# Patient Record
Sex: Male | Born: 1940 | Race: White | Hispanic: No | Marital: Married | Smoking: Former smoker
Health system: Southern US, Community
[De-identification: ages and names within clinical notes are randomized; demographics above are authoritative.]

## PROBLEM LIST (undated history)

## (undated) DIAGNOSIS — D649 Anemia, unspecified: Secondary | ICD-10-CM

## (undated) DIAGNOSIS — Z9289 Personal history of other medical treatment: Secondary | ICD-10-CM

## (undated) DIAGNOSIS — E119 Type 2 diabetes mellitus without complications: Secondary | ICD-10-CM

## (undated) DIAGNOSIS — I4891 Unspecified atrial fibrillation: Secondary | ICD-10-CM

## (undated) DIAGNOSIS — E871 Hypo-osmolality and hyponatremia: Secondary | ICD-10-CM

## (undated) DIAGNOSIS — M1990 Unspecified osteoarthritis, unspecified site: Secondary | ICD-10-CM

## (undated) DIAGNOSIS — I219 Acute myocardial infarction, unspecified: Secondary | ICD-10-CM

## (undated) DIAGNOSIS — I10 Essential (primary) hypertension: Secondary | ICD-10-CM

## (undated) DIAGNOSIS — I2119 ST elevation (STEMI) myocardial infarction involving other coronary artery of inferior wall: Secondary | ICD-10-CM

## (undated) DIAGNOSIS — I5033 Acute on chronic diastolic (congestive) heart failure: Secondary | ICD-10-CM

## (undated) DIAGNOSIS — I2510 Atherosclerotic heart disease of native coronary artery without angina pectoris: Secondary | ICD-10-CM

## (undated) DIAGNOSIS — E785 Hyperlipidemia, unspecified: Secondary | ICD-10-CM

## (undated) DIAGNOSIS — I739 Peripheral vascular disease, unspecified: Secondary | ICD-10-CM

## (undated) DIAGNOSIS — N400 Enlarged prostate without lower urinary tract symptoms: Secondary | ICD-10-CM

## (undated) HISTORY — PX: CORONARY ARTERY BYPASS GRAFT: SHX141

## (undated) HISTORY — PX: MELANOMA EXCISION: SHX5266

## (undated) HISTORY — PX: CATARACT EXTRACTION W/ INTRAOCULAR LENS  IMPLANT, BILATERAL: SHX1307

## (undated) HISTORY — PX: ROTATOR CUFF REPAIR: SHX139

## (undated) HISTORY — DX: Hypo-osmolality and hyponatremia: E87.1

## (undated) HISTORY — PX: CORONARY ANGIOPLASTY WITH STENT PLACEMENT: SHX49

## (undated) HISTORY — DX: Essential (primary) hypertension: I10

## (undated) HISTORY — DX: Atherosclerotic heart disease of native coronary artery without angina pectoris: I25.10

## (undated) HISTORY — DX: Hyperlipidemia, unspecified: E78.5

## (undated) HISTORY — DX: Unspecified atrial fibrillation: I48.91

## (undated) HISTORY — PX: TONSILLECTOMY: SUR1361

## (undated) HISTORY — DX: Peripheral vascular disease, unspecified: I73.9

---

## 2000-09-04 ENCOUNTER — Ambulatory Visit (HOSPITAL_COMMUNITY): Admission: RE | Admit: 2000-09-04 | Discharge: 2000-09-04 | Payer: Self-pay | Admitting: Cardiology

## 2003-08-26 ENCOUNTER — Encounter (HOSPITAL_COMMUNITY): Admission: RE | Admit: 2003-08-26 | Discharge: 2003-09-25 | Payer: Self-pay | Admitting: Cardiology

## 2003-09-26 ENCOUNTER — Inpatient Hospital Stay (HOSPITAL_BASED_OUTPATIENT_CLINIC_OR_DEPARTMENT_OTHER): Admission: RE | Admit: 2003-09-26 | Discharge: 2003-09-26 | Payer: Self-pay | Admitting: Cardiology

## 2003-10-01 ENCOUNTER — Encounter (HOSPITAL_COMMUNITY): Admission: RE | Admit: 2003-10-01 | Discharge: 2003-10-31 | Payer: Self-pay | Admitting: Cardiology

## 2003-11-03 ENCOUNTER — Encounter (HOSPITAL_COMMUNITY): Admission: RE | Admit: 2003-11-03 | Discharge: 2003-12-03 | Payer: Self-pay | Admitting: Cardiology

## 2003-11-24 ENCOUNTER — Inpatient Hospital Stay (HOSPITAL_COMMUNITY): Admission: RE | Admit: 2003-11-24 | Discharge: 2003-11-28 | Payer: Self-pay | Admitting: Surgery

## 2003-12-29 ENCOUNTER — Encounter (HOSPITAL_COMMUNITY): Admission: RE | Admit: 2003-12-29 | Discharge: 2004-01-28 | Payer: Self-pay | Admitting: Cardiology

## 2004-01-30 ENCOUNTER — Encounter (HOSPITAL_COMMUNITY): Admission: RE | Admit: 2004-01-30 | Discharge: 2004-02-29 | Payer: Self-pay | Admitting: Cardiology

## 2004-03-01 ENCOUNTER — Encounter (HOSPITAL_COMMUNITY): Admission: RE | Admit: 2004-03-01 | Discharge: 2004-03-27 | Payer: Self-pay | Admitting: Cardiology

## 2004-03-31 ENCOUNTER — Encounter (HOSPITAL_COMMUNITY): Admission: RE | Admit: 2004-03-31 | Discharge: 2004-04-30 | Payer: Self-pay | Admitting: Cardiology

## 2004-05-03 ENCOUNTER — Encounter (HOSPITAL_COMMUNITY): Admission: RE | Admit: 2004-05-03 | Discharge: 2004-06-02 | Payer: Self-pay | Admitting: Cardiology

## 2004-06-04 ENCOUNTER — Encounter (HOSPITAL_COMMUNITY): Admission: RE | Admit: 2004-06-04 | Discharge: 2004-07-04 | Payer: Self-pay | Admitting: Cardiology

## 2009-07-22 ENCOUNTER — Ambulatory Visit (HOSPITAL_COMMUNITY): Admission: RE | Admit: 2009-07-22 | Discharge: 2009-07-22 | Payer: Self-pay | Admitting: General Surgery

## 2009-07-30 ENCOUNTER — Ambulatory Visit: Payer: Self-pay | Admitting: Oncology

## 2009-11-11 ENCOUNTER — Ambulatory Visit: Payer: Self-pay | Admitting: Cardiology

## 2010-02-04 ENCOUNTER — Ambulatory Visit: Payer: Self-pay | Admitting: Oncology

## 2010-05-21 ENCOUNTER — Ambulatory Visit (INDEPENDENT_AMBULATORY_CARE_PROVIDER_SITE_OTHER): Payer: 59 | Admitting: Cardiology

## 2010-05-21 DIAGNOSIS — E119 Type 2 diabetes mellitus without complications: Secondary | ICD-10-CM

## 2010-05-21 DIAGNOSIS — I10 Essential (primary) hypertension: Secondary | ICD-10-CM

## 2010-05-21 DIAGNOSIS — E7800 Pure hypercholesterolemia, unspecified: Secondary | ICD-10-CM

## 2010-05-21 DIAGNOSIS — I2510 Atherosclerotic heart disease of native coronary artery without angina pectoris: Secondary | ICD-10-CM

## 2010-05-27 ENCOUNTER — Other Ambulatory Visit: Payer: Self-pay | Admitting: Dermatology

## 2010-06-10 ENCOUNTER — Other Ambulatory Visit (INDEPENDENT_AMBULATORY_CARE_PROVIDER_SITE_OTHER): Payer: 59

## 2010-06-10 DIAGNOSIS — E789 Disorder of lipoprotein metabolism, unspecified: Secondary | ICD-10-CM

## 2010-06-15 LAB — GLUCOSE, CAPILLARY
Glucose-Capillary: 170 mg/dL — ABNORMAL HIGH (ref 70–99)
Glucose-Capillary: 178 mg/dL — ABNORMAL HIGH (ref 70–99)

## 2010-06-15 LAB — BASIC METABOLIC PANEL
BUN: 18 mg/dL (ref 6–23)
Calcium: 9.5 mg/dL (ref 8.4–10.5)
GFR calc Af Amer: >60 mL/min (ref >60)
GFR calc non Af Amer: >60 mL/min (ref >60)
Potassium: 4.4 mEq/L (ref 3.5–5.1)

## 2010-06-15 LAB — CBC
HCT: 34 % — ABNORMAL LOW (ref 39.0–52.0)
MCHC: 35.8 g/dL (ref 30.0–36.0)
Platelets: 164 10*3/uL (ref 150–400)

## 2010-07-28 ENCOUNTER — Other Ambulatory Visit: Payer: Self-pay | Admitting: Cardiology

## 2010-07-28 NOTE — Telephone Encounter (Signed)
Medication Refill

## 2010-08-13 NOTE — Cardiovascular Report (Signed)
[name], Kyle NO.:  [id number]   MEDICAL RECORD NO.:  [id number]                   PATIENT TYPE:  OIB   LOCATION:  6598                                 FACILITY:  [facility]   PHYSICIAN:  Peter M. Swaziland, M.D.               DATE OF BIRTH:  30-Oct-1940   DATE OF PROCEDURE:  09/26/2003  DATE OF DISCHARGE:  09/26/2003                              CARDIAC CATHETERIZATION   INDICATION FOR PROCEDURE:  Mr. Staebell is a 70 year old white male who is  status post coronary artery bypass graft 18 years ago. He had previous  documented occlusion of vein graft, sequentially from the diagonal to the  first marginal branch.  He also had had occluded vein graft to the right  coronary artery.  He recently presented in [place], [place] with an  acute myocardial infarction in cardiogenic shock related to left main  occlusion.  The LMA graft to the LAD was still patent.  His left main was  emergently stented with subsequent improvement and stabilization.  The left  main coronary artery __________ 2 diagonal branches which subsequently  provide collateral support to the circumflex and right coronary artery.  The  patient is now seen for follow up.   ACCESS:  Access is via the right femoral artery using standard Seldinger  technique.   EQUIPMENT USED:  A 4 French, 3.5 cm left Judkins catheter, a 4 cm right  Judkins catheter, and a pigtail catheter.   CONTRAST:  150 cc of Omnipaque.   HEMODYNAMIC DATA:  1. Aortic pressure is 133/60 with a mean of 88 mmHg.  2. Left ventricle pressure is 133 with an EDP of 19 mmHg.   ANGIOGRAPHIC DATA:  1. The left coronary artery arises and distributes normally.  2. The left main coronary artery has a stent in place and is widely patent.  3. The left anterior descending artery has an 80-90% stenosis in the     proximal vessel between the first and second diagonal branches.  It has     been occluded in the midvessel.  The  mid-to-distal LAD filled by the IMA     graft.  The first diagonal has a 90% stenosis at the ostium.  4. The left circumflex coronary artery is occluded proximally.  There is a     large first marginal vessel that fills by left collaterals.  There was 2     small, distal marginal vessels that fill by right-to-left collaterals.  5. The right coronary artery is occluded proximally.  The mid-to-distal     right coronary artery fills by left-to-right collaterals.  6. The LMA graft to the LAD is widely patent with excellent distal run off.  7. We did not attempt to locate the previous vein grafts as these are     already documented to be occluded.   LEFT VENTRICULAR ANGIOGRAPHY:  Left ventricular angiography was performed  in  the RAO and LAO cranial views.  This demonstrates normal left ventricular  size. There is mild proximal anterior hypokinesia.  There is severe mid-to-  basal inferior wall hypokinesia and moderate focal lateral wall hypokinesia.  The overall ejection fraction is estimated at 35-40%.  There is no  significant mitral insufficiency.   FINAL INTERPRETATION:  1. Severe 3-vessel obstructive atherosclerotic coronary artery disease.  2. Patent left main coronary artery (LMA) graft to the left anterior     descending artery (LAD).  3. Patent stent to the left main coronary artery.  4. Moderate left ventricular dysfunction.   PLAN:  There is still significant obstructive disease subtending the 2  diagonal branches which also, in turn, supply collaterals to the left  circumflex and right coronary artery.  These vessels appear to have good  collateral flow and appear to be of suitable size for grafting.  For long-  term management I would recommend redo CABG.                                               Peter M. Swaziland, M.D.    PMJ/MEDQ  D:  09/26/2003  T:  09/28/2003  Job:  [id number]   cc:   Evelene Croon, M.D.  [address]  [place]  [place] [address]  Fax: [phone]

## 2010-08-13 NOTE — Op Note (Signed)
[name], [name]                          ACCOUNT NO.:  [id number]   MEDICAL RECORD NO.:  [id number]                   PATIENT TYPE:  INP   LOCATION:  2305                                 FACILITY:  [facility]   PHYSICIAN:  Evelene Croon, M.D.                  DATE OF BIRTH:  January 15, 1941   DATE OF PROCEDURE:  11/24/2003  DATE OF DISCHARGE:                                 OPERATIVE REPORT   PREOPERATIVE DIAGNOSIS:  Severe native three vessel coronary artery disease  with saphenous vein graft occlusion, status post coronary artery bypass  graft surgery in 1987.   POSTOPERATIVE DIAGNOSIS:  Severe native three vessel coronary artery disease  with saphenous vein graft occlusion, status post coronary artery bypass  graft surgery in 1987.   PROCEDURE:  Redo median sternotomy, extracorporeal circulation.  Redo  coronary artery bypass graft surgery x3 using a free right internal mammary  artery graft to the obtuse marginal branch of the left circumflex coronary  artery, with saphenous vein graft to diagonal branch of the LAD and  saphenous vein graft to posterior descending coronary artery.  Endoscopic  vein harvesting from the right leg.   SURGEON:  Evelene Croon, M.D.   ASSISTANT:  Coral Ceo, P.A.C.   ANESTHESIA:  General endotracheal anesthesia.   CLINICAL HISTORY:  This patient is a 70 year old gentleman well-hydrated  underwent four vessel coronary bypass by Dr. Rhea Bleacher in [place], [place], in February of 1987.  He has had previous documented occlusion of  a sequential vein graft to the diagonal and first marginal branch.  There  was a patent segment between the diagonal and marginal.  He also had an  occluded vein graft to the right coronary artery.  He was treated in  [place], [place], in early May of 2005, with an acute myocardial  infarction in cardiogenic shock and was found to have a left main occlusion.  His left internal mammary artery graft to the  LAD was patent.  He was  treated emergently with stenting of his left main coronary artery with  improvement and stabilization. This did restore flow to diseased diagonals  and provided collateral flow to the left circumflex and right coronary  arteries.  At the time of his myocardial infarction, he had severe left  ventricular dysfunction with an EF of 30% with akinesis of the  posterolateral wall and inferior wall.  There was 2 to 3+ moderate to severe  mitral regurgitation and 1+ tricuspid regurgitation.  There was no aortic  valve disease.  He is subsequently improved markedly.  He underwent repeat  cardiac catheterization on September 26, 2003.  This showed a patent stent in the  left main coronary artery.  The LAD had 80 to 90% proximal stenosis between  the first and second diagonal branches and an occlusion of the mid vessel.  The mid to distal LAD filled by the  left internal mammary artery graft.  The  first diagonal had about 90% osteal stenosis.  The left circumflex was  occluded proximally and a large marginal filling by collaterals.  The right  coronary artery was occluded proximally with the mid to distal vessel  filling by left-to-right collaterals.  There was no gradient across the  aortic valve and no significant mitral regurgitation.  End diastolic  pressure was 19. Ejection fraction was 35 to 40% with severe mid to basilar  inferior wall hypokinesis and moderate lateral wall hypokinesis.  After  review of the angiogram and examination of the patient, it was felt that  redo coronary artery bypass graft surgery was the best treatment.  I  discussed the operative procedure with the patient including alternatives,  benefits, and risks, including bleeding, blood transfusion, infection,  stroke, myocardial infarction, graft failure and death.  He understood and  agreed to proceed.   DESCRIPTION OF PROCEDURE:  The patient was taken to the operating room and  placed on the table in  the supine position.  After induction of general  endotracheal anesthesia, a Foley catheter was placed in the bladder using  sterile technique.  Then the chest, abdomen and both lower extremities were  prepped and draped in the usual sterile manner.  The chest was entered  through a median sternotomy incision.  The sternum was opened using the  oscillating saw without difficulty.  Upon opening the sternum, it was  apparent that the left internal mammary artery graft swung over almost to  the midline and was lying immediately under the edge of the divided left  side of the sternum.  It was not injured on sternotomy.  Care was taken to  protect the left mammary artery.   Then the right internal mammary artery graft was harvested as a free graft.  This was a medium caliber vessel with excellent blood flow through it.  At  the same time, a segment of greater segment saphenous vein was harvested  from the right leg using endoscopic vein harvest technique.  This vein was a  medium size and good quality.  Then dissection was performed to expose the  right atrium and ascending aorta.  The patient was heparinized and when an  adequate activated clotting time was achieved, the distal ascending aorta  was cannulated using a 20 French aortic cannula for arterial inflow.  Venous  outflow was achieved using a two-stage venous cannula for the right atrial  appendage.  Antegrade cardioplegia and vent cannula was inserted in the  aortic root.  A retrograde cardioplegia cannula was inserted through the  right atrium and the coronary sinus.   The patient was placed on cardiopulmonary bypass and the remainder of the  heart was dissected from the pericardium.  He had moderately dense  adhesions.  The left internal mammary pedicle was identified as it entered  the pericardial cavity.  It was traced backwards up to the sternum.   Then the aorta was crossclamped and 500 mL of cold blood antegrade cardioplegia  was administered in the aortic root with quick arrest of the  heart.  An atraumatic clamp was placed across an aortic pedicle.  This was  followed by 300 mL of cold blood retrograde cardioplegia.  Systemic  hypothermia to 20 degrees C and topical hypothermia __________ was used.  A  temperature probe was placed in the septum and insulating pad in the  pericardium.  The coronary arteries were then identified.  The obtuse  marginal branch was located just beyond the previous vein graft distal  anastomosis.  This vessel was graftable but was relatively small and had  some distal disease in it.  It was lying deep in a trough of epicardial fat  which made it more difficult to expose.  The first diagonal branch was also  located and was small but graftable vessel.  Again, this was lying in a  trough of epicardial fat that was difficult to expose.  The posterior  descending artery was also visualized and was a small to medium size vessel  and suitable for grafting.   Then the first distal anastomosis was performed to the posterior descending  coronary artery.  The internal diameter of this vessel was about 1.6 mm.  The conduit used was the segment of greater saphenous vein.  The anastomosis  performed in end-to-side manner using continuous 7-0 Prolene suture.  Flow  was noticed in the graft and was excellent.   The second distal anastomosis was performed of the obtuse marginal branch.  The internal diameter of this vessel was about 1.6 mm.  Conduit used was the  free right internal mammary artery graft.  This was anastomosis in end-to-  side manner using continuous 8-0 Prolene suture.  The pedicle was tacked to  the epicardium with 6-0 Prolene sutures.  Then the dose of retrograde  cardioplegia was given.   The third distal anastomosis was performed to the diagonal branch.  The  internal diameter was about 1.5 mm.  Conduit used was a second segment of  greater saphenous vein.  The anastomosis  performed in end-to-side manner  using continuous 7-0 Prolene suture.  Flow was noted through the graft and  was good.  Then the two proximal vein graft anastomoses were performed to  the aortic root in end-to-side manner using continuous 6-0 Prolene suture.  The proximal anastomosis of the right internal mammary artery graft was  performed to the proximal portion of the diagonal vein graft in end-to-side  manner using continuous 7-0 Prolene suture.  Then the clamp was removed from  the mammary pedicle.  There was rapid warming of the ventricular septum and  returned spontaneous ventricular fibrillation.  The crossclamp was removed  with time of 124 minutes and the patient spontaneously converted to sinus  rhythm.   The proximal and distal anastomoses appeared hemostatic and alignment of the  graft satisfactory.  Graft markers placed around the proximal anastomosis.  Two temporary right ventricular and right atrial pacing wires placed and  brought out through the skin.  When the patient had rewarmed to 37 degrees C, he was weaned from  cardiopulmonary bypass on low dose Dopamine.  Total bypass time was 168  minutes.  Cardiac function was excellent with cardiac output of about 5  liters per minute.  Protamine was given and venous and aortic cannulas were  removed without difficulty.  Hemostasis was achieved.  Three chest tubes  were placed with a tube in the posterior pericardium and one in the right  pleural space and one in the anterior mediastinum.  The sternum was closed  with #6 stainless steel wires.  Fascia was closed with continuous #1 Vicryl  suture.  Subcutaneous tissue was closed with a continuous 2-0 Vicryl and the  skin with 3-0 Vicryl subcuticular closure.  The lower extremity vein harvest  site was closed in layers in similar manner.  The sponge, needle and  instrument counts were correct according to the scrub nurse.  Dry sterile  dressings were applied over the incisions  around the chest tubes which were  hooked to Pleuravac suction.  The patient remained hemodynamically stable  and was transported to the SICU in guarded but stable condition.                                               Evelene Croon, M.D.    BB/MEDQ  D:  11/24/2003  T:  11/24/2003  Job:  [id number]   cc:   Peter M. Swaziland, M.D.  [address], [address]  [place], [place] [address]  Fax: [phone]   Cardiac Cath Lab

## 2010-08-13 NOTE — H&P (Signed)
NAME:  [name], [name]                          ACCOUNT NO.:  [id number]   MEDICAL RECORD NO.:  [id number]                   PATIENT TYPE:  INP   LOCATION:  NA                                   FACILITY:  [facility]   PHYSICIAN:  Evelene Croon, M.D.                  DATE OF BIRTH:  16-Oct-1940   DATE OF ADMISSION:  11/24/2003  DATE OF DISCHARGE:                                HISTORY & PHYSICAL   HISTORY OF PRESENT ILLNESS:  This is a 70 year old male who was referred to  Dr. Laneta Simmers in consideration for redo coronary artery bypass grafting.  The  patient has a history of a four-vessel coronary artery bypass by Dr.  __________ in [place], [place], in February of 1987.  Previously  there is documentation of an occlusion to the sequential vein graft to the  diagonal and first marginal branch.  There is a patent segment between the  diagonal and the marginal.  He additionally is known to have an occlusion of  the vein graft to his right coronary artery.  In May of 2005, the patient  presented in [place], [place], with an acute myocardial infarction  with cardiogenic shock and studies done at that time revealed an acute left  main occlusion.  His mammary graft to the LAD was patent.  He was stabilized  medically and additionally he underwent a stenting of his left main coronary  artery with improvement.  At the time of his myocardial infarction, an  echocardiogram revealed a significantly impaired left ventricular function  with an ejection fraction of 30% with akinesis of the posterolateral wall  and inferior wall akinesis.  Additionally at that time, 2-3+ moderate to  severe mitral regurgitation and 1+ tricuspid regurgitation were noted.  There was no aortic valve disease.  The patient had a good clinical recovery  and has had no recurrent chest pain.  He does have symptoms of some  shortness of breath with deep lower levels of activity tolerance.  The  patient feels  increasingly fatigued.  In July of 2005, the patient underwent  a redo cardiac catheterization and was found to have a patent stent in the  left main coronary artery.  The LAD had an 80-90% proximal stenosis between  the first and second diagonal branches and then occlusion in the mid vessel.  The mid to distal LAD filled by the left mammary artery graft.  The first  diagonal had about a 90% ostial stenosis.  The circumflex was occluded  proximally.  There was a large first marginal filled by collaterals.  The  right coronary artery was occluded proximally and the mid to distal filled  by left-to-right collaterals, but was relatively small or underfilled.  There was no gradient across his aortic valve.  There was no significant  mitral regurgitation.  End-diastolic pressure was 19.  His ejection fraction  was estimated at  35-40% with severe mid to basilar inferior wall hypokinesis  and moderate lateral wall hypokinesis.  Dr. Laneta Simmers reviewed the patient and  his studies and agreed to proceed with surgical revascularization due to the  increasing symptoms and severity of the anatomical findings.   PAST MEDICAL HISTORY:  1. Coronary artery disease as described.  2. Hypertension.  3. Hypercholesterolemia.  4. Acute myocardial infarction in May of 2005 with cardiogenic shock.  5. Diabetes mellitus, type 2.  6. History of depression diagnosed in 1995 without hospitalization.  7. History of benign prostatic hypertrophy.   PAST SURGICAL HISTORY:  1. Right side rotator cuff repair.  2. Coronary artery bypass grafting x 4 in 1987.  3. Tonsillectomy age 47.   CURRENT MEDICATIONS:  1. Plavix 75 mg daily.  Discontinued on November 17, 2003.  2. Avandia 4 mg b.i.d.  3. Prandin 2 mg t.i.d.  4. Zoloft 100 mg daily.  5. Zocor 40 mg daily.  6. Toprol XL 50 mg daily.  7. NitroQuick p.r.n.  8. Xanax 0.25 mg one to two daily.  9. Aspirin 325 mg daily.   ALLERGIES:  No known drug allergies.    FAMILY HISTORY:  Remarkable for coronary artery disease from his mother.  Also, multiple family members with diabetes and arthritis.   SOCIAL HISTORY:  He is married with no children.  He is a retired Proofreader.  Alcohol use:  None.  Tobacco use:  Approximately 45  pack years.  He quit in 1987.   REVIEW OF SYSTEMS:  GENERAL:  Unremarkable.  ENDOCRINE:  Unremarkable with  the exception of diabetes.  CARDIOVASCULAR:  As described above.  PULMONARY:  He denies symptoms other than mild shortness of breath with exertion.  PERIPHERAL VASCULAR:  He does have leg and buttock pain when walking,  relieved with rest.  NEUROLOGIC:  Nonfocal.  MUSCULOSKELETAL:  Remarkable  for some arthritis symptoms with some pain and swelling in his joints.  PSYCHIATRIC:  He does have a history of depression.  HEMATOLOGICAL:  He does  not have any evidence of easy bruising or bleeding diastasis.   PHYSICAL EXAMINATION:  VITAL SIGNS:  Blood pressure 110/70, pulse 76,  respirations 12.  GENERAL APPEARANCE:  This is a well-developed male in no acute distress.  HEENT:  Normocephalic and atraumatic.  Pupils equal, round, and reactive to  light.  Extraocular movements intact.  The pharynx is clear without exudates  or erythema.  NECK:  Supple.  Carotid pulses are palpable.  There are bilateral faint  bruits versus transmitted murmurs.  No adenopathy or thyromegaly.  CARDIAC:  Regular rate and rhythm with occasional extrasystole.  There is a  2/6 systolic murmur.  No rubs or gallops.  ABDOMEN:  Soft and nontender.  Normoactive bowel sounds.  No masses.  No  bruits.  GENITOURINARY:  Deferred.  RECTAL:  Deferred.  EXTREMITIES:  No clubbing or cyanosis.  There is mild left ankle edema.  SKIN:  Warm.  PERIPHERAL PULSES:  Palpable posterior tibial bilaterally.  NEUROLOGIC:  Nonfocal.  Muscle strength and sensory are grossly intact.  ASSESSMENT:  This is a 70 year old male with recurrent native and  graft  coronary artery disease, status post acute myocardial infarction in May of  2005.  Other diagnoses as previously listed.   PLAN:  Redo surgical revascularization by Dr. Laneta Simmers on November 24, 2003.      Rowe Clack, P.A.-C.  Evelene Croon, M.D.    Sherryll Burger  D:  11/20/2003  T:  11/20/2003  Job:  [id number]   cc:   Peter M. Swaziland, M.D.  [address], [address]  [place], [place] [address]  Fax: [phone]

## 2010-08-13 NOTE — H&P (Signed)
[place]  Patient:    [name], [name]                         MRN: [id number] Proc. Date: 08/31/00 Adm. Date:  09/04/00 Attending:  Peter M. Swaziland, M.D. CC:         Bing Neighbors. Tenny Craw, M.D.                         History and Physical  CHIEF COMPLAINT:  Chest pain.  HISTORY OF PRESENT ILLNESS:  The patient is a 70 year old white male, who has a known history of coronary artery disease.  He is status post coronary artery bypass surgery in February of 1987 by Dr. Rhea Bleacher.  At that time he had a LIMA graft placed to the LAD, a sequential saphenous vein graft to the second diagonal and distal circumflex and a saphenous vein graft to the posterior descending coronary arteries.  He has done very well since that time and has been followed with yearly stress test, at least since 1997.  On these tests he has generally had good exercise tolerance and has been asymptomatic.  He has had some mild ST changes over the years.  On this years examination, the patient did report some increased shortness of breath on exertion and occasional chest pain.  He underwent his yearly stress test and this demonstrated marked ischemia in the anterolateral distribution at 6 minutes on the Bruce protocol.  This was associated with chest pain and dyspnea.  This represents a marked change in both his exercise tolerance and ECG findings since one year ago, and he is now admitted for cardiac catheterization.  The patient does have significant cardiac risk factors including a history of non-insulin dependent diabetes mellitus and hypercholesterolemia.  His diabetes apparently has been poorly controlled.  PAST MEDICAL HISTORY: 1. Non-insulin dependent diabetes mellitus. 2. Hypertension. 3. Hypercholesterolemia. 4. He is status post CABG 15 years ago. 5. He has had prior tonsillectomy. 6. The patient also has had rotator cuff tear on the right. 7. History of BPH. 8. History of  depression.  CURRENT MEDICATIONS: 1. Aspirin daily. 2. Glucotrol 10 mg b.i.d. 3. Pravachol 20 mg daily. 4. Hytrin 5 mg daily. 5. Zoloft 100 mg per day. 6. Nitrostat p.r.n. 7. Avandia 4 mg b.i.d.  ALLERGIES:  He has no known allergies.  SOCIAL HISTORY:  The patient is disabled.  He is married and has 2 stepchildren.  He quit smoking 15 years ago and denies significant alcohol use.  FAMILY HISTORY:  Noncontributory.  REVIEW OF SYSTEMS:  He denies any claudication symptoms.  No history of TIA or stroke.  Denies orthopnea, PND or edema.  All other review of systems are negative.  PHYSICAL EXAMINATION:  GENERAL:  On physical examination, the patient is a white male in no apparent distress.  VITAL SIGNS:  Weight is 186.  Blood pressure is 118/72, pulse 82 and regular.  HEENT:  Pupils, equal, round, reactive to light and accommodation. Extraocular movements are full.  Conjunctivae are clear.  Oropharynx is clear.  NECK:  Supple without JVD, adenopathy, thyromegaly, or bruits.  LUNGS:  Clear to auscultation and percussion.  CARDIAC:  The patient does have soft bilateral carotid bruits, right greater than left.  The patient has normal PMI.  He has a regular rate and rhythm with a grade 2/6 systolic murmur heard best at the left sternal border.  There are  no S3.  ABDOMEN:  Soft and nontender without masses, hepatosplenomegaly or bruits.  PULSES:  Femoral and pedal pulses are 2+ and symmetric.  EXTREMITIES:  He has no edema.  NEUROLOGICAL:  Examination is grossly intact.  LABORATORY DATA:  ECG at rest demonstrates normal sinus rhythm with minor nonspecific ST abnormality.  Chest x-ray shows prior coronary artery bypass surgery with no active disease.  IMPRESSION: 1. Atherosclerotic coronary artery disease with recurrent angina and    early positive stress test. 2. Status post coronary artery bypass graft x4 in 1987. 3. Non-insulin dependent diabetes mellitus, poorly  controlled. 4. Hypercholesterolemia. 5. Hypertension.  PLAN:  The patient will be admitted for cardiac catheterization with further therapy pending these results. DD:  08/31/00 TD:  09/01/00 Job: [id number]

## 2010-08-13 NOTE — Cardiovascular Report (Signed)
[name], [name]                       MRN: [id number] Proc. Date: 09/04/00 Adm. Date:  [id number] Attending:  [place], Peter Manning CC:         Bing Neighbors. Tenny Craw, M.D.   Cardiac Catheterization  INDICATIONS FOR PROCEDURE:  The patient is a 70 year old white male, status post coronary artery bypass surgery 15 years ago.  He has had some recurrent anginal symptoms and has significant change in his stress test this year with increased ST changes at a low level.  ACCESS:  Via the right femoral artery using standard Seldinger technique.  EQUIPMENT:  A 6 French 4 cm right and left Judkins catheter, 6 French pigtail catheter, 6 French arterial sheath.  MEDICATIONS:  Local anesthesia with 1% Xylocaine.  CONTRAST:  Omnipaque 175 cc.  HEMODYNAMIC DATA:  Aortic pressure is 138/71 with a mean of 100.  Left ventricular pressure is 132 with an EDP of 24 mmHg.  ANGIOGRAPHIC DATA:  Left coronary artery:  The left coronary artery arises normally.  Left main:  The left main coronary artery is heavily calcified with diffuse 30% narrowing in the mid to distal left main.  Left anterior descending:  The left anterior descending artery is also heavily calcified.  It gives rise to two diagonal branches proximally and then is occluded.  Left circumflex:  The left circumflex coronary artery is heavily calcified and is occluded proximally.  The first obtuse marginal vessel fills by segmented vein graft which extends from the first diagonal branch to the first marginal vessel.  This also fills retrograde the distal circumflex and second marginal vessel.  The first marginal vessel is diffusely diseased after the vein graft insertion up to 80-90% over a long segment.  The distal circumflex is without significant disease.  The right coronary arises normally and is occluded proximally.  The right coronary artery fills by both right to right and  left to right collaterals. These collaterals come from both the native LAD via IMA graft and via the native circulation by the distal circumflex.  The saphenous vein graft to the first diagonal and first marginal vessel is occluded proximally.  As noted previously, the segment between the first diagonal and first marginal vessel was still patent but the entire proximal segment is occluded.  The saphenous vein graft to the right coronary artery is occluded proximally.  The LIMA graft to the LAD is a large graft which is widely patent and fills the LAD well.  LEFT VENTRICULAR ANGIOGRAPHY:  Left ventricular angiography is performed in the RAO and LAO cranial views.  This demonstrates normal left ventricular size.  There is mild inferobasal hypokinesia with overall well preserved left ventricular function.  Ejection fraction was estimated at 55%.  There is no mitral regurgitation or prolapse.  FINAL INTERPRETATION: 1. Severe three-vessel obstructive atherosclerotic coronary artery disease. 2. Occluded saphenous vein graft to the first diagonal with continued patency    of the graft segment between the first diagonal and first obtuse marginal    vessel filling the distal circumflex. 3. Occluded saphenous vein graft to the right coronary artery. 4. Patent left internal mammary artery graft to left anterior descending. 5. Overall, well preserved left ventricular function.  PLAN:  Would recommended medical therapy at this point.  The patient has poor target vessels for grafting and these areas are well collateralized.  If he has severe refractory angina despite medical therapy, could consider revascularization with a re-do bypass surgery. DD:  09/04/00 TD:  09/04/00 Job: [id number]

## 2010-08-13 NOTE — Discharge Summary (Signed)
[name], [name]                          ACCOUNT NO.:  [id number]   MEDICAL RECORD NO.:  [id number]                   PATIENT TYPE:  INP   LOCATION:  2008                                 FACILITY:  [facility]   PHYSICIAN:  Evelene Croon, M.D.                  DATE OF BIRTH:  04-Nov-1940   DATE OF ADMISSION:  11/24/2003  DATE OF DISCHARGE:  11/28/2003                                 DISCHARGE SUMMARY   PRIMARY ADMITTING DIAGNOSIS:  Recurrent coronary artery disease.   ADDITIONAL/DISCHARGE DIAGNOSES:  1.  Recurrent coronary artery disease, status post previous coronary artery      bypass grafting in 1987.  2.  Hypertension.  3.  Hypercholesterolemia.  4.  Status post myocardial infarction in May 2005 with subsequent      cardiogenic shock.  5.  Type 2 noninsulin-dependent diabetes mellitus.  6.  History of depression.  7.  Benign prostatic hypertrophy.   PROCEDURES PERFORMED:  1.  Redo coronary artery bypass grafting x3 (free right internal mammary      artery to the obtuse marginal, saphenous vein graft to the diagonal,      saphenous vein graft to the posterior descending coronary).  2.  Endoscopic vein harvest, right thigh.   HISTORY:  The patient is a 70 year old male who is status post a previous  coronary artery bypass grafting surgery in 1987 by Dr. Rhea Bleacher.  In early  May of this year he presented to an emergency room in [place], [place], with an acute myocardial infarction as well as cardiogenic shock.  At that time he was felt to have left main occlusion.  The left internal  mammary artery graft to his LAD was still patent.  He was treated emergently  with stenting to his left main coronary with improvement and stabilization.  This restored flow to diseased diagonal vessels and provided collateral flow  to the left circumflex and right coronary arteries.  At that time he was  also noted to have a severe left ventricular dysfunction with an ejection  fraction  by echocardiogram of about 30% with akinesis of the posterolateral  wall and inferior wall akinesis.  There was 2-3+ mitral regurgitation and 1+  tricuspid regurgitation.  Symptomatically he has remained stable since that  time.  He was seen by Peter M. Swaziland, M.D., and underwent repeat cardiac  catheterization on September 26, 2003.  This showed a patent stent in his left  main coronary.  There was an 80-90% proximal stenosis of the LAD between the  first and second diagonals and then occlusion in the midvessel.  The mid- to  distal LAD filled by the left internal mammary artery graft.  The first  diagonal had about a 90% ostial stenosis.  The left circumflex was occluded  proximally.  There was a large first marginal, which filled by collaterals.  The right coronary artery was occluded  proximally and the mid- to vessels  filled by left-to-right collaterals but was relatively small.  There was no  gradient across the aortic valve and no significant mitral regurgitation.  His ejection fraction was estimated at 35-40% with severe mid- to basilar  wall inferior wall hypokinesis and moderate lateral wall hypokinesis.  Because of his significant recurrent coronary artery disease, he was  referred to Dr. Evelene Croon for evaluation for redo surgical  revascularization.  Dr. Laneta Simmers saw the patient in the office and reviewed  his films and agreed that his best course of action would be to proceed with  surgery.  After explanation of the risks, benefits, and alternatives of the  procedure, the patient agreed to proceed.   HOSPITAL COURSE:  He was admitted to [facility] on November 24, 2003.  He was taken to the operating room, where he underwent redo CABG x3  performed by Dr. Laneta Simmers.  Grafts are described in detail above.  He  tolerated the procedure well and was transferred to the SICU in stable  condition.  He was able to be extubated after surgery.  He was  hemodynamically stable and doing  well on postop day 1.  He did have some  bradycardia and required atrial pacing initially.  He was also maintained on  low-dose Neo-Synephrine and dopamine drips.  Over the course of postop day 1  he was weaned from all drips, and these were discontinued.  After he was off  all drips, he was able to be transferred to the floor.  He was able to  maintain normal sinus rhythm with rates in the 80s-90s without pacemaker  back-up.  Therefore, the external pacer was discontinued.  He was started on  a low-dose beta blocker, which he has been tolerating well.  This has been  titrated back up to his home dose of Toprol XL 50 mg daily, and he is  maintaining blood pressures in the 110-120 systolic range with heart rates  in the 80s-90s in normal sinus rhythm.  He has done very well  postoperatively.  He is ambulating in the halls without difficulty.  He has  been weaned off supplemental oxygen and is maintaining O2 saturations of  greater than 90% on room air.  His surgical incision sites are all healing  well.  He is being treated with aggressive pulmonary toilet measures.  He is  only mildly volume-overloaded and is not currently on a diuretic.  His blood  sugars have remained well-controlled on his home medication regimen.  Presently his labs show a mild anemia with hemoglobin of 9.2, hematocrit  26.5, and he has been started on iron supplementation.  His other labs show  a platelet count of 96, white count of 8.8, BUN 23, creatinine 1.1,  potassium 3.9, which has been supplemented.  His most recent chest x-ray  showed bibasilar atelectasis, again which has been treated with aggressive  pulmonary toilet.  He is continuing to make progress, and it is anticipated  that if over the next 24-48 hours he has had no changes in his condition, he  will be ready for discharge home, hopefully on November 28, 2003.   DISCHARGE MEDICATIONS:  1.  Enteric-coated aspirin 325 mg daily. 2.  Toprol XL 50 mg  daily.  3.  Zocor 40 mg q.h.s.  4.  Plavix 75 mg daily.  5.  Avandia 4 mg b.i.d.  6.  Prandin 2 mg t.i.d.  7.  Zoloft 100 mg daily.  8.  Xanax 0.25 mg one to two daily p.r.n.  9.  Nu-Iron 150 mg b.i.d.  10. Tylox one to two q.4h. p.r.n. for pain.   DISCHARGE INSTRUCTIONS:  He is asked to refrain from driving, heavy lifting,  or strenuous activity.  He may continue ambulating daily and using his  incentive spirometer.  He may shower daily and clean his incisions with soap  and water.  He will continue on a low-fat, low-sodium, carbohydrate-modified  diet.   DISCHARGE FOLLOW-UP:  He is asked to make an appointment to see Dr. Swaziland  in two weeks and have a chest x-ray at that visit.  He will then follow up  with Dr. Laneta Simmers on September 20 at 12:15 p.m.  He will bring his chest x-ray  to this visit for Dr. Laneta Simmers to review.  If he experiences any problems or  has questions in the interim, he is asked to contact our office immediately.      Coral Ceo, P.A.                        Evelene Croon, M.D.    GC/MEDQ  D:  11/27/2003  T:  11/29/2003  Job:  [id number]   cc:   C. Duane Lope, M.D.  [address]  [place]  [place] [address]  Fax: [phone]   Peter M. Swaziland, M.D.  [phone]  [place], [place] [address]  Fax: [phone]

## 2010-08-27 ENCOUNTER — Other Ambulatory Visit: Payer: Self-pay | Admitting: Dermatology

## 2010-08-31 ENCOUNTER — Ambulatory Visit (INDEPENDENT_AMBULATORY_CARE_PROVIDER_SITE_OTHER): Payer: 59 | Admitting: Urology

## 2010-08-31 DIAGNOSIS — N529 Male erectile dysfunction, unspecified: Secondary | ICD-10-CM

## 2010-08-31 DIAGNOSIS — N401 Enlarged prostate with lower urinary tract symptoms: Secondary | ICD-10-CM

## 2010-08-31 DIAGNOSIS — N138 Other obstructive and reflux uropathy: Secondary | ICD-10-CM

## 2010-10-26 ENCOUNTER — Encounter (HOSPITAL_BASED_OUTPATIENT_CLINIC_OR_DEPARTMENT_OTHER): Payer: 59 | Admitting: Oncology

## 2010-10-26 DIAGNOSIS — C434 Malignant melanoma of scalp and neck: Secondary | ICD-10-CM

## 2010-11-25 ENCOUNTER — Other Ambulatory Visit: Payer: Self-pay | Admitting: Cardiology

## 2010-11-25 NOTE — Telephone Encounter (Signed)
Fax received from pharmacy. Refill completed. Jodette Briley RN  

## 2011-02-20 ENCOUNTER — Other Ambulatory Visit: Payer: Self-pay | Admitting: Cardiology

## 2011-02-22 ENCOUNTER — Other Ambulatory Visit: Payer: Self-pay | Admitting: Cardiology

## 2011-02-23 ENCOUNTER — Encounter: Payer: Self-pay | Admitting: Cardiology

## 2011-02-23 ENCOUNTER — Ambulatory Visit (INDEPENDENT_AMBULATORY_CARE_PROVIDER_SITE_OTHER): Payer: Medicare Other | Admitting: Cardiology

## 2011-02-23 VITALS — BP 118/60 | HR 66 | Ht 70.0 in | Wt 188.8 lb

## 2011-02-23 DIAGNOSIS — E785 Hyperlipidemia, unspecified: Secondary | ICD-10-CM | POA: Insufficient documentation

## 2011-02-23 DIAGNOSIS — I2510 Atherosclerotic heart disease of native coronary artery without angina pectoris: Secondary | ICD-10-CM

## 2011-02-23 DIAGNOSIS — I10 Essential (primary) hypertension: Secondary | ICD-10-CM | POA: Insufficient documentation

## 2011-02-23 DIAGNOSIS — I252 Old myocardial infarction: Secondary | ICD-10-CM | POA: Insufficient documentation

## 2011-02-23 DIAGNOSIS — E119 Type 2 diabetes mellitus without complications: Secondary | ICD-10-CM

## 2011-02-23 DIAGNOSIS — Z951 Presence of aortocoronary bypass graft: Secondary | ICD-10-CM | POA: Insufficient documentation

## 2011-02-23 NOTE — Assessment & Plan Note (Signed)
He remains asymptomatic from a cardiac standpoint. We will continue on his medical therapy including aspirin, carvedilol, and Plavix. His last nuclear stress test in August of 2010 showed an inferior lateral wall scar with ejection fraction of 53%.

## 2011-02-23 NOTE — Patient Instructions (Signed)
Continue your current medication.  I will get a copy of your lab work from Dr. Tenny Craw.  I will see you again in 6 months.

## 2011-02-23 NOTE — Assessment & Plan Note (Signed)
Lipids are well controlled on a combination of fish oil, niacin, and simvastatin.

## 2011-02-23 NOTE — Progress Notes (Signed)
[name] Date of Birth: [birth year] Medical Record #[id number]  History of Present Illness: [name] is seen today for followup. He has a history of coronary disease and is status post redo coronary bypass surgery in 2005. He has had an old anterior myocardial infarction. He reports that he has been doing very well from a cardiac standpoint. He has had no significant shortness of breath, chest pain, or palpitations. He did recently fall off a ladder and this resulted in significant bruising of his hip and he has had some persistent back pain. This has limited his activity recently.  Current Outpatient Prescriptions on File Prior to Visit  Medication Sig Dispense Refill  . aspirin 81 MG tablet Take 81 mg by mouth daily.        Marland Kitchen buPROPion (WELLBUTRIN XL) 300 MG 24 hr tablet Take 300 mg by mouth daily.        . carvedilol (COREG) 12.5 MG tablet TAKE 1 TABLET TWICE A DAY  180 tablet  2  . fish oil-omega-3 fatty acids 1000 MG capsule Take 2 g by mouth daily.        Marland Kitchen FLUoxetine (PROZAC) 20 MG capsule Take 20 mg by mouth daily.        Marland Kitchen losartan-hydrochlorothiazide (HYZAAR) 100-12.5 MG per tablet Take 1 tablet by mouth daily.        . Multiple Vitamin (MULTIVITAMIN) tablet Take 1 tablet by mouth daily.        . niacin 500 MG tablet Take 500 mg by mouth daily with breakfast.        . NITROSTAT 0.4 MG SL tablet TAKE AS DIRECTED  25 tablet  3  . PLAVIX 75 MG tablet TAKE 1 TABLET EVERY DAY  90 tablet  3  . simvastatin (ZOCOR) 80 MG tablet Take 80 mg by mouth at bedtime.          Not on File  Past Medical History  Diagnosis Date  . CAD (coronary artery disease)   . History of acute anterior wall MI   . Diabetes mellitus   . HTN (hypertension)   . Dyslipidemia   . 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 Coronary artery bypass graft redo 2005    free Rima to OM, svg-diag,svg-pda  . Tonsillectomy   . Rotator cuff repair   . Melanoma  excision     History  Smoking status  . Former Smoker  . Quit date: 02/22/1986  Smokeless tobacco  . Not on file    History  Alcohol Use: Not on file    History reviewed. No pertinent family history.  Review of Systems: As noted in history of present illness.  All other systems were reviewed and are negative.  Physical Exam: BP 118/60  Pulse 66  Ht 5\' 10"  (1.778 m)  Wt 188 lb 12.8 oz (85.639 kg)  BMI 27.09 kg/m2 He is a pleasant white male in no acute distress.The patient is alert and oriented x 3.  The mood and affect are normal.  The skin is warm and dry.  Color is normal.  The HEENT exam reveals that the sclera are nonicteric.  The mucous membranes are moist.  The carotids are 2+ without bruits.  There is no thyromegaly.  There is no JVD.  The lungs are clear.  The chest wall is non tender.  The heart exam reveals a regular rate with a normal S1 and S2.  There is a grade 1-2/6 systolic ejection murmur the left sternal border.  The PMI is not displaced.   Abdominal exam reveals good bowel sounds.  There is no guarding or rebound.  There is no hepatosplenomegaly or tenderness.  There are no masses.  Exam of the legs reveal no clubbing, cyanosis, or edema.  The legs are without rashes.  The distal pulses are intact.  Cranial nerves II - XII are intact.  Motor and sensory functions are intact.  The gait is normal.  LABORATORY DATA: Blood work dated 02/22/2011 included an A1c of 7.7%. In May of 2012 his total cholesterol is 110, triglycerides 53, HDL 57, and LDL of 30. Chemistries were remarkable for a potassium of 5.8. ECG today demonstrates normal sinus rhythm with an old inferior infarction. He has LVH with repolarization abnormality.  Assessment / Plan:

## 2011-03-02 ENCOUNTER — Other Ambulatory Visit: Payer: Self-pay | Admitting: Dermatology

## 2011-03-11 ENCOUNTER — Ambulatory Visit (INDEPENDENT_AMBULATORY_CARE_PROVIDER_SITE_OTHER): Payer: Medicare Other | Admitting: Urology

## 2011-03-11 DIAGNOSIS — N138 Other obstructive and reflux uropathy: Secondary | ICD-10-CM

## 2011-03-11 DIAGNOSIS — N401 Enlarged prostate with lower urinary tract symptoms: Secondary | ICD-10-CM

## 2011-03-11 DIAGNOSIS — N529 Male erectile dysfunction, unspecified: Secondary | ICD-10-CM

## 2011-03-11 DIAGNOSIS — R351 Nocturia: Secondary | ICD-10-CM

## 2011-04-16 ENCOUNTER — Telehealth: Payer: Self-pay | Admitting: Oncology

## 2011-04-16 NOTE — Telephone Encounter (Signed)
Mailed the pt his April 2013 appt calendar °

## 2011-06-08 ENCOUNTER — Telehealth: Payer: Self-pay | Admitting: Oncology

## 2011-06-08 NOTE — Telephone Encounter (Signed)
called pts home lmovm that his appt on 04/04 was r/s to 04/12 and to rtn call toconfirm appt d/t

## 2011-06-10 ENCOUNTER — Telehealth: Payer: Self-pay | Admitting: Oncology

## 2011-06-10 NOTE — Telephone Encounter (Signed)
pt called lmovm that he will not be abale to come on 4-12 rtn call to pt to call and r/s

## 2011-06-30 ENCOUNTER — Ambulatory Visit: Payer: Medicare Other | Admitting: Oncology

## 2011-07-08 ENCOUNTER — Ambulatory Visit: Payer: Medicare Other | Admitting: Oncology

## 2011-07-19 ENCOUNTER — Other Ambulatory Visit: Payer: Self-pay | Admitting: *Deleted

## 2011-07-19 MED ORDER — CLOPIDOGREL BISULFATE 75 MG PO TABS: 75.0000 mg | ORAL_TABLET | Freq: Every day | ORAL | Status: DC

## 2011-07-19 MED ORDER — LOSARTAN POTASSIUM-HCTZ 100-12.5 MG PO TABS: 1.0000 | ORAL_TABLET | Freq: Every day | ORAL | Status: DC

## 2011-07-20 ENCOUNTER — Encounter: Payer: Medicare Other | Admitting: Oncology

## 2011-07-20 ENCOUNTER — Ambulatory Visit: Payer: Medicare Other | Admitting: Nurse Practitioner

## 2011-07-26 ENCOUNTER — Telehealth: Payer: Self-pay | Admitting: Oncology

## 2011-07-26 NOTE — Telephone Encounter (Signed)
called pt to r/s appt and pt stated that he will not be rtn to our office

## 2011-08-18 ENCOUNTER — Encounter: Payer: Self-pay | Admitting: Cardiology

## 2011-08-18 ENCOUNTER — Ambulatory Visit (INDEPENDENT_AMBULATORY_CARE_PROVIDER_SITE_OTHER): Payer: Medicare Other | Admitting: Cardiology

## 2011-08-18 VITALS — BP 140/80 | HR 73 | Ht 70.0 in | Wt 178.0 lb

## 2011-08-18 DIAGNOSIS — E785 Hyperlipidemia, unspecified: Secondary | ICD-10-CM

## 2011-08-18 DIAGNOSIS — I2510 Atherosclerotic heart disease of native coronary artery without angina pectoris: Secondary | ICD-10-CM

## 2011-08-18 DIAGNOSIS — I10 Essential (primary) hypertension: Secondary | ICD-10-CM

## 2011-08-18 DIAGNOSIS — I252 Old myocardial infarction: Secondary | ICD-10-CM

## 2011-08-18 NOTE — Patient Instructions (Signed)
Continue your current medications  I will see you again in 6 months.   

## 2011-08-22 NOTE — Assessment & Plan Note (Signed)
Blood pressure is borderline high today. I asked that he check his blood pressure at home and on his next doctor's visit bring his blood pressure cuff to have it calibrated. If blood pressure is consistently over 140 we will need to add additional therapy.

## 2011-08-22 NOTE — Assessment & Plan Note (Signed)
He remains on statin, niacin, and fish oil therapy. His lab work is followed by his primary care.

## 2011-08-22 NOTE — Progress Notes (Signed)
[name] Date of Birth: June 07, 1940 Medical Record #[id number]  History of Present Illness: [name] is seen today for followup. He has a history of coronary disease and is status post redo coronary bypass surgery in 2005. He has had an old anterior myocardial infarction. He reports that he has been doing very well from a cardiac standpoint. He has had no significant shortness of breath, chest pain, or palpitations. Since his back surgery in October he has noted that his blood pressure has been more labile. He typically gets readings of 130 to 150 systolic at home but has rarely had elevation as high as 170. He is not sure that his cuff is reading accurately.  Current Outpatient Prescriptions on File Prior to Visit  Medication Sig Dispense Refill  . ALPRAZolam (XANAX) 0.5 MG tablet Take 0.5 mg by mouth 3 (three) times daily as needed.        Marland Kitchen aspirin 81 MG tablet Take 81 mg by mouth daily.        Marland Kitchen CALCIUM PO Take by mouth daily.      . carvedilol (COREG) 12.5 MG tablet TAKE 1 TABLET TWICE A DAY  180 tablet  2  . clopidogrel (PLAVIX) 75 MG tablet Take 1 tablet (75 mg total) by mouth daily.  90 tablet  1  . fish oil-omega-3 fatty acids 1000 MG capsule Take 2 g by mouth daily.        Marland Kitchen glimepiride (AMARYL) 4 MG tablet Take 4 mg by mouth as directed.      Marland Kitchen losartan-hydrochlorothiazide (HYZAAR) 100-12.5 MG per tablet Take 1 tablet by mouth daily.  90 tablet  1  . niacin 500 MG tablet Take 500 mg by mouth daily with breakfast.        . NITROSTAT 0.4 MG SL tablet TAKE AS DIRECTED  25 tablet  3  . simvastatin (ZOCOR) 80 MG tablet Take 80 mg by mouth at bedtime.          No Known Allergies  Past Medical History  Diagnosis Date  . CAD (coronary artery disease)   . History of acute inferior wall MI   . Diabetes mellitus   . HTN (hypertension)   . Dyslipidemia   . 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 Coronary artery bypass graft redo  2005    free Rima to OM, svg-diag,svg-pda  . Tonsillectomy   . Rotator cuff repair   . Melanoma excision     History  Smoking status  . Former Smoker  . Quit date: 02/22/1986  Smokeless tobacco  . Not on file    History  Alcohol Use: Not on file    History reviewed. No pertinent family history.  Review of Systems: As noted in history of present illness.  All other systems were reviewed and are negative.  Physical Exam: BP 140/80  Pulse 73  Ht 5\' 10"  (1.778 m)  Wt 178 lb (80.74 kg)  BMI 25.54 kg/m2 He is a pleasant white male in no acute distress.The patient is alert and oriented x 3.  The mood and affect are normal.  The skin is warm and dry.  Color is normal.  The HEENT exam is unremarkable. Sclera are clear. PERRLA. The mucous membranes are moist.  The carotids are 2+ without bruits.  There is no thyromegaly.  There is no JVD.  The lungs are clear.  The chest wall is non tender.  The  heart exam reveals a regular rate with a normal S1 and S2.  There is a grade 1-2/6 systolic ejection murmur the left sternal border.  The PMI is not displaced.   Abdominal exam is nontender. Bowel sounds are positive. There is no hepatosplenomegaly or tenderness.  There are no masses.  Exam of the legs reveal no clubbing, cyanosis, or edema.    The distal pulses are intact.  Cranial nerves II - XII are intact.  Motor and sensory functions are intact.  The gait is normal.  LABORATORY DATA:   Assessment / Plan:

## 2011-08-22 NOTE — Assessment & Plan Note (Signed)
He continues to do well and is asymptomatic. We will continue with aspirin, carvedilol, Plavix, and statin therapy. His last stress test was in August of 2010 and we may need to consider updating this within the next year. I've encouraged him to increase his aerobic activity. I'll followup again in 6 months.

## 2011-08-23 ENCOUNTER — Telehealth: Payer: Self-pay | Admitting: Cardiology

## 2011-08-23 NOTE — Telephone Encounter (Signed)
Patient called no answer.Left message on personal voice mail spoke with Dr.Jordan ok to have mri.

## 2011-08-23 NOTE — Telephone Encounter (Signed)
New Problem:    Patient called in wondering if it would be possible to have a MRI if you have a stent placed.  Please call back and feel free to leave message.

## 2011-08-23 NOTE — Telephone Encounter (Signed)
Patient called, stated he wanted to check with Dr.Jordan to make sure ok to have mri of his back since he had stents.Patient was told will check with Dr.Jordan and call him back.

## 2011-09-07 ENCOUNTER — Other Ambulatory Visit: Payer: Self-pay | Admitting: Dermatology

## 2011-09-15 ENCOUNTER — Telehealth: Payer: Self-pay | Admitting: Cardiology

## 2011-09-15 NOTE — Telephone Encounter (Signed)
Spoke to [place] at [facility] she stated needed stent information from 2005.States patient had stent placement in [place], [facility].Jonie was told need to obtain patient's [facility] cardiology chart.Paper chart was obtained and could not find information about 2005 stent.Spoke to [place] at [facility] and she stated she has contacted hospital in [place] and she will obtain information.

## 2011-09-15 NOTE — Telephone Encounter (Signed)
New problem:  Patient is schedule for mri on 6/21 @ 10:30 . Need op report fax over that show the make of stent.

## 2011-11-30 ENCOUNTER — Other Ambulatory Visit: Payer: Self-pay | Admitting: *Deleted

## 2011-11-30 MED ORDER — NITROGLYCERIN 0.4 MG SL SUBL: 0.4000 mg | SUBLINGUAL_TABLET | SUBLINGUAL | Status: DC | PRN

## 2012-01-03 ENCOUNTER — Other Ambulatory Visit (HOSPITAL_COMMUNITY): Payer: Self-pay | Admitting: Orthopedic Surgery

## 2012-01-03 DIAGNOSIS — M545 Low back pain, unspecified: Secondary | ICD-10-CM

## 2012-01-16 ENCOUNTER — Encounter (HOSPITAL_COMMUNITY)
Admission: RE | Admit: 2012-01-16 | Discharge: 2012-01-16 | Disposition: A | Discharge status: Home / Self Care | Payer: Medicare Other | Source: Ambulatory Visit | Attending: Orthopedic Surgery | Admitting: Orthopedic Surgery

## 2012-01-16 DIAGNOSIS — M545 Low back pain, unspecified: Secondary | ICD-10-CM | POA: Insufficient documentation

## 2012-01-16 DIAGNOSIS — C439 Malignant melanoma of skin, unspecified: Secondary | ICD-10-CM | POA: Insufficient documentation

## 2012-01-16 DIAGNOSIS — R948 Abnormal results of function studies of other organs and systems: Secondary | ICD-10-CM | POA: Insufficient documentation

## 2012-01-16 MED ORDER — TECHNETIUM TC 99M MEDRONATE IV KIT
25.0000 | PACK | Freq: Once | INTRAVENOUS | Status: AC | PRN
  Administered 2012-01-16: 25 via INTRAVENOUS

## 2012-01-30 ENCOUNTER — Telehealth: Payer: Self-pay | Admitting: Cardiology

## 2012-01-30 ENCOUNTER — Other Ambulatory Visit: Payer: Self-pay

## 2012-01-30 MED ORDER — LOSARTAN POTASSIUM-HCTZ 100-12.5 MG PO TABS: 1.0000 | ORAL_TABLET | Freq: Every day | ORAL | Status: DC

## 2012-01-30 MED ORDER — CLOPIDOGREL BISULFATE 75 MG PO TABS: 75.0000 mg | ORAL_TABLET | Freq: Every day | ORAL | Status: DC

## 2012-01-30 NOTE — Telephone Encounter (Signed)
plz return call to  [facility]- Dr. Patsi Sears [phone] regarding cardiac clearance

## 2012-01-30 NOTE — Telephone Encounter (Signed)
He is cleared for kyphoplasty from cardiac standpoint.  Peter Swaziland MD, [facility]

## 2012-01-30 NOTE — Telephone Encounter (Signed)
Pt. needs to have Kyphoplasty performed with Dr. Patsi Sears and needs cardiac clearance. Pt. Is due for 6 month f/u with Dr. Swaziland at this time. Please contact Carla at Dr. Princella Pellegrini office at [phone] to inform her if pt. will need f/u with Dr. Swaziland before procedure or if they can proceed. Clearance letter may be faxed to [phone].

## 2012-02-01 ENCOUNTER — Telehealth: Payer: Self-pay

## 2012-02-01 NOTE — Telephone Encounter (Signed)
Surgical clearance letter faxed to [facility] at Dr.Dumonski's office.

## 2012-02-02 ENCOUNTER — Telehealth: Payer: Self-pay | Admitting: Cardiology

## 2012-02-02 NOTE — Telephone Encounter (Signed)
Can hold Plavix for procedure. Would continue baby ASA.  Peter Swaziland MD, [facility]

## 2012-02-02 NOTE — Telephone Encounter (Signed)
Spoke to patient he stated he is scheduled for endo and colonoscopy this Monday 02/06/12 needs to know if ok to hold Plavix.Message fowarded to Dr.Jordan for advice.

## 2012-02-02 NOTE — Telephone Encounter (Signed)
Spoke to patient was told okay with Dr.Jordan to hold plavix before procedure,but continue baby aspirin.Also Britta Mccreedy at Dr.Magod's called and told.

## 2012-02-02 NOTE — Telephone Encounter (Signed)
Spoke to [place] at Dr.Dumonski's office she did receive surgical clearance letter.Letter re faxed.

## 2012-02-02 NOTE — Telephone Encounter (Signed)
Dr Ewing Schlein att [facility], [phone] fax  pt needs surgical clearence for colonscopy and endoscopy  02-06-12  At 700am , pt would like a call when done, needs tod ay if poss may need meds pt  [phone]

## 2012-02-02 NOTE — Telephone Encounter (Signed)
plz return call to [facility]- Dr. Yevette Edwards [phone]   Regarding surgical clearance for pt sent 10/31.  Dr. Algis Downs is waiting to schedule surgery

## 2012-02-10 ENCOUNTER — Other Ambulatory Visit: Payer: Self-pay | Admitting: Orthopedic Surgery

## 2012-02-15 ENCOUNTER — Encounter (HOSPITAL_COMMUNITY): Payer: Self-pay | Admitting: Pharmacy Technician

## 2012-02-17 ENCOUNTER — Ambulatory Visit (HOSPITAL_COMMUNITY)
Admission: RE | Admit: 2012-02-17 | Discharge: 2012-02-17 | Disposition: A | Discharge status: Home / Self Care | Payer: Medicare Other | Source: Ambulatory Visit | Attending: Orthopedic Surgery | Admitting: Orthopedic Surgery

## 2012-02-17 ENCOUNTER — Encounter (HOSPITAL_COMMUNITY): Payer: Self-pay

## 2012-02-17 ENCOUNTER — Encounter (HOSPITAL_COMMUNITY)
Admission: RE | Admit: 2012-02-17 | Discharge: 2012-02-17 | Disposition: A | Discharge status: Home / Self Care | Payer: Medicare Other | Source: Ambulatory Visit | Attending: Orthopedic Surgery | Admitting: Orthopedic Surgery

## 2012-02-17 DIAGNOSIS — S22009A Unspecified fracture of unspecified thoracic vertebra, initial encounter for closed fracture: Secondary | ICD-10-CM | POA: Insufficient documentation

## 2012-02-17 DIAGNOSIS — X58XXXA Exposure to other specified factors, initial encounter: Secondary | ICD-10-CM | POA: Insufficient documentation

## 2012-02-17 DIAGNOSIS — Z951 Presence of aortocoronary bypass graft: Secondary | ICD-10-CM | POA: Insufficient documentation

## 2012-02-17 DIAGNOSIS — Z01818 Encounter for other preprocedural examination: Secondary | ICD-10-CM | POA: Insufficient documentation

## 2012-02-17 HISTORY — DX: Benign prostatic hyperplasia without lower urinary tract symptoms: N40.0

## 2012-02-17 LAB — URINALYSIS, ROUTINE W REFLEX MICROSCOPIC
Bilirubin Urine: NEGATIVE
Hgb urine dipstick: NEGATIVE
Ketones, ur: NEGATIVE mg/dL
Nitrite: NEGATIVE
Protein, ur: NEGATIVE mg/dL
Specific Gravity, Urine: 1.015 (ref 1.005–1.030)
Urobilinogen, UA: 1 mg/dL (ref 0.0–1.0)

## 2012-02-17 LAB — CBC WITH DIFFERENTIAL/PLATELET
Basophils Relative: 0 % (ref 0–1)
Eosinophils Absolute: 0.1 10*3/uL (ref 0.0–0.7)
Eosinophils Relative: 2 % (ref 0–5)
Hemoglobin: 11.5 g/dL — ABNORMAL LOW (ref 13.0–17.0)
Lymphs Abs: 1.4 10*3/uL (ref 0.7–4.0)
MCH: 31.3 pg (ref 26.0–34.0)
MCHC: 36.4 g/dL — ABNORMAL HIGH (ref 30.0–36.0)
MCV: 86.1 fL (ref 78.0–100.0)
Monocytes Relative: 11 % (ref 3–12)
Neutrophils Relative %: 61 % (ref 43–77)
RBC: 3.67 MIL/uL — ABNORMAL LOW (ref 4.22–5.81)

## 2012-02-17 LAB — PROTIME-INR
INR: 1.05 (ref 0.00–1.49)
Prothrombin Time: 13.6 seconds (ref 11.6–15.2)

## 2012-02-17 LAB — TYPE AND SCREEN
ABO/RH(D): A POS
Antibody Screen: NEGATIVE

## 2012-02-17 LAB — COMPREHENSIVE METABOLIC PANEL
Albumin: 4.3 g/dL (ref 3.5–5.2)
BUN: 15 mg/dL (ref 6–23)
Calcium: 10 mg/dL (ref 8.4–10.5)
Creatinine, Ser: 0.92 mg/dL (ref 0.50–1.35)
GFR calc Af Amer: >90 mL/min (ref >90)
Glucose, Bld: 219 mg/dL — ABNORMAL HIGH (ref 70–99)
Total Protein: 7.6 g/dL (ref 6.0–8.3)

## 2012-02-17 LAB — SURGICAL PCR SCREEN: Staphylococcus aureus: NEGATIVE

## 2012-02-17 NOTE — Progress Notes (Signed)
SEES DR PETER Swaziland FOR HEART...LOV 6 MTHS AGO....HAS HAD NO CARDIO PROBLEMS SINCE HIS 2005 CABG...STATES HE HAD ECHO ALSO...WILL REQUEST....DA

## 2012-02-17 NOTE — Pre-Procedure Instructions (Signed)
20 PRIYANSH PRY  02/17/2012   Your procedure is scheduled on: Wednesday, December 4th   Report to [facility] at 11:15 AM.  Call this number if you have problems the morning of surgery: [phone]   Remember:   Do not eat food or drink any liquids:After Midnight Tuesday.    Take these medicines the morning of surgery with A SIP OF WATER: Xanax, Carvedilol   Do not wear jewelry.  Do not wear lotions, powders, or colognes. You may NOT wear deodorant.   Men may shave face and neck.   Do not bring valuables to the hospital.  Contacts, dentures or bridgework may not be worn into surgery.   Leave suitcase in the car. After surgery it may be brought to your room.  For patients admitted to the hospital, checkout time is 11:00 AM the day of discharge.   Patients discharged the day of surgery will not be allowed to drive home.   Name and phone number of your driver:    Special Instructions: Shower using CHG 2 nights before surgery and the night before surgery.  If you shower the day of surgery use CHG.  Use special wash - you have one bottle of CHG for all showers.  You should use approximately 1/3 of the bottle for each shower.   Please read over the following fact sheets that you were given: Pain Booklet, Coughing and Deep Breathing, Blood Transfusion Information, MRSA Information and Surgical Site Infection Prevention

## 2012-02-28 MED ORDER — POVIDONE-IODINE 7.5 % EX SOLN
Freq: Once | CUTANEOUS | Status: DC
  Filled 2012-02-28: qty 118

## 2012-02-28 MED ORDER — CEFAZOLIN SODIUM-DEXTROSE 2-3 GM-% IV SOLR
2.0000 g | INTRAVENOUS | Status: AC
  Administered 2012-02-29: 2 g via INTRAVENOUS
  Filled 2012-02-28: qty 50

## 2012-02-28 NOTE — Progress Notes (Signed)
Pt notified of time change and to arrive at 12:00 -msg was left on answering machine

## 2012-02-29 ENCOUNTER — Ambulatory Visit (HOSPITAL_COMMUNITY): Payer: Medicare Other

## 2012-02-29 ENCOUNTER — Encounter (HOSPITAL_COMMUNITY)
Admission: RE | Disposition: A | Discharge status: Home / Self Care | Payer: Self-pay | Source: Ambulatory Visit | Attending: Orthopedic Surgery

## 2012-02-29 ENCOUNTER — Encounter (HOSPITAL_COMMUNITY): Payer: Self-pay | Admitting: Anesthesiology

## 2012-02-29 ENCOUNTER — Ambulatory Visit (HOSPITAL_COMMUNITY)
Admission: RE | Admit: 2012-02-29 | Discharge: 2012-02-29 | Disposition: A | Discharge status: Home / Self Care | Payer: Medicare Other | Source: Ambulatory Visit | Attending: Orthopedic Surgery | Admitting: Orthopedic Surgery

## 2012-02-29 ENCOUNTER — Ambulatory Visit (HOSPITAL_COMMUNITY): Payer: Medicare Other | Admitting: Anesthesiology

## 2012-02-29 DIAGNOSIS — M19049 Primary osteoarthritis, unspecified hand: Secondary | ICD-10-CM | POA: Insufficient documentation

## 2012-02-29 DIAGNOSIS — Z87891 Personal history of nicotine dependence: Secondary | ICD-10-CM | POA: Insufficient documentation

## 2012-02-29 DIAGNOSIS — Z951 Presence of aortocoronary bypass graft: Secondary | ICD-10-CM | POA: Insufficient documentation

## 2012-02-29 DIAGNOSIS — I2510 Atherosclerotic heart disease of native coronary artery without angina pectoris: Secondary | ICD-10-CM | POA: Insufficient documentation

## 2012-02-29 DIAGNOSIS — N400 Enlarged prostate without lower urinary tract symptoms: Secondary | ICD-10-CM | POA: Insufficient documentation

## 2012-02-29 DIAGNOSIS — E785 Hyperlipidemia, unspecified: Secondary | ICD-10-CM | POA: Insufficient documentation

## 2012-02-29 DIAGNOSIS — M8448XA Pathological fracture, other site, initial encounter for fracture: Secondary | ICD-10-CM | POA: Insufficient documentation

## 2012-02-29 DIAGNOSIS — I252 Old myocardial infarction: Secondary | ICD-10-CM | POA: Insufficient documentation

## 2012-02-29 DIAGNOSIS — Z85820 Personal history of malignant melanoma of skin: Secondary | ICD-10-CM | POA: Insufficient documentation

## 2012-02-29 DIAGNOSIS — Z7982 Long term (current) use of aspirin: Secondary | ICD-10-CM | POA: Insufficient documentation

## 2012-02-29 DIAGNOSIS — Z9861 Coronary angioplasty status: Secondary | ICD-10-CM | POA: Insufficient documentation

## 2012-02-29 DIAGNOSIS — E119 Type 2 diabetes mellitus without complications: Secondary | ICD-10-CM | POA: Insufficient documentation

## 2012-02-29 HISTORY — PX: KYPHOPLASTY: SHX5884

## 2012-02-29 LAB — GLUCOSE, CAPILLARY
Glucose-Capillary: 247 mg/dL — ABNORMAL HIGH (ref 70–99)
Glucose-Capillary: 188 mg/dL — ABNORMAL HIGH (ref 70–99)

## 2012-02-29 SURGERY — KYPHOPLASTY
Anesthesia: General | Site: Spine Lumbar | Laterality: Bilateral | Wound class: Clean

## 2012-02-29 MED ORDER — FENTANYL CITRATE 0.05 MG/ML IJ SOLN
INTRAMUSCULAR | Status: DC | PRN
  Administered 2012-02-29: 100 ug via INTRAVENOUS

## 2012-02-29 MED ORDER — OXYCODONE HCL 5 MG/5ML PO SOLN: 5.0000 mg | Freq: Once | ORAL | Status: DC | PRN

## 2012-02-29 MED ORDER — ROCURONIUM BROMIDE 100 MG/10ML IV SOLN
INTRAVENOUS | Status: DC | PRN
  Administered 2012-02-29: 40 mg via INTRAVENOUS

## 2012-02-29 MED ORDER — ONDANSETRON HCL 4 MG/2ML IJ SOLN
INTRAMUSCULAR | Status: DC | PRN
  Administered 2012-02-29: 4 mg via INTRAVENOUS

## 2012-02-29 MED ORDER — EPHEDRINE SULFATE 50 MG/ML IJ SOLN
INTRAMUSCULAR | Status: DC | PRN
  Administered 2012-02-29 (×3): 7.5 mg via INTRAVENOUS

## 2012-02-29 MED ORDER — PROPOFOL 10 MG/ML IV BOLUS
INTRAVENOUS | Status: DC | PRN
  Administered 2012-02-29: 150 mg via INTRAVENOUS

## 2012-02-29 MED ORDER — BUPIVACAINE-EPINEPHRINE PF 0.25-1:200000 % IJ SOLN
INTRAMUSCULAR | Status: AC
  Filled 2012-02-29: qty 30

## 2012-02-29 MED ORDER — HYDROMORPHONE HCL PF 1 MG/ML IJ SOLN: 0.2500 mg | INTRAMUSCULAR | Status: DC | PRN

## 2012-02-29 MED ORDER — OXYCODONE HCL 5 MG PO TABS: 5.0000 mg | ORAL_TABLET | Freq: Once | ORAL | Status: DC | PRN

## 2012-02-29 MED ORDER — PROMETHAZINE HCL 25 MG/ML IJ SOLN: 6.2500 mg | INTRAMUSCULAR | Status: DC | PRN

## 2012-02-29 MED ORDER — ARTIFICIAL TEARS OP OINT
TOPICAL_OINTMENT | OPHTHALMIC | Status: DC | PRN
  Administered 2012-02-29: 1 via OPHTHALMIC

## 2012-02-29 MED ORDER — LIDOCAINE HCL (CARDIAC) 20 MG/ML IV SOLN
INTRAVENOUS | Status: DC | PRN
  Administered 2012-02-29: 70 mg via INTRAVENOUS

## 2012-02-29 MED ORDER — NEOSTIGMINE METHYLSULFATE 1 MG/ML IJ SOLN
INTRAMUSCULAR | Status: DC | PRN
  Administered 2012-02-29: 3 mg via INTRAVENOUS

## 2012-02-29 MED ORDER — BUPIVACAINE-EPINEPHRINE 0.25% -1:200000 IJ SOLN: INTRAMUSCULAR | Status: DC | PRN

## 2012-02-29 MED ORDER — GLYCOPYRROLATE 0.2 MG/ML IJ SOLN
INTRAMUSCULAR | Status: DC | PRN
  Administered 2012-02-29: 0.4 mg via INTRAVENOUS

## 2012-02-29 MED ORDER — IOHEXOL 300 MG/ML  SOLN
INTRAMUSCULAR | Status: DC | PRN
  Administered 2012-02-29: 50 mL

## 2012-02-29 MED ORDER — SODIUM CHLORIDE 0.9 % IR SOLN
Status: DC | PRN
  Administered 2012-02-29: 1000 mL

## 2012-02-29 MED ORDER — LACTATED RINGERS IV SOLN
INTRAVENOUS | Status: DC | PRN
  Administered 2012-02-29 (×2): via INTRAVENOUS

## 2012-02-29 SURGICAL SUPPLY — 42 items
BANDAGE ADHESIVE 1X3 (GAUZE/BANDAGES/DRESSINGS) ×3 IMPLANT
BLADE SURG 15 STRL LF DISP TIS (BLADE) ×1 IMPLANT
BLADE SURG 15 STRL SS (BLADE) ×2
CEMENT BONE KYPHX HV R (Orthopedic Implant) ×1 IMPLANT
CEMENT KYPHON C01A KIT/MIXER (Cement) ×2 IMPLANT
CLOTH BEACON ORANGE TIMEOUT ST (SAFETY) ×2 IMPLANT
CONT SPEC 4OZ CLIKSEAL STRL BL (MISCELLANEOUS) ×1 IMPLANT
COVER MAYO STAND STRL (DRAPES) ×2 IMPLANT
CURETTE WEDGE 8.5MM KYPHX (MISCELLANEOUS) IMPLANT
DRAPE C-ARM 42X72 X-RAY (DRAPES) ×4 IMPLANT
DRAPE INCISE IOBAN 66X45 STRL (DRAPES) ×2 IMPLANT
DRAPE LAPAROTOMY T 102X78X121 (DRAPES) ×2 IMPLANT
DRAPE PROXIMA HALF (DRAPES) ×4 IMPLANT
DRAPE SURG 17X23 STRL (DRAPES) ×8 IMPLANT
DURAPREP 26ML APPLICATOR (WOUND CARE) ×2 IMPLANT
GAUZE SPONGE 4X4 16PLY XRAY LF (GAUZE/BANDAGES/DRESSINGS) ×2 IMPLANT
GLOVE BIO SURGEON STRL SZ7.5 (GLOVE) ×1 IMPLANT
GLOVE BIO SURGEON STRL SZ8 (GLOVE) ×2 IMPLANT
GLOVE BIOGEL PI IND STRL 7.5 (GLOVE) IMPLANT
GLOVE BIOGEL PI IND STRL 8 (GLOVE) ×1 IMPLANT
GLOVE BIOGEL PI INDICATOR 7.5 (GLOVE) ×1
GLOVE BIOGEL PI INDICATOR 8 (GLOVE) ×1
GOWN PREVENTION PLUS XLARGE (GOWN DISPOSABLE) ×2 IMPLANT
GOWN STRL NON-REIN LRG LVL3 (GOWN DISPOSABLE) ×4 IMPLANT
KIT BASIN OR (CUSTOM PROCEDURE TRAY) ×2 IMPLANT
KIT POSITION SURG JACKSON T1 (MISCELLANEOUS) ×2 IMPLANT
KIT ROOM TURNOVER OR (KITS) ×2 IMPLANT
NDL HYPO 25X1 1.5 SAFETY (NEEDLE) ×1 IMPLANT
NDL SPNL 18GX3.5 QUINCKE PK (NEEDLE) ×2 IMPLANT
NEEDLE 27GAX1X1/2 (NEEDLE) ×2 IMPLANT
NEEDLE HYPO 25X1 1.5 SAFETY (NEEDLE) ×2 IMPLANT
NEEDLE SPNL 18GX3.5 QUINCKE PK (NEEDLE) ×4 IMPLANT
NS IRRIG 1000ML POUR BTL (IV SOLUTION) ×2 IMPLANT
PACK SURGICAL SETUP 50X90 (CUSTOM PROCEDURE TRAY) ×2 IMPLANT
PAD ARMBOARD 7.5X6 YLW CONV (MISCELLANEOUS) ×4 IMPLANT
SUT MNCRL AB 4-0 PS2 18 (SUTURE) ×2 IMPLANT
SYR CONTROL 10ML LL (SYRINGE) ×2 IMPLANT
TOWEL OR 17X24 6PK STRL BLUE (TOWEL DISPOSABLE) ×2 IMPLANT
TOWEL OR 17X26 10 PK STRL BLUE (TOWEL DISPOSABLE) ×2 IMPLANT
TRAY KYPHOPAK 15/2 EXPRESS (KITS) ×1 IMPLANT
TRAY KYPHOPAK 15/3 ONESTEP 1ST (MISCELLANEOUS) IMPLANT
WATER STERILE IRR 1000ML POUR (IV SOLUTION) ×2 IMPLANT

## 2012-02-29 NOTE — H&P (Signed)
PREOPERATIVE H&P  Chief Complaint: mid back pain  HPI: [name] is a 71 y.o. male who presents with back pain  Past Medical History  Diagnosis Date  . CAD (coronary artery disease)   . History of acute inferior wall MI   . Diabetes mellitus   . HTN (hypertension)   . Dyslipidemia   . 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 BPH (benign prostatic hyperplasia)   . 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 Coronary artery bypass graft redo 2005    free Rima to OM, svg-diag,svg-pda  . Tonsillectomy   . Rotator cuff repair   . Melanoma excision   . Eye surgery     BILATERAL    History   Social History  . Marital Status: Married    Spouse Name: N/A    Number of Children: 2  . Years of Education: N/A   Occupational History  . insurance    Social History Main Topics  . Smoking status: Former Smoker    Quit date: 02/22/1986  . Smokeless tobacco: Not on file  . Alcohol Use: No  . Drug Use: No  . Sexually Active: Not on file   Other Topics Concern  . Not on file   Social History Narrative  . No narrative on file   No family history on file. No Known Allergies Prior to Admission medications   Medication Sig Start Date End Date Taking? Authorizing Provider  ALPRAZolam Prudy Feeler) 0.5 MG tablet Take 0.5 mg by mouth 3 (three) times daily as needed.     Yes Historical Provider, MD  aspirin 81 MG tablet Take 81 mg by mouth daily.     Yes Historical Provider, MD  carvedilol (COREG) 12.5 MG tablet TAKE 1 TABLET TWICE A DAY 02/22/11  Yes Peter M Swaziland, MD  clopidogrel (PLAVIX) 75 MG tablet Take 1 tablet (75 mg total) by mouth daily. 01/30/12  Yes Peter M Swaz[place], MD  glimepiride (AMARYL) 4 MG tablet Take 4 mg by mouth daily before breakfast.    Yes Historical Provider, MD  losartan-hydrochlorothiazide (HYZAAR) 100-12.5 MG per tablet Take 1 tablet by mouth daily. 01/30/12  Yes Peter M Swaziland, MD  nitroGLYCERIN (NITROSTAT) 0.4 MG SL  tablet Place 1 tablet (0.4 mg total) under the tongue every 5 (five) minutes as needed for chest pain. 11/30/11  Yes Peter M Swazil[place], MD  simvastatin (ZOCOR) 80 MG tablet Take 80 mg by mouth at bedtime.     Yes Historical Provider, MD  metaxalone (SKELAXIN) 800 MG tablet Take 800 mg by mouth 3 (three) times daily.    Historical Provider, MD     All other systems have been reviewed and were otherwise negative with the exception of those mentioned in the HPI and as above.  Physical Exam: There were no vitals filed for this visit.  General: Alert, no acute distress Cardiovascular: No pedal edema Respiratory: No cyanosis, no use of accessory musculature GI: No organomegaly, abdomen is soft and non-tender Skin: No lesions in the area of chief complaint Neurologic: Sensation intact distally Psychiatric: Patient is competent for consent with normal mood and affect Lymphatic: No axillary or cervical lymphadenopathy  MUSCULOSKELETAL: + TTP mid-back  Assessment/Plan: Compression fracture Plan for Procedure(s): KYPHOPLASTY   Emilee Hero, MD 02/29/2012 7:44 AM

## 2012-02-29 NOTE — Preoperative (Signed)
Beta Blockers   Reason not to administer Beta Blockers:Not Applicable 

## 2012-02-29 NOTE — Anesthesia Preprocedure Evaluation (Addendum)
Anesthesia Evaluation  Patient identified by MRN, date of birth, ID band Patient awake    Reviewed: Allergy & Precautions, H&P , NPO status , Patient's Chart, lab work & pertinent test results, reviewed documented beta blocker date and time   Airway Mallampati: II TM Distance: >3 FB Neck ROM: Full    Dental  (+) Teeth Intact   Pulmonary neg pulmonary ROS,    Pulmonary exam normal       Cardiovascular hypertension, Pt. on medications and Pt. on home beta blockers + CAD, + Past MI, + Cardiac Stents and + CABG     Neuro/Psych    GI/Hepatic negative GI ROS, Neg liver ROS,   Endo/Other  diabetes, Poorly Controlled, Type 2, Oral Hypoglycemic Agents  Renal/GU negative Renal ROS     Musculoskeletal   Abdominal Normal abdominal exam  (+)   Peds  Hematology negative hematology ROS (+)   Anesthesia Other Findings   Reproductive/Obstetrics                          Anesthesia Physical Anesthesia Plan  ASA: III  Anesthesia Plan: General   Post-op Pain Management:    Induction: Intravenous  Airway Management Planned: Oral ETT  Additional Equipment:   Intra-op Plan:   Post-operative Plan: Extubation in OR  Informed Consent: I have reviewed the patients History and Physical, chart, labs and discussed the procedure including the risks, benefits and alternatives for the proposed anesthesia with the patient or authorized representative who has indicated his/her understanding and acceptance.   Dental advisory given  Plan Discussed with: CRNA, Anesthesiologist and Surgeon  Anesthesia Plan Comments:         Anesthesia Quick Evaluation

## 2012-02-29 NOTE — Anesthesia Postprocedure Evaluation (Signed)
Anesthesia Post Note  Patient: [name]  Procedure(s) Performed: Procedure(s) (LRB): KYPHOPLASTY (Bilateral)  Anesthesia type: general  Patient location: PACU  Post pain: Pain level controlled  Post assessment: Patient's Cardiovascular Status Stable  Last Vitals:  Filed Vitals:   02/29/12 1554  BP: 140/64  Pulse: 52  Temp:   Resp: 12    Post vital signs: Reviewed and stable  Level of consciousness: sedated  Complications: No apparent anesthesia complications

## 2012-02-29 NOTE — Transfer of Care (Signed)
Immediate Anesthesia Transfer of Care Note  Patient: [name]  Procedure(s) Performed: Procedure(s) (LRB) with comments: KYPHOPLASTY (Bilateral) - T 10 kyphoplasty  Patient Location: PACU  Anesthesia Type:General  Level of Consciousness: awake and alert   Airway & Oxygen Therapy: Patient Spontanous Breathing and Patient connected to face mask oxygen  Post-op Assessment: Report given to PACU RN  Post vital signs: Reviewed and stable  Complications: No apparent anesthesia complications

## 2012-03-01 ENCOUNTER — Encounter (HOSPITAL_COMMUNITY): Payer: Self-pay | Admitting: Orthopedic Surgery

## 2012-03-01 NOTE — Op Note (Signed)
NAMEDECARLO, RIVET NO.:  [id number]  MEDICAL RECORD NO.:  [id number]  LOCATION:  [facility]                         FACILITY:  [facility]  PHYSICIAN:  Estill Bamberg, MD      DATE OF BIRTH:  [birth year]  DATE OF PROCEDURE:  02/29/2012 DATE OF DISCHARGE:  02/29/2012                              OPERATIVE REPORT   PREOPERATIVE DIAGNOSIS:  T10 compression fracture.  POSTOPERATIVE DIAGNOSIS:  T10 compression fracture.  PROCEDURE:  T10 kyphoplasty.  SURGEON:  Estill Bamberg, MD  ASSISTANT:  Jason Coop.  ANESTHESIA:  General endotracheal anesthesia.  COMPLICATIONS:  None.  DISPOSITION:  Stable.  ESTIMATED BLOOD LOSS:  Minimal.  INDICATIONS FOR PROCEDURE:  Briefly, Mr. Berrie is a very pleasant 71- year-old male who initially presented to me on September 30, 2011, with pain in the midaspect of his back.  The patient was previously noted to have a compression fracture, which does appear likely did occur on January 08, 2012.  The patient did continue to have ongoing pain.  We did continue to follow him conservatively, as I was not convinced this pain was in fact from his compression fracture.  He then came back with additional complaints related to pain in the region of the T12 level. Given my reservations about whether his pain was emanating from this compression fracture, I did go forward with a bone scan.  The patient did return back and bone scan did review activity at the T10 level.  Of note, there was also activity at the sternum and right ischium.  Of note, the patient does have a history of melanoma, which has removed some of the scalp and I did have some concern as to whether this may be secondary to metastatic disease.  Therefore, the plan was to go forward with a kyphoplasty and to perform an intraoperative biopsy as well.  The patient fully understood the risks and limitations of the procedure.  Of particular note, the patient did understand that his symptoms  were not classic for a compression fracture, however, his bone scan was positive. The patient did understand that given the chronicity of his pain, that being well over 59-year-old, specifically, the fact that his pain was present for over 1 year, I did a reservations as to how much the kyphoplasty would help him and I did discuss this thoroughly in an extensive detail with the patient.  He did understand the mainly, pain from a compression fracture has gently alleviated it well before 1 year after the injury.  However, given his positive bone scan, we did decide to proceed.  OPERATIVE DETAILS:  On February 29, 2012, the patient was brought to Surgery and general endotracheal anesthesia was administered.  The patient was placed prone on a well-padded flat Jackson bed.  Gel rolls were placed under the patient's chest and hips.  Antibiotics were given and a time-out procedure was performed.  I did use biplanar fluoroscopy, using both AP and lateral fluoroscopic views.  This did identify the T10 level.  I then placed Jamshidi needles across the T10 pedicle into the T10 vertebral body.  I then placed kyphoplasty balloons, through each of the trocars into the  vertebral body.  Prior to this, I did use a drill to obtain tissue from the patient's vertebral body, as I did have slight concerns that his pathology may be related to metastatic disease.  The tissue was sent off to Pathology.  I then placed kyphoplasty balloons into the each trocar on both the right and the left sides.  I did attempt to inflate the balloons, however, it was obvious that the bone was extremely sclerotic.  I was only able to infiltrate approximately 0.5 mL of contrast into each of the balloons.  There was no perceivable movement of the superior or inferior endplates.  At this point, I did introduce cement through the trocars into the vertebral body.  I did note excellent interdigitation of the cement.  There was  no extravasation of cement into the spinal column over the region of the pedicles.  I was pleased with the final construct.  In total, I did introduce approximately 1 mL of cement on both the right and the left sides.  Again, this did result in good interdigitation.  The trocars were then removed.  The stab incisions were then closed using 4-0 Monocryl, and Band-Aids were then placed.  The patient was then awakened from general endotracheal anesthesia and transferred to the recovery in stable condition.  Of note, Jason Coop was my assistant throughout the procedure.     Estill Bamberg, MD     MD/MEDQ  D:  02/29/2012  T:  03/01/2012  Job:  [id number]  cc:   Peter M. Swaziland, M.D. Dr. Tenny Craw

## 2012-03-09 ENCOUNTER — Ambulatory Visit (INDEPENDENT_AMBULATORY_CARE_PROVIDER_SITE_OTHER): Payer: Medicare Other | Admitting: Urology

## 2012-03-09 DIAGNOSIS — N401 Enlarged prostate with lower urinary tract symptoms: Secondary | ICD-10-CM

## 2012-03-09 DIAGNOSIS — N529 Male erectile dysfunction, unspecified: Secondary | ICD-10-CM

## 2012-03-09 DIAGNOSIS — N138 Other obstructive and reflux uropathy: Secondary | ICD-10-CM

## 2012-04-19 ENCOUNTER — Ambulatory Visit (INDEPENDENT_AMBULATORY_CARE_PROVIDER_SITE_OTHER): Payer: Medicare Other | Admitting: Cardiology

## 2012-04-19 ENCOUNTER — Encounter: Payer: Self-pay | Admitting: Cardiology

## 2012-04-19 VITALS — BP 120/86 | HR 61 | Ht 70.0 in | Wt 173.2 lb

## 2012-04-19 DIAGNOSIS — E785 Hyperlipidemia, unspecified: Secondary | ICD-10-CM

## 2012-04-19 DIAGNOSIS — I2510 Atherosclerotic heart disease of native coronary artery without angina pectoris: Secondary | ICD-10-CM

## 2012-04-19 DIAGNOSIS — I10 Essential (primary) hypertension: Secondary | ICD-10-CM

## 2012-04-19 NOTE — Progress Notes (Signed)
[name] Date of Birth: 1940/11/10 Medical Record #[id number]  History of Present Illness: [name] is seen today for followup. He has a history of coronary disease and is status post redo coronary bypass surgery in 2005. He has had an old anterior myocardial infarction. He reports that he has been doing very well from a cardiac standpoint. He has had no significant shortness of breath, chest pain, or palpitations. He had kyphoplasty on December 4. He is in a rehabilitation program now and making good progress. Current Outpatient Prescriptions on File Prior to Visit  Medication Sig Dispense Refill  . ALPRAZolam (XANAX) 0.5 MG tablet Take 0.5 mg by mouth 3 (three) times daily as needed.        Marland Kitchen aspirin 81 MG tablet Take 81 mg by mouth daily.        . carvedilol (COREG) 12.5 MG tablet TAKE 1 TABLET TWICE A DAY  180 tablet  2  . clopidogrel (PLAVIX) 75 MG tablet Take 1 tablet (75 mg total) by mouth daily.  90 tablet  1  . glimepiride (AMARYL) 4 MG tablet Take 4 mg by mouth daily before breakfast.       . losartan-hydrochlorothiazide (HYZAAR) 100-12.5 MG per tablet Take 1 tablet by mouth daily.  90 tablet  1  . nitroGLYCERIN (NITROSTAT) 0.4 MG SL tablet Place 1 tablet (0.4 mg total) under the tongue every 5 (five) minutes as needed for chest pain.  25 tablet  3  . pioglitazone (ACTOS) 45 MG tablet Take 45 mg by mouth daily.       . simvastatin (ZOCOR) 80 MG tablet Take 80 mg by mouth at bedtime.          No Known Allergies  Past Medical History  Diagnosis Date  . CAD (coronary artery disease)   . History of acute inferior wall MI   . Diabetes mellitus   . HTN (hypertension)   . Dyslipidemia   . 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 BPH (benign prostatic hyperplasia)   . 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 Coronary artery bypass graft redo 2005    free Rima to OM, svg-diag,svg-pda  . Tonsillectomy   . Rotator cuff repair   .  Melanoma excision   . Eye surgery     BILATERAL   . Kyphoplasty 02/29/2012    Procedure: KYPHOPLASTY;  Surgeon: Emilee Hero, MD;  Location: [facility] OR;  Service: Orthopedics;  Laterality: Bilateral;  T 10 kyphoplasty    History  Smoking status  . Former Smoker  . Quit date: 02/22/1986  Smokeless tobacco  . Not on file    History  Alcohol Use No    History reviewed. No pertinent family history.  Review of Systems: As noted in history of present illness.  All other systems were reviewed and are negative.  Physical Exam: BP 120/86  Pulse 61  Ht 5\' 10"  (1.778 m)  Wt 173 lb 3.2 oz (78.563 kg)  BMI 24.85 kg/m2  SpO2 98% He is a pleasant white male in no acute distress.The patient is alert and oriented x 3.  The mood and affect are normal.  The skin is warm and dry.  Color is normal.  The HEENT exam is unremarkable. Sclera are clear. PERRLA. The mucous membranes are moist.  The carotids are 2+ without bruits.  There is no thyromegaly.  There is no JVD.  The lungs are  clear.  The chest wall is non tender.  The heart exam reveals a regular rate with a normal S1 and S2.  There is a grade 1-2/6 systolic ejection murmur the left sternal border.  The PMI is not displaced.   Abdominal exam is nontender. Bowel sounds are positive. There is no hepatosplenomegaly or tenderness.  There are no masses.  Exam of the legs reveal no clubbing, cyanosis, or edema.    The distal pulses are intact.  Cranial nerves II - XII are intact.  Motor and sensory functions are intact.  The gait is normal.  LABORATORY DATA:   Assessment / Plan: 1. Coronary disease status post redo CABG. Remote anterior myocardial infarction. Clinically doing very well. Continue his current medical therapy.  2. Dyslipidemia. Patient is on statin, niacin, and fish oil.  3. Hypertension, well controlled.

## 2012-04-19 NOTE — Patient Instructions (Signed)
Continue your current therapy  I will see you in 6 months.   

## 2012-08-09 ENCOUNTER — Other Ambulatory Visit: Payer: Self-pay

## 2012-08-09 MED ORDER — LOSARTAN POTASSIUM-HCTZ 100-12.5 MG PO TABS: 1.0000 | ORAL_TABLET | Freq: Every day | ORAL | Status: DC

## 2012-08-09 MED ORDER — CLOPIDOGREL BISULFATE 75 MG PO TABS: 75.0000 mg | ORAL_TABLET | Freq: Every day | ORAL | Status: DC

## 2012-08-09 NOTE — Telephone Encounter (Signed)
Patient Instructions  Continue your current therapy  I will see you in 6 months.  Chart Reviewed By  Cheryl Johnson D Pugh, LPN  on 04/19/2012 12:36 PM     Previous Visit     Provider Department Encounter #  03/09/2012  1:45 PM Peter Jordan, MD [facility] [id number]        

## 2012-08-09 NOTE — Telephone Encounter (Signed)
Patient Instructions  Continue your current therapy  I will see you in 6 months.  Chart Reviewed By  Charna Elizabeth, LPN  on 1/61/0960 12:36 PM     Previous Visit     Provider Department Encounter #  03/09/2012  1:45 PM Peter Swaziland, MD [facility] [id number]

## 2012-11-15 ENCOUNTER — Encounter: Payer: Self-pay | Admitting: Cardiology

## 2012-11-15 ENCOUNTER — Ambulatory Visit (INDEPENDENT_AMBULATORY_CARE_PROVIDER_SITE_OTHER): Payer: Medicare Other | Admitting: Cardiology

## 2012-11-15 VITALS — BP 136/76 | HR 66 | Ht 70.0 in | Wt 180.2 lb

## 2012-11-15 DIAGNOSIS — I10 Essential (primary) hypertension: Secondary | ICD-10-CM

## 2012-11-15 DIAGNOSIS — E785 Hyperlipidemia, unspecified: Secondary | ICD-10-CM

## 2012-11-15 DIAGNOSIS — I252 Old myocardial infarction: Secondary | ICD-10-CM

## 2012-11-15 DIAGNOSIS — Z951 Presence of aortocoronary bypass graft: Secondary | ICD-10-CM

## 2012-11-15 DIAGNOSIS — I2510 Atherosclerotic heart disease of native coronary artery without angina pectoris: Secondary | ICD-10-CM

## 2012-11-15 NOTE — Progress Notes (Signed)
[name] Date of Birth: 01-Jun-1940 Medical Record #[id number]  History of Present Illness: [name] is seen today for followup. He has a history of coronary disease and is status post redo coronary bypass surgery in 2005. He has had an old anterior myocardial infarction. He reports that in June his serum sodium level dropped to 125. He started eating a lot of salt in his sodium level actually decreased further. His HCTZ was then discontinued and since then his sodium has come up to 135. He has noticed a little more swelling in his left leg. He denies any significant dyspnea or chest pain. He has been relatively inactive. His blood pressure has gone up to 155-160 in the morning. Current Outpatient Prescriptions on File Prior to Visit  Medication Sig Dispense Refill  . ALPRAZolam (XANAX) 0.5 MG tablet Take 0.5 mg by mouth 3 (three) times daily as needed.        Marland Kitchen aspirin 81 MG tablet Take 81 mg by mouth daily.        . carvedilol (COREG) 12.5 MG tablet TAKE 1 TABLET TWICE A DAY  180 tablet  2  . clopidogrel (PLAVIX) 75 MG tablet Take 1 tablet (75 mg total) by mouth daily.  90 tablet  2  . glimepiride (AMARYL) 4 MG tablet Take 4 mg by mouth daily before breakfast.       . nitroGLYCERIN (NITROSTAT) 0.4 MG SL tablet Place 1 tablet (0.4 mg total) under the tongue every 5 (five) minutes as needed for chest pain.  25 tablet  3  . pioglitazone (ACTOS) 45 MG tablet Take 45 mg by mouth daily.       . simvastatin (ZOCOR) 80 MG tablet Take 40 mg by mouth at bedtime.        No current facility-administered medications on file prior to visit.    Allergies  Allergen Reactions  . Hctz [Hydrochlorothiazide] Other (See Comments)    Hyponatremia     Past Medical History  Diagnosis Date  . CAD (coronary artery disease)   . History of acute inferior wall MI   . Diabetes mellitus   . HTN (hypertension)   . Dyslipidemia   . 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 BPH (benign prostatic  hyperplasia)   . 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 Hyponatremia     Past Surgical History  Procedure Laterality Date  . Coronary artery bypass graft  redo 2005    free Rima to OM, svg-diag,svg-pda  . Tonsillectomy    . Rotator cuff repair    . Melanoma excision    . Eye surgery      BILATERAL   . Kyphoplasty  02/29/2012    Procedure: KYPHOPLASTY;  Surgeon: Emilee Hero, MD;  Location: [facility] OR;  Service: Orthopedics;  Laterality: Bilateral;  T 10 kyphoplasty    History  Smoking status  . Former Smoker  . Quit date: 02/22/1986  Smokeless tobacco  . Not on file    History  Alcohol Use No    History reviewed. No pertinent family history.  Review of Systems: As noted in history of present illness.  All other systems were reviewed and are negative.  Physical Exam: BP 136/76  Pulse 66  Ht 5\' 10"  (1.778 m)  Wt 180 lb 3.2 oz (81.738 kg)  BMI 25.86 kg/m2  SpO2 97% He is a pleasant white male in no acute distress.The patient is alert and oriented x 3.  The mood  and affect are normal.  The skin is warm and dry.  Color is normal.  The HEENT exam is unremarkable. Sclera are clear. PERRLA. The mucous membranes are moist.  The carotids are 2+ without bruits.  There is no thyromegaly.  There is no JVD.  The lungs are clear.   The heart exam reveals a regular rate with a normal S1 and S2.  There is a grade 1-2/6 systolic ejection murmur the left sternal border.  The PMI is not displaced.   Abdominal exam is nontender. Bowel sounds are positive. There is no hepatosplenomegaly or tenderness.  There are no masses.  Exam of the legs reveal 1-2+ edema in the left lower extremity.    The distal pulses are intact.  Cranial nerves II - XII are intact.  Motor and sensory functions are intact.  The gait is normal.  LABORATORY DATA:   Assessment / Plan: 1. Coronary disease status post redo CABG. Remote anterior myocardial infarction. Clinically doing very well. Continue his current medical  therapy.  2. Dyslipidemia. Patient is on statin, niacin, and fish oil.  3. Hypertension, patient reports increased blood pressure readings since HCTZ was discontinued. I recommended he restrict his sodium intake again. If his blood pressure does not improve in the next couple of weeks we will need to add additional blood pressure medication. Norvasc would seem a reasonable choice but we would need to reduce his dose of simvastatin to 20 mg.  4. 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 Again restrict sodium intake. Wear support hose as needed.

## 2012-11-15 NOTE — Patient Instructions (Addendum)
Restrict your salt intake. Increase your aerobic activity.  If your blood pressure remains high after 2 weeks let me know and we will consider additional therapy  I will see you in 6 months

## 2012-12-05 ENCOUNTER — Telehealth: Payer: Self-pay | Admitting: Cardiology

## 2012-12-05 NOTE — Telephone Encounter (Signed)
New Problem ° ° °

## 2012-12-05 NOTE — Telephone Encounter (Signed)
Spoke with patient who states his blood pressure remains elevated at 150+ systolic, sometimes as high as 160's and around 80 diastolic.  Patient states Dr. Swaziland told him at last ov on 8/21 to notify him if BP remains high and that he would add an additional medication to his regimen.  I reviewed Dr. Elvis Coil note which states: Hypertension, patient reports increased blood pressure readings since HCTZ was discontinued. I recommended he restrict his sodium intake again. If his blood pressure does not improve in the next couple of weeks we will need to add additional blood pressure medication. Norvasc would seem a reasonable choice but we would need to reduce his dose of simvastatin to 20 mg.  I advised patient that Dr. Swaziland is out of the office this week.  Patient would like to get the Rx before going out of town next week.  I advised him that Dr. Elvis Coil primary nurse, Anabel Halon, LPN will be in the office tomorrow and that I would send message to her and that she or I would follow up tomorrow or Friday.  Patient verbalized understanding and agreement with plan of care.

## 2012-12-05 NOTE — Telephone Encounter (Signed)
New Problem  Blood pressure running high// has not dropped. // was told another Bp med would be prescibed.

## 2012-12-06 NOTE — Telephone Encounter (Signed)
Returned call to patient phone rings busy. 

## 2012-12-07 NOTE — Telephone Encounter (Signed)
I would start him on amlodipine 5 mg daily. He will need to reduce Zocor to 20 mg due to interaction. Continue other meds.  Zamyra Allensworth Swaz[place]and MD, [facility]

## 2012-12-07 NOTE — Telephone Encounter (Signed)
Returned call to patient found a cell # in chart # [id number].Patient stated home phone out of order.Patient stated Dr.Jordan was going to add another B/P med if B/P remains elevated.Stated B/P ranging over 150 systolic.Dr.Jordan out of office will check with him on Monday 12/10/12 and call him back.

## 2012-12-07 NOTE — Telephone Encounter (Signed)
Returned call to patient phone rings busy. 

## 2012-12-10 MED ORDER — SIMVASTATIN 20 MG PO TABS: 20.0000 mg | ORAL_TABLET | Freq: Every day | ORAL | Status: DC

## 2012-12-10 MED ORDER — AMLODIPINE BESYLATE 5 MG PO TABS: 5.0000 mg | ORAL_TABLET | Freq: Every day | ORAL | Status: DC

## 2012-12-10 NOTE — Addendum Note (Signed)
Addended by: Meda Klinefelter D on: 12/10/2012 12:29 PM   Modules accepted: Orders, Medications

## 2012-12-10 NOTE — Telephone Encounter (Signed)
Returned call to patient.Dr.Jordan advised to start Amlodipine 5 mg daily.Decrease Zocor to 20 mg daily.Continue other medications.

## 2012-12-31 NOTE — Progress Notes (Signed)
This encounter was created in error - please disregard.

## 2013-02-15 ENCOUNTER — Encounter (INDEPENDENT_AMBULATORY_CARE_PROVIDER_SITE_OTHER): Payer: Self-pay

## 2013-02-15 ENCOUNTER — Ambulatory Visit (INDEPENDENT_AMBULATORY_CARE_PROVIDER_SITE_OTHER): Payer: Medicare Other | Admitting: Urology

## 2013-02-15 DIAGNOSIS — N138 Other obstructive and reflux uropathy: Secondary | ICD-10-CM

## 2013-02-15 DIAGNOSIS — N401 Enlarged prostate with lower urinary tract symptoms: Secondary | ICD-10-CM

## 2013-02-15 DIAGNOSIS — N529 Male erectile dysfunction, unspecified: Secondary | ICD-10-CM

## 2013-05-27 ENCOUNTER — Encounter: Payer: Self-pay | Admitting: Cardiology

## 2013-05-27 ENCOUNTER — Ambulatory Visit (INDEPENDENT_AMBULATORY_CARE_PROVIDER_SITE_OTHER): Payer: Medicare Other | Admitting: Cardiology

## 2013-05-27 VITALS — BP 125/60 | HR 61 | Ht 70.0 in | Wt 179.0 lb

## 2013-05-27 DIAGNOSIS — E785 Hyperlipidemia, unspecified: Secondary | ICD-10-CM

## 2013-05-27 DIAGNOSIS — Z951 Presence of aortocoronary bypass graft: Secondary | ICD-10-CM

## 2013-05-27 DIAGNOSIS — I10 Essential (primary) hypertension: Secondary | ICD-10-CM

## 2013-05-27 DIAGNOSIS — I252 Old myocardial infarction: Secondary | ICD-10-CM

## 2013-05-27 DIAGNOSIS — I2510 Atherosclerotic heart disease of native coronary artery without angina pectoris: Secondary | ICD-10-CM

## 2013-05-27 MED ORDER — AMLODIPINE BESYLATE 5 MG PO TABS: 5.0000 mg | ORAL_TABLET | Freq: Every day | ORAL | Status: DC

## 2013-05-27 MED ORDER — SIMVASTATIN 20 MG PO TABS: 20.0000 mg | ORAL_TABLET | Freq: Every day | ORAL | Status: DC

## 2013-05-27 NOTE — Patient Instructions (Signed)
Continue your current therapy  You need to get more exercise  I will see you in 6 months.

## 2013-05-27 NOTE — Progress Notes (Signed)
[name] Date of Birth: Mar 25, 1941 Medical Record #[id number]  History of Present Illness: [name] is seen today for followup. He has a history of coronary disease and is status post redo coronary bypass surgery in 2005 after emergent stenting of the left main. He has had an old anterior myocardial infarction.  He denies any significant dyspnea or chest pain. He has been relatively inactive but did shovel his driveway after a recent snow. His blood pressure has improved significantly since he was started on amlodipine. He was intolerant to HCTZ due to hyponatremia. His simvastatin was reduced to 20 mg.   Current Outpatient Prescriptions on File Prior to Visit  Medication Sig Dispense Refill  . ALPRAZolam (XANAX) 0.5 MG tablet Take 0.5 mg by mouth 3 (three) times daily as needed.        Marland Kitchen aspirin 81 MG tablet Take 81 mg by mouth daily.        . carvedilol (COREG) 12.5 MG tablet TAKE 1 TABLET TWICE A DAY  180 tablet  2  . clopidogrel (PLAVIX) 75 MG tablet Take 1 tablet (75 mg total) by mouth daily.  90 tablet  2  . glimepiride (AMARYL) 4 MG tablet Take 4 mg by mouth daily before breakfast.       . losartan (COZAAR) 100 MG tablet Take 100 mg by mouth daily.       . nitroGLYCERIN (NITROSTAT) 0.4 MG SL tablet Place 1 tablet (0.4 mg total) under the tongue every 5 (five) minutes as needed for chest pain.  25 tablet  3  . pioglitazone (ACTOS) 45 MG tablet Take 45 mg by mouth daily.        No current facility-administered medications on file prior to visit.    Allergies  Allergen Reactions  . Hctz [Hydrochlorothiazide] Other (See Comments)    Hyponatremia     Past Medical History  Diagnosis Date  . CAD (coronary artery disease)   . History of acute inferior wall MI   . Diabetes mellitus   . HTN (hypertension)   . Dyslipidemia   . 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 BPH (benign prostatic hyperplasia)   . 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 Hyponatremia     Past Surgical  History  Procedure Laterality Date  . Coronary artery bypass graft  redo 2005    free Rima to OM, svg-diag,svg-pda  . Tonsillectomy    . Rotator cuff repair    . Melanoma excision    . Eye surgery      BILATERAL   . Kyphoplasty  02/29/2012    Procedure: KYPHOPLASTY;  Surgeon: Sinclair Ship, MD;  Location: [facility];  Service: Orthopedics;  Laterality: Bilateral;  T 10 kyphoplasty    History  Smoking status  . Former Smoker  . Quit date: 02/22/1986  Smokeless tobacco  . Not on file    History  Alcohol Use No    History reviewed. No pertinent family history.  Review of Systems: As noted in history of present illness.  All other systems were reviewed and are negative.  Physical Exam: BP 125/60  Pulse 61  Ht 5\' 10"  (1.778 m)  Wt 179 lb (81.194 kg)  BMI 25.68 kg/m2 He is a pleasant white male in no acute distress.The patient is alert and oriented x 3.    The skin is warm and dry.  Color is normal.  The HEENT exam is unremarkable. Sclera are clear. PERRLA. The mucous membranes  are moist.  The carotids are 2+ without bruits.  There is no thyromegaly.  There is no JVD.  The lungs are clear.   The heart exam reveals a regular rate with a normal S1 and S2.  There is a grade 2-3/5 systolic ejection murmur the left sternal border.  The PMI is not displaced.   Abdominal exam is nontender. Bowel sounds are positive. There is no hepatosplenomegaly or tenderness.  There are no masses.  Exam of the legs reveal trace edema in the left lower extremity.    The distal pulses are intact.  Cranial nerves II - XII are intact.  Motor and sensory functions are intact.  The gait is normal.  LABORATORY DATA: Ecg shows NSR with old inferior infarct. LVH.  Assessment / Plan: 1. Coronary disease status post redo CABG. Remote anterior myocardial infarction. Clinically doing very well. Continue his current medical therapy.  2. Dyslipidemia. Patient is on statin,and fish oil. He is going to have lab  work with his primary care in May. If he is not at target may need to switch to lipitor or crestor.  3. Hypertension, now well controlled  4. 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 Again restrict sodium intake. Improved on exam.

## 2013-06-11 ENCOUNTER — Other Ambulatory Visit: Payer: Self-pay | Admitting: *Deleted

## 2013-06-11 MED ORDER — CLOPIDOGREL BISULFATE 75 MG PO TABS: 75.0000 mg | ORAL_TABLET | Freq: Every day | ORAL | Status: DC

## 2013-07-12 ENCOUNTER — Other Ambulatory Visit: Payer: Self-pay | Admitting: *Deleted

## 2013-07-12 MED ORDER — NITROGLYCERIN 0.4 MG SL SUBL: 0.4000 mg | SUBLINGUAL_TABLET | SUBLINGUAL | Status: DC | PRN

## 2014-01-24 ENCOUNTER — Ambulatory Visit: Payer: Medicare Other | Admitting: Cardiology

## 2014-01-24 ENCOUNTER — Encounter: Payer: Self-pay | Admitting: Physician Assistant

## 2014-01-24 ENCOUNTER — Ambulatory Visit (INDEPENDENT_AMBULATORY_CARE_PROVIDER_SITE_OTHER): Payer: Medicare Other | Admitting: Physician Assistant

## 2014-01-24 VITALS — BP 112/62 | HR 60 | Ht 69.5 in | Wt 179.4 lb

## 2014-01-24 DIAGNOSIS — E785 Hyperlipidemia, unspecified: Secondary | ICD-10-CM

## 2014-01-24 DIAGNOSIS — I10 Essential (primary) hypertension: Secondary | ICD-10-CM

## 2014-01-24 DIAGNOSIS — I2510 Atherosclerotic heart disease of native coronary artery without angina pectoris: Secondary | ICD-10-CM

## 2014-01-24 NOTE — Patient Instructions (Signed)
Your physician wants you to follow-up in: 6 months with Dr. Jordan. You will receive a reminder letter in the mail two months in advance. If you don't receive a letter, please call our office to schedule the follow-up appointment.  

## 2014-01-24 NOTE — Progress Notes (Signed)
Cardiology Office Note   Date:  01/24/2014   ID:  OLAOLUWA GRIEDER, DOB 10/04/1940, MRN [id number]  PCP:   Melinda Crutch, MD  Cardiologist:  Dr. Peter Martinique     History of Present Illness: [name] is a 73 y.o. male with a history of CAD status post redo bypass surgery in 2005 after emergent stenting of the left main, old anterior MI, diabetes, HTN, HL. Last seen by Dr. Martinique 05/2013.  He returns for FU.  He has been doing well. He denies chest pain. He fell off of a 10 foot ladder about 3 years ago and has had a slow recovery from a T10 compression fracture.  He underwent kyphoplasty in 12/13. He has remained deconditioned and does note dyspnea with exertion with moderate to extreme activities. He denies orthopnea or PND. He has chronic left leg swelling from his SVG harvest in 2005. He denies syncope or near-syncope.   Studies:  - LHC (7/05):  Left main stent patent, proximal LAD 80-90%, mid LAD occluded, ostial D1 90%, proximal circumflex occluded, proximal RCA occluded, LIMA-LAD patent, anterior, inferior, lateral HK, EF 35-40% >> redo CABG (free RIMA-OM, SVG-DX, SVG-PDA)  - Nuclear (8/10):  Inferolateral scar plus ischemia, normal LV function   Recent Labs/Images:  No results found for this basename: NA, K, BUN, CREATININE, ALT, HGB, TSH, LDL, LDLCALC, LDLDIRECT, HDL, BNP, PROBNP,  in the last 8760 hours    Wt Readings from Last 3 Encounters:  05/27/13 179 lb (81.194 kg)  11/15/12 180 lb 3.2 oz (81.738 kg)  04/19/12 173 lb 3.2 oz (78.563 kg)     Past Medical History  Diagnosis Date  . CAD (coronary artery disease)   . History of acute inferior wall MI   . Diabetes mellitus   . HTN (hypertension)   . Dyslipidemia   . 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 BPH (benign prostatic hyperplasia)   . 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 Hyponatremia     Current Outpatient Prescriptions  Medication Sig Dispense Refill  . ALPRAZolam (XANAX) 0.5 MG tablet Take 0.5 mg by  mouth 3 (three) times daily as needed.        Marland Kitchen amLODipine (NORVASC) 5 MG tablet Take 1 tablet (5 mg total) by mouth daily.  90 tablet  3  . aspirin 81 MG tablet Take 81 mg by mouth daily.        . carvedilol (COREG) 12.5 MG tablet TAKE 1 TABLET TWICE A DAY  180 tablet  2  . clopidogrel (PLAVIX) 75 MG tablet Take 1 tablet (75 mg total) by mouth daily.  90 tablet  2  . glimepiride (AMARYL) 4 MG tablet Take 4 mg by mouth daily before breakfast.       . losartan (COZAAR) 100 MG tablet Take 100 mg by mouth daily.       . nitroGLYCERIN (NITROSTAT) 0.4 MG SL tablet Place 1 tablet (0.4 mg total) under the tongue every 5 (five) minutes as needed for chest pain.  25 tablet  3  . pioglitazone (ACTOS) 45 MG tablet Take 45 mg by mouth daily.       . simvastatin (ZOCOR) 20 MG tablet Take 1 tablet (20 mg total) by mouth at bedtime.  90 tablet  3   No current facility-administered medications for this visit.     Allergies:   Hctz   Social History:  The patient  reports that he quit smoking about 27 years  ago. He does not have any smokeless tobacco history on file. He reports that he does not drink alcohol or use illicit drugs.   Family History:  The patient's family history is not on file.   ROS:  Please see the history of present illness.   He has an occasional cough.   All other systems reviewed and negative.    PHYSICAL EXAM: VS:  BP 112/62  Pulse 60  Ht 5' 9.5" (1.765 m)  Wt 179 lb 6.4 oz (81.375 kg)  BMI 26.12 kg/m2 Well nourished, well developed, in no acute distress HEENT: normal Neck:  no JVD Cardiac:  normal S1, S2;  RRR;  5-4/6 systolic murmurLSB (previously described) Lungs:    clear to auscultation bilaterally, no wheezing, rhonchi or rales Abd: soft, nontender, no hepatomegaly Ext:  Trace LLE and no RLE edema Skin: warm and dry Neuro:  CNs 2-12 intact, no focal abnormalities noted  EKG:  NSR, HR 60, normal axis, inferior Q waves, NSSTTW changes      ASSESSMENT AND PLAN:  1.   Coronary Artery Disease:  Doing well without angina.      -  Continue current Rx with ASA, beta blocker, amlodipine, Plavix, statin. 2.  Hypertension:  Controlled.  3.  Hyperlipidemia:  Continue statin. Request recent Lipids fromPCP. 4.  Diabetes mellitus:  FU with PCP.   Disposition:   FU with Dr. Peter Martinique 6 mos.    Signed, Versie Starks, MHS 01/24/2014 11:34 AM    [facility] [address], [place], [place]  [address] Phone: [phone]; Fax: [phone]

## 2014-02-14 ENCOUNTER — Ambulatory Visit (INDEPENDENT_AMBULATORY_CARE_PROVIDER_SITE_OTHER): Payer: Medicare Other | Admitting: Urology

## 2014-02-14 DIAGNOSIS — N401 Enlarged prostate with lower urinary tract symptoms: Secondary | ICD-10-CM

## 2014-02-14 DIAGNOSIS — R351 Nocturia: Secondary | ICD-10-CM

## 2014-02-27 ENCOUNTER — Other Ambulatory Visit: Payer: Self-pay | Admitting: Dermatology

## 2014-06-02 ENCOUNTER — Other Ambulatory Visit: Payer: Self-pay | Admitting: *Deleted

## 2014-06-02 MED ORDER — SIMVASTATIN 20 MG PO TABS: 20.0000 mg | ORAL_TABLET | Freq: Every day | ORAL | Status: DC

## 2014-06-04 ENCOUNTER — Other Ambulatory Visit: Payer: Self-pay

## 2014-06-04 MED ORDER — AMLODIPINE BESYLATE 5 MG PO TABS: 5.0000 mg | ORAL_TABLET | Freq: Every day | ORAL | Status: DC

## 2014-07-25 ENCOUNTER — Emergency Department (HOSPITAL_COMMUNITY): Payer: Medicare Other

## 2014-07-25 ENCOUNTER — Encounter (HOSPITAL_COMMUNITY): Payer: Self-pay

## 2014-07-25 ENCOUNTER — Inpatient Hospital Stay (HOSPITAL_COMMUNITY): Payer: Medicare Other

## 2014-07-25 ENCOUNTER — Inpatient Hospital Stay (HOSPITAL_COMMUNITY)
Admission: EM | Admit: 2014-07-25 | Discharge: 2014-08-10 | DRG: 335 | Disposition: A | Discharge status: Home / Self Care | Payer: Medicare Other | Attending: Internal Medicine | Admitting: Internal Medicine

## 2014-07-25 DIAGNOSIS — E785 Hyperlipidemia, unspecified: Secondary | ICD-10-CM | POA: Diagnosis present

## 2014-07-25 DIAGNOSIS — E1165 Type 2 diabetes mellitus with hyperglycemia: Secondary | ICD-10-CM | POA: Diagnosis not present

## 2014-07-25 DIAGNOSIS — K5669 Other intestinal obstruction: Secondary | ICD-10-CM | POA: Diagnosis present

## 2014-07-25 DIAGNOSIS — K567 Ileus, unspecified: Secondary | ICD-10-CM

## 2014-07-25 DIAGNOSIS — I5042 Chronic combined systolic (congestive) and diastolic (congestive) heart failure: Secondary | ICD-10-CM

## 2014-07-25 DIAGNOSIS — D649 Anemia, unspecified: Secondary | ICD-10-CM | POA: Diagnosis present

## 2014-07-25 DIAGNOSIS — J189 Pneumonia, unspecified organism: Secondary | ICD-10-CM | POA: Diagnosis present

## 2014-07-25 DIAGNOSIS — I10 Essential (primary) hypertension: Secondary | ICD-10-CM | POA: Diagnosis present

## 2014-07-25 DIAGNOSIS — I252 Old myocardial infarction: Secondary | ICD-10-CM

## 2014-07-25 DIAGNOSIS — Z87891 Personal history of nicotine dependence: Secondary | ICD-10-CM

## 2014-07-25 DIAGNOSIS — Z888 Allergy status to other drugs, medicaments and biological substances status: Secondary | ICD-10-CM | POA: Diagnosis not present

## 2014-07-25 DIAGNOSIS — E871 Hypo-osmolality and hyponatremia: Secondary | ICD-10-CM | POA: Diagnosis not present

## 2014-07-25 DIAGNOSIS — K56609 Unspecified intestinal obstruction, unspecified as to partial versus complete obstruction: Secondary | ICD-10-CM | POA: Diagnosis present

## 2014-07-25 DIAGNOSIS — Z79899 Other long term (current) drug therapy: Secondary | ICD-10-CM

## 2014-07-25 DIAGNOSIS — K565 Intestinal adhesions [bands] with obstruction (postprocedural) (postinfection): Principal | ICD-10-CM | POA: Diagnosis present

## 2014-07-25 DIAGNOSIS — I2510 Atherosclerotic heart disease of native coronary artery without angina pectoris: Secondary | ICD-10-CM | POA: Diagnosis present

## 2014-07-25 DIAGNOSIS — Z951 Presence of aortocoronary bypass graft: Secondary | ICD-10-CM

## 2014-07-25 DIAGNOSIS — E876 Hypokalemia: Secondary | ICD-10-CM | POA: Diagnosis not present

## 2014-07-25 DIAGNOSIS — K56600 Partial intestinal obstruction, unspecified as to cause: Secondary | ICD-10-CM | POA: Diagnosis present

## 2014-07-25 DIAGNOSIS — N400 Enlarged prostate without lower urinary tract symptoms: Secondary | ICD-10-CM | POA: Diagnosis present

## 2014-07-25 DIAGNOSIS — I5043 Acute on chronic combined systolic (congestive) and diastolic (congestive) heart failure: Secondary | ICD-10-CM | POA: Diagnosis present

## 2014-07-25 DIAGNOSIS — E119 Type 2 diabetes mellitus without complications: Secondary | ICD-10-CM

## 2014-07-25 DIAGNOSIS — I5033 Acute on chronic diastolic (congestive) heart failure: Secondary | ICD-10-CM | POA: Diagnosis not present

## 2014-07-25 DIAGNOSIS — Z7902 Long term (current) use of antithrombotics/antiplatelets: Secondary | ICD-10-CM | POA: Diagnosis not present

## 2014-07-25 DIAGNOSIS — R0600 Dyspnea, unspecified: Secondary | ICD-10-CM | POA: Diagnosis not present

## 2014-07-25 DIAGNOSIS — J90 Pleural effusion, not elsewhere classified: Secondary | ICD-10-CM

## 2014-07-25 HISTORY — DX: Acute on chronic diastolic (congestive) heart failure: I50.33

## 2014-07-25 LAB — COMPREHENSIVE METABOLIC PANEL
ALBUMIN: 4.1 g/dL (ref 3.5–5.2)
ALT: 18 U/L (ref 0–53)
AST: 27 U/L (ref 0–37)
Alkaline Phosphatase: 109 U/L (ref 39–117)
Anion gap: 13 (ref 5–15)
BUN: 18 mg/dL (ref 6–23)
CALCIUM: 9.7 mg/dL (ref 8.4–10.5)
CO2: 22 mmol/L (ref 19–32)
Chloride: 97 mmol/L (ref 96–112)
Creatinine, Ser: 1.06 mg/dL (ref 0.50–1.35)
GFR, EST AFRICAN AMERICAN: 78 mL/min — AB (ref >90)
GFR, EST NON AFRICAN AMERICAN: 68 mL/min — AB (ref >90)
GLUCOSE: 89 mg/dL (ref 70–99)
POTASSIUM: 3.3 mmol/L — AB (ref 3.5–5.1)
Sodium: 132 mmol/L — ABNORMAL LOW (ref 135–145)
Total Bilirubin: 1.6 mg/dL — ABNORMAL HIGH (ref 0.3–1.2)
Total Protein: 7.5 g/dL (ref 6.0–8.3)

## 2014-07-25 LAB — CBC WITH DIFFERENTIAL/PLATELET
BASOS PCT: 0 % (ref 0–1)
Basophils Absolute: 0 10*3/uL (ref 0.0–0.1)
EOS ABS: 0 10*3/uL (ref 0.0–0.7)
EOS PCT: 0 % (ref 0–5)
HCT: 30.5 % — ABNORMAL LOW (ref 39.0–52.0)
Hemoglobin: 11 g/dL — ABNORMAL LOW (ref 13.0–17.0)
LYMPHS ABS: 0.7 10*3/uL (ref 0.7–4.0)
Lymphocytes Relative: 7 % — ABNORMAL LOW (ref 12–46)
MCH: 31.3 pg (ref 26.0–34.0)
MCHC: 36.1 g/dL — ABNORMAL HIGH (ref 30.0–36.0)
MCV: 86.6 fL (ref 78.0–100.0)
MONO ABS: 0.8 10*3/uL (ref 0.1–1.0)
MONOS PCT: 8 % (ref 3–12)
NEUTROS ABS: 8 10*3/uL — AB (ref 1.7–7.7)
NEUTROS PCT: 85 % — AB (ref 43–77)
PLATELETS: 175 10*3/uL (ref 150–400)
RBC: 3.52 MIL/uL — ABNORMAL LOW (ref 4.22–5.81)
RDW: 14.8 % (ref 11.5–15.5)
WBC: 9.5 10*3/uL (ref 4.0–10.5)

## 2014-07-25 LAB — URINE MICROSCOPIC-ADD ON

## 2014-07-25 LAB — URINALYSIS, ROUTINE W REFLEX MICROSCOPIC
Bilirubin Urine: NEGATIVE
Glucose, UA: NEGATIVE mg/dL
HGB URINE DIPSTICK: NEGATIVE
Ketones, ur: >80 mg/dL — AB
LEUKOCYTES UA: NEGATIVE
Nitrite: NEGATIVE
PH: 8 (ref 5.0–8.0)
PROTEIN: 100 mg/dL — AB
Specific Gravity, Urine: 1.025 (ref 1.005–1.030)
UROBILINOGEN UA: 1 mg/dL (ref 0.0–1.0)

## 2014-07-25 LAB — LIPASE, BLOOD: Lipase: 31 U/L (ref 11–59)

## 2014-07-25 LAB — GLUCOSE, CAPILLARY: Glucose-Capillary: 155 mg/dL — ABNORMAL HIGH (ref 70–99)

## 2014-07-25 LAB — I-STAT TROPONIN, ED: TROPONIN I, POC: 0.01 ng/mL (ref 0.00–0.08)

## 2014-07-25 MED ORDER — CLOPIDOGREL BISULFATE 75 MG PO TABS
75.0000 mg | ORAL_TABLET | Freq: Every day | ORAL | Status: DC
  Administered 2014-07-26 – 2014-07-28 (×3): 75 mg via ORAL
  Filled 2014-07-25 (×3): qty 1

## 2014-07-25 MED ORDER — SODIUM CHLORIDE 0.9 % IV SOLN
1000.0000 mL | Freq: Once | INTRAVENOUS | Status: AC
  Administered 2014-07-25: 1000 mL via INTRAVENOUS

## 2014-07-25 MED ORDER — ONDANSETRON 4 MG PO TBDP
8.0000 mg | ORAL_TABLET | Freq: Once | ORAL | Status: AC
  Administered 2014-07-25: 8 mg via ORAL

## 2014-07-25 MED ORDER — DEXTROSE 5 % IV SOLN
500.0000 mg | INTRAVENOUS | Status: DC
  Administered 2014-07-26 – 2014-08-03 (×9): 500 mg via INTRAVENOUS
  Filled 2014-07-25 (×11): qty 500

## 2014-07-25 MED ORDER — INSULIN ASPART 100 UNIT/ML ~~LOC~~ SOLN
0.0000 [IU] | Freq: Three times a day (TID) | SUBCUTANEOUS | Status: DC
  Administered 2014-07-26 (×2): 3 [IU] via SUBCUTANEOUS

## 2014-07-25 MED ORDER — HYDROMORPHONE HCL 1 MG/ML IJ SOLN
0.5000 mg | INTRAMUSCULAR | Status: DC | PRN
  Administered 2014-07-26 – 2014-08-02 (×3): 0.5 mg via INTRAVENOUS
  Filled 2014-07-25 (×3): qty 1

## 2014-07-25 MED ORDER — SODIUM CHLORIDE 0.9 % IV SOLN
1000.0000 mL | INTRAVENOUS | Status: DC
  Administered 2014-07-25: 1000 mL via INTRAVENOUS

## 2014-07-25 MED ORDER — ALPRAZOLAM 0.5 MG PO TABS
0.5000 mg | ORAL_TABLET | Freq: Three times a day (TID) | ORAL | Status: DC | PRN
  Administered 2014-08-09 (×2): 0.5 mg via ORAL
  Filled 2014-07-25 (×2): qty 1

## 2014-07-25 MED ORDER — HYDROMORPHONE HCL 1 MG/ML IJ SOLN
0.5000 mg | INTRAMUSCULAR | Status: DC | PRN
  Administered 2014-07-25 (×2): 0.5 mg via INTRAVENOUS
  Filled 2014-07-25 (×2): qty 1

## 2014-07-25 MED ORDER — NITROGLYCERIN 0.4 MG SL SUBL: 0.4000 mg | SUBLINGUAL_TABLET | SUBLINGUAL | Status: DC | PRN

## 2014-07-25 MED ORDER — ACETAMINOPHEN 325 MG PO TABS: 650.0000 mg | ORAL_TABLET | Freq: Four times a day (QID) | ORAL | Status: DC | PRN

## 2014-07-25 MED ORDER — DEXTROSE-NACL 5-0.45 % IV SOLN: INTRAVENOUS | Status: DC

## 2014-07-25 MED ORDER — SIMVASTATIN 20 MG PO TABS
20.0000 mg | ORAL_TABLET | Freq: Every day | ORAL | Status: DC
Start: 2014-07-26 — End: 2014-08-10
  Administered 2014-07-28 – 2014-08-09 (×12): 20 mg via ORAL
  Filled 2014-07-25 (×14): qty 1

## 2014-07-25 MED ORDER — ASPIRIN EC 81 MG PO TBEC
81.0000 mg | DELAYED_RELEASE_TABLET | Freq: Every day | ORAL | Status: DC
  Administered 2014-07-26 – 2014-08-10 (×14): 81 mg via ORAL
  Filled 2014-07-25 (×14): qty 1

## 2014-07-25 MED ORDER — ONDANSETRON HCL 4 MG/2ML IJ SOLN
4.0000 mg | Freq: Once | INTRAMUSCULAR | Status: AC
  Administered 2014-07-25: 4 mg via INTRAVENOUS
  Filled 2014-07-25: qty 2

## 2014-07-25 MED ORDER — CARVEDILOL 12.5 MG PO TABS
12.5000 mg | ORAL_TABLET | Freq: Two times a day (BID) | ORAL | Status: DC
  Administered 2014-07-26 – 2014-08-10 (×31): 12.5 mg via ORAL
  Filled 2014-07-25 (×31): qty 1

## 2014-07-25 MED ORDER — ONDANSETRON 4 MG PO TBDP
ORAL_TABLET | ORAL | Status: AC
  Filled 2014-07-25: qty 2

## 2014-07-25 MED ORDER — POTASSIUM CHLORIDE IN NACL 20-0.9 MEQ/L-% IV SOLN
INTRAVENOUS | Status: AC
  Administered 2014-07-26: via INTRAVENOUS
  Filled 2014-07-25: qty 1000

## 2014-07-25 MED ORDER — IOHEXOL 300 MG/ML  SOLN
100.0000 mL | Freq: Once | INTRAMUSCULAR | Status: AC | PRN
  Administered 2014-07-25: 100 mL via INTRAVENOUS

## 2014-07-25 MED ORDER — LOSARTAN POTASSIUM 50 MG PO TABS
100.0000 mg | ORAL_TABLET | Freq: Every day | ORAL | Status: DC
Start: 2014-07-26 — End: 2014-08-10
  Administered 2014-07-26 – 2014-08-10 (×16): 100 mg via ORAL
  Filled 2014-07-25 (×18): qty 2

## 2014-07-25 MED ORDER — IOHEXOL 300 MG/ML  SOLN
25.0000 mL | Freq: Once | INTRAMUSCULAR | Status: AC | PRN
  Administered 2014-07-25: 25 mL via ORAL

## 2014-07-25 MED ORDER — ACETAMINOPHEN 650 MG RE SUPP: 650.0000 mg | Freq: Four times a day (QID) | RECTAL | Status: DC | PRN

## 2014-07-25 MED ORDER — DEXTROSE 5 % IV SOLN
1.0000 g | INTRAVENOUS | Status: DC
  Administered 2014-07-26 – 2014-08-02 (×9): 1 g via INTRAVENOUS
  Filled 2014-07-25 (×11): qty 10

## 2014-07-25 MED ORDER — AMLODIPINE BESYLATE 5 MG PO TABS
5.0000 mg | ORAL_TABLET | Freq: Every day | ORAL | Status: DC
  Administered 2014-07-26 – 2014-08-10 (×16): 5 mg via ORAL
  Filled 2014-07-25 (×16): qty 1

## 2014-07-25 NOTE — ED Provider Notes (Signed)
CSN: [id number]     Arrival date & time 07/25/14  1735 History   First MD Initiated Contact with Patient 07/25/14 1851     Chief Complaint  Patient presents with  . Abdominal Pain    Patient is a 74 y.o. male presenting with abdominal pain. The history is provided by the patient.  Abdominal Pain Pain location:  Periumbilical Pain quality: aching and sharp   Pain radiates to:  Does not radiate Pain severity:  Severe Onset quality:  Gradual Duration: Noticed it when he woke up at 0930.  He had been up earlier to let the dog out and it wasnt there. Timing:  Constant Progression:  Worsening Associated symptoms: anorexia and vomiting   Associated symptoms: no diarrhea, no dysuria and no fever     Past Medical History  Diagnosis Date  . CAD (coronary artery disease)   . History of acute inferior wall MI   . Diabetes mellitus   . HTN (hypertension)   . Dyslipidemia   . 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 BPH (benign prostatic hyperplasia)   . 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 Hyponatremia    Past Surgical History  Procedure Laterality Date  . Coronary artery bypass graft  redo 2005    free Rima to OM, svg-diag,svg-pda  . Tonsillectomy    . Rotator cuff repair    . Melanoma excision    . Eye surgery      BILATERAL   . Kyphoplasty  02/29/2012    Procedure: KYPHOPLASTY;  Surgeon: Sinclair Ship, MD;  Location: [facility];  Service: Orthopedics;  Laterality: Bilateral;  T 10 kyphoplasty   No family history on file. History  Substance Use Topics  . Smoking status: Former Smoker    Quit date: 02/22/1986  . Smokeless tobacco: Not on file  . Alcohol Use: No    Review of Systems  Constitutional: Negative for fever.  Gastrointestinal: Positive for vomiting, abdominal pain and anorexia. Negative for diarrhea.  Genitourinary: Negative for dysuria.  All other systems reviewed and are negative.     Allergies  Hctz  Home Medications   Prior to Admission medications    Medication Sig Start Date End Date Taking? Authorizing Provider  ALPRAZolam Duanne Moron) 0.5 MG tablet Take 0.5 mg by mouth 3 (three) times daily as needed for anxiety.    Yes Historical Provider, MD  amLODipine (NORVASC) 5 MG tablet Take 1 tablet (5 mg total) by mouth daily. 06/04/14  Yes Peter M Martinique, MD  aspirin 81 MG tablet Take 81 mg by mouth daily.     Yes Historical Provider, MD  carvedilol (COREG) 12.5 MG tablet TAKE 1 TABLET TWICE A DAY 02/22/11  Yes Peter M Martinique, MD  clopidogrel (PLAVIX) 75 MG tablet Take 1 tablet (75 mg total) by mouth daily. 06/11/13  Yes Peter M Martinique, MD  glimepiride (AMARYL) 4 MG tablet Take 4 mg by mouth daily before breakfast.    Yes Historical Provider, MD  losartan (COZAAR) 100 MG tablet Take 100 mg by mouth daily.  11/05/12  Yes Historical Provider, MD  nitroGLYCERIN (NITROSTAT) 0.4 MG SL tablet Place 1 tablet (0.4 mg total) under the tongue every 5 (five) minutes as needed for chest pain. 07/12/13  Yes Peter M Martinique, MD  pioglitazone (ACTOS) 45 MG tablet Take 45 mg by mouth daily.  04/13/12  Yes Historical Provider, MD  simvastatin (ZOCOR) 20 MG tablet Take 1 tablet (20 mg total) by mouth at  bedtime. 06/02/14  Yes Peter M Martinique, MD   BP 147/61 mmHg  Pulse 75  Temp(Src)   Resp 20  Ht 5\' 9"  (1.753 m)  Wt 174 lb (78.926 kg)  BMI 25.68 kg/m2  SpO2 92% Physical Exam  Constitutional: He appears well-developed and well-nourished. No distress.  HENT:  Head: Normocephalic and atraumatic.  Right Ear: External ear normal.  Left Ear: External ear normal.  Eyes: Conjunctivae are normal. Right eye exhibits no discharge. Left eye exhibits no discharge. No scleral icterus.  Neck: Neck supple. No tracheal deviation present.  Cardiovascular: Normal rate, regular rhythm and intact distal pulses.   Pulmonary/Chest: Effort normal and breath sounds normal. No stridor. No respiratory distress. He has no wheezes. He has no rales.  Abdominal: Soft. He exhibits no distension and  no mass. Bowel sounds are increased. There is tenderness in the right lower quadrant. There is no rebound and no guarding. No hernia.  Musculoskeletal: He exhibits no edema or tenderness.  Neurological: He is alert. He has normal strength. No cranial nerve deficit (no facial droop, extraocular movements intact, no slurred speech) or sensory deficit. He exhibits normal muscle tone. He displays no seizure activity. Coordination normal.  Skin: Skin is warm and dry. No rash noted.  Psychiatric: He has a normal mood and affect.  Nursing note and vitals reviewed.   ED Course  Procedures (including critical care time) Labs Review Labs Reviewed  CBC WITH DIFFERENTIAL/PLATELET - Abnormal; Notable for the following:    RBC 3.52 (*)    Hemoglobin 11.0 (*)    HCT 30.5 (*)    MCHC 36.1 (*)    Neutrophils Relative % 85 (*)    Neutro Abs 8.0 (*)    Lymphocytes Relative 7 (*)    All other components within normal limits  COMPREHENSIVE METABOLIC PANEL - Abnormal; Notable for the following:    Sodium 132 (*)    Potassium 3.3 (*)    Total Bilirubin 1.6 (*)    GFR calc non Af Amer 68 (*)    GFR calc Af Amer 78 (*)    All other components within normal limits  URINALYSIS, ROUTINE W REFLEX MICROSCOPIC - Abnormal; Notable for the following:    Ketones, ur >80 (*)    Protein, ur 100 (*)    All other components within normal limits  LIPASE, BLOOD  URINE MICROSCOPIC-ADD ON  I-STAT TROPOININ, ED    Imaging Review Ct Abdomen Pelvis W Contrast  07/25/2014   CLINICAL DATA:  Mid to lower abdominal pain since 12 hours ago. Vomiting.  EXAM: CT ABDOMEN AND PELVIS WITH CONTRAST  TECHNIQUE: Multidetector CT imaging of the abdomen and pelvis was performed using the standard protocol following bolus administration of intravenous contrast.  CONTRAST:  157mL OMNIPAQUE IOHEXOL 300 MG/ML  SOLN  COMPARISON:  None.  FINDINGS: There is a left effusion layering dependently with patchy density in the dependent left lung  that could be atelectasis or pneumonia.  The liver shows a pattern raising the possibility of early cirrhosis. There are 2 benign calcifications in the left lobe. There are calcified stones in the gallbladder neck. No stone is seen along the course of the common duct. The spleen is normal. There are bilateral low-density adrenal adenomas. The kidneys are normal. The aorta and its branch vessels show extensive atherosclerotic disease. No aneurysm. Bladder, prostate gland and seminal vesicles are unremarkable.  There are dilated fluid and air-filled loops of small intestine consistent with partial small bowel obstruction. There  is a caliber change at the mid ileum. Specific obstructing lesion is not identified. There is free intraperitoneal fluid but no free intraperitoneal air are identified. Old vertebral augmentation at T10. Chronic bilateral pars defects at L5.  IMPRESSION: Partial small bowel obstruction. Level of the obstruction is probably mid ileum. Specific etiology not demonstrated.  Free fluid which could relate to the small bowel obstruction or could be secondary to cirrhosis. The patient does appear to have cirrhosis of the liver.  Gallstones.  Advanced widespread atherosclerosis.  Left pleural effusion. Atelectasis and/or pneumonia in the dependent left lower lobe.   Electronically Signed   By: Nelson Chimes M.D.   On: 07/25/2014 21:28     EKG Interpretation   Date/Time:  Friday July 25 2014 18:21:09 EDT Ventricular Rate:  76 PR Interval:  192 QRS Duration: 100 QT Interval:  414 QTC Calculation: 465 R Axis:   83 Text Interpretation:  Sinus rhythm with Premature atrial complexes with  Abberant conduction Otherwise normal ECG st and t wave changes on prior  ecg resolved Confirmed by Charlee Whitebread  MD-J, Sipriano Fendley ([id number]) on 07/25/2014 6:58:31 PM     Medications  0.9 %  sodium chloride infusion (0 mLs Intravenous Stopped 07/25/14 2057)    Followed by  0.9 %  sodium chloride infusion (1,000 mLs  Intravenous New Bag/Given 07/25/14 1930)  HYDROmorphone (DILAUDID) injection 0.5 mg (0.5 mg Intravenous Given 07/25/14 1932)  ondansetron (ZOFRAN-ODT) disintegrating tablet 8 mg (8 mg Oral Given 07/25/14 1823)  ondansetron (ZOFRAN) injection 4 mg (4 mg Intravenous Given 07/25/14 1932)  iohexol (OMNIPAQUE) 300 MG/ML solution 25 mL (25 mLs Oral Contrast Given 07/25/14 1930)  iohexol (OMNIPAQUE) 300 MG/ML solution 100 mL (100 mLs Intravenous Contrast Given 07/25/14 2038)    MDM   Final diagnoses:  Partial small bowel obstruction    Patient CT scan suggests a partial small bowel obstruction. Because of this is unclear. Patient denies any prior abdominal surgeries. I have ordered a nasogastric tube. Plan on admission for further treatment.  Spoke with hospitalist service and general surgery.    Dorie Rank, MD 07/26/14 [phone]

## 2014-07-25 NOTE — ED Notes (Signed)
Pt. Developed n/v since 0930 this am.  Pt. Also having abdominal pain .  Pt. Denies any chest pain or sob.  Skin is p/w/d,  GCS 15

## 2014-07-25 NOTE — H&P (Addendum)
[name] is an 74 y.o. male.    Dr. Harrington Challenger (pcp)  Chief Complaint: abdominal pain HPI: 74 yo male with CAD, dm2, hypertension, hyperlipidemia,  Apparently c/o abdominal pain , periumbical beginning this am. Pt states the pain was sharp,  + n/v  Denies fever, chills,  Diarrhea, brbpr, black stool, dysuria, hematuria.  Pt was brought to ED for evaluation and found to have potassium of 3.3, and partial sbo.  Pt will be admitted for sbo.  ED has consulted surgery , we appreciate their input.   Past Medical History  Diagnosis Date  . CAD (coronary artery disease)   . History of acute inferior wall MI   . Diabetes mellitus   . HTN (hypertension)   . Dyslipidemia   . 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 BPH (benign prostatic hyperplasia)   . 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 Hyponatremia     Past Surgical History  Procedure Laterality Date  . Coronary artery bypass graft  redo 2005    free Rima to OM, svg-diag,svg-pda  . Tonsillectomy    . Rotator cuff repair    . Melanoma excision    . Eye surgery      BILATERAL   . Kyphoplasty  02/29/2012    Procedure: KYPHOPLASTY;  Surgeon: Sinclair Ship, MD;  Location: [facility];  Service: Orthopedics;  Laterality: Bilateral;  T 10 kyphoplasty    Family History  Problem Relation Age of Onset  . Dementia Mother    Social History:  reports that he quit smoking about 28 years ago. He does not have any smokeless tobacco history on file. He reports that he does not drink alcohol or use illicit drugs.  Allergies:  Allergies  Allergen Reactions  . Hctz [Hydrochlorothiazide] Other (See Comments)    Hyponatremia      (Not in a hospital admission)  Results for orders placed or performed during the hospital encounter of 07/25/14 (from the past 48 hour(s))  CBC with Differential     Status: Abnormal   Collection Time: 07/25/14  6:28 PM  Result Value Ref Range   WBC 9.5 4.0 - 10.5 K/uL   RBC 3.52 (L) 4.22 - 5.81 MIL/uL   Hemoglobin  11.0 (L) 13.0 - 17.0 g/dL   HCT 30.5 (L) 39.0 - 52.0 %   MCV 86.6 78.0 - 100.0 fL   MCH 31.3 26.0 - 34.0 pg   MCHC 36.1 (H) 30.0 - 36.0 g/dL   RDW 14.8 11.5 - 15.5 %   Platelets 175 150 - 400 K/uL   Neutrophils Relative % 85 (H) 43 - 77 %   Neutro Abs 8.0 (H) 1.7 - 7.7 K/uL   Lymphocytes Relative 7 (L) 12 - 46 %   Lymphs Abs 0.7 0.7 - 4.0 K/uL   Monocytes Relative 8 3 - 12 %   Monocytes Absolute 0.8 0.1 - 1.0 K/uL   Eosinophils Relative 0 0 - 5 %   Eosinophils Absolute 0.0 0.0 - 0.7 K/uL   Basophils Relative 0 0 - 1 %   Basophils Absolute 0.0 0.0 - 0.1 K/uL  Comprehensive metabolic panel     Status: Abnormal   Collection Time: 07/25/14  6:28 PM  Result Value Ref Range   Sodium 132 (L) 135 - 145 mmol/L   Potassium 3.3 (L) 3.5 - 5.1 mmol/L   Chloride 97 96 - 112 mmol/L   CO2 22 19 - 32 mmol/L   Glucose, Bld 89 70 -  99 mg/dL   BUN 18 6 - 23 mg/dL   Creatinine, Ser 1.06 0.50 - 1.35 mg/dL   Calcium 9.7 8.4 - 10.5 mg/dL   Total Protein 7.5 6.0 - 8.3 g/dL   Albumin 4.1 3.5 - 5.2 g/dL   AST 27 0 - 37 U/L   ALT 18 0 - 53 U/L   Alkaline Phosphatase 109 39 - 117 U/L   Total Bilirubin 1.6 (H) 0.3 - 1.2 mg/dL   GFR calc non Af Amer 68 (L) >90 mL/min   GFR calc Af Amer 78 (L) >90 mL/min    Comment: (NOTE) The eGFR has been calculated using the CKD EPI equation. This calculation has not been validated in all clinical situations. eGFR's persistently <90 mL/min signify possible Chronic Kidney Disease.    Anion gap 13 5 - 15  Lipase, blood     Status: None   Collection Time: 07/25/14  6:28 PM  Result Value Ref Range   Lipase 31 11 - 59 U/L  I-stat troponin, ED (only if pt is 74 y.o. or older & pain is above umbilicus) - do not order at [facility]     Status: None   Collection Time: 07/25/14  7:07 PM  Result Value Ref Range   Troponin i, poc 0.01 0.00 - 0.08 ng/mL   Comment 3            Comment: Due to the release kinetics of cTnI, a negative result within the first hours of the onset of  symptoms does not rule out myocardial infarction with certainty. If myocardial infarction is still suspected, repeat the test at appropriate intervals.   Urinalysis with microscopic     Status: Abnormal   Collection Time: 07/25/14  9:07 PM  Result Value Ref Range   Color, Urine YELLOW YELLOW   APPearance CLEAR CLEAR   Specific Gravity, Urine 1.025 1.005 - 1.030   pH 8.0 5.0 - 8.0   Glucose, UA NEGATIVE NEGATIVE mg/dL   Hgb urine dipstick NEGATIVE NEGATIVE   Bilirubin Urine NEGATIVE NEGATIVE   Ketones, ur >80 (A) NEGATIVE mg/dL   Protein, ur 100 (A) NEGATIVE mg/dL   Urobilinogen, UA 1.0 0.0 - 1.0 mg/dL   Nitrite NEGATIVE NEGATIVE   Leukocytes, UA NEGATIVE NEGATIVE  Urine microscopic-add on     Status: None   Collection Time: 07/25/14  9:07 PM  Result Value Ref Range   Squamous Epithelial / LPF RARE RARE   WBC, UA 0-2 <3 WBC/hpf   RBC / HPF 0-2 <3 RBC/hpf   Bacteria, UA RARE RARE   Ct Abdomen Pelvis W Contrast  07/25/2014   CLINICAL DATA:  Mid to lower abdominal pain since 12 hours ago. Vomiting.  EXAM: CT ABDOMEN AND PELVIS WITH CONTRAST  TECHNIQUE: Multidetector CT imaging of the abdomen and pelvis was performed using the standard protocol following bolus administration of intravenous contrast.  CONTRAST:  149m OMNIPAQUE IOHEXOL 300 MG/ML  SOLN  COMPARISON:  None.  FINDINGS: There is a left effusion layering dependently with patchy density in the dependent left lung that could be atelectasis or pneumonia.  The liver shows a pattern raising the possibility of early cirrhosis. There are 2 benign calcifications in the left lobe. There are calcified stones in the gallbladder neck. No stone is seen along the course of the common duct. The spleen is normal. There are bilateral low-density adrenal adenomas. The kidneys are normal. The aorta and its branch vessels show extensive atherosclerotic disease. No aneurysm. Bladder, prostate gland  and seminal vesicles are unremarkable.  There are  dilated fluid and air-filled loops of small intestine consistent with partial small bowel obstruction. There is a caliber change at the mid ileum. Specific obstructing lesion is not identified. There is free intraperitoneal fluid but no free intraperitoneal air are identified. Old vertebral augmentation at T10. Chronic bilateral pars defects at L5.  IMPRESSION: Partial small bowel obstruction. Level of the obstruction is probably mid ileum. Specific etiology not demonstrated.  Free fluid which could relate to the small bowel obstruction or could be secondary to cirrhosis. The patient does appear to have cirrhosis of the liver.  Gallstones.  Advanced widespread atherosclerosis.  Left pleural effusion. Atelectasis and/or pneumonia in the dependent left lower lobe.   Electronically Signed   By: Nelson Chimes M.D.   On: 07/25/2014 21:28    Review of Systems  Constitutional: Negative.   HENT: Negative.   Eyes: Negative.   Respiratory: Negative.   Cardiovascular: Negative.   Gastrointestinal: Positive for nausea, vomiting and abdominal pain. Negative for heartburn, diarrhea, constipation, blood in stool and melena.  Genitourinary: Negative.   Musculoskeletal: Negative.   Skin: Negative.   Neurological: Negative.   Endo/Heme/Allergies: Negative.   Psychiatric/Behavioral: Negative.     Blood pressure 147/61, pulse 75, resp. rate 20, height '5\' 9"'  (1.753 m), weight 78.926 kg (174 lb), SpO2 92 %. Physical Exam  Constitutional: He is oriented to person, place, and time. He appears well-developed and well-nourished.  HENT:  Head: Normocephalic and atraumatic.  Mouth/Throat: No oropharyngeal exudate.  Eyes: Conjunctivae and EOM are normal. Pupils are equal, round, and reactive to light. No scleral icterus.  Neck: Normal range of motion. Neck supple. No JVD present. No tracheal deviation present. No thyromegaly present.  Cardiovascular: Normal rate and regular rhythm.  Exam reveals no gallop and no  friction rub.   No murmur heard. Respiratory: Effort normal and breath sounds normal. No respiratory distress. He has no wheezes. He has no rales.  GI: Soft. Bowel sounds are normal. He exhibits no distension. There is no tenderness. There is no rebound and no guarding.  Musculoskeletal: Normal range of motion. He exhibits no edema or tenderness.  Lymphadenopathy:    He has no cervical adenopathy.  Neurological: He is alert and oriented to person, place, and time. He has normal reflexes. He displays normal reflexes. No cranial nerve deficit. He exhibits normal muscle tone. Coordination normal.  Skin: Skin is warm and dry. No rash noted. No erythema. No pallor.  Psychiatric: He has a normal mood and affect. His behavior is normal. Judgment and thought content normal.     Assessment/Plan Partial SBO NPO except for medication NGT to low intermittent suction Appreciate surgery input  Pneumonia Per CT scan? tx with rocephin 1gm iv qday, and zithromax 567m iv qday  ? L pleural effusion Consider pulmonary consultation in am please  Dm2 fsbs q4h iss  Hypokalemia Replete, check bmp in am  Anemia Check cbc in am  DVT prophylaxis:  Scd.Jani Gravel4/29/2016, 10:28 PM

## 2014-07-26 ENCOUNTER — Inpatient Hospital Stay (HOSPITAL_COMMUNITY): Payer: Medicare Other

## 2014-07-26 LAB — CBC
HCT: 30.6 % — ABNORMAL LOW (ref 39.0–52.0)
HEMOGLOBIN: 10.6 g/dL — AB (ref 13.0–17.0)
MCH: 30.8 pg (ref 26.0–34.0)
MCHC: 34.6 g/dL (ref 30.0–36.0)
MCV: 89 fL (ref 78.0–100.0)
Platelets: 152 10*3/uL (ref 150–400)
RBC: 3.44 MIL/uL — AB (ref 4.22–5.81)
RDW: 15 % (ref 11.5–15.5)
WBC: 12.3 10*3/uL — ABNORMAL HIGH (ref 4.0–10.5)

## 2014-07-26 LAB — COMPREHENSIVE METABOLIC PANEL
ALBUMIN: 3.7 g/dL (ref 3.5–5.2)
ALK PHOS: 106 U/L (ref 39–117)
ALT: 16 U/L (ref 0–53)
ANION GAP: 15 (ref 5–15)
AST: 26 U/L (ref 0–37)
BUN: 23 mg/dL (ref 6–23)
CO2: 19 mmol/L (ref 19–32)
Calcium: 8.9 mg/dL (ref 8.4–10.5)
Chloride: 96 mmol/L (ref 96–112)
Creatinine, Ser: 1.06 mg/dL (ref 0.50–1.35)
GFR calc Af Amer: 78 mL/min — ABNORMAL LOW (ref >90)
GFR calc non Af Amer: 68 mL/min — ABNORMAL LOW (ref >90)
Glucose, Bld: 242 mg/dL — ABNORMAL HIGH (ref 70–99)
POTASSIUM: 3.9 mmol/L (ref 3.5–5.1)
Sodium: 130 mmol/L — ABNORMAL LOW (ref 135–145)
Total Bilirubin: 1.1 mg/dL (ref 0.3–1.2)
Total Protein: 6.9 g/dL (ref 6.0–8.3)

## 2014-07-26 LAB — GLUCOSE, CAPILLARY
GLUCOSE-CAPILLARY: 162 mg/dL — AB (ref 70–99)
GLUCOSE-CAPILLARY: 217 mg/dL — AB (ref 70–99)
GLUCOSE-CAPILLARY: 232 mg/dL — AB (ref 70–99)
GLUCOSE-CAPILLARY: 246 mg/dL — AB (ref 70–99)
Glucose-Capillary: 221 mg/dL — ABNORMAL HIGH (ref 70–99)
Glucose-Capillary: 212 mg/dL — ABNORMAL HIGH (ref 70–99)

## 2014-07-26 MED ORDER — INSULIN ASPART 100 UNIT/ML ~~LOC~~ SOLN
0.0000 [IU] | SUBCUTANEOUS | Status: DC
  Administered 2014-07-26: 3 [IU] via SUBCUTANEOUS
  Administered 2014-07-26: 2 [IU] via SUBCUTANEOUS
  Administered 2014-07-26: 3 [IU] via SUBCUTANEOUS
  Administered 2014-07-27 (×2): 2 [IU] via SUBCUTANEOUS
  Administered 2014-07-27: 1 [IU] via SUBCUTANEOUS
  Administered 2014-07-27: 2 [IU] via SUBCUTANEOUS
  Administered 2014-07-27 – 2014-07-28 (×3): 1 [IU] via SUBCUTANEOUS
  Administered 2014-07-28: 2 [IU] via SUBCUTANEOUS
  Administered 2014-07-28 (×2): 1 [IU] via SUBCUTANEOUS
  Administered 2014-07-29: 2 [IU] via SUBCUTANEOUS
  Administered 2014-07-29 (×2): 1 [IU] via SUBCUTANEOUS
  Administered 2014-07-29 – 2014-07-30 (×2): 2 [IU] via SUBCUTANEOUS
  Administered 2014-07-30 (×3): 1 [IU] via SUBCUTANEOUS
  Administered 2014-07-31: 3 [IU] via SUBCUTANEOUS
  Administered 2014-07-31: 1 [IU] via SUBCUTANEOUS
  Administered 2014-07-31: 3 [IU] via SUBCUTANEOUS
  Administered 2014-07-31: 2 [IU] via SUBCUTANEOUS
  Administered 2014-07-31: 1 [IU] via SUBCUTANEOUS
  Administered 2014-08-01: 2 [IU] via SUBCUTANEOUS
  Administered 2014-08-01: 3 [IU] via SUBCUTANEOUS
  Administered 2014-08-01 (×2): 2 [IU] via SUBCUTANEOUS
  Administered 2014-08-01: 1 [IU] via SUBCUTANEOUS
  Administered 2014-08-01: 2 [IU] via SUBCUTANEOUS
  Administered 2014-08-02: 1 [IU] via SUBCUTANEOUS
  Administered 2014-08-02: 2 [IU] via SUBCUTANEOUS
  Administered 2014-08-02: 1 [IU] via SUBCUTANEOUS
  Administered 2014-08-03: 2 [IU] via SUBCUTANEOUS
  Administered 2014-08-03: 1 [IU] via SUBCUTANEOUS
  Administered 2014-08-03 (×2): 2 [IU] via SUBCUTANEOUS
  Administered 2014-08-03 – 2014-08-04 (×4): 1 [IU] via SUBCUTANEOUS
  Administered 2014-08-04: 2 [IU] via SUBCUTANEOUS
  Administered 2014-08-04: 1 [IU] via SUBCUTANEOUS
  Administered 2014-08-04: 2 [IU] via SUBCUTANEOUS
  Administered 2014-08-05 (×2): 3 [IU] via SUBCUTANEOUS

## 2014-07-26 MED ORDER — CETYLPYRIDINIUM CHLORIDE 0.05 % MT LIQD
7.0000 mL | Freq: Two times a day (BID) | OROMUCOSAL | Status: DC
  Administered 2014-07-26 – 2014-07-30 (×8): 7 mL via OROMUCOSAL

## 2014-07-26 MED ORDER — POTASSIUM CHLORIDE IN NACL 20-0.9 MEQ/L-% IV SOLN
INTRAVENOUS | Status: AC
  Administered 2014-07-26: 15:00:00 via INTRAVENOUS
  Filled 2014-07-26: qty 1000

## 2014-07-26 NOTE — Consult Note (Signed)
Reason for Consult:PSBO  Referring Physician: Oney Dean is an 74 y.o. male.  HPI: [name] yesterday morning. It persisted throughout the day and he had a small episode of vomiting. It normal bowel movement yesterday morning. As the Dean persisted, he came to emergency department. He had further nausea and vomiting after arriving. Workup in the ED included laboratory studies and CT scan of the abdomen and pelvis. On CT, he was found to have partial small bowel obstruction. He was admitted to the medical service and we are asked to consult regarding his obstruction. He continues to have some crampy abdominal Dean and nausea.  Past Medical History  Diagnosis Date  . CAD (coronary artery disease)   . History of acute inferior wall MI   . Diabetes mellitus   . HTN (hypertension)   . Dyslipidemia   . 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 BPH (benign prostatic hyperplasia)   . 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 Hyponatremia     Past Surgical History  Procedure Laterality Date  . Coronary artery bypass graft  redo 2005    free Rima to OM, svg-diag,svg-pda  . Tonsillectomy    . Rotator cuff repair    . Melanoma excision    . Eye surgery      BILATERAL   . Kyphoplasty  02/29/2012    Procedure: KYPHOPLASTY;  Surgeon: Kyle Ship, MD;  Location: [place];  Service: Orthopedics;  Laterality: Bilateral;  T 10 kyphoplasty    Family History  Problem Relation Age of Onset  . Dementia Mother     Social History:  reports that he quit smoking about 28 years ago. He does not have any smokeless tobacco history on file. He reports that he does not drink alcohol or use illicit drugs.  Allergies:  Allergies  Allergen Reactions  . Hctz [Hydrochlorothiazide] Other (See Comments)    Hyponatremia     Medications:  Scheduled: . amLODipine  5 mg Oral Daily  . aspirin EC  81 mg Oral Daily  . azithromycin  500 mg Intravenous Q24H  .  carvedilol  12.5 mg Oral BID WC  . cefTRIAXone (ROCEPHIN)  IV  1 g Intravenous Q24H  . clopidogrel  75 mg Oral Daily  . insulin aspart  0-9 Units Subcutaneous TID WC  . losartan  100 mg Oral Daily  . simvastatin  20 mg Oral QHS   Continuous: . 0.9 % NaCl with KCl 20 mEq / L 75 mL/hr at 07/26/14 0026   HKV:[id number] **OR** acetaminophen, ALPRAZolam, HYDROmorphone (DILAUDID) injection, nitroGLYCERIN  Results for orders placed or performed during the hospital encounter of 07/25/14 (from the past 48 hour(s))  CBC with Differential     Status: Abnormal   Collection Time: 07/25/14  6:28 PM  Result Value Ref Range   WBC 9.5 4.0 - 10.5 K/uL   RBC 3.52 (L) 4.22 - 5.81 MIL/uL   Hemoglobin 11.0 (L) 13.0 - 17.0 g/dL   HCT 30.5 (L) 39.0 - 52.0 %   MCV 86.6 78.0 - 100.0 fL   MCH 31.3 26.0 - 34.0 pg   MCHC 36.1 (H) 30.0 - 36.0 g/dL   RDW 14.8 11.5 - 15.5 %   Platelets 175 150 - 400 K/uL   Neutrophils Relative % 85 (H) 43 - 77 %   Neutro Abs 8.0 (H) 1.7 - 7.7 K/uL   Lymphocytes Relative 7 (L) 12 - 46 %  Lymphs Abs 0.7 0.7 - 4.0 K/uL   Monocytes Relative 8 3 - 12 %   Monocytes Absolute 0.8 0.1 - 1.0 K/uL   Eosinophils Relative 0 0 - 5 %   Eosinophils Absolute 0.0 0.0 - 0.7 K/uL   Basophils Relative 0 0 - 1 %   Basophils Absolute 0.0 0.0 - 0.1 K/uL  Comprehensive metabolic panel     Status: Abnormal   Collection Time: 07/25/14  6:28 PM  Result Value Ref Range   Sodium 132 (L) 135 - 145 mmol/L   Potassium 3.3 (L) 3.5 - 5.1 mmol/L   Chloride 97 96 - 112 mmol/L   CO2 22 19 - 32 mmol/L   Glucose, Bld 89 70 - 99 mg/dL   BUN 18 6 - 23 mg/dL   Creatinine, Ser 1.06 0.50 - 1.35 mg/dL   Calcium 9.7 8.4 - 10.5 mg/dL   Total Protein 7.5 6.0 - 8.3 g/dL   Albumin 4.1 3.5 - 5.2 g/dL   AST 27 0 - 37 U/L   ALT 18 0 - 53 U/L   Alkaline Phosphatase 109 39 - 117 U/L   Total Bilirubin 1.6 (H) 0.3 - 1.2 mg/dL   GFR calc non Af Amer 68 (L) >90 mL/min   GFR calc Af Amer 78 (L) >90 mL/min    Comment:  (NOTE) The eGFR has been calculated using the CKD EPI equation. This calculation has not been validated in all clinical situations. eGFR's persistently <90 mL/min signify possible Chronic Kidney Disease.    Anion gap 13 5 - 15  Lipase, blood     Status: None   Collection Time: 07/25/14  6:28 PM  Result Value Ref Range   Lipase 31 11 - 59 U/L  I-stat troponin, ED (only if pt is 74 y.o. or older & Dean is above umbilicus) - do not order at [facility]     Status: None   Collection Time: 07/25/14  7:07 PM  Result Value Ref Range   Troponin i, poc 0.01 0.00 - 0.08 ng/mL   Comment 3            Comment: Due to the release kinetics of cTnI, a negative result within the first hours of the onset of symptoms does not rule out myocardial infarction with certainty. If myocardial infarction is still suspected, repeat the test at appropriate intervals.   Urinalysis with microscopic     Status: Abnormal   Collection Time: 07/25/14  9:07 PM  Result Value Ref Range   Color, Urine YELLOW YELLOW   APPearance CLEAR CLEAR   Specific Gravity, Urine 1.025 1.005 - 1.030   pH 8.0 5.0 - 8.0   Glucose, UA NEGATIVE NEGATIVE mg/dL   Hgb urine dipstick NEGATIVE NEGATIVE   Bilirubin Urine NEGATIVE NEGATIVE   Ketones, ur >80 (A) NEGATIVE mg/dL   Protein, ur 100 (A) NEGATIVE mg/dL   Urobilinogen, UA 1.0 0.0 - 1.0 mg/dL   Nitrite NEGATIVE NEGATIVE   Leukocytes, UA NEGATIVE NEGATIVE  Urine microscopic-add on     Status: None   Collection Time: 07/25/14  9:07 PM  Result Value Ref Range   Squamous Epithelial / LPF RARE RARE   WBC, UA 0-2 <3 WBC/hpf   RBC / HPF 0-2 <3 RBC/hpf   Bacteria, UA RARE RARE  Glucose, capillary     Status: Abnormal   Collection Time: 07/25/14 11:44 PM  Result Value Ref Range   Glucose-Capillary 155 (H) 70 - 99 mg/dL   Comment 1 Notify  RN     Ct Abdomen Pelvis W Contrast  07/25/2014   CLINICAL DATA:  Mid to lower abdominal Dean since 12 hours ago. Vomiting.  EXAM: CT ABDOMEN AND  PELVIS WITH CONTRAST  TECHNIQUE: Multidetector CT imaging of the abdomen and pelvis was performed using the standard protocol following bolus administration of intravenous contrast.  CONTRAST:  140m OMNIPAQUE IOHEXOL 300 MG/ML  SOLN  COMPARISON:  None.  FINDINGS: There is a left effusion layering dependently with patchy density in the dependent left lung that could be atelectasis or pneumonia.  The liver shows a pattern raising the possibility of early cirrhosis. There are 2 benign calcifications in the left lobe. There are calcified stones in the gallbladder neck. No stone is seen along the course of the common duct. The spleen is normal. There are bilateral low-density adrenal adenomas. The kidneys are normal. The aorta and its branch vessels show extensive atherosclerotic disease. No aneurysm. Bladder, prostate gland and seminal vesicles are unremarkable.  There are dilated fluid and air-filled loops of small intestine consistent with partial small bowel obstruction. There is a caliber change at the mid ileum. Specific obstructing lesion is not identified. There is free intraperitoneal fluid but no free intraperitoneal air are identified. Old vertebral augmentation at T10. Chronic bilateral pars defects at L5.  IMPRESSION: Partial small bowel obstruction. Level of the obstruction is probably mid ileum. Specific etiology not demonstrated.  Free fluid which could relate to the small bowel obstruction or could be secondary to cirrhosis. The patient does appear to have cirrhosis of the liver.  Gallstones.  Advanced widespread atherosclerosis.  Left pleural effusion. Atelectasis and/or pneumonia in the dependent left lower lobe.   Electronically Signed   By: MNelson ChimesM.D.   On: 07/25/2014 21:28   Dg Abd Portable 1v  07/25/2014   CLINICAL DATA:  Abdominal Dean.  Nausea and vomiting for 1 day.  EXAM: PORTABLE ABDOMEN - 1 VIEW  COMPARISON:  CT of earlier in the day  FINDINGS: Single supine portable view. Contrast  within the urinary bladder. Gastric distension with possible nasogastric tube, incompletely imaged. Proximal small bowel loops measure up to 3.2 cm. Gas and stool within the colon. Advanced atherosclerosis.  IMPRESSION: Partial small bowel obstruction, without free intraperitoneal air or other acute complication.   Electronically Signed   By: KAbigail MiyamotoM.D.   On: 07/25/2014 23:46    Review of Systems  Constitutional: Negative for fever and chills.  HENT:       NGT  Eyes: Negative.   Respiratory: Negative.   Cardiovascular: Negative for chest Dean and palpitations.  Gastrointestinal: Positive for nausea, vomiting and abdominal Dean.  Genitourinary: Negative.   Musculoskeletal: Negative.   Skin: Negative.   Neurological: Negative.   Endo/Heme/Allergies: Negative.   Psychiatric/Behavioral: Negative.    Blood pressure 143/66, pulse 77, temperature 98.2 F (36.8 C), temperature source Oral, resp. rate 18, height 5' 9.5" (1.765 m), weight 79.334 kg (174 lb 14.4 oz), SpO2 94 %. Physical Exam  Constitutional: He is oriented to person, place, and time. He appears well-developed and well-nourished. No distress.  HENT:  Head: Normocephalic and atraumatic.  Right Ear: External ear normal.  Left Ear: External ear normal.  Mouth/Throat: Oropharynx is clear and moist.  NGT  Eyes: EOM are normal. Pupils are equal, round, and reactive to light. Right eye exhibits no discharge. Left eye exhibits no discharge. No scleral icterus.  Neck: Normal range of motion. Neck supple. No tracheal deviation present.  Cardiovascular:  Normal rate, regular rhythm, normal heart sounds and intact distal pulses.   Respiratory: Effort normal and breath sounds normal. No stridor. No respiratory distress. He has no wheezes. He has no rales.  GI: Soft. He exhibits distension. He exhibits no mass. There is tenderness. There is no rebound and no guarding.  Mild distention, mild tenderness left lower quadrant without  guarding, no generalized tenderness, no peritonitis  Musculoskeletal: He exhibits no edema or tenderness.  Neurological: He is alert and oriented to person, place, and time. He exhibits normal muscle tone.  Skin: Skin is warm and dry.  Psychiatric: He has a normal mood and affect.    Assessment/Plan: PSBO from uncertain etiology - agree with medical admission, IV fluids, and NG tube to low intermittent suction. We will follow closely. If he does not open up, he may require surgery. The plan was discussed in detail with him and his wife.  Sac[place] Monds E 07/26/2014, 3:08 AM

## 2014-07-26 NOTE — Progress Notes (Signed)
[facility] PROGRESS NOTE  UNDREA SHIPES HYQ:[id number] DOB: [birth year] DOA: 07/25/2014 PCP:  Melinda Crutch, MD  Assessment/Plan: 1. Sbo: NPO, NG tube in place, plan to recheck abd film in am.  Comfortable.   Diabetes mellitus: CBG (last 3)   Recent Labs  07/26/14 0719 07/26/14 1205 07/26/14 1719  GLUCAP 246* 212* 217*    On SSI.  hgba1c ordered and pending.    Hypokalemia: Replete as needed.   Hypertension:  well controlled  Anemia: Normocytic. Anemia panel ordered.       Questionable left pleural effusion on CT abdomen>  Would get a 2 view CXR in am.  He is currently asymptomatic, denies any cough or sob.  He is afebrile and there mild leukocytosis., clinically he doesn't appear to have pneumonia.     Code Status: full code.  Family Communication: none at bedside Disposition Plan: pending further investigation.   Consultants:  Surgery.   Procedures:  none  Antibiotics:  none  HPI/Subjective: Not passing flatulence, one BM yesterday and none today.   Objective: Filed Vitals:   07/26/14 1400  BP: 123/48  Pulse: 62  Temp: 98 F (36.7 C)  Resp: 18    Intake/Output Summary (Last 24 hours) at 07/26/14 1819 Last data filed at 07/26/14 1521  Gross per 24 hour  Intake   1281 ml  Output   1850 ml  Net   -569 ml   Filed Weights   07/25/14 1756 07/25/14 2352 07/26/14 0459  Weight: 78.926 kg (174 lb) 79.334 kg (174 lb 14.4 oz) 82.6 kg (182 lb 1.6 oz)    Exam:   General:  Alert and sitting in the chair with NG TUBE ON SUCTION  Cardiovascular: s1s2,  Respiratory: clear to auscultation, no wheezing or rhonchi  Abdomen: soft non tender mild distended.   Musculoskeletal: no pedal edema.   Data Reviewed: Basic Metabolic Panel:  Recent Labs Lab 07/25/14 1828 07/26/14 0411  NA 132* 130*  K 3.3* 3.9  CL 97 96  CO2 22 19  GLUCOSE 89 242*  BUN 18 23  CREATININE 1.06 1.06  CALCIUM 9.7 8.9   Liver Function Tests:  Recent  Labs Lab 07/25/14 1828 07/26/14 0411  AST 27 26  ALT 18 16  ALKPHOS 109 106  BILITOT 1.6* 1.1  PROT 7.5 6.9  ALBUMIN 4.1 3.7    Recent Labs Lab 07/25/14 1828  LIPASE 31   No results for input(s): AMMONIA in the last 168 hours. CBC:  Recent Labs Lab 07/25/14 1828 07/26/14 0411  WBC 9.5 12.3*  NEUTROABS 8.0*  --   HGB 11.0* 10.6*  HCT 30.5* 30.6*  MCV 86.6 89.0  PLT 175 152   Cardiac Enzymes: No results for input(s): CKTOTAL, CKMB, CKMBINDEX, TROPONINI in the last 168 hours. BNP (last 3 results) No results for input(s): BNP in the last 8760 hours.  ProBNP (last 3 results) No results for input(s): PROBNP in the last 8760 hours.  CBG:  Recent Labs Lab 07/25/14 2344 07/26/14 0336 07/26/14 0719 07/26/14 1205 07/26/14 1719  GLUCAP 155* 221* 246* 212* 217*    No results found for this or any previous visit (from the past 240 hour(s)).   Studies: Ct Abdomen Pelvis W Contrast  07/25/2014   CLINICAL DATA:  Mid to lower abdominal pain since 12 hours ago. Vomiting.  EXAM: CT ABDOMEN AND PELVIS WITH CONTRAST  TECHNIQUE: Multidetector CT imaging of the abdomen and pelvis was performed using the standard protocol following bolus administration of intravenous  contrast.  CONTRAST:  151mL OMNIPAQUE IOHEXOL 300 MG/ML  SOLN  COMPARISON:  None.  FINDINGS: There is a left effusion layering dependently with patchy density in the dependent left lung that could be atelectasis or pneumonia.  The liver shows a pattern raising the possibility of early cirrhosis. There are 2 benign calcifications in the left lobe. There are calcified stones in the gallbladder neck. No stone is seen along the course of the common duct. The spleen is normal. There are bilateral low-density adrenal adenomas. The kidneys are normal. The aorta and its branch vessels show extensive atherosclerotic disease. No aneurysm. Bladder, prostate gland and seminal vesicles are unremarkable.  There are dilated fluid and  air-filled loops of small intestine consistent with partial small bowel obstruction. There is a caliber change at the mid ileum. Specific obstructing lesion is not identified. There is free intraperitoneal fluid but no free intraperitoneal air are identified. Old vertebral augmentation at T10. Chronic bilateral pars defects at L5.  IMPRESSION: Partial small bowel obstruction. Level of the obstruction is probably mid ileum. Specific etiology not demonstrated.  Free fluid which could relate to the small bowel obstruction or could be secondary to cirrhosis. The patient does appear to have cirrhosis of the liver.  Gallstones.  Advanced widespread atherosclerosis.  Left pleural effusion. Atelectasis and/or pneumonia in the dependent left lower lobe.   Electronically Signed   By: Nelson Chimes M.D.   On: 07/25/2014 21:28   Dg Abd Portable 1v  07/25/2014   CLINICAL DATA:  Abdominal pain.  Nausea and vomiting for 1 day.  EXAM: PORTABLE ABDOMEN - 1 VIEW  COMPARISON:  CT of earlier in the day  FINDINGS: Single supine portable view. Contrast within the urinary bladder. Gastric distension with possible nasogastric tube, incompletely imaged. Proximal small bowel loops measure up to 3.2 cm. Gas and stool within the colon. Advanced atherosclerosis.  IMPRESSION: Partial small bowel obstruction, without free intraperitoneal air or other acute complication.   Electronically Signed   By: Abigail Miyamoto M.D.   On: 07/25/2014 23:46    Scheduled Meds: . amLODipine  5 mg Oral Daily  . antiseptic oral rinse  7 mL Mouth Rinse BID  . aspirin EC  81 mg Oral Daily  . azithromycin  500 mg Intravenous Q24H  . carvedilol  12.5 mg Oral BID WC  . cefTRIAXone (ROCEPHIN)  IV  1 g Intravenous Q24H  . clopidogrel  75 mg Oral Daily  . insulin aspart  0-9 Units Subcutaneous Q4H  . losartan  100 mg Oral Daily  . simvastatin  20 mg Oral QHS   Continuous Infusions: . 0.9 % NaCl with KCl 20 mEq / L 75 mL/hr at 07/26/14 1521    Active  Problems:   SBO (small bowel obstruction)   Partial small bowel obstruction    Time spent: 25 minuttes    Oletta Buehring  [facility] Pager [phone] If 7PM-7AM, please contact night-coverage at [email or web address], password [facility] 07/26/2014, 6:19 PM  LOS: 1 day

## 2014-07-26 NOTE — Progress Notes (Signed)
NGtube is coiled back in the lower esophagus per radiologist. Result relayed to M. Donnal Debar, NP w/ a new order to replace NGtube. Will carry out.

## 2014-07-26 NOTE — Progress Notes (Signed)
Patient ID: [name], male   DOB: 03-17-1941, 74 y.o.   MRN: [id number]   LOS: 1 day   Subjective: Nausea is improved, abd pain was better now it seems to be coming back.   Objective: Vital signs in last 24 hours: Temp:  [98.2 F (36.8 C)-98.4 F (36.9 C)] 98.4 F (36.9 C) (04/30 0459) Pulse Rate:  [68-78] 75 (04/30 0459) Resp:  [15-21] 17 (04/30 0459) BP: (125-173)/(49-80) 125/55 mmHg (04/30 0459) SpO2:  [92 %-100 %] 95 % (04/30 0459) Weight:  [78.926 kg (174 lb)-82.6 kg (182 lb 1.6 oz)] 82.6 kg (182 lb 1.6 oz) (04/30 0459) Last BM Date: 07/25/14   NGT: 866ml/insertion   Laboratory  CBC  Recent Labs  07/25/14 1828 07/26/14 0411  WBC 9.5 12.3*  HGB 11.0* 10.6*  HCT 30.5* 30.6*  PLT 175 152   BMET  Recent Labs  07/25/14 1828 07/26/14 0411  NA 132* 130*  K 3.3* 3.9  CL 97 96  CO2 22 19  GLUCOSE 89 242*  BUN 18 23  CREATININE 1.06 1.06  CALCIUM 9.7 8.9   CBG (last 3)   Recent Labs  07/25/14 2344 07/26/14 0336 07/26/14 0719  GLUCAP 155* 221* 246*    Physical Exam General appearance: alert and no distress Resp: clear to auscultation bilaterally Cardio: regular rate and rhythm GI: Soft, +BS   Assessment/Plan: SBO -- Recheck abd films tomorrow. Continue NGT.    Lisette Abu, PA-C Pager: [phone] 07/26/2014

## 2014-07-27 ENCOUNTER — Inpatient Hospital Stay (HOSPITAL_COMMUNITY): Payer: Medicare Other

## 2014-07-27 LAB — CBC
HCT: 25.5 % — ABNORMAL LOW (ref 39.0–52.0)
Hemoglobin: 8.9 g/dL — ABNORMAL LOW (ref 13.0–17.0)
MCH: 30.8 pg (ref 26.0–34.0)
MCHC: 34.9 g/dL (ref 30.0–36.0)
MCV: 88.2 fL (ref 78.0–100.0)
Platelets: 152 10*3/uL (ref 150–400)
RBC: 2.89 MIL/uL — AB (ref 4.22–5.81)
RDW: 15.1 % (ref 11.5–15.5)
WBC: 9.3 10*3/uL (ref 4.0–10.5)

## 2014-07-27 LAB — GLUCOSE, CAPILLARY
GLUCOSE-CAPILLARY: 155 mg/dL — AB (ref 70–99)
GLUCOSE-CAPILLARY: 176 mg/dL — AB (ref 70–99)
Glucose-Capillary: 161 mg/dL — ABNORMAL HIGH (ref 70–99)
Glucose-Capillary: 134 mg/dL — ABNORMAL HIGH (ref 70–99)
Glucose-Capillary: 127 mg/dL — ABNORMAL HIGH (ref 70–99)
Glucose-Capillary: 102 mg/dL — ABNORMAL HIGH (ref 70–99)

## 2014-07-27 LAB — RETICULOCYTES
RBC.: 2.89 MIL/uL — ABNORMAL LOW (ref 4.22–5.81)
RETIC COUNT ABSOLUTE: 72.3 10*3/uL (ref 19.0–186.0)
Retic Ct Pct: 2.5 % (ref 0.4–3.1)

## 2014-07-27 LAB — BASIC METABOLIC PANEL
Anion gap: 10 (ref 5–15)
BUN: 33 mg/dL — AB (ref 6–20)
CALCIUM: 8.9 mg/dL (ref 8.9–10.3)
CO2: 22 mmol/L (ref 22–32)
Chloride: 100 mmol/L — ABNORMAL LOW (ref 101–111)
Creatinine, Ser: 1.18 mg/dL (ref 0.61–1.24)
GFR calc Af Amer: >60 mL/min (ref >60)
GFR, EST NON AFRICAN AMERICAN: 59 mL/min — AB (ref >60)
GLUCOSE: 166 mg/dL — AB (ref 70–99)
Potassium: 3.9 mmol/L (ref 3.5–5.1)
Sodium: 132 mmol/L — ABNORMAL LOW (ref 135–145)

## 2014-07-27 LAB — IRON AND TIBC
IRON: 39 ug/dL — AB (ref 45–182)
Saturation Ratios: 13 % — ABNORMAL LOW (ref 17.9–39.5)
TIBC: 302 ug/dL (ref 250–450)
UIBC: 263 ug/dL

## 2014-07-27 LAB — FERRITIN: Ferritin: 226 ng/mL (ref 24–336)

## 2014-07-27 LAB — FOLATE: Folate: 14.2 ng/mL (ref >5.9)

## 2014-07-27 LAB — VITAMIN B12: VITAMIN B 12: 1149 pg/mL — AB (ref 180–914)

## 2014-07-27 MED ORDER — POTASSIUM CHLORIDE IN NACL 20-0.9 MEQ/L-% IV SOLN
INTRAVENOUS | Status: AC
  Administered 2014-07-27: 06:00:00 via INTRAVENOUS
  Filled 2014-07-27: qty 1000

## 2014-07-27 MED ORDER — SODIUM CHLORIDE 0.9 % IV SOLN
INTRAVENOUS | Status: DC
  Administered 2014-07-27 – 2014-08-02 (×9): via INTRAVENOUS

## 2014-07-27 NOTE — Progress Notes (Signed)
[facility] PROGRESS NOTE  TYLIEK TIMBERMAN VPX:[id number] DOB: 20-Jun-1940 DOA: 07/25/2014 PCP:  Melinda Crutch, MD  Assessment/Plan: 1. Sbo: NPO, NG tube in placed  on intermittent suction and his abdominal distention improved. He had one BM and is able to pass flatulence. His repeat abd film shows persistent sbo, will keep the NG tube and continue twith NPO status. Reassess in am.  Recommend ambulation in the hallway.  Comfortable.   Diabetes mellitus: CBG (last 3)   Recent Labs  07/27/14 0400 07/27/14 0731 07/27/14 1146  GLUCAP 176* 161* 134*    On SSI.  hgba1c ordered and pending.    Hypokalemia: Replete as needed.   Hypertension:  well controlled  Anemia: Normocytic. Anemia panel ordered.       Questionable left pleural effusion on CT abdomen> and CAP on CXR.  He is currently asymptomatic, denies any cough or sob.  He is afebrile and there mild leukocytosis., clinically he doesn't appear to have pneumonia. But CXR showed patchy infiltrates over the upper lobe and left lower lobe and he was empirically started on antibiotics. Will need a repeat CXR in 4 to 6 weeks to evaluate for resolution of the pneumonia.     Code Status: full code.  Family Communication: none at bedside Disposition Plan: pending further investigation.   Consultants:  Surgery.   Procedures:  none  Antibiotics:  none  HPI/Subjective: Passing flatulence and one bm today. Ambulating in the hallway.    Objective: Filed Vitals:   07/27/14 0622  BP: 135/45  Pulse: 68  Temp: 98.1 F (36.7 C)  Resp: 18    Intake/Output Summary (Last 24 hours) at 07/27/14 1321 Last data filed at 07/27/14 0521  Gross per 24 hour  Intake   1650 ml  Output    950 ml  Net    700 ml   Filed Weights   07/25/14 2352 07/26/14 0459 07/27/14 0500  Weight: 79.334 kg (174 lb 14.4 oz) 82.6 kg (182 lb 1.6 oz) 83.099 kg (183 lb 3.2 oz)    Exam:   General:  Alert and sitting in the chair with NG  TUBE ON SUCTION  Cardiovascular: s1s2,  Respiratory: clear to auscultation, no wheezing or rhonchi  Abdomen: soft non tender mild distended.   Musculoskeletal: no pedal edema.   Data Reviewed: Basic Metabolic Panel:  Recent Labs Lab 07/25/14 1828 07/26/14 0411 07/27/14 0550  NA 132* 130* 132*  K 3.3* 3.9 3.9  CL 97 96 100*  CO2 22 19 22   GLUCOSE 89 242* 166*  BUN 18 23 33*  CREATININE 1.06 1.06 1.18  CALCIUM 9.7 8.9 8.9   Liver Function Tests:  Recent Labs Lab 07/25/14 1828 07/26/14 0411  AST 27 26  ALT 18 16  ALKPHOS 109 106  BILITOT 1.6* 1.1  PROT 7.5 6.9  ALBUMIN 4.1 3.7    Recent Labs Lab 07/25/14 1828  LIPASE 31   No results for input(s): AMMONIA in the last 168 hours. CBC:  Recent Labs Lab 07/25/14 1828 07/26/14 0411 07/27/14 0550  WBC 9.5 12.3* 9.3  NEUTROABS 8.0*  --   --   HGB 11.0* 10.6* 8.9*  HCT 30.5* 30.6* 25.5*  MCV 86.6 89.0 88.2  PLT 175 152 152   Cardiac Enzymes: No results for input(s): CKTOTAL, CKMB, CKMBINDEX, TROPONINI in the last 168 hours. BNP (last 3 results) No results for input(s): BNP in the last 8760 hours.  ProBNP (last 3 results) No results for input(s): PROBNP in the last  8760 hours.  CBG:  Recent Labs Lab 07/26/14 1937 07/26/14 2356 07/27/14 0400 07/27/14 0731 07/27/14 1146  GLUCAP 162* 232* 176* 161* 134*    No results found for this or any previous visit (from the past 240 hour(s)).   Studies: Dg Chest 2 View  07/26/2014   CLINICAL DATA:  Pleural effusion.  EXAM: CHEST  2 VIEW  COMPARISON:  02/17/2012  FINDINGS: Patient's nasogastric tube in place, tip coiled back upon itself into the lower esophagus. Status post median sternotomy and CABG. Heart is enlarged. There is mild interstitial edema. There patchy infiltrates within the left upper lobe and left lower lobe. Left pleural effusion. Persistent wedge compression of T10, unchanged. Mild dilatation of small bowel loops, unchanged.  IMPRESSION: 1.  Cardiomegaly and mild edema. 2. Left upper lobe and left lower lobe infiltrates associated with left pleural effusion. 3. Nasogastric tube coiled back upon itself into lower esophagus. 4. The salient findings were discussed with Lordis on 07/26/2014 at 8:04 pm.   Electronically Signed   By: Nolon Nations M.D.   On: 07/26/2014 20:04   Ct Abdomen Pelvis W Contrast  07/25/2014   CLINICAL DATA:  Mid to lower abdominal pain since 12 hours ago. Vomiting.  EXAM: CT ABDOMEN AND PELVIS WITH CONTRAST  TECHNIQUE: Multidetector CT imaging of the abdomen and pelvis was performed using the standard protocol following bolus administration of intravenous contrast.  CONTRAST:  137mL OMNIPAQUE IOHEXOL 300 MG/ML  SOLN  COMPARISON:  None.  FINDINGS: There is a left effusion layering dependently with patchy density in the dependent left lung that could be atelectasis or pneumonia.  The liver shows a pattern raising the possibility of early cirrhosis. There are 2 benign calcifications in the left lobe. There are calcified stones in the gallbladder neck. No stone is seen along the course of the common duct. The spleen is normal. There are bilateral low-density adrenal adenomas. The kidneys are normal. The aorta and its branch vessels show extensive atherosclerotic disease. No aneurysm. Bladder, prostate gland and seminal vesicles are unremarkable.  There are dilated fluid and air-filled loops of small intestine consistent with partial small bowel obstruction. There is a caliber change at the mid ileum. Specific obstructing lesion is not identified. There is free intraperitoneal fluid but no free intraperitoneal air are identified. Old vertebral augmentation at T10. Chronic bilateral pars defects at L5.  IMPRESSION: Partial small bowel obstruction. Level of the obstruction is probably mid ileum. Specific etiology not demonstrated.  Free fluid which could relate to the small bowel obstruction or could be secondary to cirrhosis. The  patient does appear to have cirrhosis of the liver.  Gallstones.  Advanced widespread atherosclerosis.  Left pleural effusion. Atelectasis and/or pneumonia in the dependent left lower lobe.   Electronically Signed   By: Nelson Chimes M.D.   On: 07/25/2014 21:28   Dg Abd Portable 1v  07/27/2014   CLINICAL DATA:  Small-bowel obstruction.  EXAM: PORTABLE ABDOMEN - 1 VIEW  COMPARISON:  07/25/2014  FINDINGS: Nasogastric tube is partially imaged, tip overlying the level of the stomach. There are persistent dilated small bowel loops in the central abdomen. Nondilated loops of large bowel persist. There is a small amount of residual contrast in the bladder.  IMPRESSION: 1. Persistent small bowel dilatation. 2. Residual contrast in the bladder.   Electronically Signed   By: Nolon Nations M.D.   On: 07/27/2014 09:10   Dg Abd Portable 1v  07/25/2014   CLINICAL DATA:  Abdominal pain.  Nausea and vomiting for 1 day.  EXAM: PORTABLE ABDOMEN - 1 VIEW  COMPARISON:  CT of earlier in the day  FINDINGS: Single supine portable view. Contrast within the urinary bladder. Gastric distension with possible nasogastric tube, incompletely imaged. Proximal small bowel loops measure up to 3.2 cm. Gas and stool within the colon. Advanced atherosclerosis.  IMPRESSION: Partial small bowel obstruction, without free intraperitoneal air or other acute complication.   Electronically Signed   By: Abigail Miyamoto M.D.   On: 07/25/2014 23:46    Scheduled Meds: . amLODipine  5 mg Oral Daily  . antiseptic oral rinse  7 mL Mouth Rinse BID  . aspirin EC  81 mg Oral Daily  . azithromycin  500 mg Intravenous Q24H  . carvedilol  12.5 mg Oral BID WC  . cefTRIAXone (ROCEPHIN)  IV  1 g Intravenous Q24H  . clopidogrel  75 mg Oral Daily  . insulin aspart  0-9 Units Subcutaneous Q4H  . losartan  100 mg Oral Daily  . simvastatin  20 mg Oral QHS   Continuous Infusions: . 0.9 % NaCl with KCl 20 mEq / L 75 mL/hr at 07/27/14 9147    Active  Problems:   SBO (small bowel obstruction)   Partial small bowel obstruction    Time spent: 25 minuttes    Gregor Dershem  [facility] Pager [phone] If 7PM-7AM, please contact night-coverage at [email or web address], password [facility] 07/27/2014, 1:21 PM  LOS: 2 days

## 2014-07-27 NOTE — Progress Notes (Signed)
Patient ID: [name], male   DOB: 10-02-1940, 74 y.o.   MRN: [id number] [facility] Surgery Progress Note:   * No surgery found *  Subjective: Mental status is very clear and alert.  He and his wife think he is better today Objective: Vital signs in last 24 hours: Temp:  [97.9 F (36.6 C)-98.1 F (36.7 C)] 98.1 F (36.7 C) (05/01 0622) Pulse Rate:  [59-68] 68 (05/01 0622) Resp:  [17-18] 18 (05/01 0622) BP: (123-135)/(45-53) 135/45 mmHg (05/01 0622) SpO2:  [95 %-98 %] 96 % (05/01 0622) Weight:  [83.099 kg (183 lb 3.2 oz)] 83.099 kg (183 lb 3.2 oz) (05/01 0500)  Intake/Output from previous day: 04/30 0701 - 05/01 0700 In: 3419 [I.V.:1650] Out: 1350 [Urine:850; Emesis/NG output:500] Intake/Output this shift:    Physical Exam: Work of breathing is normal.  Abdomen is soft;  BS present but no flatus yet.  Has had no prior abdominal surgery  Lab Results:  Results for orders placed or performed during the hospital encounter of 07/25/14 (from the past 48 hour(s))  CBC with Differential     Status: Abnormal   Collection Time: 07/25/14  6:28 PM  Result Value Ref Range   WBC 9.5 4.0 - 10.5 K/uL   RBC 3.52 (L) 4.22 - 5.81 MIL/uL   Hemoglobin 11.0 (L) 13.0 - 17.0 g/dL   HCT 30.5 (L) 39.0 - 52.0 %   MCV 86.6 78.0 - 100.0 fL   MCH 31.3 26.0 - 34.0 pg   MCHC 36.1 (H) 30.0 - 36.0 g/dL   RDW 14.8 11.5 - 15.5 %   Platelets 175 150 - 400 K/uL   Neutrophils Relative % 85 (H) 43 - 77 %   Neutro Abs 8.0 (H) 1.7 - 7.7 K/uL   Lymphocytes Relative 7 (L) 12 - 46 %   Lymphs Abs 0.7 0.7 - 4.0 K/uL   Monocytes Relative 8 3 - 12 %   Monocytes Absolute 0.8 0.1 - 1.0 K/uL   Eosinophils Relative 0 0 - 5 %   Eosinophils Absolute 0.0 0.0 - 0.7 K/uL   Basophils Relative 0 0 - 1 %   Basophils Absolute 0.0 0.0 - 0.1 K/uL  Comprehensive metabolic panel     Status: Abnormal   Collection Time: 07/25/14  6:28 PM  Result Value Ref Range   Sodium 132 (L) 135 - 145 mmol/L   Potassium 3.3 (L) 3.5 -  5.1 mmol/L   Chloride 97 96 - 112 mmol/L   CO2 22 19 - 32 mmol/L   Glucose, Bld 89 70 - 99 mg/dL   BUN 18 6 - 23 mg/dL   Creatinine, Ser 1.06 0.50 - 1.35 mg/dL   Calcium 9.7 8.4 - 10.5 mg/dL   Total Protein 7.5 6.0 - 8.3 g/dL   Albumin 4.1 3.5 - 5.2 g/dL   AST 27 0 - 37 U/L   ALT 18 0 - 53 U/L   Alkaline Phosphatase 109 39 - 117 U/L   Total Bilirubin 1.6 (H) 0.3 - 1.2 mg/dL   GFR calc non Af Amer 68 (L) >90 mL/min   GFR calc Af Amer 78 (L) >90 mL/min    Comment: (NOTE) The eGFR has been calculated using the CKD EPI equation. This calculation has not been validated in all clinical situations. eGFR's persistently <90 mL/min signify possible Chronic Kidney Disease.    Anion gap 13 5 - 15  Lipase, blood     Status: None   Collection Time: 07/25/14  6:28  PM  Result Value Ref Range   Lipase 31 11 - 59 U/L  I-stat troponin, ED (only if pt is 74 y.o. or older & pain is above umbilicus) - do not order at [facility]     Status: None   Collection Time: 07/25/14  7:07 PM  Result Value Ref Range   Troponin i, poc 0.01 0.00 - 0.08 ng/mL   Comment 3            Comment: Due to the release kinetics of cTnI, a negative result within the first hours of the onset of symptoms does not rule out myocardial infarction with certainty. If myocardial infarction is still suspected, repeat the test at appropriate intervals.   Urinalysis with microscopic     Status: Abnormal   Collection Time: 07/25/14  9:07 PM  Result Value Ref Range   Color, Urine YELLOW YELLOW   APPearance CLEAR CLEAR   Specific Gravity, Urine 1.025 1.005 - 1.030   pH 8.0 5.0 - 8.0   Glucose, UA NEGATIVE NEGATIVE mg/dL   Hgb urine dipstick NEGATIVE NEGATIVE   Bilirubin Urine NEGATIVE NEGATIVE   Ketones, ur >80 (A) NEGATIVE mg/dL   Protein, ur 100 (A) NEGATIVE mg/dL   Urobilinogen, UA 1.0 0.0 - 1.0 mg/dL   Nitrite NEGATIVE NEGATIVE   Leukocytes, UA NEGATIVE NEGATIVE  Urine microscopic-add on     Status: None   Collection Time:  07/25/14  9:07 PM  Result Value Ref Range   Squamous Epithelial / LPF RARE RARE   WBC, UA 0-2 <3 WBC/hpf   RBC / HPF 0-2 <3 RBC/hpf   Bacteria, UA RARE RARE  Glucose, capillary     Status: Abnormal   Collection Time: 07/25/14 11:44 PM  Result Value Ref Range   Glucose-Capillary 155 (H) 70 - 99 mg/dL   Comment 1 Notify RN   Glucose, capillary     Status: Abnormal   Collection Time: 07/26/14  3:36 AM  Result Value Ref Range   Glucose-Capillary 221 (H) 70 - 99 mg/dL   Comment 1 Notify RN   CBC     Status: Abnormal   Collection Time: 07/26/14  4:11 AM  Result Value Ref Range   WBC 12.3 (H) 4.0 - 10.5 K/uL   RBC 3.44 (L) 4.22 - 5.81 MIL/uL   Hemoglobin 10.6 (L) 13.0 - 17.0 g/dL   HCT 30.6 (L) 39.0 - 52.0 %   MCV 89.0 78.0 - 100.0 fL   MCH 30.8 26.0 - 34.0 pg   MCHC 34.6 30.0 - 36.0 g/dL   RDW 15.0 11.5 - 15.5 %   Platelets 152 150 - 400 K/uL  Comprehensive metabolic panel     Status: Abnormal   Collection Time: 07/26/14  4:11 AM  Result Value Ref Range   Sodium 130 (L) 135 - 145 mmol/L   Potassium 3.9 3.5 - 5.1 mmol/L   Chloride 96 96 - 112 mmol/L   CO2 19 19 - 32 mmol/L   Glucose, Bld 242 (H) 70 - 99 mg/dL   BUN 23 6 - 23 mg/dL   Creatinine, Ser 1.06 0.50 - 1.35 mg/dL   Calcium 8.9 8.4 - 10.5 mg/dL   Total Protein 6.9 6.0 - 8.3 g/dL   Albumin 3.7 3.5 - 5.2 g/dL   AST 26 0 - 37 U/L   ALT 16 0 - 53 U/L   Alkaline Phosphatase 106 39 - 117 U/L   Total Bilirubin 1.1 0.3 - 1.2 mg/dL   GFR calc non  Af Amer 68 (L) >90 mL/min   GFR calc Af Amer 78 (L) >90 mL/min    Comment: (NOTE) The eGFR has been calculated using the CKD EPI equation. This calculation has not been validated in all clinical situations. eGFR's persistently <90 mL/min signify possible Chronic Kidney Disease.    Anion gap 15 5 - 15  Glucose, capillary     Status: Abnormal   Collection Time: 07/26/14  7:19 AM  Result Value Ref Range   Glucose-Capillary 246 (H) 70 - 99 mg/dL   Comment 1 Notify RN   Glucose,  capillary     Status: Abnormal   Collection Time: 07/26/14 12:05 PM  Result Value Ref Range   Glucose-Capillary 212 (H) 70 - 99 mg/dL   Comment 1 Notify RN   Glucose, capillary     Status: Abnormal   Collection Time: 07/26/14  5:19 PM  Result Value Ref Range   Glucose-Capillary 217 (H) 70 - 99 mg/dL   Comment 1 Notify RN   Glucose, capillary     Status: Abnormal   Collection Time: 07/26/14  7:37 PM  Result Value Ref Range   Glucose-Capillary 162 (H) 70 - 99 mg/dL   Comment 1 Notify RN   Glucose, capillary     Status: Abnormal   Collection Time: 07/26/14 11:56 PM  Result Value Ref Range   Glucose-Capillary 232 (H) 70 - 99 mg/dL  Glucose, capillary     Status: Abnormal   Collection Time: 07/27/14  4:00 AM  Result Value Ref Range   Glucose-Capillary 176 (H) 70 - 99 mg/dL   Comment 1 Notify RN   CBC     Status: Abnormal   Collection Time: 07/27/14  5:50 AM  Result Value Ref Range   WBC 9.3 4.0 - 10.5 K/uL   RBC 2.89 (L) 4.22 - 5.81 MIL/uL   Hemoglobin 8.9 (L) 13.0 - 17.0 g/dL   HCT 25.5 (L) 39.0 - 52.0 %   MCV 88.2 78.0 - 100.0 fL   MCH 30.8 26.0 - 34.0 pg   MCHC 34.9 30.0 - 36.0 g/dL   RDW 15.1 11.5 - 15.5 %   Platelets 152 150 - 400 K/uL  Basic metabolic panel     Status: Abnormal   Collection Time: 07/27/14  5:50 AM  Result Value Ref Range   Sodium 132 (L) 135 - 145 mmol/L   Potassium 3.9 3.5 - 5.1 mmol/L   Chloride 100 (L) 101 - 111 mmol/L   CO2 22 22 - 32 mmol/L   Glucose, Bld 166 (H) 70 - 99 mg/dL   BUN 33 (H) 6 - 20 mg/dL   Creatinine, Ser 1.18 0.61 - 1.24 mg/dL   Calcium 8.9 8.9 - 10.3 mg/dL   GFR calc non Af Amer 59 (L) >60 mL/min   GFR calc Af Amer >60 >60 mL/min    Comment: (NOTE) The eGFR has been calculated using the CKD EPI equation. This calculation has not been validated in all clinical situations. eGFR's persistently <90 mL/min signify possible Chronic Kidney Disease.    Anion gap 10 5 - 15  Vitamin B12     Status: Abnormal   Collection Time:  07/27/14  5:50 AM  Result Value Ref Range   Vitamin B-12 1149 (H) 180 - 914 pg/mL    Comment: (NOTE) This assay is not validated for testing neonatal or myeloproliferative syndrome specimens for Vitamin B12 levels.   Folate     Status: None   Collection Time: 07/27/14  5:50 AM  Result Value Ref Range   Folate 14.2 >5.9 ng/mL  Iron and TIBC     Status: Abnormal   Collection Time: 07/27/14  5:50 AM  Result Value Ref Range   Iron 39 (L) 45 - 182 ug/dL   TIBC 302 250 - 450 ug/dL   Saturation Ratios 13 (L) 17.9 - 39.5 %   UIBC 263 ug/dL  Ferritin     Status: None   Collection Time: 07/27/14  5:50 AM  Result Value Ref Range   Ferritin 226 24 - 336 ng/mL  Reticulocytes     Status: Abnormal   Collection Time: 07/27/14  5:50 AM  Result Value Ref Range   Retic Ct Pct 2.5 0.4 - 3.1 %   RBC. 2.89 (L) 4.22 - 5.81 MIL/uL   Retic Count, Manual 72.3 19.0 - 186.0 K/uL  Glucose, capillary     Status: Abnormal   Collection Time: 07/27/14  7:31 AM  Result Value Ref Range   Glucose-Capillary 161 (H) 70 - 99 mg/dL   Comment 1 Notify RN     Radiology/Results: Dg Chest 2 View  07/26/2014   CLINICAL DATA:  Pleural effusion.  EXAM: CHEST  2 VIEW  COMPARISON:  02/17/2012  FINDINGS: Patient's nasogastric tube in place, tip coiled back upon itself into the lower esophagus. Status post median sternotomy and CABG. Heart is enlarged. There is mild interstitial edema. There patchy infiltrates within the left upper lobe and left lower lobe. Left pleural effusion. Persistent wedge compression of T10, unchanged. Mild dilatation of small bowel loops, unchanged.  IMPRESSION: 1. Cardiomegaly and mild edema. 2. Left upper lobe and left lower lobe infiltrates associated with left pleural effusion. 3. Nasogastric tube coiled back upon itself into lower esophagus. 4. The salient findings were discussed with Lordis on 07/26/2014 at 8:04 pm.   Electronically Signed   By: Nolon Nations M.D.   On: 07/26/2014 20:04   Ct  Abdomen Pelvis W Contrast  07/25/2014   CLINICAL DATA:  Mid to lower abdominal pain since 12 hours ago. Vomiting.  EXAM: CT ABDOMEN AND PELVIS WITH CONTRAST  TECHNIQUE: Multidetector CT imaging of the abdomen and pelvis was performed using the standard protocol following bolus administration of intravenous contrast.  CONTRAST:  185m OMNIPAQUE IOHEXOL 300 MG/ML  SOLN  COMPARISON:  None.  FINDINGS: There is a left effusion layering dependently with patchy density in the dependent left lung that could be atelectasis or pneumonia.  The liver shows a pattern raising the possibility of early cirrhosis. There are 2 benign calcifications in the left lobe. There are calcified stones in the gallbladder neck. No stone is seen along the course of the common duct. The spleen is normal. There are bilateral low-density adrenal adenomas. The kidneys are normal. The aorta and its branch vessels show extensive atherosclerotic disease. No aneurysm. Bladder, prostate gland and seminal vesicles are unremarkable.  There are dilated fluid and air-filled loops of small intestine consistent with partial small bowel obstruction. There is a caliber change at the mid ileum. Specific obstructing lesion is not identified. There is free intraperitoneal fluid but no free intraperitoneal air are identified. Old vertebral augmentation at T10. Chronic bilateral pars defects at L5.  IMPRESSION: Partial small bowel obstruction. Level of the obstruction is probably mid ileum. Specific etiology not demonstrated.  Free fluid which could relate to the small bowel obstruction or could be secondary to cirrhosis. The patient does appear to have cirrhosis of the liver.  Gallstones.  Advanced  widespread atherosclerosis.  Left pleural effusion. Atelectasis and/or pneumonia in the dependent left lower lobe.   Electronically Signed   By: Nelson Chimes M.D.   On: 07/25/2014 21:28   Dg Abd Portable 1v  07/27/2014   CLINICAL DATA:  Small-bowel obstruction.  EXAM:  PORTABLE ABDOMEN - 1 VIEW  COMPARISON:  07/25/2014  FINDINGS: Nasogastric tube is partially imaged, tip overlying the level of the stomach. There are persistent dilated small bowel loops in the central abdomen. Nondilated loops of large bowel persist. There is a small amount of residual contrast in the bladder.  IMPRESSION: 1. Persistent small bowel dilatation. 2. Residual contrast in the bladder.   Electronically Signed   By: Nolon Nations M.D.   On: 07/27/2014 09:10   Dg Abd Portable 1v  07/25/2014   CLINICAL DATA:  Abdominal pain.  Nausea and vomiting for 1 day.  EXAM: PORTABLE ABDOMEN - 1 VIEW  COMPARISON:  CT of earlier in the day  FINDINGS: Single supine portable view. Contrast within the urinary bladder. Gastric distension with possible nasogastric tube, incompletely imaged. Proximal small bowel loops measure up to 3.2 cm. Gas and stool within the colon. Advanced atherosclerosis.  IMPRESSION: Partial small bowel obstruction, without free intraperitoneal air or other acute complication.   Electronically Signed   By: Abigail Miyamoto M.D.   On: 07/25/2014 23:46    Anti-infectives: Anti-infectives    Start     Dose/Rate Route Frequency Ordered Stop   07/25/14 2245  cefTRIAXone (ROCEPHIN) 1 g in dextrose 5 % 50 mL IVPB     1 g 100 mL/hr over 30 Minutes Intravenous Every 24 hours 07/25/14 2234     07/25/14 2245  azithromycin (ZITHROMAX) 500 mg in dextrose 5 % 250 mL IVPB     500 mg 250 mL/hr over 60 Minutes Intravenous Every 24 hours 07/25/14 2234        Assessment/Plan: Problem List: Patient Active Problem List   Diagnosis Date Noted  . SBO (small bowel obstruction) 07/25/2014  . Partial small bowel obstruction 07/25/2014  . 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 CAD (coronary artery disease)   . History of acute inferior wall MI   . Diabetes mellitus   . HTN (hypertension)   . Dyslipidemia     Xray reviewed;  Some colon gas noted .  Patient up to try to expell gas  for have BM.  Continue to follow.   * No surgery found *    LOS: 2 days   Matt B. Hassell Done, MD, [facility] [phone] beeper [phone]  07/27/2014 11:34 AM

## 2014-07-28 ENCOUNTER — Inpatient Hospital Stay (HOSPITAL_COMMUNITY): Payer: Medicare Other

## 2014-07-28 LAB — CBC WITH DIFFERENTIAL/PLATELET
BASOS PCT: 0 % (ref 0–1)
Basophils Absolute: 0 10*3/uL (ref 0.0–0.1)
Eosinophils Absolute: 0.1 10*3/uL (ref 0.0–0.7)
Eosinophils Relative: 1 % (ref 0–5)
HCT: 25.8 % — ABNORMAL LOW (ref 39.0–52.0)
Hemoglobin: 9 g/dL — ABNORMAL LOW (ref 13.0–17.0)
LYMPHS ABS: 0.9 10*3/uL (ref 0.7–4.0)
LYMPHS PCT: 11 % — AB (ref 12–46)
MCH: 31.4 pg (ref 26.0–34.0)
MCHC: 34.9 g/dL (ref 30.0–36.0)
MCV: 89.9 fL (ref 78.0–100.0)
Monocytes Absolute: 0.9 10*3/uL (ref 0.1–1.0)
Monocytes Relative: 11 % (ref 3–12)
NEUTROS ABS: 6 10*3/uL (ref 1.7–7.7)
NEUTROS PCT: 77 % (ref 43–77)
PLATELETS: 148 10*3/uL — AB (ref 150–400)
RBC: 2.87 MIL/uL — AB (ref 4.22–5.81)
RDW: 15.4 % (ref 11.5–15.5)
WBC: 7.8 10*3/uL (ref 4.0–10.5)

## 2014-07-28 LAB — BASIC METABOLIC PANEL
Anion gap: 9 (ref 5–15)
BUN: 22 mg/dL — ABNORMAL HIGH (ref 6–20)
CALCIUM: 8.7 mg/dL — AB (ref 8.9–10.3)
CO2: 23 mmol/L (ref 22–32)
Chloride: 101 mmol/L (ref 101–111)
Creatinine, Ser: 0.86 mg/dL (ref 0.61–1.24)
GFR calc non Af Amer: >60 mL/min (ref >60)
Glucose, Bld: 171 mg/dL — ABNORMAL HIGH (ref 70–99)
Potassium: 3.7 mmol/L (ref 3.5–5.1)
SODIUM: 133 mmol/L — AB (ref 135–145)

## 2014-07-28 LAB — HEMOGLOBIN A1C
Hgb A1c MFr Bld: 6.4 % — ABNORMAL HIGH (ref 4.8–5.6)
Hgb A1c MFr Bld: 6.4 % — ABNORMAL HIGH (ref 4.8–5.6)
Mean Plasma Glucose: 137 mg/dL
Mean Plasma Glucose: 137 mg/dL

## 2014-07-28 LAB — GLUCOSE, CAPILLARY
GLUCOSE-CAPILLARY: 170 mg/dL — AB (ref 70–99)
Glucose-Capillary: 132 mg/dL — ABNORMAL HIGH (ref 70–99)
Glucose-Capillary: 143 mg/dL — ABNORMAL HIGH (ref 70–99)
Glucose-Capillary: 143 mg/dL — ABNORMAL HIGH (ref 70–99)
Glucose-Capillary: 145 mg/dL — ABNORMAL HIGH (ref 70–99)

## 2014-07-28 MED ORDER — DIATRIZOATE MEGLUMINE & SODIUM 66-10 % PO SOLN
90.0000 mL | Freq: Once | ORAL | Status: AC
  Administered 2014-07-28: 90 mL via NASOGASTRIC
  Filled 2014-07-28 (×2): qty 90

## 2014-07-28 MED ORDER — ONDANSETRON HCL 4 MG/2ML IJ SOLN
4.0000 mg | Freq: Four times a day (QID) | INTRAMUSCULAR | Status: DC | PRN
  Administered 2014-07-28 – 2014-08-06 (×4): 4 mg via INTRAVENOUS
  Filled 2014-07-28 (×3): qty 2

## 2014-07-28 NOTE — Progress Notes (Signed)
[facility] PROGRESS NOTE  KIARA KEEP IPJ:[id number] DOB: Jan 06, 1941 DOA: 07/25/2014 PCP:  Melinda Crutch, MD  Assessment/Plan: 1. Sbo: NPO, NG tube in placed  on intermittent suction , even after 48 hours, his abd film shows persistent sbo and his abdomen shows persistent distention. Surgery consulted and recommendations given.  Recommend ambulation in the hallway.  Comfortable.   Diabetes mellitus: CBG (last 3)   Recent Labs  07/28/14 0800 07/28/14 1203 07/28/14 1556  GLUCAP 132* 143* 143*    On SSI.  hgba1c is 6.4   Hypokalemia: Repleted as needed.   Hypertension:  well controlled  Anemia: Normocytic. Anemia panel ordered.  Normal ferritin and low iron levels. Iron supplementation on discharge.    Questionable left pleural effusion on CT abdomen> and CAP on CXR.  He is currently asymptomatic, denies any cough or sob.  He is afebrile and there mild leukocytosis., clinically he doesn't appear to have pneumonia. But CXR showed patchy infiltrates over the upper lobe and left lower lobe and he was empirically started on antibiotics. Will need a repeat CXR in 4 to 6 weeks to evaluate for resolution of the pneumonia.     Code Status: full code.  Family Communication: wife at bedside.  Disposition Plan: pending further investigation.   Consultants:  Surgery.   Procedures:  none  Antibiotics:  none  HPI/Subjective: Ambulating in the hallway. No bm yet.  Objective: Filed Vitals:   07/28/14 1709  BP: 129/56  Pulse: 70  Temp:   Resp:     Intake/Output Summary (Last 24 hours) at 07/28/14 1818 Last data filed at 07/28/14 1558  Gross per 24 hour  Intake 1101.25 ml  Output   1050 ml  Net  51.25 ml   Filed Weights   07/26/14 0459 07/27/14 0500 07/28/14 0422  Weight: 82.6 kg (182 lb 1.6 oz) 83.099 kg (183 lb 3.2 oz) 84.687 kg (186 lb 11.2 oz)    Exam:   General:  Alert and comfortable.   Cardiovascular: s1s2,  Respiratory: clear to  auscultation, no wheezing or rhonchi  Abdomen: soft non tender mild distended.   Musculoskeletal: no pedal edema.   Data Reviewed: Basic Metabolic Panel:  Recent Labs Lab 07/25/14 1828 07/26/14 0411 07/27/14 0550 07/28/14 0400  NA 132* 130* 132* 133*  K 3.3* 3.9 3.9 3.7  CL 97 96 100* 101  CO2 22 19 22 23   GLUCOSE 89 242* 166* 171*  BUN 18 23 33* 22*  CREATININE 1.06 1.06 1.18 0.86  CALCIUM 9.7 8.9 8.9 8.7*   Liver Function Tests:  Recent Labs Lab 07/25/14 1828 07/26/14 0411  AST 27 26  ALT 18 16  ALKPHOS 109 106  BILITOT 1.6* 1.1  PROT 7.5 6.9  ALBUMIN 4.1 3.7    Recent Labs Lab 07/25/14 1828  LIPASE 31   No results for input(s): AMMONIA in the last 168 hours. CBC:  Recent Labs Lab 07/25/14 1828 07/26/14 0411 07/27/14 0550 07/28/14 0400  WBC 9.5 12.3* 9.3 7.8  NEUTROABS 8.0*  --   --  6.0  HGB 11.0* 10.6* 8.9* 9.0*  HCT 30.5* 30.6* 25.5* 25.8*  MCV 86.6 89.0 88.2 89.9  PLT 175 152 152 148*   Cardiac Enzymes: No results for input(s): CKTOTAL, CKMB, CKMBINDEX, TROPONINI in the last 168 hours. BNP (last 3 results) No results for input(s): BNP in the last 8760 hours.  ProBNP (last 3 results) No results for input(s): PROBNP in the last 8760 hours.  CBG:  Recent Labs Lab  07/27/14 2352 07/28/14 0413 07/28/14 0800 07/28/14 1203 07/28/14 1556  GLUCAP 155* 170* 132* 143* 143*    No results found for this or any previous visit (from the past 240 hour(s)).   Studies: Dg Chest 2 View  07/26/2014   CLINICAL DATA:  Pleural effusion.  EXAM: CHEST  2 VIEW  COMPARISON:  02/17/2012  FINDINGS: Patient's nasogastric tube in place, tip coiled back upon itself into the lower esophagus. Status post median sternotomy and CABG. Heart is enlarged. There is mild interstitial edema. There patchy infiltrates within the left upper lobe and left lower lobe. Left pleural effusion. Persistent wedge compression of T10, unchanged. Mild dilatation of small bowel loops,  unchanged.  IMPRESSION: 1. Cardiomegaly and mild edema. 2. Left upper lobe and left lower lobe infiltrates associated with left pleural effusion. 3. Nasogastric tube coiled back upon itself into lower esophagus. 4. The salient findings were discussed with Lordis on 07/26/2014 at 8:04 pm.   Electronically Signed   By: Nolon Nations M.D.   On: 07/26/2014 20:04   Dg Abd 2 Views  07/28/2014   CLINICAL DATA:  Ileus, nausea.  EXAM: ABDOMEN - 2 VIEW  COMPARISON:  07/27/2014  FINDINGS: NG tube is present in the stomach. There are dilated small bowel loops with air-fluid levels. Degree of small bowel dilatation has increased since prior study. Colon is decompressed. No free air organomegaly. No suspicious calcification. Vascular calcifications noted in the aorta and iliac vessels. No acute bony abnormality.  IMPRESSION: Worsening small bowel dilatation compatible with worsening small bowel obstruction.   Electronically Signed   By: Rolm Baptise M.D.   On: 07/28/2014 08:57   Dg Abd Portable 1v  07/27/2014   CLINICAL DATA:  Small-bowel obstruction.  EXAM: PORTABLE ABDOMEN - 1 VIEW  COMPARISON:  07/25/2014  FINDINGS: Nasogastric tube is partially imaged, tip overlying the level of the stomach. There are persistent dilated small bowel loops in the central abdomen. Nondilated loops of large bowel persist. There is a small amount of residual contrast in the bladder.  IMPRESSION: 1. Persistent small bowel dilatation. 2. Residual contrast in the bladder.   Electronically Signed   By: Nolon Nations M.D.   On: 07/27/2014 09:10    Scheduled Meds: . amLODipine  5 mg Oral Daily  . antiseptic oral rinse  7 mL Mouth Rinse BID  . aspirin EC  81 mg Oral Daily  . azithromycin  500 mg Intravenous Q24H  . carvedilol  12.5 mg Oral BID WC  . cefTRIAXone (ROCEPHIN)  IV  1 g Intravenous Q24H  . clopidogrel  75 mg Oral Daily  . insulin aspart  0-9 Units Subcutaneous Q4H  . losartan  100 mg Oral Daily  . simvastatin  20 mg Oral  QHS   Continuous Infusions: . sodium chloride 75 mL/hr at 07/28/14 1417    Active Problems:   SBO (small bowel obstruction)   Partial small bowel obstruction    Time spent: 25 minuttes    Usbaldo Pannone  [facility] Pager [phone] If 7PM-7AM, please contact night-coverage at [email or web address], password [facility] 07/28/2014, 6:18 PM  LOS: 3 days

## 2014-07-28 NOTE — Progress Notes (Signed)
Patient ID: [name], male   DOB: [birth year], 74 y.o.   MRN: [id number]    Subjective: Pt feels a little more bloated today.  Had a very small BM yesterday at 11:30am.  Nothing since.  Pain improved since admission  Objective: Vital signs in last 24 hours: Temp:  [98.7 F (37.1 C)-99.8 F (37.7 C)] 98.8 F (37.1 C) (05/02 0422) Pulse Rate:  [71-73] 72 (05/02 0840) Resp:  [18] 18 (05/02 0422) BP: (131-149)/(60-74) 138/61 mmHg (05/02 0936) SpO2:  [93 %-95 %] 95 % (05/02 0422) Weight:  [84.687 kg (186 lb 11.2 oz)] 84.687 kg (186 lb 11.2 oz) (05/02 0422) Last BM Date: 07/27/14  Intake/Output from previous day: 05/01 0701 - 05/02 0700 In: 1705 [I.V.:1705] Out: 725 [Emesis/NG output:725] Intake/Output this shift:    PE: Abd: soft, but apparently more distended today, few BS, minimally tender, NGT with bilious output Heart: regular Lungs: CTAB  Lab Results:   Recent Labs  07/27/14 0550 07/28/14 0400  WBC 9.3 7.8  HGB 8.9* 9.0*  HCT 25.5* 25.8*  PLT 152 148*   BMET  Recent Labs  07/27/14 0550 07/28/14 0400  NA 132* 133*  K 3.9 3.7  CL 100* 101  CO2 22 23  GLUCOSE 166* 171*  BUN 33* 22*  CREATININE 1.18 0.86  CALCIUM 8.9 8.7*   PT/INR No results for input(s): LABPROT, INR in the last 72 hours. CMP     Component Value Date/Time   NA 133* 07/28/2014 0400   K 3.7 07/28/2014 0400   CL 101 07/28/2014 0400   CO2 23 07/28/2014 0400   GLUCOSE 171* 07/28/2014 0400   BUN 22* 07/28/2014 0400   CREATININE 0.86 07/28/2014 0400   CALCIUM 8.7* 07/28/2014 0400   PROT 6.9 07/26/2014 0411   ALBUMIN 3.7 07/26/2014 0411   AST 26 07/26/2014 0411   ALT 16 07/26/2014 0411   ALKPHOS 106 07/26/2014 0411   BILITOT 1.1 07/26/2014 0411   GFRNONAA >60 07/28/2014 0400   GFRAA >60 07/28/2014 0400   Lipase     Component Value Date/Time   LIPASE 31 07/25/2014 1828       Studies/Results: Dg Chest 2 View  07/26/2014   CLINICAL DATA:  Pleural effusion.  EXAM: CHEST  2  VIEW  COMPARISON:  02/17/2012  FINDINGS: Patient's nasogastric tube in place, tip coiled back upon itself into the lower esophagus. Status post median sternotomy and CABG. Heart is enlarged. There is mild interstitial edema. There patchy infiltrates within the left upper lobe and left lower lobe. Left pleural effusion. Persistent wedge compression of T10, unchanged. Mild dilatation of small bowel loops, unchanged.  IMPRESSION: 1. Cardiomegaly and mild edema. 2. Left upper lobe and left lower lobe infiltrates associated with left pleural effusion. 3. Nasogastric tube coiled back upon itself into lower esophagus. 4. The salient findings were discussed with Lordis on 07/26/2014 at 8:04 pm.   Electronically Signed   By: Nolon Nations M.D.   On: 07/26/2014 20:04   Dg Abd 2 Views  07/28/2014   CLINICAL DATA:  Ileus, nausea.  EXAM: ABDOMEN - 2 VIEW  COMPARISON:  07/27/2014  FINDINGS: NG tube is present in the stomach. There are dilated small bowel loops with air-fluid levels. Degree of small bowel dilatation has increased since prior study. Colon is decompressed. No free air organomegaly. No suspicious calcification. Vascular calcifications noted in the aorta and iliac vessels. No acute bony abnormality.  IMPRESSION: Worsening small bowel dilatation compatible with worsening small bowel obstruction.  Electronically Signed   By: Rolm Baptise M.D.   On: 07/28/2014 08:57   Dg Abd Portable 1v  07/27/2014   CLINICAL DATA:  Small-bowel obstruction.  EXAM: PORTABLE ABDOMEN - 1 VIEW  COMPARISON:  07/25/2014  FINDINGS: Nasogastric tube is partially imaged, tip overlying the level of the stomach. There are persistent dilated small bowel loops in the central abdomen. Nondilated loops of large bowel persist. There is a small amount of residual contrast in the bladder.  IMPRESSION: 1. Persistent small bowel dilatation. 2. Residual contrast in the bladder.   Electronically Signed   By: Nolon Nations M.D.   On: 07/27/2014 09:10     Anti-infectives: Anti-infectives    Start     Dose/Rate Route Frequency Ordered Stop   07/25/14 2245  cefTRIAXone (ROCEPHIN) 1 g in dextrose 5 % 50 mL IVPB     1 g 100 mL/hr over 30 Minutes Intravenous Every 24 hours 07/25/14 2234     07/25/14 2245  azithromycin (ZITHROMAX) 500 mg in dextrose 5 % 250 mL IVPB     500 mg 250 mL/hr over 60 Minutes Intravenous Every 24 hours 07/25/14 2234         Assessment/Plan  1. HD 3, SBO  -no prior abdominal surgery, no hernias or masses noted -will initiate SBO protocol today -cont NGT to suction, repeat films in 8 hours after contrast given   LOS: 3 days    Amany Rando E 07/28/2014, 10:11 AM Pager: [phone]

## 2014-07-28 NOTE — Progress Notes (Signed)
UR COMPLETED  

## 2014-07-29 ENCOUNTER — Inpatient Hospital Stay (HOSPITAL_COMMUNITY): Payer: Medicare Other

## 2014-07-29 DIAGNOSIS — J189 Pneumonia, unspecified organism: Secondary | ICD-10-CM | POA: Diagnosis present

## 2014-07-29 DIAGNOSIS — E876 Hypokalemia: Secondary | ICD-10-CM | POA: Diagnosis present

## 2014-07-29 LAB — BASIC METABOLIC PANEL
Anion gap: 12 (ref 5–15)
BUN: 19 mg/dL (ref 6–20)
CHLORIDE: 100 mmol/L — AB (ref 101–111)
CO2: 22 mmol/L (ref 22–32)
Calcium: 9 mg/dL (ref 8.9–10.3)
Creatinine, Ser: 0.77 mg/dL (ref 0.61–1.24)
GFR calc Af Amer: >60 mL/min (ref >60)
GLUCOSE: 132 mg/dL — AB (ref 70–99)
POTASSIUM: 3.2 mmol/L — AB (ref 3.5–5.1)
SODIUM: 134 mmol/L — AB (ref 135–145)

## 2014-07-29 LAB — GLUCOSE, CAPILLARY
GLUCOSE-CAPILLARY: 120 mg/dL — AB (ref 70–99)
GLUCOSE-CAPILLARY: 158 mg/dL — AB (ref 70–99)
Glucose-Capillary: 120 mg/dL — ABNORMAL HIGH (ref 70–99)
Glucose-Capillary: 122 mg/dL — ABNORMAL HIGH (ref 70–99)
Glucose-Capillary: 150 mg/dL — ABNORMAL HIGH (ref 70–99)
Glucose-Capillary: 171 mg/dL — ABNORMAL HIGH (ref 70–99)

## 2014-07-29 LAB — MAGNESIUM: Magnesium: 1.9 mg/dL (ref 1.7–2.4)

## 2014-07-29 MED ORDER — POTASSIUM CHLORIDE 10 MEQ/100ML IV SOLN
10.0000 meq | INTRAVENOUS | Status: AC
  Administered 2014-07-29 (×2): 10 meq via INTRAVENOUS
  Filled 2014-07-29 (×2): qty 100

## 2014-07-29 NOTE — Progress Notes (Signed)
[facility] PROGRESS NOTE  [name] ZOX:[id number] DOB: 10-21-1940 DOA: 07/25/2014 PCP:  Melinda Crutch, MD Interim summary: 74 year old male with hypertension, diabetes mellitus, CAD, hyperlipidemia admitted for abdominal pain, was found to have SBO.  Assessment/Plan: 1. Sbo: NPO, NG tube in placed  on intermittent suction , even after 48 hours, his abd film shows persistent sbo and his abdomen shows persistent distention. Surgery consulted and recommendations given.  Recommend ambulation in the hallway.  Comfortable.   Diabetes mellitus: CBG (last 3)   Recent Labs  07/29/14 0758 07/29/14 1219 07/29/14 1626  GLUCAP 120* 158* 150*    On SSI.  hgba1c is 6.4   Hypokalemia: Repleted as needed. And recheck in am.  Check magnesium levels. Replace if needed.   Hypertension:  well controlled  Anemia: Normocytic. Anemia panel ordered.  Normal ferritin and low iron levels. Iron supplementation on discharge.    Questionable left pleural effusion on CT abdomen> and CAP on CXR.  He is currently asymptomatic, denies any cough or sob.  He is afebrile and there mild leukocytosis., clinically he doesn't appear to have pneumonia. But CXR showed patchy infiltrates over the upper lobe and left lower lobe and he was empirically started on antibiotics. Will need a repeat CXR in 4 to 6 weeks to evaluate for resolution of the pneumonia.     Code Status: full code.  Family Communication: wife at bedside.  Disposition Plan: pending further investigation.   Consultants:  Surgery.   Procedures:  none  Antibiotics:  none  HPI/Subjective: Ambulating in the hallway. No bm yet today.   Objective: Filed Vitals:   07/29/14 1410  BP: 147/58  Pulse: 74  Temp: 98.7 F (37.1 C)  Resp: 16    Intake/Output Summary (Last 24 hours) at 07/29/14 1750 Last data filed at 07/29/14 1749  Gross per 24 hour  Intake 4165.25 ml  Output    925 ml  Net 3240.25 ml   Filed Weights   07/27/14 0500 07/28/14 0422 07/29/14 0431  Weight: 83.099 kg (183 lb 3.2 oz) 84.687 kg (186 lb 11.2 oz) 83.008 kg (183 lb)    Exam:   General:  Alert and comfortable.   Cardiovascular: s1s2,  Respiratory: clear to auscultation, no wheezing or rhonchi  Abdomen: soft non tender mild distended.no bowel sounds   Musculoskeletal: no pedal edema.   Data Reviewed: Basic Metabolic Panel:  Recent Labs Lab 07/25/14 1828 07/26/14 0411 07/27/14 0550 07/28/14 0400 07/29/14 1650  NA 132* 130* 132* 133* 134*  K 3.3* 3.9 3.9 3.7 3.2*  CL 97 96 100* 101 100*  CO2 22 19 22 23 22   GLUCOSE 89 242* 166* 171* 132*  BUN 18 23 33* 22* 19  CREATININE 1.06 1.06 1.18 0.86 0.77  CALCIUM 9.7 8.9 8.9 8.7* 9.0   Liver Function Tests:  Recent Labs Lab 07/25/14 1828 07/26/14 0411  AST 27 26  ALT 18 16  ALKPHOS 109 106  BILITOT 1.6* 1.1  PROT 7.5 6.9  ALBUMIN 4.1 3.7    Recent Labs Lab 07/25/14 1828  LIPASE 31   No results for input(s): AMMONIA in the last 168 hours. CBC:  Recent Labs Lab 07/25/14 1828 07/26/14 0411 07/27/14 0550 07/28/14 0400  WBC 9.5 12.3* 9.3 7.8  NEUTROABS 8.0*  --   --  6.0  HGB 11.0* 10.6* 8.9* 9.0*  HCT 30.5* 30.6* 25.5* 25.8*  MCV 86.6 89.0 88.2 89.9  PLT 175 152 152 148*   Cardiac Enzymes: No results  for input(s): CKTOTAL, CKMB, CKMBINDEX, TROPONINI in the last 168 hours. BNP (last 3 results) No results for input(s): BNP in the last 8760 hours.  ProBNP (last 3 results) No results for input(s): PROBNP in the last 8760 hours.  CBG:  Recent Labs Lab 07/29/14 0007 07/29/14 0428 07/29/14 0758 07/29/14 1219 07/29/14 1626  GLUCAP 171* 120* 120* 158* 150*    No results found for this or any previous visit (from the past 240 hour(s)).   Studies: Dg Abd 2 Views  07/28/2014   CLINICAL DATA:  Ileus, nausea.  EXAM: ABDOMEN - 2 VIEW  COMPARISON:  07/27/2014  FINDINGS: NG tube is present in the stomach. There are dilated small bowel loops with  air-fluid levels. Degree of small bowel dilatation has increased since prior study. Colon is decompressed. No free air organomegaly. No suspicious calcification. Vascular calcifications noted in the aorta and iliac vessels. No acute bony abnormality.  IMPRESSION: Worsening small bowel dilatation compatible with worsening small bowel obstruction.   Electronically Signed   By: Rolm Baptise M.D.   On: 07/28/2014 08:57   Dg Abd Portable 1v  07/29/2014   CLINICAL DATA:  24 hour delay with Gastrografin. Small bowel protocol.  EXAM: PORTABLE ABDOMEN - 1 VIEW  COMPARISON:  07/28/2014  FINDINGS: Persistent gaseous distended loops of small bowel are demonstrated within the central abdomen measuring up to 3.5 cm, compatible with small bowel obstruction. Oral contrast material is demonstrated to the right lower quadrant, within the cecum and proximal ascending colon. NG tube projects over the left upper quadrant. Left upper quadrant surgical clips. Lower lumbar spine degenerative changes.  IMPRESSION: Oral contrast material demonstrated within the right lower quadrant, likely within the cecum and ascending colon.  Persistent gaseous distended loops of small bowel, compatible with obstruction.   Electronically Signed   By: Lovey Newcomer M.D.   On: 07/29/2014 13:40   Dg Abd Portable 1v-small Bowel Obstruction Protocol-initial, 8 Hr Delay  07/29/2014   CLINICAL DATA:  Small bowel obstruction  EXAM: PORTABLE ABDOMEN - 1 VIEW  COMPARISON:  07/28/2014  FINDINGS: The nasogastric tube extends into the proximal stomach with the side-port in the region of the EG junction.  There is persistent dilatation and stacking of small bowel loops consistent with small bowel obstruction. No free air is evident. No interval change is evident.  IMPRESSION: Persistent small bowel obstruction.   Electronically Signed   By: Andreas Newport M.D.   On: 07/29/2014 02:27    Scheduled Meds: . amLODipine  5 mg Oral Daily  . antiseptic oral rinse  7  mL Mouth Rinse BID  . aspirin EC  81 mg Oral Daily  . azithromycin  500 mg Intravenous Q24H  . carvedilol  12.5 mg Oral BID WC  . cefTRIAXone (ROCEPHIN)  IV  1 g Intravenous Q24H  . insulin aspart  0-9 Units Subcutaneous Q4H  . losartan  100 mg Oral Daily  . potassium chloride  10 mEq Intravenous Q1 Hr x 2  . simvastatin  20 mg Oral QHS   Continuous Infusions: . sodium chloride 75 mL/hr at 07/29/14 0556    Active Problems:   SBO (small bowel obstruction)   Partial small bowel obstruction    Time spent: 25 minuttes    Siearra Amberg  [facility] Pager [phone] If 7PM-7AM, please contact night-coverage at [email or web address], password [facility] 07/29/2014, 5:50 PM  LOS: 4 days

## 2014-07-29 NOTE — Care Management Note (Signed)
Case Management Note  Patient Details  Name: ELBIE STATZER MRN: [id number] Date of Birth: [birth year]  Subjective/Objective:                    Action/Plan:   Expected Discharge Date:  08/01/14               Expected Discharge Plan:  Home/Self Care  In-House Referral:     Discharge planning Services     Post Acute Care Choice:    Choice offered to:     DME Arranged:    DME Agency:     HH Arranged:    [place] Agency:     Status of Service:     Medicare Important Message Given:    Date Medicare IM Given:  07/29/14 Medicare IM give by:  Magdalen Spatz RN  Date Additional Medicare IM Given:    Additional Medicare Important Message give by:     If discussed at [place] of Stay Meetings, dates discussed:    Additional Comments:  Marilu Favre, RN 07/29/2014, 8:10 AM

## 2014-07-29 NOTE — Progress Notes (Signed)
Patient ID: [name], male   DOB: Oct 08, 1940, 74 y.o.   MRN: [id number]    Subjective: Pt feels about the same today.  No BM or flatus.  Feels more distended today 8-hr delay films with contrast still in SB.  No progression to colon Objective: Vital signs in last 24 hours: Temp:  [98.4 F (36.9 C)-99.3 F (37.4 C)] 98.4 F (36.9 C) (05/03 0431) Pulse Rate:  [67-72] 72 (05/03 0431) Resp:  [18] 18 (05/03 0431) BP: (129-151)/(54-61) 151/61 mmHg (05/03 0431) SpO2:  [95 %-97 %] 95 % (05/03 0431) Weight:  [83.008 kg (183 lb)] 83.008 kg (183 lb) (05/03 0431) Last BM Date: 2014-08-19  Intake/Output from previous day: 08-19-22 0701 - 05/03 0700 In: 2024 [I.V.:2024] Out: 1525 [Urine:200; Emesis/NG output:1325] Intake/Output this shift:    PE: Abd: soft, but increasingly distended, but not tight or really tympanitic.  Some BS, NGT with bilious output.  Minimally tender Heart: regular Lungs: CTAB  Lab Results:   Recent Labs  07/27/14 0550 08/19/14 0400  WBC 9.3 7.8  HGB 8.9* 9.0*  HCT 25.5* 25.8*  PLT 152 148*   BMET  Recent Labs  07/27/14 0550 19-Aug-2014 0400  NA 132* 133*  K 3.9 3.7  CL 100* 101  CO2 22 23  GLUCOSE 166* 171*  BUN 33* 22*  CREATININE 1.18 0.86  CALCIUM 8.9 8.7*   PT/INR No results for input(s): LABPROT, INR in the last 72 hours. CMP     Component Value Date/Time   NA 133* 08-19-2014 0400   K 3.7 19-Aug-2014 0400   CL 101 2014/08/19 0400   CO2 23 08-19-14 0400   GLUCOSE 171* 2014-08-19 0400   BUN 22* Aug 19, 2014 0400   CREATININE 0.86 08/19/14 0400   CALCIUM 8.7* 08-19-14 0400   PROT 6.9 07/26/2014 0411   ALBUMIN 3.7 07/26/2014 0411   AST 26 07/26/2014 0411   ALT 16 07/26/2014 0411   ALKPHOS 106 07/26/2014 0411   BILITOT 1.1 07/26/2014 0411   GFRNONAA >60 Aug 19, 2014 0400   GFRAA >60 08-19-14 0400   Lipase     Component Value Date/Time   LIPASE 31 07/25/2014 1828       Studies/Results: Dg Abd 2 Views  Aug 19, 2014   CLINICAL  DATA:  Ileus, nausea.  EXAM: ABDOMEN - 2 VIEW  COMPARISON:  07/27/2014  FINDINGS: NG tube is present in the stomach. There are dilated small bowel loops with air-fluid levels. Degree of small bowel dilatation has increased since prior study. Colon is decompressed. No free air organomegaly. No suspicious calcification. Vascular calcifications noted in the aorta and iliac vessels. No acute bony abnormality.  IMPRESSION: Worsening small bowel dilatation compatible with worsening small bowel obstruction.   Electronically Signed   By: Rolm Baptise M.D.   On: 2014-08-19 08:57   Dg Abd Portable 1v-small Bowel Obstruction Protocol-initial, 8 Hr Delay  07/29/2014   CLINICAL DATA:  Small bowel obstruction  EXAM: PORTABLE ABDOMEN - 1 VIEW  COMPARISON:  Aug 19, 2014  FINDINGS: The nasogastric tube extends into the proximal stomach with the side-port in the region of the EG junction.  There is persistent dilatation and stacking of small bowel loops consistent with small bowel obstruction. No free air is evident. No interval change is evident.  IMPRESSION: Persistent small bowel obstruction.   Electronically Signed   By: Andreas Newport M.D.   On: 07/29/2014 02:27    Anti-infectives: Anti-infectives    Start     Dose/Rate Route Frequency Ordered Stop  07/25/14 2245  cefTRIAXone (ROCEPHIN) 1 g in dextrose 5 % 50 mL IVPB     1 g 100 mL/hr over 30 Minutes Intravenous Every 24 hours 07/25/14 2234     07/25/14 2245  azithromycin (ZITHROMAX) 500 mg in dextrose 5 % 250 mL IVPB     500 mg 250 mL/hr over 60 Minutes Intravenous Every 24 hours 07/25/14 2234         Assessment/Plan   1. HD 4, SBO   -no prior abdominal surgery, no hernias or masses noted  -SB protocol shows no progression of contrast yet into the colon.  His 24-hr delay films will be done today around 12:30.  If there is still no progression or improvement, then will tentatively plan for surgery tomorrow for unresolving SBO.  I have d/w the patient and  his wife.  They understand and are agreeable with that plan, if need be.  2.  Gallstones 3.  Atherosclerosis 4.  Left pleural effusion 5.  History of CAD - followed by Dr. P. [place]   LOS: 4 days    OSBORNE,KELLY E 07/29/2014, 10:37 AM Pager: [phone]  Agree with above. No history of prior surgery.  Abdomen is benign, but SBO persists.  He has had some small BM's.  Alphonsa Overall, MD, [facility] Pager: [phone] Office phone:  [phone]

## 2014-07-30 ENCOUNTER — Encounter (HOSPITAL_COMMUNITY)
Admission: EM | Disposition: A | Discharge status: Home / Self Care | Payer: Self-pay | Source: Home / Self Care | Attending: Internal Medicine

## 2014-07-30 ENCOUNTER — Inpatient Hospital Stay (HOSPITAL_COMMUNITY): Payer: Medicare Other

## 2014-07-30 DIAGNOSIS — I10 Essential (primary) hypertension: Secondary | ICD-10-CM

## 2014-07-30 DIAGNOSIS — J189 Pneumonia, unspecified organism: Secondary | ICD-10-CM

## 2014-07-30 DIAGNOSIS — I2510 Atherosclerotic heart disease of native coronary artery without angina pectoris: Secondary | ICD-10-CM

## 2014-07-30 DIAGNOSIS — E876 Hypokalemia: Secondary | ICD-10-CM

## 2014-07-30 DIAGNOSIS — E119 Type 2 diabetes mellitus without complications: Secondary | ICD-10-CM

## 2014-07-30 LAB — BASIC METABOLIC PANEL
Anion gap: 11 (ref 5–15)
BUN: 19 mg/dL (ref 6–20)
CO2: 22 mmol/L (ref 22–32)
Calcium: 8.6 mg/dL — ABNORMAL LOW (ref 8.9–10.3)
Chloride: 99 mmol/L — ABNORMAL LOW (ref 101–111)
Creatinine, Ser: 0.74 mg/dL (ref 0.61–1.24)
GFR calc Af Amer: >60 mL/min (ref >60)
Glucose, Bld: 150 mg/dL — ABNORMAL HIGH (ref 70–99)
POTASSIUM: 3.3 mmol/L — AB (ref 3.5–5.1)
SODIUM: 132 mmol/L — AB (ref 135–145)

## 2014-07-30 LAB — GLUCOSE, CAPILLARY
GLUCOSE-CAPILLARY: 124 mg/dL — AB (ref 70–99)
GLUCOSE-CAPILLARY: 132 mg/dL — AB (ref 70–99)
GLUCOSE-CAPILLARY: 139 mg/dL — AB (ref 70–99)
GLUCOSE-CAPILLARY: 164 mg/dL — AB (ref 70–99)
Glucose-Capillary: 131 mg/dL — ABNORMAL HIGH (ref 70–99)
Glucose-Capillary: 159 mg/dL — ABNORMAL HIGH (ref 70–99)
Glucose-Capillary: 160 mg/dL — ABNORMAL HIGH (ref 70–99)

## 2014-07-30 LAB — SURGICAL PCR SCREEN
MRSA, PCR: NEGATIVE
Staphylococcus aureus: NEGATIVE

## 2014-07-30 SURGERY — LAPAROTOMY, EXPLORATORY
Anesthesia: General

## 2014-07-30 MED ORDER — POTASSIUM CHLORIDE 10 MEQ/100ML IV SOLN
10.0000 meq | INTRAVENOUS | Status: AC
  Administered 2014-07-30 (×3): 10 meq via INTRAVENOUS
  Filled 2014-07-30: qty 100

## 2014-07-30 MED ORDER — CEFAZOLIN SODIUM-DEXTROSE 2-3 GM-% IV SOLR: 2.0000 g | INTRAVENOUS | Status: AC

## 2014-07-30 NOTE — Progress Notes (Signed)
[facility]                                                                              Patient Demographics  [name], is a 74 y.o. male, DOB - [birth year], ZOX:[id number]  Admit date - 07/25/2014   Admitting Physician Jani Gravel, MD  Outpatient Primary MD for the patient is  Melinda Crutch, MD  LOS - 5   Chief Complaint  Patient presents with  . Abdominal Pain       Brief HPI   Patient is a 74 year old male with CAD, diabetes type 2, hypertension, hyperlipidemia presented with abdominal pain and described as periumbilical, sharp with nausea and vomiting. He denied any fevers or chills, diarrhea or melena or hematochezia. Patient underwent CT of the abdomen and pelvis which showed partial small bowel traction. Patient was admitted for further workup.  Assessment & Plan    Principal Problem:   SBO (small bowel obstruction) -Currently NPO, NG tube in place, Gen. surgery following - Abdominal x-ray this morning with persistent unchanged dilated loops of small bowel consistent with small bowel obstruction, no free air - Patient had 2 BMs this morning, surgery following closely, holding off on surgery today   Active Problems:   CAD (coronary artery disease): - Currently stable, no chest pain or shortness of breath    Diabetes mellitus - Hemoglobin A1c 6.4, continue sliding scale insulin    HTN (hypertension) - Well-controlled, continue Norvasc, Coreg  Questionable left pleural effusion on CT abdomen, CAP on CXR.  - No coughing or respiratory symptoms, afebrile. Chest x-ray showed patchy infiltrates in the upper lobe and left lower lobe, continue IV antibiotics for now, repeat chest x-ray in 4-6 weeks to ensure complete resolution of pneumonia.  Normocytic anemia - Currently stable, recheck CBC  Hypokalemia - Replace  Code Status:Full code   Family Communication: Discussed in detail with the patient, all imaging results, lab results explained to the  patientand and wife  Disposition Plan: not medically ready  Time Spent in minutes  25 minutes  Procedures  abd Xray CT abd  Consults   Surgery  DVT Prophylaxis  SCD's  Medications  Scheduled Meds: . amLODipine  5 mg Oral Daily  . antiseptic oral rinse  7 mL Mouth Rinse BID  . aspirin EC  81 mg Oral Daily  . azithromycin  500 mg Intravenous Q24H  . carvedilol  12.5 mg Oral BID WC  .  ceFAZolin (ANCEF) IV  2 g Intravenous To OR  . cefTRIAXone (ROCEPHIN)  IV  1 g Intravenous Q24H  . insulin aspart  0-9 Units Subcutaneous Q4H  . losartan  100 mg Oral Daily  . simvastatin  20 mg Oral QHS   Continuous Infusions: . sodium chloride 75 mL/hr at 07/29/14 1752   PRN Meds:.acetaminophen **OR** acetaminophen, ALPRAZolam, HYDROmorphone (DILAUDID) injection, nitroGLYCERIN, ondansetron (ZOFRAN) IV   Antibiotics   Anti-infectives    Start     Dose/Rate Route Frequency Ordered Stop   07/30/14 0800  ceFAZolin (ANCEF) IVPB 2 g/50 mL premix     2 g 100 mL/hr over 30 Minutes Intravenous To Surgery 07/30/14 0746 07/31/14  0800   07/25/14 2245  cefTRIAXone (ROCEPHIN) 1 g in dextrose 5 % 50 mL IVPB     1 g 100 mL/hr over 30 Minutes Intravenous Every 24 hours 07/25/14 2234     07/25/14 2245  azithromycin (ZITHROMAX) 500 mg in dextrose 5 % 250 mL IVPB     500 mg 250 mL/hr over 60 Minutes Intravenous Every 24 hours 07/25/14 2234          Subjective:   Glennie Rodda was seen and examined today.  Mild nausea, had BM this morning. Patient denies dizziness, chest pain, shortness of breath, abdominal pain, N/V, new weakness, numbess, tingling. No acute events overnight.    Objective:   Blood pressure 150/67, pulse 77, temperature 98.3 F (36.8 C), temperature source Oral, resp. rate 16, height 5' 9.5" (1.765 m), weight 83.416 kg (183 lb 14.4 oz), SpO2 97 %.  Wt Readings from Last 3 Encounters:  07/30/14 83.416 kg (183 lb 14.4 oz)  01/24/14 81.375 kg (179 lb 6.4 oz)  05/27/13 81.194 kg  (179 lb)     Intake/Output Summary (Last 24 hours) at 07/30/14 1334 Last data filed at 07/30/14 0600  Gross per 24 hour  Intake 3904.25 ml  Output    550 ml  Net 3354.25 ml    Exam  General: Alert and oriented x 3, NAD  HEENT:  PERRLA, EOMI, Anicteic Sclera, mucous membranes moist.   Neck: Supple, no JVD, no masses  CVS: S1 S2 auscultated, no rubs, murmurs or gallops. Regular rate and rhythm.  Respiratory: Clear to auscultation bilaterally, no wheezing, rales or rhonchi  Abdomen: Soft, nontender, nondistended, + bowel sounds   Ext: no cyanosis clubbing or edema  Neuro: AAOx3, Cr N's II- XII. Strength 5/5 upper and lower extremities bilaterally  Skin: No rashes  Psych: Normal affect and demeanor, alert and oriented x3    Data Review   Micro Results Recent Results (from the past 240 hour(s))  Surgical pcr screen     Status: None   Collection Time: 07/30/14  8:18 AM  Result Value Ref Range Status   MRSA, PCR NEGATIVE NEGATIVE Final   Staphylococcus aureus NEGATIVE NEGATIVE Final    Comment:        The Xpert SA Assay (FDA approved for NASAL specimens in patients over 22 years of age), is one component of a comprehensive surveillance program.  Test performance has been validated by [facility] for patients greater than or equal to 46 year old. It is not intended to diagnose infection nor to guide or monitor treatment.     Radiology Reports Dg Chest 2 View  07/26/2014   CLINICAL DATA:  Pleural effusion.  EXAM: CHEST  2 VIEW  COMPARISON:  02/17/2012  FINDINGS: Patient's nasogastric tube in place, tip coiled back upon itself into the lower esophagus. Status post median sternotomy and CABG. Heart is enlarged. There is mild interstitial edema. There patchy infiltrates within the left upper lobe and left lower lobe. Left pleural effusion. Persistent wedge compression of T10, unchanged. Mild dilatation of small bowel loops, unchanged.  IMPRESSION: 1. Cardiomegaly and  mild edema. 2. Left upper lobe and left lower lobe infiltrates associated with left pleural effusion. 3. Nasogastric tube coiled back upon itself into lower esophagus. 4. The salient findings were discussed with Lordis on 07/26/2014 at 8:04 pm.   Electronically Signed   By: Nolon Nations M.D.   On: 07/26/2014 20:04   Ct Abdomen Pelvis W Contrast  07/25/2014   CLINICAL DATA:  Mid to lower abdominal pain since 12 hours ago. Vomiting.  EXAM: CT ABDOMEN AND PELVIS WITH CONTRAST  TECHNIQUE: Multidetector CT imaging of the abdomen and pelvis was performed using the standard protocol following bolus administration of intravenous contrast.  CONTRAST:  15mL OMNIPAQUE IOHEXOL 300 MG/ML  SOLN  COMPARISON:  None.  FINDINGS: There is a left effusion layering dependently with patchy density in the dependent left lung that could be atelectasis or pneumonia.  The liver shows a pattern raising the possibility of early cirrhosis. There are 2 benign calcifications in the left lobe. There are calcified stones in the gallbladder neck. No stone is seen along the course of the common duct. The spleen is normal. There are bilateral low-density adrenal adenomas. The kidneys are normal. The aorta and its branch vessels show extensive atherosclerotic disease. No aneurysm. Bladder, prostate gland and seminal vesicles are unremarkable.  There are dilated fluid and air-filled loops of small intestine consistent with partial small bowel obstruction. There is a caliber change at the mid ileum. Specific obstructing lesion is not identified. There is free intraperitoneal fluid but no free intraperitoneal air are identified. Old vertebral augmentation at T10. Chronic bilateral pars defects at L5.  IMPRESSION: Partial small bowel obstruction. Level of the obstruction is probably mid ileum. Specific etiology not demonstrated.  Free fluid which could relate to the small bowel obstruction or could be secondary to cirrhosis. The patient does appear  to have cirrhosis of the liver.  Gallstones.  Advanced widespread atherosclerosis.  Left pleural effusion. Atelectasis and/or pneumonia in the dependent left lower lobe.   Electronically Signed   By: Nelson Chimes M.D.   On: 07/25/2014 21:28   Dg Abd 2 Views  07/30/2014   CLINICAL DATA:  Small-bowel obstruction.  EXAM: ABDOMEN - 2 VIEW  COMPARISON:  07/29/2014 .  FINDINGS: NG tube in stable position. Soft tissue structures are unremarkable. Persistent dilated loops of small bowel are again noted consistent with small bowel obstruction. Oral contrast again noted colon. No free air. Prior median sternotomy. Surgical clips left upper quadrant.  IMPRESSION: Persistent unchanged dilated loops of small bowel consistent with small bowel obstruction. Oral contrast is noted in the colon. No free air. NG tube in stable position.   Electronically Signed   By: Marcello Moores  Register   On: 07/30/2014 07:11   Dg Abd 2 Views  07/28/2014   CLINICAL DATA:  Ileus, nausea.  EXAM: ABDOMEN - 2 VIEW  COMPARISON:  07/27/2014  FINDINGS: NG tube is present in the stomach. There are dilated small bowel loops with air-fluid levels. Degree of small bowel dilatation has increased since prior study. Colon is decompressed. No free air organomegaly. No suspicious calcification. Vascular calcifications noted in the aorta and iliac vessels. No acute bony abnormality.  IMPRESSION: Worsening small bowel dilatation compatible with worsening small bowel obstruction.   Electronically Signed   By: Rolm Baptise M.D.   On: 07/28/2014 08:57   Dg Abd Portable 1v  07/29/2014   CLINICAL DATA:  24 hour delay with Gastrografin. Small bowel protocol.  EXAM: PORTABLE ABDOMEN - 1 VIEW  COMPARISON:  07/28/2014  FINDINGS: Persistent gaseous distended loops of small bowel are demonstrated within the central abdomen measuring up to 3.5 cm, compatible with small bowel obstruction. Oral contrast material is demonstrated to the right lower quadrant, within the cecum and  proximal ascending colon. NG tube projects over the left upper quadrant. Left upper quadrant surgical clips. Lower lumbar spine degenerative changes.  IMPRESSION: Oral contrast  material demonstrated within the right lower quadrant, likely within the cecum and ascending colon.  Persistent gaseous distended loops of small bowel, compatible with obstruction.   Electronically Signed   By: Lovey Newcomer M.D.   On: 07/29/2014 13:40   Dg Abd Portable 1v-small Bowel Obstruction Protocol-initial, 8 Hr Delay  07/29/2014   CLINICAL DATA:  Small bowel obstruction  EXAM: PORTABLE ABDOMEN - 1 VIEW  COMPARISON:  07/28/2014  FINDINGS: The nasogastric tube extends into the proximal stomach with the side-port in the region of the EG junction.  There is persistent dilatation and stacking of small bowel loops consistent with small bowel obstruction. No free air is evident. No interval change is evident.  IMPRESSION: Persistent small bowel obstruction.   Electronically Signed   By: Andreas Newport M.D.   On: 07/29/2014 02:27   Dg Abd Portable 1v  07/27/2014   CLINICAL DATA:  Small-bowel obstruction.  EXAM: PORTABLE ABDOMEN - 1 VIEW  COMPARISON:  07/25/2014  FINDINGS: Nasogastric tube is partially imaged, tip overlying the level of the stomach. There are persistent dilated small bowel loops in the central abdomen. Nondilated loops of large bowel persist. There is a small amount of residual contrast in the bladder.  IMPRESSION: 1. Persistent small bowel dilatation. 2. Residual contrast in the bladder.   Electronically Signed   By: Nolon Nations M.D.   On: 07/27/2014 09:10   Dg Abd Portable 1v  07/25/2014   CLINICAL DATA:  Abdominal pain.  Nausea and vomiting for 1 day.  EXAM: PORTABLE ABDOMEN - 1 VIEW  COMPARISON:  CT of earlier in the day  FINDINGS: Single supine portable view. Contrast within the urinary bladder. Gastric distension with possible nasogastric tube, incompletely imaged. Proximal small bowel loops measure up to  3.2 cm. Gas and stool within the colon. Advanced atherosclerosis.  IMPRESSION: Partial small bowel obstruction, without free intraperitoneal air or other acute complication.   Electronically Signed   By: Abigail Miyamoto M.D.   On: 07/25/2014 23:46    CBC  Recent Labs Lab 07/25/14 1828 07/26/14 0411 07/27/14 0550 07/28/14 0400  WBC 9.5 12.3* 9.3 7.8  HGB 11.0* 10.6* 8.9* 9.0*  HCT 30.5* 30.6* 25.5* 25.8*  PLT 175 152 152 148*  MCV 86.6 89.0 88.2 89.9  MCH 31.3 30.8 30.8 31.4  MCHC 36.1* 34.6 34.9 34.9  RDW 14.8 15.0 15.1 15.4  LYMPHSABS 0.7  --   --  0.9  MONOABS 0.8  --   --  0.9  EOSABS 0.0  --   --  0.1  BASOSABS 0.0  --   --  0.0    Chemistries   Recent Labs Lab 07/25/14 1828 07/26/14 0411 07/27/14 0550 07/28/14 0400 07/29/14 1450 07/29/14 1650 07/30/14 0420  NA 132* 130* 132* 133*  --  134* 132*  K 3.3* 3.9 3.9 3.7  --  3.2* 3.3*  CL 97 96 100* 101  --  100* 99*  CO2 22 19 22 23   --  22 22  GLUCOSE 89 242* 166* 171*  --  132* 150*  BUN 18 23 33* 22*  --  19 19  CREATININE 1.06 1.06 1.18 0.86  --  0.77 0.74  CALCIUM 9.7 8.9 8.9 8.7*  --  9.0 8.6*  MG  --   --   --   --  1.9  --   --   AST 27 26  --   --   --   --   --  ALT 18 16  --   --   --   --   --   ALKPHOS 109 106  --   --   --   --   --   BILITOT 1.6* 1.1  --   --   --   --   --    ------------------------------------------------------------------------------------------------------------------ estimated creatinine clearance is 83.6 mL/min (by C-G formula based on Cr of 0.74). ------------------------------------------------------------------------------------------------------------------ No results for input(s): HGBA1C in the last 72 hours. ------------------------------------------------------------------------------------------------------------------ No results for input(s): CHOL, HDL, LDLCALC, TRIG, CHOLHDL, LDLDIRECT in the last 72  hours. ------------------------------------------------------------------------------------------------------------------ No results for input(s): TSH, T4TOTAL, T3FREE, THYROIDAB in the last 72 hours.  Invalid input(s): FREET3 ------------------------------------------------------------------------------------------------------------------ No results for input(s): VITAMINB12, FOLATE, FERRITIN, TIBC, IRON, RETICCTPCT in the last 72 hours.  Coagulation profile No results for input(s): INR, PROTIME in the last 168 hours.  No results for input(s): DDIMER in the last 72 hours.  Cardiac Enzymes No results for input(s): CKMB, TROPONINI, MYOGLOBIN in the last 168 hours.  Invalid input(s): CK ------------------------------------------------------------------------------------------------------------------ Invalid input(s): [place]  07/29/14 1953 07/30/14 0025 07/30/14 0356 07/30/14 0754 07/30/14 1023 07/30/14 1208  GLUCAP 122* 132* 139* 124* 131* 164*     Laddie Math M.D. [facility] 07/30/2014, 1:34 PM  Pager: [phone]   Between 7am to 7pm - call Pager - [phone]  After 7pm go to [email or web address] - password [email or web address]  Call night coverage person covering after 7pm

## 2014-07-30 NOTE — Progress Notes (Addendum)
Subjective: Had nausea this morning with NG Not passing any flatus  Objective: Vital signs in last 24 hours: Temp:  [98.3 F (36.8 C)-99 F (37.2 C)] 98.3 F (36.8 C) 09-Aug-2022 0556) Pulse Rate:  [72-77] 77 08-09-2022 0556) Resp:  [16-17] 16 08-09-22 0556) BP: (145-150)/(58-67) 150/67 mmHg 08-09-2022 0556) SpO2:  [94 %-97 %] 97 % 2022/08/09 0556) Weight:  [83.416 kg (183 lb 14.4 oz)] 83.416 kg (183 lb 14.4 oz) 2022/08/09 0556) Last BM Date: 07/28/14  Intake/Output from previous day: 05/03 0701 - 08/09/22 0700 In: 3904.3 [P.O.:50; I.V.:2654.3; IV Piggyback:1200] Out: 550 [Urine:200; Emesis/NG output:350] Intake/Output this shift:    Abdomen full, almost non tender, hypoactive BS  Lab Results:   Recent Labs  07/28/14 0400  WBC 7.8  HGB 9.0*  HCT 25.8*  PLT 148*   BMET  Recent Labs  07/29/14 1650 2014-08-09 0420  NA 134* 132*  K 3.2* 3.3*  CL 100* 99*  CO2 22 22  GLUCOSE 132* 150*  BUN 19 19  CREATININE 0.77 0.74  CALCIUM 9.0 8.6*   PT/INR No results for input(s): LABPROT, INR in the last 72 hours. ABG No results for input(s): PHART, HCO3 in the last 72 hours.  Invalid input(s): PCO2, PO2  Studies/Results: Dg Abd 2 Views  2014-08-09   CLINICAL DATA:  Small-bowel obstruction.  EXAM: ABDOMEN - 2 VIEW  COMPARISON:  07/29/2014 .  FINDINGS: NG tube in stable position. Soft tissue structures are unremarkable. Persistent dilated loops of small bowel are again noted consistent with small bowel obstruction. Oral contrast again noted colon. No free air. Prior median sternotomy. Surgical clips left upper quadrant.  IMPRESSION: Persistent unchanged dilated loops of small bowel consistent with small bowel obstruction. Oral contrast is noted in the colon. No free air. NG tube in stable position.   Electronically Signed   By: Marcello Moores  Register   On: 2014/08/09 07:11   Dg Abd 2 Views  07/28/2014   CLINICAL DATA:  Ileus, nausea.  EXAM: ABDOMEN - 2 VIEW  COMPARISON:  07/27/2014  FINDINGS: NG tube is  present in the stomach. There are dilated small bowel loops with air-fluid levels. Degree of small bowel dilatation has increased since prior study. Colon is decompressed. No free air organomegaly. No suspicious calcification. Vascular calcifications noted in the aorta and iliac vessels. No acute bony abnormality.  IMPRESSION: Worsening small bowel dilatation compatible with worsening small bowel obstruction.   Electronically Signed   By: Rolm Baptise M.D.   On: 07/28/2014 08:57   Dg Abd Portable 1v  07/29/2014   CLINICAL DATA:  24 hour delay with Gastrografin. Small bowel protocol.  EXAM: PORTABLE ABDOMEN - 1 VIEW  COMPARISON:  07/28/2014  FINDINGS: Persistent gaseous distended loops of small bowel are demonstrated within the central abdomen measuring up to 3.5 cm, compatible with small bowel obstruction. Oral contrast material is demonstrated to the right lower quadrant, within the cecum and proximal ascending colon. NG tube projects over the left upper quadrant. Left upper quadrant surgical clips. Lower lumbar spine degenerative changes.  IMPRESSION: Oral contrast material demonstrated within the right lower quadrant, likely within the cecum and ascending colon.  Persistent gaseous distended loops of small bowel, compatible with obstruction.   Electronically Signed   By: Lovey Newcomer M.D.   On: 07/29/2014 13:40   Dg Abd Portable 1v-small Bowel Obstruction Protocol-initial, 8 Hr Delay  07/29/2014   CLINICAL DATA:  Small bowel obstruction  EXAM: PORTABLE ABDOMEN - 1 VIEW  COMPARISON:  07/28/2014  FINDINGS: The nasogastric tube extends into the proximal stomach with the side-port in the region of the EG junction.  There is persistent dilatation and stacking of small bowel loops consistent with small bowel obstruction. No free air is evident. No interval change is evident.  IMPRESSION: Persistent small bowel obstruction.   Electronically Signed   By: Andreas Newport M.D.   On: 07/29/2014 02:27     Anti-infectives: Anti-infectives    Start     Dose/Rate Route Frequency Ordered Stop   07/25/14 2245  cefTRIAXone (ROCEPHIN) 1 g in dextrose 5 % 50 mL IVPB     1 g 100 mL/hr over 30 Minutes Intravenous Every 24 hours 07/25/14 2234     07/25/14 2245  azithromycin (ZITHROMAX) 500 mg in dextrose 5 % 250 mL IVPB     500 mg 250 mL/hr over 60 Minutes Intravenous Every 24 hours 07/25/14 2234        Assessment/Plan: s/p * No surgery found *  Persistent SBO, not improving  I recommend exploratory laparotomy.  I discussed this with the patient and his family.   I discussed the risks which include but are not limited to bleeding, infection, injury to surrounding structures, need for bowel resection, need for further surgery, cardiopulmonary problems, DVT, etc.  He agrees to proceed.  LOS: 5 days    BLACKMAN,DOUGLAS A 07/30/2014  ADDENDUM: 10:50am Patient has had 2 BMs this morning.  He feels much better right now.  He prefers to hold off on surgery.  His abdomen is less bloated than it has been.  I encouraged him to continue to mobilize and we would follow him.  This does not mean he still may require surgical intervention, but we will hold off for today.    Cheryl Stabenow E 10:50 AM 07/30/2014

## 2014-07-31 LAB — BASIC METABOLIC PANEL
ANION GAP: 11 (ref 5–15)
BUN: 18 mg/dL (ref 6–20)
CALCIUM: 8.8 mg/dL — AB (ref 8.9–10.3)
CHLORIDE: 100 mmol/L — AB (ref 101–111)
CO2: 21 mmol/L — ABNORMAL LOW (ref 22–32)
CREATININE: 0.8 mg/dL (ref 0.61–1.24)
GFR calc Af Amer: >60 mL/min (ref >60)
GFR calc non Af Amer: >60 mL/min (ref >60)
Glucose, Bld: 162 mg/dL — ABNORMAL HIGH (ref 70–99)
POTASSIUM: 3.3 mmol/L — AB (ref 3.5–5.1)
SODIUM: 132 mmol/L — AB (ref 135–145)

## 2014-07-31 LAB — GLUCOSE, CAPILLARY
GLUCOSE-CAPILLARY: 149 mg/dL — AB (ref 70–99)
Glucose-Capillary: 145 mg/dL — ABNORMAL HIGH (ref 70–99)
Glucose-Capillary: 148 mg/dL — ABNORMAL HIGH (ref 70–99)
Glucose-Capillary: 158 mg/dL — ABNORMAL HIGH (ref 70–99)
Glucose-Capillary: 159 mg/dL — ABNORMAL HIGH (ref 70–99)
Glucose-Capillary: 163 mg/dL — ABNORMAL HIGH (ref 70–99)
Glucose-Capillary: 235 mg/dL — ABNORMAL HIGH (ref 70–99)

## 2014-07-31 MED ORDER — POTASSIUM CHLORIDE CRYS ER 20 MEQ PO TBCR
40.0000 meq | EXTENDED_RELEASE_TABLET | Freq: Once | ORAL | Status: AC
  Administered 2014-07-31: 40 meq via ORAL
  Filled 2014-07-31: qty 2

## 2014-07-31 NOTE — Progress Notes (Signed)
[facility]                                                                              Patient Demographics  [name], is a 74 y.o. male, DOB - Dec 18, 1940, OZD:[id number]  Admit date - 07/25/2014   Admitting Physician Jani Gravel, MD  Outpatient Primary MD for the patient is  Melinda Crutch, MD  LOS - 6   Chief Complaint  Patient presents with  . Abdominal Pain       Brief HPI   Patient is a 74 year old male with CAD, diabetes type 2, hypertension, hyperlipidemia presented with abdominal pain and described as periumbilical, sharp with nausea and vomiting. He denied any fevers or chills, diarrhea or melena or hematochezia. Patient underwent CT of the abdomen and pelvis which showed partial small bowel traction. Patient was admitted for further workup.  Assessment & Plan    Principal Problem:   SBO (small bowel obstruction)- appears to be improving - Per patient had 5 BMs yesterday, NG tube was discontinued, surgery was placed on hold - Gen. surgery following, clear liquids started today   Active Problems:   CAD (coronary artery disease): - Currently stable, no chest pain or shortness of breath    Diabetes mellitus - Hemoglobin A1c 6.4, continue sliding scale insulin    HTN (hypertension) - Well-controlled, continue Norvasc, Coreg  Questionable left pleural effusion on CT abdomen, CAP on CXR.  - No coughing or respiratory symptoms, afebrile. Chest x-ray showed patchy infiltrates in the upper lobe and left lower lobe, continue IV antibiotics for now, repeat chest x-ray in 4-6 weeks to ensure complete resolution of pneumonia.  Normocytic anemia - Currently stable, recheck CBC  Hypokalemia - ReplaceD  Code Status:Full code   Family Communication: Discussed in detail with the patient, all imaging results, lab results explained to the patientand and wife at bedside  Disposition Plan: not medically ready  Time Spent in minutes  25 minutes  Procedures   abd Xray CT abd  Consults   Surgery  DVT Prophylaxis  SCD's  Medications  Scheduled Meds: . amLODipine  5 mg Oral Daily  . antiseptic oral rinse  7 mL Mouth Rinse BID  . aspirin EC  81 mg Oral Daily  . azithromycin  500 mg Intravenous Q24H  . carvedilol  12.5 mg Oral BID WC  . cefTRIAXone (ROCEPHIN)  IV  1 g Intravenous Q24H  . insulin aspart  0-9 Units Subcutaneous Q4H  . losartan  100 mg Oral Daily  . simvastatin  20 mg Oral QHS   Continuous Infusions: . sodium chloride 75 mL/hr at 07/31/14 0544   PRN Meds:.acetaminophen **OR** acetaminophen, ALPRAZolam, HYDROmorphone (DILAUDID) injection, nitroGLYCERIN, ondansetron (ZOFRAN) IV   Antibiotics   Anti-infectives    Start     Dose/Rate Route Frequency Ordered Stop   07/30/14 0800  ceFAZolin (ANCEF) IVPB 2 g/50 mL premix     2 g 100 mL/hr over 30 Minutes Intravenous To Surgery 07/30/14 0746 07/31/14 0800   07/25/14 2245  cefTRIAXone (ROCEPHIN) 1 g in dextrose 5 % 50 mL IVPB     1 g 100 mL/hr over 30  Minutes Intravenous Every 24 hours 07/25/14 2234     07/25/14 2245  azithromycin (ZITHROMAX) 500 mg in dextrose 5 % 250 mL IVPB     500 mg 250 mL/hr over 60 Minutes Intravenous Every 24 hours 07/25/14 2234          Subjective:   [name] was seen and examined today. Feels a lot better today, NG tube out, patient reports 5 BMs yesterday and one BM this morning.  patient denies dizziness, chest pain, shortness of breath, abdominal pain, N/V, new weakness, numbess, tingling. No acute events overnight.    Objective:   Blood pressure 159/63, pulse 75, temperature 98.2 F (36.8 C), temperature source Oral, resp. rate 17, height 5' 9.5" (1.765 m), weight 83.416 kg (183 lb 14.4 oz), SpO2 93 %.  Wt Readings from Last 3 Encounters:  07/30/14 83.416 kg (183 lb 14.4 oz)  01/24/14 81.375 kg (179 lb 6.4 oz)  05/27/13 81.194 kg (179 lb)     Intake/Output Summary (Last 24 hours) at 07/31/14 1343 Last data filed at 07/31/14  0900  Gross per 24 hour  Intake   2390 ml  Output    450 ml  Net   1940 ml    Exam  General: Alert and oriented x 3, NAD  HEENT:  PERRLA, EOMI  Neck: Supple, no JVD, no masses  CVS: S1 S2 clear, no mrg  Respiratory: CTAB  Abdomen: Soft, nontender, nondistended, + bowel sounds   Ext: no c/c/e b/l  Neuro: AAOx3, Cr N's II- XII. Strength 5/5 upper and lower extremities bilaterally  Skin: No rashes  Psych: Normal affect and demeanor, alert and oriented x3    Data Review   Micro Results Recent Results (from the past 240 hour(s))  Surgical pcr screen     Status: None   Collection Time: 07/30/14  8:18 AM  Result Value Ref Range Status   MRSA, PCR NEGATIVE NEGATIVE Final   Staphylococcus aureus NEGATIVE NEGATIVE Final    Comment:        The Xpert SA Assay (FDA approved for NASAL specimens in patients over 63 years of age), is one component of a comprehensive surveillance program.  Test performance has been validated by [facility] for patients greater than or equal to 39 year old. It is not intended to diagnose infection nor to guide or monitor treatment.     Radiology Reports Dg Chest 2 View  07/26/2014   CLINICAL DATA:  Pleural effusion.  EXAM: CHEST  2 VIEW  COMPARISON:  02/17/2012  FINDINGS: Patient's nasogastric tube in place, tip coiled back upon itself into the lower esophagus. Status post median sternotomy and CABG. Heart is enlarged. There is mild interstitial edema. There patchy infiltrates within the left upper lobe and left lower lobe. Left pleural effusion. Persistent wedge compression of T10, unchanged. Mild dilatation of small bowel loops, unchanged.  IMPRESSION: 1. Cardiomegaly and mild edema. 2. Left upper lobe and left lower lobe infiltrates associated with left pleural effusion. 3. Nasogastric tube coiled back upon itself into lower esophagus. 4. The salient findings were discussed with Lordis on 07/26/2014 at 8:04 pm.   Electronically Signed   By:  Nolon Nations M.D.   On: 07/26/2014 20:04   Ct Abdomen Pelvis W Contrast  07/25/2014   CLINICAL DATA:  Mid to lower abdominal pain since 12 hours ago. Vomiting.  EXAM: CT ABDOMEN AND PELVIS WITH CONTRAST  TECHNIQUE: Multidetector CT imaging of the abdomen and pelvis was performed using the standard  protocol following bolus administration of intravenous contrast.  CONTRAST:  159mL OMNIPAQUE IOHEXOL 300 MG/ML  SOLN  COMPARISON:  None.  FINDINGS: There is a left effusion layering dependently with patchy density in the dependent left lung that could be atelectasis or pneumonia.  The liver shows a pattern raising the possibility of early cirrhosis. There are 2 benign calcifications in the left lobe. There are calcified stones in the gallbladder neck. No stone is seen along the course of the common duct. The spleen is normal. There are bilateral low-density adrenal adenomas. The kidneys are normal. The aorta and its branch vessels show extensive atherosclerotic disease. No aneurysm. Bladder, prostate gland and seminal vesicles are unremarkable.  There are dilated fluid and air-filled loops of small intestine consistent with partial small bowel obstruction. There is a caliber change at the mid ileum. Specific obstructing lesion is not identified. There is free intraperitoneal fluid but no free intraperitoneal air are identified. Old vertebral augmentation at T10. Chronic bilateral pars defects at L5.  IMPRESSION: Partial small bowel obstruction. Level of the obstruction is probably mid ileum. Specific etiology not demonstrated.  Free fluid which could relate to the small bowel obstruction or could be secondary to cirrhosis. The patient does appear to have cirrhosis of the liver.  Gallstones.  Advanced widespread atherosclerosis.  Left pleural effusion. Atelectasis and/or pneumonia in the dependent left lower lobe.   Electronically Signed   By: Nelson Chimes M.D.   On: 07/25/2014 21:28   Dg Abd 2 Views  07/30/2014    CLINICAL DATA:  Small-bowel obstruction.  EXAM: ABDOMEN - 2 VIEW  COMPARISON:  07/29/2014 .  FINDINGS: NG tube in stable position. Soft tissue structures are unremarkable. Persistent dilated loops of small bowel are again noted consistent with small bowel obstruction. Oral contrast again noted colon. No free air. Prior median sternotomy. Surgical clips left upper quadrant.  IMPRESSION: Persistent unchanged dilated loops of small bowel consistent with small bowel obstruction. Oral contrast is noted in the colon. No free air. NG tube in stable position.   Electronically Signed   By: Marcello Moores  Register   On: 07/30/2014 07:11   Dg Abd 2 Views  07/28/2014   CLINICAL DATA:  Ileus, nausea.  EXAM: ABDOMEN - 2 VIEW  COMPARISON:  07/27/2014  FINDINGS: NG tube is present in the stomach. There are dilated small bowel loops with air-fluid levels. Degree of small bowel dilatation has increased since prior study. Colon is decompressed. No free air organomegaly. No suspicious calcification. Vascular calcifications noted in the aorta and iliac vessels. No acute bony abnormality.  IMPRESSION: Worsening small bowel dilatation compatible with worsening small bowel obstruction.   Electronically Signed   By: Rolm Baptise M.D.   On: 07/28/2014 08:57   Dg Abd Portable 1v  07/29/2014   CLINICAL DATA:  24 hour delay with Gastrografin. Small bowel protocol.  EXAM: PORTABLE ABDOMEN - 1 VIEW  COMPARISON:  07/28/2014  FINDINGS: Persistent gaseous distended loops of small bowel are demonstrated within the central abdomen measuring up to 3.5 cm, compatible with small bowel obstruction. Oral contrast material is demonstrated to the right lower quadrant, within the cecum and proximal ascending colon. NG tube projects over the left upper quadrant. Left upper quadrant surgical clips. Lower lumbar spine degenerative changes.  IMPRESSION: Oral contrast material demonstrated within the right lower quadrant, likely within the cecum and ascending colon.   Persistent gaseous distended loops of small bowel, compatible with obstruction.   Electronically Signed   By: Dian Situ  Rosana Hoes M.D.   On: 07/29/2014 13:40   Dg Abd Portable 1v-small Bowel Obstruction Protocol-initial, 8 Hr Delay  07/29/2014   CLINICAL DATA:  Small bowel obstruction  EXAM: PORTABLE ABDOMEN - 1 VIEW  COMPARISON:  07/28/2014  FINDINGS: The nasogastric tube extends into the proximal stomach with the side-port in the region of the EG junction.  There is persistent dilatation and stacking of small bowel loops consistent with small bowel obstruction. No free air is evident. No interval change is evident.  IMPRESSION: Persistent small bowel obstruction.   Electronically Signed   By: Andreas Newport M.D.   On: 07/29/2014 02:27   Dg Abd Portable 1v  07/27/2014   CLINICAL DATA:  Small-bowel obstruction.  EXAM: PORTABLE ABDOMEN - 1 VIEW  COMPARISON:  07/25/2014  FINDINGS: Nasogastric tube is partially imaged, tip overlying the level of the stomach. There are persistent dilated small bowel loops in the central abdomen. Nondilated loops of large bowel persist. There is a small amount of residual contrast in the bladder.  IMPRESSION: 1. Persistent small bowel dilatation. 2. Residual contrast in the bladder.   Electronically Signed   By: Nolon Nations M.D.   On: 07/27/2014 09:10   Dg Abd Portable 1v  07/25/2014   CLINICAL DATA:  Abdominal pain.  Nausea and vomiting for 1 day.  EXAM: PORTABLE ABDOMEN - 1 VIEW  COMPARISON:  CT of earlier in the day  FINDINGS: Single supine portable view. Contrast within the urinary bladder. Gastric distension with possible nasogastric tube, incompletely imaged. Proximal small bowel loops measure up to 3.2 cm. Gas and stool within the colon. Advanced atherosclerosis.  IMPRESSION: Partial small bowel obstruction, without free intraperitoneal air or other acute complication.   Electronically Signed   By: Abigail Miyamoto M.D.   On: 07/25/2014 23:46    CBC  Recent Labs Lab  07/25/14 1828 07/26/14 0411 07/27/14 0550 07/28/14 0400  WBC 9.5 12.3* 9.3 7.8  HGB 11.0* 10.6* 8.9* 9.0*  HCT 30.5* 30.6* 25.5* 25.8*  PLT 175 152 152 148*  MCV 86.6 89.0 88.2 89.9  MCH 31.3 30.8 30.8 31.4  MCHC 36.1* 34.6 34.9 34.9  RDW 14.8 15.0 15.1 15.4  LYMPHSABS 0.7  --   --  0.9  MONOABS 0.8  --   --  0.9  EOSABS 0.0  --   --  0.1  BASOSABS 0.0  --   --  0.0    Chemistries   Recent Labs Lab 07/25/14 1828 07/26/14 0411 07/27/14 0550 07/28/14 0400 07/29/14 1450 07/29/14 1650 07/30/14 0420 07/31/14 0353  NA 132* 130* 132* 133*  --  134* 132* 132*  K 3.3* 3.9 3.9 3.7  --  3.2* 3.3* 3.3*  CL 97 96 100* 101  --  100* 99* 100*  CO2 22 19 22 23   --  22 22 21*  GLUCOSE 89 242* 166* 171*  --  132* 150* 162*  BUN 18 23 33* 22*  --  19 19 18   CREATININE 1.06 1.06 1.18 0.86  --  0.77 0.74 0.80  CALCIUM 9.7 8.9 8.9 8.7*  --  9.0 8.6* 8.8*  MG  --   --   --   --  1.9  --   --   --   AST 27 26  --   --   --   --   --   --   ALT 18 16  --   --   --   --   --   --  ALKPHOS 109 106  --   --   --   --   --   --   BILITOT 1.6* 1.1  --   --   --   --   --   --    ------------------------------------------------------------------------------------------------------------------ estimated creatinine clearance is 83.6 mL/min (by C-G formula based on Cr of 0.8). ------------------------------------------------------------------------------------------------------------------ No results for input(s): HGBA1C in the last 72 hours. ------------------------------------------------------------------------------------------------------------------ No results for input(s): CHOL, HDL, LDLCALC, TRIG, CHOLHDL, LDLDIRECT in the last 72 hours. ------------------------------------------------------------------------------------------------------------------ No results for input(s): TSH, T4TOTAL, T3FREE, THYROIDAB in the last 72 hours.  Invalid input(s):  FREET3 ------------------------------------------------------------------------------------------------------------------ No results for input(s): VITAMINB12, FOLATE, FERRITIN, TIBC, IRON, RETICCTPCT in the last 72 hours.  Coagulation profile No results for input(s): INR, PROTIME in the last 168 hours.  No results for input(s): DDIMER in the last 72 hours.  Cardiac Enzymes No results for input(s): CKMB, TROPONINI, MYOGLOBIN in the last 168 hours.  Invalid input(s): CK ------------------------------------------------------------------------------------------------------------------ Invalid input(s): [place]  07/30/14 1712 07/30/14 2027 07/31/14 0031 07/31/14 0413 07/31/14 0738 07/31/14 1247  GLUCAP 159* 160* 149* 158* 148* 235*     Danahi Reddish M.D. [facility] 07/31/2014, 1:43 PM  Pager: [phone]   Between 7am to 7pm - call Pager - [phone]  After 7pm go to [email or web address] - password [email or web address]  Call night coverage person covering after 7pm

## 2014-07-31 NOTE — Progress Notes (Signed)
Patient ID: [name], male   DOB: [birth year], 74 y.o.   MRN: [id number]    Subjective: Pt feels well today.  Had 5 BMs yesterday.  No nausea with NGT out.    Objective: Vital signs in last 24 hours: Temp:  [98.2 F (36.8 C)-99.1 F (37.3 C)] 98.2 F (36.8 C) (05/05 0555) Pulse Rate:  [70-75] 75 (05/05 0555) Resp:  [17-18] 17 (05/05 0555) BP: (148-159)/(63-67) 159/63 mmHg (05/05 0555) SpO2:  [93 %-98 %] 93 % (05/05 0555) Last BM Date: 07-Aug-2014  Intake/Output from previous day: 08/07/2022 0701 - 05/05 0700 In: 1770 [P.O.:100; I.V.:1370; IV Piggyback:300] Out: 450 [Urine:450] Intake/Output this shift:    PE: Abd: soft, NT, ND, +BS Heart: regular  Lab Results:  No results for input(s): WBC, HGB, HCT, PLT in the last 72 hours. BMET  Recent Labs  08/07/14 0420 07/31/14 0353  NA 132* 132*  K 3.3* 3.3*  CL 99* 100*  CO2 22 21*  GLUCOSE 150* 162*  BUN 19 18  CREATININE 0.74 0.80  CALCIUM 8.6* 8.8*   PT/INR No results for input(s): LABPROT, INR in the last 72 hours. CMP     Component Value Date/Time   NA 132* 07/31/2014 0353   K 3.3* 07/31/2014 0353   CL 100* 07/31/2014 0353   CO2 21* 07/31/2014 0353   GLUCOSE 162* 07/31/2014 0353   BUN 18 07/31/2014 0353   CREATININE 0.80 07/31/2014 0353   CALCIUM 8.8* 07/31/2014 0353   PROT 6.9 07/26/2014 0411   ALBUMIN 3.7 07/26/2014 0411   AST 26 07/26/2014 0411   ALT 16 07/26/2014 0411   ALKPHOS 106 07/26/2014 0411   BILITOT 1.1 07/26/2014 0411   GFRNONAA >60 07/31/2014 0353   GFRAA >60 07/31/2014 0353   Lipase     Component Value Date/Time   LIPASE 31 07/25/2014 1828       Studies/Results: Dg Abd 2 Views  Aug 07, 2014   CLINICAL DATA:  Small-bowel obstruction.  EXAM: ABDOMEN - 2 VIEW  COMPARISON:  07/29/2014 .  FINDINGS: NG tube in stable position. Soft tissue structures are unremarkable. Persistent dilated loops of small bowel are again noted consistent with small bowel obstruction. Oral contrast again noted  colon. No free air. Prior median sternotomy. Surgical clips left upper quadrant.  IMPRESSION: Persistent unchanged dilated loops of small bowel consistent with small bowel obstruction. Oral contrast is noted in the colon. No free air. NG tube in stable position.   Electronically Signed   By: Marcello Moores  Register   On: 08/07/2014 07:11   Dg Abd Portable 1v  07/29/2014   CLINICAL DATA:  24 hour delay with Gastrografin. Small bowel protocol.  EXAM: PORTABLE ABDOMEN - 1 VIEW  COMPARISON:  07/28/2014  FINDINGS: Persistent gaseous distended loops of small bowel are demonstrated within the central abdomen measuring up to 3.5 cm, compatible with small bowel obstruction. Oral contrast material is demonstrated to the right lower quadrant, within the cecum and proximal ascending colon. NG tube projects over the left upper quadrant. Left upper quadrant surgical clips. Lower lumbar spine degenerative changes.  IMPRESSION: Oral contrast material demonstrated within the right lower quadrant, likely within the cecum and ascending colon.  Persistent gaseous distended loops of small bowel, compatible with obstruction.   Electronically Signed   By: Lovey Newcomer M.D.   On: 07/29/2014 13:40    Anti-infectives: Anti-infectives    Start     Dose/Rate Route Frequency Ordered Stop   Aug 07, 2014 0800  ceFAZolin (ANCEF) IVPB 2 g/50  mL premix     2 g 100 mL/hr over 30 Minutes Intravenous To Surgery 07/30/14 0746 07/31/14 0800   07/25/14 2245  cefTRIAXone (ROCEPHIN) 1 g in dextrose 5 % 50 mL IVPB     1 g 100 mL/hr over 30 Minutes Intravenous Every 24 hours 07/25/14 2234     07/25/14 2245  azithromycin (ZITHROMAX) 500 mg in dextrose 5 % 250 mL IVPB     500 mg 250 mL/hr over 60 Minutes Intravenous Every 24 hours 07/25/14 2234         Assessment/Plan  1. SBO -will give clear liquids today -if he fails diet advancement, he may require surgery, but right now he seems to be improving    LOS: 6 days    Avonelle Viveros E 07/31/2014,  8:26 AM Pager: [phone]

## 2014-08-01 ENCOUNTER — Inpatient Hospital Stay (HOSPITAL_COMMUNITY): Payer: Medicare Other

## 2014-08-01 ENCOUNTER — Ambulatory Visit: Payer: Self-pay | Admitting: Cardiology

## 2014-08-01 LAB — BASIC METABOLIC PANEL
Anion gap: 11 (ref 5–15)
BUN: 18 mg/dL (ref 6–20)
CO2: 22 mmol/L (ref 22–32)
CREATININE: 0.82 mg/dL (ref 0.61–1.24)
Calcium: 9 mg/dL (ref 8.9–10.3)
Chloride: 101 mmol/L (ref 101–111)
GFR calc Af Amer: >60 mL/min (ref >60)
GFR calc non Af Amer: >60 mL/min (ref >60)
Glucose, Bld: 141 mg/dL — ABNORMAL HIGH (ref 70–99)
Potassium: 4 mmol/L (ref 3.5–5.1)
Sodium: 134 mmol/L — ABNORMAL LOW (ref 135–145)

## 2014-08-01 LAB — GLUCOSE, CAPILLARY
GLUCOSE-CAPILLARY: 148 mg/dL — AB (ref 70–99)
GLUCOSE-CAPILLARY: 119 mg/dL — AB (ref 70–99)
Glucose-Capillary: 154 mg/dL — ABNORMAL HIGH (ref 70–99)
Glucose-Capillary: 168 mg/dL — ABNORMAL HIGH (ref 70–99)
Glucose-Capillary: 205 mg/dL — ABNORMAL HIGH (ref 70–99)

## 2014-08-01 LAB — CREATININE, SERUM
Creatinine, Ser: 0.8 mg/dL (ref 0.61–1.24)
GFR calc Af Amer: >60 mL/min (ref >60)

## 2014-08-01 NOTE — Progress Notes (Signed)
[facility]                                                                              Patient Demographics  [name], is a 74 y.o. male, DOB - 06/30/1940, AYT:[id number]  Admit date - 07/25/2014   Admitting Physician Jani Gravel, MD  Outpatient Primary MD for the patient is  Melinda Crutch, MD  LOS - 7   Chief Complaint  Patient presents with  . Abdominal Pain       Brief HPI   Patient is a 74 year old male with CAD, diabetes type 2, hypertension, hyperlipidemia presented with abdominal pain and described as periumbilical, sharp with nausea and vomiting. He denied any fevers or chills, diarrhea or melena or hematochezia. Patient underwent CT of the abdomen and pelvis which showed partial small bowel traction. Patient was admitted for further workup.  Assessment & Plan    Principal Problem:   SBO (small bowel obstruction)-  - 3 BMs this morning, however having nausea with clear liquids, general surgery following, recommended continue on clears  Active Problems:   CAD (coronary artery disease): - Currently stable, no chest pain or shortness of breath    Diabetes mellitus - Hemoglobin A1c 6.4, continue sliding scale insulin    HTN (hypertension) - Well-controlled, continue Norvasc, Coreg  Questionable left pleural effusion on CT abdomen, CAP on CXR.  - No coughing or respiratory symptoms, afebrile. Chest x-ray showed patchy infiltrates in the upper lobe and left lower lobe, continue IV antibiotics for now, repeat chest x-ray in 4-6 weeks to ensure complete resolution of pneumonia.  Normocytic anemia - Currently stable  Code Status:Full code   Family Communication: Discussed in detail with the patient, all imaging results, lab results explained to the patient and and wife at bedside  Disposition Plan: not medically ready  Time Spent in minutes  25 minutes  Procedures  abd Xray CT abd  Consults   Surgery  DVT Prophylaxis   SCD's  Medications  Scheduled Meds: . amLODipine  5 mg Oral Daily  . aspirin EC  81 mg Oral Daily  . azithromycin  500 mg Intravenous Q24H  . carvedilol  12.5 mg Oral BID WC  . cefTRIAXone (ROCEPHIN)  IV  1 g Intravenous Q24H  . insulin aspart  0-9 Units Subcutaneous Q4H  . losartan  100 mg Oral Daily  . simvastatin  20 mg Oral QHS   Continuous Infusions: . sodium chloride 75 mL/hr at 08/01/14 0808   PRN Meds:.acetaminophen **OR** acetaminophen, ALPRAZolam, HYDROmorphone (DILAUDID) injection, nitroGLYCERIN, ondansetron (ZOFRAN) IV   Antibiotics   Anti-infectives    Start     Dose/Rate Route Frequency Ordered Stop   07/30/14 0800  ceFAZolin (ANCEF) IVPB 2 g/50 mL premix     2 g 100 mL/hr over 30 Minutes Intravenous To Surgery 07/30/14 0746 07/31/14 0800   07/25/14 2245  cefTRIAXone (ROCEPHIN) 1 g in dextrose 5 % 50 mL IVPB     1 g 100 mL/hr over 30 Minutes Intravenous Every 24 hours 07/25/14 2234     07/25/14 2245  azithromycin (ZITHROMAX) 500 mg in dextrose 5 % 250 mL IVPB  500 mg 250 mL/hr over 60 Minutes Intravenous Every 24 hours 07/25/14 2234          Subjective:   [name] was seen and examined today. NG tube out, was placed on clear liquid diet, still having nausea, no vomiting, 3 BMs today. Denies any chest pain, shortness of breath, new weakness or any numbness or tingling. Afebrile   Objective:   Blood pressure 148/61, pulse 72, temperature 98.6 F (37 C), temperature source Oral, resp. rate 19, height 5' 9.5" (1.765 m), weight 81.738 kg (180 lb 3.2 oz), SpO2 95 %.  Wt Readings from Last 3 Encounters:  08/01/14 81.738 kg (180 lb 3.2 oz)  01/24/14 81.375 kg (179 lb 6.4 oz)  05/27/13 81.194 kg (179 lb)     Intake/Output Summary (Last 24 hours) at 08/01/14 1131 Last data filed at 08/01/14 0500  Gross per 24 hour  Intake 2792.5 ml  Output      0 ml  Net 2792.5 ml    Exam  General: Alert and oriented x 3, NAD  HEENT:  PERRLA, EOMI  Neck:  Supple, no JVD, no masses  CVS: S1 S2 clear, no mrg  Respiratory: CTAB  Abdomen: Soft, nontender,+ bowel sounds   Ext: no c/c/e b/l  Neuro: no new focal neurological deficits  Skin: No rashes  Psych: Alert and oriented 3   Data Review   Micro Results Recent Results (from the past 240 hour(s))  Surgical pcr screen     Status: None   Collection Time: 07/30/14  8:18 AM  Result Value Ref Range Status   MRSA, PCR NEGATIVE NEGATIVE Final   Staphylococcus aureus NEGATIVE NEGATIVE Final    Comment:        The Xpert SA Assay (FDA approved for NASAL specimens in patients over 44 years of age), is one component of a comprehensive surveillance program.  Test performance has been validated by [facility] for patients greater than or equal to 74 year old. It is not intended to diagnose infection nor to guide or monitor treatment.     Radiology Reports Dg Chest 2 View  07/26/2014   CLINICAL DATA:  Pleural effusion.  EXAM: CHEST  2 VIEW  COMPARISON:  02/17/2012  FINDINGS: Patient's nasogastric tube in place, tip coiled back upon itself into the lower esophagus. Status post median sternotomy and CABG. Heart is enlarged. There is mild interstitial edema. There patchy infiltrates within the left upper lobe and left lower lobe. Left pleural effusion. Persistent wedge compression of T10, unchanged. Mild dilatation of small bowel loops, unchanged.  IMPRESSION: 1. Cardiomegaly and mild edema. 2. Left upper lobe and left lower lobe infiltrates associated with left pleural effusion. 3. Nasogastric tube coiled back upon itself into lower esophagus. 4. The salient findings were discussed with Lordis on 07/26/2014 at 8:04 pm.   Electronically Signed   By: Nolon Nations M.D.   On: 07/26/2014 20:04   Ct Abdomen Pelvis W Contrast  07/25/2014   CLINICAL DATA:  Mid to lower abdominal pain since 12 hours ago. Vomiting.  EXAM: CT ABDOMEN AND PELVIS WITH CONTRAST  TECHNIQUE: Multidetector CT imaging of  the abdomen and pelvis was performed using the standard protocol following bolus administration of intravenous contrast.  CONTRAST:  168mL OMNIPAQUE IOHEXOL 300 MG/ML  SOLN  COMPARISON:  None.  FINDINGS: There is a left effusion layering dependently with patchy density in the dependent left lung that could be atelectasis or pneumonia.  The liver shows a pattern raising the  possibility of early cirrhosis. There are 2 benign calcifications in the left lobe. There are calcified stones in the gallbladder neck. No stone is seen along the course of the common duct. The spleen is normal. There are bilateral low-density adrenal adenomas. The kidneys are normal. The aorta and its branch vessels show extensive atherosclerotic disease. No aneurysm. Bladder, prostate gland and seminal vesicles are unremarkable.  There are dilated fluid and air-filled loops of small intestine consistent with partial small bowel obstruction. There is a caliber change at the mid ileum. Specific obstructing lesion is not identified. There is free intraperitoneal fluid but no free intraperitoneal air are identified. Old vertebral augmentation at T10. Chronic bilateral pars defects at L5.  IMPRESSION: Partial small bowel obstruction. Level of the obstruction is probably mid ileum. Specific etiology not demonstrated.  Free fluid which could relate to the small bowel obstruction or could be secondary to cirrhosis. The patient does appear to have cirrhosis of the liver.  Gallstones.  Advanced widespread atherosclerosis.  Left pleural effusion. Atelectasis and/or pneumonia in the dependent left lower lobe.   Electronically Signed   By: Nelson Chimes M.D.   On: 07/25/2014 21:28   Dg Abd 2 Views  08/01/2014   CLINICAL DATA:  Follow-up of small bowel obstruction.  EXAM: ABDOMEN - 2 VIEW  COMPARISON:  Supine and upright abdominal films of Jul 30, 2014  FINDINGS: There has been interval removal of the esophagogastric tube. There remain loops of mildly  distended gas and fluid-filled small bowel in the midline. The volume of gas present has decreased somewhat. There is some gas and fluid within the colon. Previously demonstrated colonic contrast has cleared. A small amount of air is present within the stomach. No definite extraluminal gas collections are demonstrated.  IMPRESSION: Persistent mid to distal relatively high-grade small bowel obstruction. Interval removal of the esophagogastric tube.  If the patient's clinical status has deteriorated, abdominal and pelvic CT scanning now would be useful.   Electronically Signed   By: Deejay  Martinique M.D.   On: 08/01/2014 10:38   Dg Abd 2 Views  07/30/2014   CLINICAL DATA:  Small-bowel obstruction.  EXAM: ABDOMEN - 2 VIEW  COMPARISON:  07/29/2014 .  FINDINGS: NG tube in stable position. Soft tissue structures are unremarkable. Persistent dilated loops of small bowel are again noted consistent with small bowel obstruction. Oral contrast again noted colon. No free air. Prior median sternotomy. Surgical clips left upper quadrant.  IMPRESSION: Persistent unchanged dilated loops of small bowel consistent with small bowel obstruction. Oral contrast is noted in the colon. No free air. NG tube in stable position.   Electronically Signed   By: Marcello Moores  Register   On: 07/30/2014 07:11   Dg Abd 2 Views  07/28/2014   CLINICAL DATA:  Ileus, nausea.  EXAM: ABDOMEN - 2 VIEW  COMPARISON:  07/27/2014  FINDINGS: NG tube is present in the stomach. There are dilated small bowel loops with air-fluid levels. Degree of small bowel dilatation has increased since prior study. Colon is decompressed. No free air organomegaly. No suspicious calcification. Vascular calcifications noted in the aorta and iliac vessels. No acute bony abnormality.  IMPRESSION: Worsening small bowel dilatation compatible with worsening small bowel obstruction.   Electronically Signed   By: Rolm Baptise M.D.   On: 07/28/2014 08:57   Dg Abd Portable 1v  07/29/2014    CLINICAL DATA:  24 hour delay with Gastrografin. Small bowel protocol.  EXAM: PORTABLE ABDOMEN - 1 VIEW  COMPARISON:  07/28/2014  FINDINGS: Persistent gaseous distended loops of small bowel are demonstrated within the central abdomen measuring up to 3.5 cm, compatible with small bowel obstruction. Oral contrast material is demonstrated to the right lower quadrant, within the cecum and proximal ascending colon. NG tube projects over the left upper quadrant. Left upper quadrant surgical clips. Lower lumbar spine degenerative changes.  IMPRESSION: Oral contrast material demonstrated within the right lower quadrant, likely within the cecum and ascending colon.  Persistent gaseous distended loops of small bowel, compatible with obstruction.   Electronically Signed   By: Lovey Newcomer M.D.   On: 07/29/2014 13:40   Dg Abd Portable 1v-small Bowel Obstruction Protocol-initial, 8 Hr Delay  07/29/2014   CLINICAL DATA:  Small bowel obstruction  EXAM: PORTABLE ABDOMEN - 1 VIEW  COMPARISON:  07/28/2014  FINDINGS: The nasogastric tube extends into the proximal stomach with the side-port in the region of the EG junction.  There is persistent dilatation and stacking of small bowel loops consistent with small bowel obstruction. No free air is evident. No interval change is evident.  IMPRESSION: Persistent small bowel obstruction.   Electronically Signed   By: Andreas Newport M.D.   On: 07/29/2014 02:27   Dg Abd Portable 1v  07/27/2014   CLINICAL DATA:  Small-bowel obstruction.  EXAM: PORTABLE ABDOMEN - 1 VIEW  COMPARISON:  07/25/2014  FINDINGS: Nasogastric tube is partially imaged, tip overlying the level of the stomach. There are persistent dilated small bowel loops in the central abdomen. Nondilated loops of large bowel persist. There is a small amount of residual contrast in the bladder.  IMPRESSION: 1. Persistent small bowel dilatation. 2. Residual contrast in the bladder.   Electronically Signed   By: Nolon Nations M.D.    On: 07/27/2014 09:10   Dg Abd Portable 1v  07/25/2014   CLINICAL DATA:  Abdominal pain.  Nausea and vomiting for 1 day.  EXAM: PORTABLE ABDOMEN - 1 VIEW  COMPARISON:  CT of earlier in the day  FINDINGS: Single supine portable view. Contrast within the urinary bladder. Gastric distension with possible nasogastric tube, incompletely imaged. Proximal small bowel loops measure up to 3.2 cm. Gas and stool within the colon. Advanced atherosclerosis.  IMPRESSION: Partial small bowel obstruction, without free intraperitoneal air or other acute complication.   Electronically Signed   By: Abigail Miyamoto M.D.   On: 07/25/2014 23:46    CBC  Recent Labs Lab 07/25/14 1828 07/26/14 0411 07/27/14 0550 07/28/14 0400  WBC 9.5 12.3* 9.3 7.8  HGB 11.0* 10.6* 8.9* 9.0*  HCT 30.5* 30.6* 25.5* 25.8*  PLT 175 152 152 148*  MCV 86.6 89.0 88.2 89.9  MCH 31.3 30.8 30.8 31.4  MCHC 36.1* 34.6 34.9 34.9  RDW 14.8 15.0 15.1 15.4  LYMPHSABS 0.7  --   --  0.9  MONOABS 0.8  --   --  0.9  EOSABS 0.0  --   --  0.1  BASOSABS 0.0  --   --  0.0    Chemistries   Recent Labs Lab 07/25/14 1828 07/26/14 0411  07/28/14 0400 07/29/14 1450 07/29/14 1650 07/30/14 0420 07/31/14 0353 08/01/14 0409 08/01/14 0820  NA 132* 130*  < > 133*  --  134* 132* 132*  --  134*  K 3.3* 3.9  < > 3.7  --  3.2* 3.3* 3.3*  --  4.0  CL 97 96  < > 101  --  100* 99* 100*  --  101  CO2 22 19  < >  23  --  22 22 21*  --  22  GLUCOSE 89 242*  < > 171*  --  132* 150* 162*  --  141*  BUN 18 23  < > 22*  --  19 19 18   --  18  CREATININE 1.06 1.06  < > 0.86  --  0.77 0.74 0.80 0.80 0.82  CALCIUM 9.7 8.9  < > 8.7*  --  9.0 8.6* 8.8*  --  9.0  MG  --   --   --   --  1.9  --   --   --   --   --   AST 27 26  --   --   --   --   --   --   --   --   ALT 18 16  --   --   --   --   --   --   --   --   ALKPHOS 109 106  --   --   --   --   --   --   --   --   BILITOT 1.6* 1.1  --   --   --   --   --   --   --   --   < > = values in this interval not  displayed. ------------------------------------------------------------------------------------------------------------------ estimated creatinine clearance is 81.6 mL/min (by C-G formula based on Cr of 0.82). ------------------------------------------------------------------------------------------------------------------ No results for input(s): HGBA1C in the last 72 hours. ------------------------------------------------------------------------------------------------------------------ No results for input(s): CHOL, HDL, LDLCALC, TRIG, CHOLHDL, LDLDIRECT in the last 72 hours. ------------------------------------------------------------------------------------------------------------------ No results for input(s): TSH, T4TOTAL, T3FREE, THYROIDAB in the last 72 hours.  Invalid input(s): FREET3 ------------------------------------------------------------------------------------------------------------------ No results for input(s): VITAMINB12, FOLATE, FERRITIN, TIBC, IRON, RETICCTPCT in the last 72 hours.  Coagulation profile No results for input(s): INR, PROTIME in the last 168 hours.  No results for input(s): DDIMER in the last 72 hours.  Cardiac Enzymes No results for input(s): CKMB, TROPONINI, MYOGLOBIN in the last 168 hours.  Invalid input(s): CK ------------------------------------------------------------------------------------------------------------------ Invalid input(s): [place]  07/31/14 1247 07/31/14 1652 07/31/14 2001 07/31/14 2334 08/01/14 0411 08/01/14 0803  GLUCAP 235* 163* 145* 159* 148* 154*     RAI,RIPUDEEP M.D. [facility] 08/01/2014, 11:31 AM  Pager: [phone]   Between 7am to 7pm - call Pager - [phone]  After 7pm go to [email or web address] - password [email or web address]  Call night coverage person covering after 7pm

## 2014-08-01 NOTE — Progress Notes (Signed)
Patient ID: [name], male   DOB: 07-15-1940, 74 y.o.   MRN: [id number]    Subjective: Pt had an episode of emesis overnight and is nauseated this morning.  He is still having BMs though.  Objective: Vital signs in last 24 hours: Temp:  [98.1 F (36.7 C)-98.6 F (37 C)] 98.6 F (37 C) (05/06 0527) Pulse Rate:  [70-72] 72 (05/06 0527) Resp:  [16-19] 19 (05/06 0527) BP: (147-151)/(61-65) 148/61 mmHg (05/06 0527) SpO2:  [95 %-97 %] 95 % (05/06 0527) Weight:  [81.738 kg (180 lb 3.2 oz)] 81.738 kg (180 lb 3.2 oz) (05/06 0500) Last BM Date: 07/31/14  Intake/Output from previous day: 05/05 0701 - 05/06 0700 In: 3412.5 [P.O.:2180; I.V.:1232.5] Out: -  Intake/Output this shift:    PE: Abd: soft, nontender, mild bloating, +BS Heart: regular Lungs: CTAB  Lab Results:  No results for input(s): WBC, HGB, HCT, PLT in the last 72 hours. BMET  Recent Labs  07/30/14 0420 07/31/14 0353 08/01/14 0409  NA 132* 132*  --   K 3.3* 3.3*  --   CL 99* 100*  --   CO2 22 21*  --   GLUCOSE 150* 162*  --   BUN 19 18  --   CREATININE 0.74 0.80 0.80  CALCIUM 8.6* 8.8*  --    PT/INR No results for input(s): LABPROT, INR in the last 72 hours. CMP     Component Value Date/Time   NA 132* 07/31/2014 0353   K 3.3* 07/31/2014 0353   CL 100* 07/31/2014 0353   CO2 21* 07/31/2014 0353   GLUCOSE 162* 07/31/2014 0353   BUN 18 07/31/2014 0353   CREATININE 0.80 08/01/2014 0409   CALCIUM 8.8* 07/31/2014 0353   PROT 6.9 07/26/2014 0411   ALBUMIN 3.7 07/26/2014 0411   AST 26 07/26/2014 0411   ALT 16 07/26/2014 0411   ALKPHOS 106 07/26/2014 0411   BILITOT 1.1 07/26/2014 0411   GFRNONAA >60 08/01/2014 0409   GFRAA >60 08/01/2014 0409   Lipase     Component Value Date/Time   LIPASE 31 07/25/2014 1828       Studies/Results: No results found.  Anti-infectives: Anti-infectives    Start     Dose/Rate Route Frequency Ordered Stop   07/30/14 0800  ceFAZolin (ANCEF) IVPB 2 g/50 mL  premix     2 g 100 mL/hr over 30 Minutes Intravenous To Surgery 07/30/14 0746 07/31/14 0800   07/25/14 2245  cefTRIAXone (ROCEPHIN) 1 g in dextrose 5 % 50 mL IVPB     1 g 100 mL/hr over 30 Minutes Intravenous Every 24 hours 07/25/14 2234     07/25/14 2245  azithromycin (ZITHROMAX) 500 mg in dextrose 5 % 250 mL IVPB     500 mg 250 mL/hr over 60 Minutes Intravenous Every 24 hours 07/25/14 2234         Assessment/Plan   SBO -patient was improving, but had emesis and nausea with clear liquids.  He is still having BMs.  Will check plain films today to see what his bowel gas pattern looks like. -keep on clears -will follow  LOS: 7 days    [name] E 08/01/2014, 8:31 AM Pager: [phone]

## 2014-08-01 NOTE — Progress Notes (Signed)
Patient ID: [name], male   DOB: 1940-12-01, 74 y.o.   MRN: [id number]  He has had a little nausea today but no more emesis and is still having BM's His abdomen exam is fairly benign but his Xrays still show a lot of dilated small bowel with gas in the colon.  Will make him NPO at midnight and repeat films tomorrow morning.  If he is not better, then I suspect he will need a laparotomy.

## 2014-08-02 ENCOUNTER — Inpatient Hospital Stay (HOSPITAL_COMMUNITY): Payer: Medicare Other

## 2014-08-02 ENCOUNTER — Inpatient Hospital Stay (HOSPITAL_COMMUNITY): Payer: Medicare Other | Admitting: Anesthesiology

## 2014-08-02 ENCOUNTER — Encounter (HOSPITAL_COMMUNITY): Payer: Self-pay | Admitting: Certified Registered"

## 2014-08-02 ENCOUNTER — Encounter (HOSPITAL_COMMUNITY)
Admission: EM | Disposition: A | Discharge status: Home / Self Care | Payer: Self-pay | Source: Home / Self Care | Attending: Internal Medicine

## 2014-08-02 HISTORY — PX: LAPAROTOMY: SHX154

## 2014-08-02 LAB — BASIC METABOLIC PANEL
ANION GAP: 11 (ref 5–15)
BUN: 13 mg/dL (ref 6–20)
CHLORIDE: 99 mmol/L — AB (ref 101–111)
CO2: 20 mmol/L — AB (ref 22–32)
Calcium: 8.6 mg/dL — ABNORMAL LOW (ref 8.9–10.3)
Creatinine, Ser: 0.8 mg/dL (ref 0.61–1.24)
GFR calc Af Amer: >60 mL/min (ref >60)
GFR calc non Af Amer: >60 mL/min (ref >60)
Glucose, Bld: 144 mg/dL — ABNORMAL HIGH (ref 70–99)
POTASSIUM: 3.5 mmol/L (ref 3.5–5.1)
Sodium: 130 mmol/L — ABNORMAL LOW (ref 135–145)

## 2014-08-02 LAB — GLUCOSE, CAPILLARY
GLUCOSE-CAPILLARY: 152 mg/dL — AB (ref 70–99)
Glucose-Capillary: 120 mg/dL — ABNORMAL HIGH (ref 70–99)
Glucose-Capillary: 121 mg/dL — ABNORMAL HIGH (ref 70–99)
Glucose-Capillary: 134 mg/dL — ABNORMAL HIGH (ref 70–99)
Glucose-Capillary: 135 mg/dL — ABNORMAL HIGH (ref 70–99)
Glucose-Capillary: 151 mg/dL — ABNORMAL HIGH (ref 70–99)

## 2014-08-02 LAB — CBC
HCT: 25.9 % — ABNORMAL LOW (ref 39.0–52.0)
Hemoglobin: 9 g/dL — ABNORMAL LOW (ref 13.0–17.0)
MCH: 30.8 pg (ref 26.0–34.0)
MCHC: 34.7 g/dL (ref 30.0–36.0)
MCV: 88.7 fL (ref 78.0–100.0)
PLATELETS: 202 10*3/uL (ref 150–400)
RBC: 2.92 MIL/uL — ABNORMAL LOW (ref 4.22–5.81)
RDW: 14.5 % (ref 11.5–15.5)
WBC: 7.3 10*3/uL (ref 4.0–10.5)

## 2014-08-02 LAB — MRSA PCR SCREENING: MRSA BY PCR: NEGATIVE

## 2014-08-02 SURGERY — LAPAROTOMY, EXPLORATORY
Anesthesia: General | Site: Abdomen

## 2014-08-02 MED ORDER — ONDANSETRON HCL 4 MG/2ML IJ SOLN: 4.0000 mg | Freq: Once | INTRAMUSCULAR | Status: DC | PRN

## 2014-08-02 MED ORDER — DIPHENHYDRAMINE HCL 12.5 MG/5ML PO ELIX: 12.5000 mg | ORAL_SOLUTION | Freq: Four times a day (QID) | ORAL | Status: DC | PRN

## 2014-08-02 MED ORDER — HYDROMORPHONE HCL 1 MG/ML IJ SOLN: 0.2500 mg | INTRAMUSCULAR | Status: DC | PRN

## 2014-08-02 MED ORDER — ONDANSETRON HCL 4 MG/2ML IJ SOLN
INTRAMUSCULAR | Status: AC
  Filled 2014-08-02: qty 2

## 2014-08-02 MED ORDER — MIDAZOLAM HCL 5 MG/5ML IJ SOLN
INTRAMUSCULAR | Status: DC | PRN
  Administered 2014-08-02: 2 mg via INTRAVENOUS

## 2014-08-02 MED ORDER — ROCURONIUM BROMIDE 100 MG/10ML IV SOLN
INTRAVENOUS | Status: DC | PRN
  Administered 2014-08-02: 15 mg via INTRAVENOUS

## 2014-08-02 MED ORDER — SODIUM CHLORIDE 0.9 % IJ SOLN: 9.0000 mL | INTRAMUSCULAR | Status: DC | PRN

## 2014-08-02 MED ORDER — LACTATED RINGERS IV SOLN: INTRAVENOUS | Status: DC | PRN

## 2014-08-02 MED ORDER — DIPHENHYDRAMINE HCL 50 MG/ML IJ SOLN: 12.5000 mg | Freq: Four times a day (QID) | INTRAMUSCULAR | Status: DC | PRN

## 2014-08-02 MED ORDER — WHITE PETROLATUM GEL
Status: AC
  Filled 2014-08-02: qty 1

## 2014-08-02 MED ORDER — FENTANYL CITRATE (PF) 250 MCG/5ML IJ SOLN
INTRAMUSCULAR | Status: DC | PRN
  Administered 2014-08-02: 100 ug via INTRAVENOUS
  Administered 2014-08-02: 150 ug via INTRAVENOUS

## 2014-08-02 MED ORDER — GLYCOPYRROLATE 0.2 MG/ML IJ SOLN
INTRAMUSCULAR | Status: DC | PRN
  Administered 2014-08-02: 0.2 mg via INTRAVENOUS
  Administered 2014-08-02: 0.4 mg via INTRAVENOUS

## 2014-08-02 MED ORDER — NALOXONE HCL 0.4 MG/ML IJ SOLN: 0.4000 mg | INTRAMUSCULAR | Status: DC | PRN

## 2014-08-02 MED ORDER — LACTATED RINGERS IV SOLN
INTRAVENOUS | Status: DC | PRN
  Administered 2014-08-02 (×2): via INTRAVENOUS

## 2014-08-02 MED ORDER — ENOXAPARIN SODIUM 40 MG/0.4ML ~~LOC~~ SOLN
40.0000 mg | SUBCUTANEOUS | Status: DC
  Administered 2014-08-03 – 2014-08-07 (×5): 40 mg via SUBCUTANEOUS
  Filled 2014-08-02 (×5): qty 0.4

## 2014-08-02 MED ORDER — SCOPOLAMINE 1 MG/3DAYS TD PT72
MEDICATED_PATCH | TRANSDERMAL | Status: AC
  Administered 2014-08-02: 1 via TRANSDERMAL
  Filled 2014-08-02: qty 1

## 2014-08-02 MED ORDER — PROPOFOL 10 MG/ML IV BOLUS
INTRAVENOUS | Status: AC
  Filled 2014-08-02: qty 20

## 2014-08-02 MED ORDER — ONDANSETRON HCL 4 MG/2ML IJ SOLN
INTRAMUSCULAR | Status: DC | PRN
  Administered 2014-08-02: 4 mg via INTRAVENOUS

## 2014-08-02 MED ORDER — MEPERIDINE HCL 25 MG/ML IJ SOLN: 6.2500 mg | INTRAMUSCULAR | Status: DC | PRN

## 2014-08-02 MED ORDER — PROPOFOL 10 MG/ML IV BOLUS
INTRAVENOUS | Status: DC | PRN
  Administered 2014-08-02: 120 mg via INTRAVENOUS

## 2014-08-02 MED ORDER — SUCCINYLCHOLINE CHLORIDE 20 MG/ML IJ SOLN
INTRAMUSCULAR | Status: DC | PRN
  Administered 2014-08-02: 140 mg via INTRAVENOUS

## 2014-08-02 MED ORDER — CEFAZOLIN SODIUM-DEXTROSE 2-3 GM-% IV SOLR
INTRAVENOUS | Status: AC
  Administered 2014-08-02: 2 g via INTRAVENOUS
  Filled 2014-08-02: qty 50

## 2014-08-02 MED ORDER — ONDANSETRON HCL 4 MG/2ML IJ SOLN
4.0000 mg | Freq: Four times a day (QID) | INTRAMUSCULAR | Status: DC | PRN
  Filled 2014-08-02: qty 2

## 2014-08-02 MED ORDER — NEOSTIGMINE METHYLSULFATE 10 MG/10ML IV SOLN
INTRAVENOUS | Status: DC | PRN
  Administered 2014-08-02: 1 mg via INTRAVENOUS
  Administered 2014-08-02: 2 mg via INTRAVENOUS

## 2014-08-02 MED ORDER — NEOSTIGMINE METHYLSULFATE 10 MG/10ML IV SOLN
INTRAVENOUS | Status: AC
  Filled 2014-08-02: qty 1

## 2014-08-02 MED ORDER — MIDAZOLAM HCL 2 MG/2ML IJ SOLN
INTRAMUSCULAR | Status: AC
  Filled 2014-08-02: qty 2

## 2014-08-02 MED ORDER — FENTANYL CITRATE (PF) 250 MCG/5ML IJ SOLN
INTRAMUSCULAR | Status: AC
  Filled 2014-08-02: qty 5

## 2014-08-02 MED ORDER — 0.9 % SODIUM CHLORIDE (POUR BTL) OPTIME
TOPICAL | Status: DC | PRN
  Administered 2014-08-02: 1000 mL

## 2014-08-02 MED ORDER — LIDOCAINE HCL (CARDIAC) 20 MG/ML IV SOLN
INTRAVENOUS | Status: DC | PRN
  Administered 2014-08-02: 100 mg via INTRAVENOUS

## 2014-08-02 MED ORDER — MORPHINE SULFATE (PF) 1 MG/ML IV SOLN
INTRAVENOUS | Status: AC
  Filled 2014-08-02: qty 25

## 2014-08-02 MED ORDER — MORPHINE SULFATE (PF) 1 MG/ML IV SOLN
INTRAVENOUS | Status: DC
  Administered 2014-08-02: 10 mg via INTRAVENOUS
  Administered 2014-08-02: 18:00:00 via INTRAVENOUS
  Administered 2014-08-03: 3 mg via INTRAVENOUS
  Administered 2014-08-03: 6 mg via INTRAVENOUS
  Administered 2014-08-03: 1 mg via INTRAVENOUS
  Administered 2014-08-03: 2 mg via INTRAVENOUS
  Administered 2014-08-03: 7 mg via INTRAVENOUS
  Administered 2014-08-03: 4 mg via INTRAVENOUS
  Administered 2014-08-04: 2 mg via INTRAVENOUS
  Administered 2014-08-04 (×3): 3 mg via INTRAVENOUS
  Administered 2014-08-04 – 2014-08-05 (×2): 1 mg via INTRAVENOUS
  Filled 2014-08-02 (×2): qty 25

## 2014-08-02 MED ORDER — SODIUM CHLORIDE 0.9 % IV SOLN
INTRAVENOUS | Status: DC
  Administered 2014-08-03 (×3): via INTRAVENOUS

## 2014-08-02 SURGICAL SUPPLY — 47 items
BLADE SURG ROTATE 9660 (MISCELLANEOUS) IMPLANT
CANISTER SUCTION 2500CC (MISCELLANEOUS) ×2 IMPLANT
CHLORAPREP W/TINT 26ML (MISCELLANEOUS) ×2 IMPLANT
COVER MAYO STAND STRL (DRAPES) IMPLANT
COVER SURGICAL LIGHT HANDLE (MISCELLANEOUS) ×2 IMPLANT
DRAPE LAPAROSCOPIC ABDOMINAL (DRAPES) ×2 IMPLANT
DRAPE PROXIMA HALF (DRAPES) IMPLANT
DRAPE UTILITY XL STRL (DRAPES) ×2 IMPLANT
DRAPE WARM FLUID 44X44 (DRAPE) ×2 IMPLANT
DRSG MEPILEX BORDER 4X8 (GAUZE/BANDAGES/DRESSINGS) ×1 IMPLANT
DRSG OPSITE POSTOP 4X10 (GAUZE/BANDAGES/DRESSINGS) IMPLANT
DRSG OPSITE POSTOP 4X8 (GAUZE/BANDAGES/DRESSINGS) IMPLANT
ELECT BLADE 6.5 EXT (BLADE) IMPLANT
ELECT CAUTERY BLADE 6.4 (BLADE) ×3 IMPLANT
ELECT REM PT RETURN 9FT ADLT (ELECTROSURGICAL) ×2
ELECTRODE REM PT RTRN 9FT ADLT (ELECTROSURGICAL) ×1 IMPLANT
GLOVE BIO SURGEON STRL SZ 6.5 (GLOVE) ×1 IMPLANT
GLOVE BIO SURGEON STRL SZ7 (GLOVE) ×2 IMPLANT
GLOVE BIOGEL PI IND STRL 6 (GLOVE) IMPLANT
GLOVE BIOGEL PI IND STRL 7.0 (GLOVE) IMPLANT
GLOVE BIOGEL PI IND STRL 7.5 (GLOVE) ×1 IMPLANT
GLOVE BIOGEL PI INDICATOR 6 (GLOVE) ×1
GLOVE BIOGEL PI INDICATOR 7.0 (GLOVE) ×2
GLOVE BIOGEL PI INDICATOR 7.5 (GLOVE) ×1
GOWN STRL REUS W/ TWL LRG LVL3 (GOWN DISPOSABLE) ×3 IMPLANT
GOWN STRL REUS W/TWL LRG LVL3 (GOWN DISPOSABLE) ×6
KIT BASIN OR (CUSTOM PROCEDURE TRAY) ×2 IMPLANT
KIT ROOM TURNOVER OR (KITS) ×2 IMPLANT
LIGASURE IMPACT 36 18CM CVD LR (INSTRUMENTS) IMPLANT
NS IRRIG 1000ML POUR BTL (IV SOLUTION) ×4 IMPLANT
PACK GENERAL/GYN (CUSTOM PROCEDURE TRAY) ×2 IMPLANT
PAD ARMBOARD 7.5X6 YLW CONV (MISCELLANEOUS) ×2 IMPLANT
PENCIL BUTTON HOLSTER BLD 10FT (ELECTRODE) IMPLANT
SPECIMEN JAR LARGE (MISCELLANEOUS) IMPLANT
SPONGE LAP 18X18 X RAY DECT (DISPOSABLE) ×1 IMPLANT
STAPLER VISISTAT 35W (STAPLE) ×2 IMPLANT
SUCTION POOLE TIP (SUCTIONS) ×2 IMPLANT
SUT PDS AB 1 TP1 96 (SUTURE) ×4 IMPLANT
SUT SILK 2 0 SH CR/8 (SUTURE) ×2 IMPLANT
SUT SILK 2 0 TIES 10X30 (SUTURE) ×2 IMPLANT
SUT SILK 3 0 SH CR/8 (SUTURE) ×2 IMPLANT
SUT SILK 3 0 TIES 10X30 (SUTURE) ×2 IMPLANT
SUT VIC AB 3-0 SH 18 (SUTURE) ×1 IMPLANT
TOWEL OR 17X26 10 PK STRL BLUE (TOWEL DISPOSABLE) ×2 IMPLANT
TRAY FOLEY CATH 16FRSI W/METER (SET/KITS/TRAYS/PACK) ×1 IMPLANT
TUBE CONNECTING 12X1/4 (SUCTIONS) IMPLANT
YANKAUER SUCT BULB TIP NO VENT (SUCTIONS) IMPLANT

## 2014-08-02 NOTE — Op Note (Signed)
Pre-op Diagnosis:  Small bowel obstruction Post-op Diagnosis:  Same Procedure:  Exploratory laparotomy/ lysis of adhesions Surgeon:  Keylani Perlstein K. Anesthesia:  GETT Indications:  This is a 74 year old male with no past abdominal surgical history who presents with signs and symptoms of a small bowel obstruction. His course has waxed and waned over the last several days. He remains significantly distended and his x-rays show signs of persistent small bowel obstruction. Due to his lack of progress we recommended exploratory laparotomy today.  Description of procedure: The patient brought to the operating room and placed in supine position on the operating room table. After an adequate level of general anesthesia was obtained a Foley catheter was placed under sterile technique. The patient's abdomen was prepped with ChloraPrep and draped sterile fashion. A timeout was taken to ensure the proper patient and proper procedure. We made a vertical midline incision around the umbilicus. Dissection was carried down to the fascia which was divided vertically. We entered the peritoneal cavity sharply. There was some thin ascites throughout the abdomen. The visualized portions of the small bowel was moderately dilated. I begin palpated small bowel and I felt a very tight band just to the left of midline below the level of the umbilicus. I exposed this band of adhesions. This was a small tongue of omentum that had become adherent down into the pelvis. We divided this with cautery and excised it completely. This released the obstruction. I examined the small bowel in its entirety from ligament of Treitz to the cecum. There are no other signs of obstruction. The area of obstruction that was released seem to relax and there is no sign of stricture. The colon that was visualized appeared to be normal. We replaced the small bowel within the abdomen and on closed the fascia with double-stranded #1 PDS suture. The subcutaneous  tissues tissues were irrigated and staples were used to close the skin. A sterile dressing was applied. The patient was then extubated and brought to the recovery room in stable condition. His Foley catheter was removed. All sponge, instrument, and needle counts are correct.  Imogene Burn. Georgette Dover, MD, [facility]  General/ Trauma Surgery  08/02/2014 5:00 PM

## 2014-08-02 NOTE — Transfer of Care (Signed)
Immediate Anesthesia Transfer of Care Note  Patient: [name]  Procedure(s) Performed: Procedure(s): EXPLORATORY LAPAROTOMY LYSIS OF ADHESIONS (N/A)  Patient Location: PACU  Anesthesia Type:General  Level of Consciousness: awake  Airway & Oxygen Therapy: Patient Spontanous Breathing and Patient connected to nasal cannula oxygen  Post-op Assessment: Report given to RN and Post -op Vital signs reviewed and stable  Post vital signs: Reviewed and stable  Last Vitals:  Filed Vitals:   08/02/14 0430  BP: 133/59  Pulse: 64  Temp: 36.9 C  Resp: 17    Complications: No apparent anesthesia complications

## 2014-08-02 NOTE — Anesthesia Postprocedure Evaluation (Signed)
Anesthesia Post Note  Patient: [name]  Procedure(s) Performed: Procedure(s) (LRB): EXPLORATORY LAPAROTOMY LYSIS OF ADHESIONS (N/A)  Anesthesia type: general  Patient location: PACU  Post pain: Pain level controlled  Post assessment: Patient's Cardiovascular Status Stable  Last Vitals:  Filed Vitals:   08/02/14 1733  BP:   Pulse:   Temp:   Resp: 21    Post vital signs: Reviewed and stable  Level of consciousness: sedated  Complications: No apparent anesthesia complications

## 2014-08-02 NOTE — Progress Notes (Signed)
[facility]                                                                              Patient Demographics  [name], is a 74 y.o. male, DOB - 03-Mar-1941, BMW:[id number]  Admit date - 07/25/2014   Admitting Physician Jani Gravel, MD  Outpatient Primary MD for the patient is  Melinda Crutch, MD  LOS - 8   Chief Complaint  Patient presents with  . Abdominal Pain       Brief HPI   Patient is a 74 year old male with CAD, diabetes type 2, hypertension, hyperlipidemia presented with abdominal pain and described as periumbilical, sharp with nausea and vomiting. He denied any fevers or chills, diarrhea or melena or hematochezia. Patient underwent CT of the abdomen and pelvis which showed partial small bowel traction. Patient was admitted for further workup.  Assessment & Plan    Principal Problem:   SBO (small bowel obstruction)-  - Feeling bloated today, having abdominal pain and no stool this morning - Currently NPO, possible surgery today, await recommendations  Active Problems:   CAD (coronary artery disease): - Currently stable, no chest pain or shortness of breath    Diabetes mellitus - Hemoglobin A1c 6.4, continue sliding scale insulin    HTN (hypertension) - Well-controlled, continue Norvasc, Coreg  Questionable left pleural effusion on CT abdomen, CAP on CXR.  - No coughing or respiratory symptoms, afebrile. Chest x-ray showed patchy infiltrates in the upper lobe and left lower lobe, continue IV antibiotics for now, repeat chest x-ray in 4-6 weeks to ensure complete resolution of pneumonia.  Normocytic anemia - Currently stable  Code Status:Full code   Family Communication: Discussed in detail with the patient, all imaging results, lab results explained to the patient and and wife at bedside  Disposition Plan: not medically ready  Time Spent in minutes  25 minutes  Procedures  abd Xray CT abd  Consults   Surgery  DVT Prophylaxis   SCD's  Medications  Scheduled Meds: . amLODipine  5 mg Oral Daily  . aspirin EC  81 mg Oral Daily  . azithromycin  500 mg Intravenous Q24H  . carvedilol  12.5 mg Oral BID WC  . cefTRIAXone (ROCEPHIN)  IV  1 g Intravenous Q24H  . insulin aspart  0-9 Units Subcutaneous Q4H  . losartan  100 mg Oral Daily  . simvastatin  20 mg Oral QHS   Continuous Infusions: . sodium chloride 75 mL/hr at 08/01/14 2138   PRN Meds:.acetaminophen **OR** acetaminophen, ALPRAZolam, HYDROmorphone (DILAUDID) injection, nitroGLYCERIN, ondansetron (ZOFRAN) IV   Antibiotics   Anti-infectives    Start     Dose/Rate Route Frequency Ordered Stop   07/30/14 0800  ceFAZolin (ANCEF) IVPB 2 g/50 mL premix     2 g 100 mL/hr over 30 Minutes Intravenous To Surgery 07/30/14 0746 07/31/14 0800   07/25/14 2245  cefTRIAXone (ROCEPHIN) 1 g in dextrose 5 % 50 mL IVPB     1 g 100 mL/hr over 30 Minutes Intravenous Every 24 hours 07/25/14 2234     07/25/14 2245  azithromycin (ZITHROMAX) 500 mg in dextrose 5 % 250 mL  IVPB     500 mg 250 mL/hr over 60 Minutes Intravenous Every 24 hours 07/25/14 2234          Subjective:   [name] was seen and examined today. Feels bloated today, abdominal pain, no BM today.  Denies any chest pain, shortness of breath, new weakness or any numbness or tingling. Afebrile   Objective:   Blood pressure 133/59, pulse 64, temperature 98.4 F (36.9 C), temperature source Oral, resp. rate 17, height 5' 9.5" (1.765 m), weight 83.371 kg (183 lb 12.8 oz), SpO2 94 %.  Wt Readings from Last 3 Encounters:  08/02/14 83.371 kg (183 lb 12.8 oz)  01/24/14 81.375 kg (179 lb 6.4 oz)  05/27/13 81.194 kg (179 lb)     Intake/Output Summary (Last 24 hours) at 08/02/14 1034 Last data filed at 08/02/14 0800  Gross per 24 hour  Intake   2235 ml  Output      1 ml  Net   2234 ml    Exam  General: Alert and oriented x 3, NAD  HEENT:  PERRLA, EOMI  Neck: Supple, no JVD, no masses  CVS: S1  S2 clear, no mrg  Respiratory: Clear to auscultation bilaterally  Abdomen: Soft, distended, mild tenderness, hypoactive bowel sounds   Ext: no c/c/e bilateral lower extremities  Neuro: no new focal neurological deficits  Skin: No rashes  Psych: Alert and oriented 3   Data Review   Micro Results Recent Results (from the past 240 hour(s))  Surgical pcr screen     Status: None   Collection Time: 07/30/14  8:18 AM  Result Value Ref Range Status   MRSA, PCR NEGATIVE NEGATIVE Final   Staphylococcus aureus NEGATIVE NEGATIVE Final    Comment:        The Xpert SA Assay (FDA approved for NASAL specimens in patients over 65 years of age), is one component of a comprehensive surveillance program.  Test performance has been validated by [facility] for patients greater than or equal to 21 year old. It is not intended to diagnose infection nor to guide or monitor treatment.     Radiology Reports Dg Chest 2 View  07/26/2014   CLINICAL DATA:  Pleural effusion.  EXAM: CHEST  2 VIEW  COMPARISON:  02/17/2012  FINDINGS: Patient's nasogastric tube in place, tip coiled back upon itself into the lower esophagus. Status post median sternotomy and CABG. Heart is enlarged. There is mild interstitial edema. There patchy infiltrates within the left upper lobe and left lower lobe. Left pleural effusion. Persistent wedge compression of T10, unchanged. Mild dilatation of small bowel loops, unchanged.  IMPRESSION: 1. Cardiomegaly and mild edema. 2. Left upper lobe and left lower lobe infiltrates associated with left pleural effusion. 3. Nasogastric tube coiled back upon itself into lower esophagus. 4. The salient findings were discussed with Lordis on 07/26/2014 at 8:04 pm.   Electronically Signed   By: Nolon Nations M.D.   On: 07/26/2014 20:04   Ct Abdomen Pelvis W Contrast  07/25/2014   CLINICAL DATA:  Mid to lower abdominal pain since 12 hours ago. Vomiting.  EXAM: CT ABDOMEN AND PELVIS WITH  CONTRAST  TECHNIQUE: Multidetector CT imaging of the abdomen and pelvis was performed using the standard protocol following bolus administration of intravenous contrast.  CONTRAST:  175mL OMNIPAQUE IOHEXOL 300 MG/ML  SOLN  COMPARISON:  None.  FINDINGS: There is a left effusion layering dependently with patchy density in the dependent left lung that could be atelectasis or  pneumonia.  The liver shows a pattern raising the possibility of early cirrhosis. There are 2 benign calcifications in the left lobe. There are calcified stones in the gallbladder neck. No stone is seen along the course of the common duct. The spleen is normal. There are bilateral low-density adrenal adenomas. The kidneys are normal. The aorta and its branch vessels show extensive atherosclerotic disease. No aneurysm. Bladder, prostate gland and seminal vesicles are unremarkable.  There are dilated fluid and air-filled loops of small intestine consistent with partial small bowel obstruction. There is a caliber change at the mid ileum. Specific obstructing lesion is not identified. There is free intraperitoneal fluid but no free intraperitoneal air are identified. Old vertebral augmentation at T10. Chronic bilateral pars defects at L5.  IMPRESSION: Partial small bowel obstruction. Level of the obstruction is probably mid ileum. Specific etiology not demonstrated.  Free fluid which could relate to the small bowel obstruction or could be secondary to cirrhosis. The patient does appear to have cirrhosis of the liver.  Gallstones.  Advanced widespread atherosclerosis.  Left pleural effusion. Atelectasis and/or pneumonia in the dependent left lower lobe.   Electronically Signed   By: Nelson Chimes M.D.   On: 07/25/2014 21:28   Dg Abd 2 Views  08/02/2014   CLINICAL DATA:  74 year old male with a history of small bowel obstruction  EXAM: ABDOMEN - 2 VIEW  COMPARISON:  08/01/2014, 07/30/2014, 07/29/2014, CT 07/25/2014  FINDINGS: Multiple borderline dilated  small bowel loops within the abdomen on the supine image. Upright image demonstrates air-fluid levels.  Paucity of distal colonic gas.  Surgical changes of prior median sternotomy, as well as surgical clips of the left upper quadrant.  Stones within the gallbladder again visualized in the right upper quadrant.  No displaced fracture.  Vascular calcifications.  IMPRESSION: Evidence of persisting small bowel obstruction, with multiple borderline dilated small bowel loops and paucity of colonic gas.  Signed,  Dulcy Fanny. Earleen Newport, DO  [facility]  [facility]   Electronically Signed   By: Corrie Mckusick D.O.   On: 08/02/2014 07:31   Dg Abd 2 Views  08/01/2014   CLINICAL DATA:  Follow-up of small bowel obstruction.  EXAM: ABDOMEN - 2 VIEW  COMPARISON:  Supine and upright abdominal films of Jul 30, 2014  FINDINGS: There has been interval removal of the esophagogastric tube. There remain loops of mildly distended gas and fluid-filled small bowel in the midline. The volume of gas present has decreased somewhat. There is some gas and fluid within the colon. Previously demonstrated colonic contrast has cleared. A small amount of air is present within the stomach. No definite extraluminal gas collections are demonstrated.  IMPRESSION: Persistent mid to distal relatively high-grade small bowel obstruction. Interval removal of the esophagogastric tube.  If the patient's clinical status has deteriorated, abdominal and pelvic CT scanning now would be useful.   Electronically Signed   By: Dailen  Martinique M.D.   On: 08/01/2014 10:38   Dg Abd 2 Views  07/30/2014   CLINICAL DATA:  Small-bowel obstruction.  EXAM: ABDOMEN - 2 VIEW  COMPARISON:  07/29/2014 .  FINDINGS: NG tube in stable position. Soft tissue structures are unremarkable. Persistent dilated loops of small bowel are again noted consistent with small bowel obstruction. Oral contrast again noted colon. No free air. Prior median  sternotomy. Surgical clips left upper quadrant.  IMPRESSION: Persistent unchanged dilated loops of small bowel consistent with small bowel obstruction. Oral contrast is noted in  the colon. No free air. NG tube in stable position.   Electronically Signed   By: Marcello Moores  Register   On: 07/30/2014 07:11   Dg Abd 2 Views  07/28/2014   CLINICAL DATA:  Ileus, nausea.  EXAM: ABDOMEN - 2 VIEW  COMPARISON:  07/27/2014  FINDINGS: NG tube is present in the stomach. There are dilated small bowel loops with air-fluid levels. Degree of small bowel dilatation has increased since prior study. Colon is decompressed. No free air organomegaly. No suspicious calcification. Vascular calcifications noted in the aorta and iliac vessels. No acute bony abnormality.  IMPRESSION: Worsening small bowel dilatation compatible with worsening small bowel obstruction.   Electronically Signed   By: Rolm Baptise M.D.   On: 07/28/2014 08:57   Dg Abd Portable 1v  07/29/2014   CLINICAL DATA:  24 hour delay with Gastrografin. Small bowel protocol.  EXAM: PORTABLE ABDOMEN - 1 VIEW  COMPARISON:  07/28/2014  FINDINGS: Persistent gaseous distended loops of small bowel are demonstrated within the central abdomen measuring up to 3.5 cm, compatible with small bowel obstruction. Oral contrast material is demonstrated to the right lower quadrant, within the cecum and proximal ascending colon. NG tube projects over the left upper quadrant. Left upper quadrant surgical clips. Lower lumbar spine degenerative changes.  IMPRESSION: Oral contrast material demonstrated within the right lower quadrant, likely within the cecum and ascending colon.  Persistent gaseous distended loops of small bowel, compatible with obstruction.   Electronically Signed   By: Lovey Newcomer M.D.   On: 07/29/2014 13:40   Dg Abd Portable 1v-small Bowel Obstruction Protocol-initial, 8 Hr Delay  07/29/2014   CLINICAL DATA:  Small bowel obstruction  EXAM: PORTABLE ABDOMEN - 1 VIEW  COMPARISON:   07/28/2014  FINDINGS: The nasogastric tube extends into the proximal stomach with the side-port in the region of the EG junction.  There is persistent dilatation and stacking of small bowel loops consistent with small bowel obstruction. No free air is evident. No interval change is evident.  IMPRESSION: Persistent small bowel obstruction.   Electronically Signed   By: Andreas Newport M.D.   On: 07/29/2014 02:27   Dg Abd Portable 1v  07/27/2014   CLINICAL DATA:  Small-bowel obstruction.  EXAM: PORTABLE ABDOMEN - 1 VIEW  COMPARISON:  07/25/2014  FINDINGS: Nasogastric tube is partially imaged, tip overlying the level of the stomach. There are persistent dilated small bowel loops in the central abdomen. Nondilated loops of large bowel persist. There is a small amount of residual contrast in the bladder.  IMPRESSION: 1. Persistent small bowel dilatation. 2. Residual contrast in the bladder.   Electronically Signed   By: Nolon Nations M.D.   On: 07/27/2014 09:10   Dg Abd Portable 1v  07/25/2014   CLINICAL DATA:  Abdominal pain.  Nausea and vomiting for 1 day.  EXAM: PORTABLE ABDOMEN - 1 VIEW  COMPARISON:  CT of earlier in the day  FINDINGS: Single supine portable view. Contrast within the urinary bladder. Gastric distension with possible nasogastric tube, incompletely imaged. Proximal small bowel loops measure up to 3.2 cm. Gas and stool within the colon. Advanced atherosclerosis.  IMPRESSION: Partial small bowel obstruction, without free intraperitoneal air or other acute complication.   Electronically Signed   By: Abigail Miyamoto M.D.   On: 07/25/2014 23:46    CBC  Recent Labs Lab 07/27/14 0550 07/28/14 0400 08/02/14 0423  WBC 9.3 7.8 7.3  HGB 8.9* 9.0* 9.0*  HCT 25.5* 25.8* 25.9*  PLT  152 148* 202  MCV 88.2 89.9 88.7  MCH 30.8 31.4 30.8  MCHC 34.9 34.9 34.7  RDW 15.1 15.4 14.5  LYMPHSABS  --  0.9  --   MONOABS  --  0.9  --   EOSABS  --  0.1  --   BASOSABS  --  0.0  --     Chemistries    Recent Labs Lab 07/29/14 1450 07/29/14 1650 07/30/14 0420 07/31/14 0353 08/01/14 0409 08/01/14 0820 08/02/14 0423  NA  --  134* 132* 132*  --  134* 130*  K  --  3.2* 3.3* 3.3*  --  4.0 3.5  CL  --  100* 99* 100*  --  101 99*  CO2  --  22 22 21*  --  22 20*  GLUCOSE  --  132* 150* 162*  --  141* 144*  BUN  --  19 19 18   --  18 13  CREATININE  --  0.77 0.74 0.80 0.80 0.82 0.80  CALCIUM  --  9.0 8.6* 8.8*  --  9.0 8.6*  MG 1.9  --   --   --   --   --   --    ------------------------------------------------------------------------------------------------------------------ estimated creatinine clearance is 83.6 mL/min (by C-G formula based on Cr of 0.8). ------------------------------------------------------------------------------------------------------------------ No results for input(s): HGBA1C in the last 72 hours. ------------------------------------------------------------------------------------------------------------------ No results for input(s): CHOL, HDL, LDLCALC, TRIG, CHOLHDL, LDLDIRECT in the last 72 hours. ------------------------------------------------------------------------------------------------------------------ No results for input(s): TSH, T4TOTAL, T3FREE, THYROIDAB in the last 72 hours.  Invalid input(s): FREET3 ------------------------------------------------------------------------------------------------------------------ No results for input(s): VITAMINB12, FOLATE, FERRITIN, TIBC, IRON, RETICCTPCT in the last 72 hours.  Coagulation profile No results for input(s): INR, PROTIME in the last 168 hours.  No results for input(s): DDIMER in the last 72 hours.  Cardiac Enzymes No results for input(s): CKMB, TROPONINI, MYOGLOBIN in the last 168 hours.  Invalid input(s): CK ------------------------------------------------------------------------------------------------------------------ Invalid input(s): Jackson  08/01/14 0803  08/01/14 1155 08/01/14 1950 08/01/14 2340 08/02/14 0333 08/02/14 0744  GLUCAP 154* 168* 119* 205* 152* 121*     RAI,RIPUDEEP M.D. [facility] 08/02/2014, 10:34 AM  Pager: [phone]   Between 7am to 7pm - call Pager - [phone]  After 7pm go to [email or web address] - password [email or web address]  Call night coverage person covering after 7pm

## 2014-08-02 NOTE — Anesthesia Procedure Notes (Signed)
Procedure Name: Intubation Date/Time: 08/02/2014 4:12 PM Performed by: Marinda Elk A Pre-anesthesia Checklist: Patient identified, Timeout performed, Emergency Drugs available, Suction available and Patient being monitored Patient Re-evaluated:Patient Re-evaluated prior to inductionOxygen Delivery Method: Circle system utilized Preoxygenation: Pre-oxygenation with 100% oxygen Intubation Type: IV induction, Rapid sequence and Cricoid Pressure applied Laryngoscope Size: Mac and 3 Grade View: Grade IV Tube type: Oral Tube size: 7.5 mm Number of attempts: 1 Airway Equipment and Method: Stylet Placement Confirmation: ETT inserted through vocal cords under direct vision,  breath sounds checked- equal and bilateral and positive ETCO2 Secured at: 22 cm Tube secured with: Tape Dental Injury: Teeth and Oropharynx as per pre-operative assessment

## 2014-08-02 NOTE — Progress Notes (Signed)
Patient ID: [name], male   DOB: Nov 14, 1940, 74 y.o.   MRN: [id number]    Subjective: Pt still with no stool since yesterday.  Feels more bloated today and starting to have a little more pain, but not significant  Objective: Vital signs in last 24 hours: Temp:  [97.7 F (36.5 C)-98.5 F (36.9 C)] 98.4 F (36.9 C) 08/03/22 0430) Pulse Rate:  [64-72] 64 2022-08-03 0430) Resp:  [17-19] 17 08-03-22 0430) BP: (133-145)/(59-68) 133/59 mmHg 2022-08-03 0430) SpO2:  [94 %-96 %] 94 % Aug 03, 2022 0430) Weight:  [83.371 kg (183 lb 12.8 oz)] 83.371 kg (183 lb 12.8 oz) Aug 03, 2022 0430) Last BM Date: 08-03-14  Intake/Output from previous day: 05/06 0701 - 2022/08/03 0700 In: 2355 [P.O.:480; I.V.:1875] Out: 1 [Urine:1] Intake/Output this shift:    PE: Abd: soft, more distended today than yesterday, minimally tender, +BS Heart: regular LungS: CTAB  Lab Results:   Recent Labs  08-03-14 0423  WBC 7.3  HGB 9.0*  HCT 25.9*  PLT 202   BMET  Recent Labs  08/01/14 0820 08-03-2014 0423  NA 134* 130*  K 4.0 3.5  CL 101 99*  CO2 22 20*  GLUCOSE 141* 144*  BUN 18 13  CREATININE 0.82 0.80  CALCIUM 9.0 8.6*   PT/INR No results for input(s): LABPROT, INR in the last 72 hours. CMP     Component Value Date/Time   NA 130* 08/03/2014 0423   K 3.5 2014/08/03 0423   CL 99* 08-03-2014 0423   CO2 20* 08/03/14 0423   GLUCOSE 144* 08/03/14 0423   BUN 13 Aug 03, 2014 0423   CREATININE 0.80 Aug 03, 2014 0423   CALCIUM 8.6* 2014/08/03 0423   PROT 6.9 07/26/2014 0411   ALBUMIN 3.7 07/26/2014 0411   AST 26 07/26/2014 0411   ALT 16 07/26/2014 0411   ALKPHOS 106 07/26/2014 0411   BILITOT 1.1 07/26/2014 0411   GFRNONAA >60 Aug 03, 2014 0423   GFRAA >60 2014/08/03 0423   Lipase     Component Value Date/Time   LIPASE 31 07/25/2014 1828       Studies/Results: Dg Abd 2 Views  08-03-14   CLINICAL DATA:  74 year old male with a history of small bowel obstruction  EXAM: ABDOMEN - 2 VIEW  COMPARISON:   08/01/2014, 07/30/2014, 07/29/2014, CT 07/25/2014  FINDINGS: Multiple borderline dilated small bowel loops within the abdomen on the supine image. Upright image demonstrates air-fluid levels.  Paucity of distal colonic gas.  Surgical changes of prior median sternotomy, as well as surgical clips of the left upper quadrant.  Stones within the gallbladder again visualized in the right upper quadrant.  No displaced fracture.  Vascular calcifications.  IMPRESSION: Evidence of persisting small bowel obstruction, with multiple borderline dilated small bowel loops and paucity of colonic gas.  Signed,  Dulcy Fanny. Earleen Newport, DO  [facility]  [facility]   Electronically Signed   By: Corrie Mckusick D.O.   On: 08-03-2014 07:31   Dg Abd 2 Views  08/01/2014   CLINICAL DATA:  Follow-up of small bowel obstruction.  EXAM: ABDOMEN - 2 VIEW  COMPARISON:  Supine and upright abdominal films of Jul 30, 2014  FINDINGS: There has been interval removal of the esophagogastric tube. There remain loops of mildly distended gas and fluid-filled small bowel in the midline. The volume of gas present has decreased somewhat. There is some gas and fluid within the colon. Previously demonstrated colonic contrast has cleared. A small amount of air is present within the  stomach. No definite extraluminal gas collections are demonstrated.  IMPRESSION: Persistent mid to distal relatively high-grade small bowel obstruction. Interval removal of the esophagogastric tube.  If the patient's clinical status has deteriorated, abdominal and pelvic CT scanning now would be useful.   Electronically Signed   By: Raheel  Martinique M.D.   On: 08/01/2014 10:38    Anti-infectives: Anti-infectives    Start     Dose/Rate Route Frequency Ordered Stop   07/30/14 0800  ceFAZolin (ANCEF) IVPB 2 g/50 mL premix     2 g 100 mL/hr over 30 Minutes Intravenous To Surgery 07/30/14 0746 07/31/14 0800   07/25/14 2245  cefTRIAXone  (ROCEPHIN) 1 g in dextrose 5 % 50 mL IVPB     1 g 100 mL/hr over 30 Minutes Intravenous Every 24 hours 07/25/14 2234     07/25/14 2245  azithromycin (ZITHROMAX) 500 mg in dextrose 5 % 250 mL IVPB     500 mg 250 mL/hr over 60 Minutes Intravenous Every 24 hours 07/25/14 2234         Assessment/Plan  PSBO -patient with more distention today, but still soft.  His plain films still show an impressive amount of SB dilatation.  The patient has failed to progress.  I think he needs an operation.  I will have to d/w Dr. Georgette Dover about timing of whether this can be done today or not.  Remain NPO for possible OR and in general for obstruction.   LOS: 8 days    Lan Entsminger E 08/02/2014, 10:28 AM Pager: [phone]

## 2014-08-02 NOTE — Anesthesia Preprocedure Evaluation (Addendum)
Anesthesia Evaluation  Patient identified by MRN, date of birth, ID band Patient awake    Reviewed: Allergy & Precautions, Patient's Chart, lab work & pertinent test results  Airway Mallampati: II  TM Distance: >3 FB Neck ROM: Full    Dental  (+) Teeth Intact, Dental Advisory Given   Pulmonary former smoker,    Pulmonary exam normal       Cardiovascular hypertension, Pt. on medications + CAD and + CABG Normal cardiovascular exam    Neuro/Psych    GI/Hepatic   Endo/Other  diabetes, Type 2, Oral Hypoglycemic Agents  Renal/GU      Musculoskeletal   Abdominal   Peds  Hematology   Anesthesia Other Findings   Reproductive/Obstetrics                            Anesthesia Physical Anesthesia Plan  ASA: III and emergent  Anesthesia Plan: General   Post-op Pain Management:    Induction: Intravenous  Airway Management Planned: Oral ETT  Additional Equipment:   Intra-op Plan:   Post-operative Plan: Extubation in OR  Informed Consent: I have reviewed the patients History and Physical, chart, labs and discussed the procedure including the risks, benefits and alternatives for the proposed anesthesia with the patient or authorized representative who has indicated his/her understanding and acceptance.     Plan Discussed with: CRNA and Surgeon  Anesthesia Plan Comments:         Anesthesia Quick Evaluation

## 2014-08-03 LAB — GLUCOSE, CAPILLARY
GLUCOSE-CAPILLARY: 127 mg/dL — AB (ref 70–99)
GLUCOSE-CAPILLARY: 127 mg/dL — AB (ref 70–99)
Glucose-Capillary: 101 mg/dL — ABNORMAL HIGH (ref 70–99)
Glucose-Capillary: 127 mg/dL — ABNORMAL HIGH (ref 70–99)
Glucose-Capillary: 151 mg/dL — ABNORMAL HIGH (ref 70–99)
Glucose-Capillary: 159 mg/dL — ABNORMAL HIGH (ref 70–99)

## 2014-08-03 LAB — BASIC METABOLIC PANEL
Anion gap: 9 (ref 5–15)
BUN: 10 mg/dL (ref 6–20)
CHLORIDE: 99 mmol/L — AB (ref 101–111)
CO2: 23 mmol/L (ref 22–32)
Calcium: 8.3 mg/dL — ABNORMAL LOW (ref 8.9–10.3)
Creatinine, Ser: 0.78 mg/dL (ref 0.61–1.24)
Glucose, Bld: 171 mg/dL — ABNORMAL HIGH (ref 70–99)
Potassium: 3.5 mmol/L (ref 3.5–5.1)
Sodium: 131 mmol/L — ABNORMAL LOW (ref 135–145)

## 2014-08-03 MED ORDER — DEXTROSE 5 % IV SOLN
1.0000 g | INTRAVENOUS | Status: AC
  Administered 2014-08-03: 1 g via INTRAVENOUS
  Filled 2014-08-03: qty 10

## 2014-08-03 MED ORDER — CHLORHEXIDINE GLUCONATE 0.12 % MT SOLN
15.0000 mL | Freq: Two times a day (BID) | OROMUCOSAL | Status: DC
  Administered 2014-08-03 – 2014-08-04 (×4): 15 mL via OROMUCOSAL
  Filled 2014-08-03 (×4): qty 15

## 2014-08-03 MED ORDER — CETYLPYRIDINIUM CHLORIDE 0.05 % MT LIQD
7.0000 mL | Freq: Two times a day (BID) | OROMUCOSAL | Status: DC
  Administered 2014-08-03 – 2014-08-04 (×3): 7 mL via OROMUCOSAL

## 2014-08-03 MED ORDER — AZITHROMYCIN 500 MG IV SOLR
500.0000 mg | INTRAVENOUS | Status: AC
  Administered 2014-08-03: 500 mg via INTRAVENOUS
  Filled 2014-08-03: qty 500

## 2014-08-03 NOTE — Progress Notes (Signed)
[facility]                                                                              Patient Demographics  [name], is a 74 y.o. male, DOB - 09-21-1940, IZT:[id number]  Admit date - 07/25/2014   Admitting Physician Kyle Gravel, MD  Outpatient Primary MD for the patient is  Kyle Crutch, MD  LOS - 9   Chief Complaint  Patient presents with  . Abdominal Pain       Brief HPI   Patient is a 74 year old male with CAD, diabetes type 2, hypertension, hyperlipidemia presented with abdominal pain and described as periumbilical, sharp with nausea and vomiting. He denied any fevers or chills, diarrhea or melena or hematochezia. Patient underwent CT of the abdomen and pelvis which showed partial small bowel traction. Patient was admitted for further workup.  Assessment & Plan    Principal Problem:   SBO (small bowel obstruction)- postop day #1 - Status post exploratory laparoscopy with lysis of adhesions, patient feeling slightly better today, states passing flatus - Continue management per surgery recommendations  Active Problems:   CAD (coronary artery disease): - Currently stable, no chest pain or shortness of breath    Diabetes mellitus - Hemoglobin A1c 6.4, continue sliding scale insulin    HTN (hypertension) - Well-controlled, continue Norvasc, Coreg  Questionable left pleural effusion on CT abdomen, CAP on CXR.  - No coughing or respiratory symptoms, afebrile. Chest x-ray showed patchy infiltrates in the upper lobe and left lower lobe - Does not need any further antibiotics, on day #8, DC antibiotics after today's dose -  repeat chest x-ray in 4-6 weeks to ensure complete resolution of pneumonia.  Normocytic anemia - Currently stable  Code Status: Full code   Family Communication: Discussed in detail with the patient, all imaging results, lab results explained to the patient and and wife at bedside  Disposition Plan: not medically ready  Time  Spent in minutes  25 minutes  Procedures  abd Xray CT abd  Consults   Surgery  DVT Prophylaxis  SCD's  Medications  Scheduled Meds: . amLODipine  5 mg Oral Daily  . antiseptic oral rinse  7 mL Mouth Rinse q12n4p  . aspirin EC  81 mg Oral Daily  . azithromycin  500 mg Intravenous Q24H  . carvedilol  12.5 mg Oral BID WC  . cefTRIAXone (ROCEPHIN)  IV  1 g Intravenous Q24H  . chlorhexidine  15 mL Mouth Rinse BID  . enoxaparin (LOVENOX) injection  40 mg Subcutaneous Q24H  . insulin aspart  0-9 Units Subcutaneous Q4H  . losartan  100 mg Oral Daily  . morphine   Intravenous 6 times per day  . simvastatin  20 mg Oral QHS   Continuous Infusions: . sodium chloride 100 mL/hr at 08/03/14 0327   PRN Meds:.acetaminophen **OR** acetaminophen, ALPRAZolam, diphenhydrAMINE **OR** diphenhydrAMINE, naloxone **AND** sodium chloride, nitroGLYCERIN, ondansetron (ZOFRAN) IV, ondansetron (ZOFRAN) IV   Antibiotics   Anti-infectives    Start     Dose/Rate Route Frequency Ordered Stop   08/02/14 1550  ceFAZolin (ANCEF) 2-3 GM-% IVPB SOLR    Comments:  Marinda Elk   : cabinet override      08/02/14 1550 08/02/14 1615   07/30/14 0800  ceFAZolin (ANCEF) IVPB 2 g/50 mL premix     2 g 100 mL/hr over 30 Minutes Intravenous To Surgery 07/30/14 0746 07/31/14 0800   07/25/14 2245  cefTRIAXone (ROCEPHIN) 1 g in dextrose 5 % 50 mL IVPB     1 g 100 mL/hr over 30 Minutes Intravenous Every 24 hours 07/25/14 2234     07/25/14 2245  azithromycin (ZITHROMAX) 500 mg in dextrose 5 % 250 mL IVPB     500 mg 250 mL/hr over 60 Minutes Intravenous Every 24 hours 07/25/14 2234          Subjective:   [name] was seen and examined today. Postop, feels stable, states passing gas. No BM. Denies any chest pain, shortness of breath, new weakness or any numbness or tingling. Afebrile   Objective:   Blood pressure 140/60, pulse 78, temperature 99 F (37.2 C), temperature source Oral, resp. rate 17, height 5'  9.5" (1.765 m), weight 88.497 kg (195 lb 1.6 oz), SpO2 96 %.  Wt Readings from Last 3 Encounters:  08/03/14 88.497 kg (195 lb 1.6 oz)  01/24/14 81.375 kg (179 lb 6.4 oz)  05/27/13 81.194 kg (179 lb)     Intake/Output Summary (Last 24 hours) at 08/03/14 0926 Last data filed at 08/03/14 0600  Gross per 24 hour  Intake   1930 ml  Output    550 ml  Net   1380 ml    Exam  General: Alert and oriented x 3, NAD  HEENT:  PERRLA, EOMI  Neck: Supple, no JVD  CVS: S1 S2 clear, no mrg  Respiratory: CTA B  Abdomen: Soft, dressing intact  Ext: no c/c/e bilateral lower extremities  Neuro: no new focal neurological deficits  Skin: No rashes  Psych: Alert and oriented 3   Data Review   Micro Results Recent Results (from the past 240 hour(s))  Surgical pcr screen     Status: None   Collection Time: 07/30/14  8:18 AM  Result Value Ref Range Status   MRSA, PCR NEGATIVE NEGATIVE Final   Staphylococcus aureus NEGATIVE NEGATIVE Final    Comment:        The Xpert SA Assay (FDA approved for NASAL specimens in patients over 62 years of age), is one component of a comprehensive surveillance program.  Test performance has been validated by [facility] for patients greater than or equal to 58 year old. It is not intended to diagnose infection nor to guide or monitor treatment.   MRSA PCR Screening     Status: None   Collection Time: 08/02/14  2:32 PM  Result Value Ref Range Status   MRSA by PCR NEGATIVE NEGATIVE Final    Comment:        The GeneXpert MRSA Assay (FDA approved for NASAL specimens only), is one component of a comprehensive MRSA colonization surveillance program. It is not intended to diagnose MRSA infection nor to guide or monitor treatment for MRSA infections.     Radiology Reports Dg Chest 2 View  07/26/2014   CLINICAL DATA:  Pleural effusion.  EXAM: CHEST  2 VIEW  COMPARISON:  02/17/2012  FINDINGS: Patient's nasogastric tube in place, tip coiled  back upon itself into the lower esophagus. Status post median sternotomy and CABG. Heart is enlarged. There is mild interstitial edema. There patchy infiltrates within the left upper lobe and left lower lobe. Left  pleural effusion. Persistent wedge compression of T10, unchanged. Mild dilatation of small bowel loops, unchanged.  IMPRESSION: 1. Cardiomegaly and mild edema. 2. Left upper lobe and left lower lobe infiltrates associated with left pleural effusion. 3. Nasogastric tube coiled back upon itself into lower esophagus. 4. The salient findings were discussed with Lordis on 07/26/2014 at 8:04 pm.   Electronically Signed   By: Nolon Nations M.D.   On: 07/26/2014 20:04   Ct Abdomen Pelvis W Contrast  07/25/2014   CLINICAL DATA:  Mid to lower abdominal pain since 12 hours ago. Vomiting.  EXAM: CT ABDOMEN AND PELVIS WITH CONTRAST  TECHNIQUE: Multidetector CT imaging of the abdomen and pelvis was performed using the standard protocol following bolus administration of intravenous contrast.  CONTRAST:  162mL OMNIPAQUE IOHEXOL 300 MG/ML  SOLN  COMPARISON:  None.  FINDINGS: There is a left effusion layering dependently with patchy density in the dependent left lung that could be atelectasis or pneumonia.  The liver shows a pattern raising the possibility of early cirrhosis. There are 2 benign calcifications in the left lobe. There are calcified stones in the gallbladder neck. No stone is seen along the course of the common duct. The spleen is normal. There are bilateral low-density adrenal adenomas. The kidneys are normal. The aorta and its branch vessels show extensive atherosclerotic disease. No aneurysm. Bladder, prostate gland and seminal vesicles are unremarkable.  There are dilated fluid and air-filled loops of small intestine consistent with partial small bowel obstruction. There is a caliber change at the mid ileum. Specific obstructing lesion is not identified. There is free intraperitoneal fluid but no free  intraperitoneal air are identified. Old vertebral augmentation at T10. Chronic bilateral pars defects at L5.  IMPRESSION: Partial small bowel obstruction. Level of the obstruction is probably mid ileum. Specific etiology not demonstrated.  Free fluid which could relate to the small bowel obstruction or could be secondary to cirrhosis. The patient does appear to have cirrhosis of the liver.  Gallstones.  Advanced widespread atherosclerosis.  Left pleural effusion. Atelectasis and/or pneumonia in the dependent left lower lobe.   Electronically Signed   By: Nelson Chimes M.D.   On: 07/25/2014 21:28   Dg Abd 2 Views  08/02/2014   CLINICAL DATA:  74 year old male with a history of small bowel obstruction  EXAM: ABDOMEN - 2 VIEW  COMPARISON:  08/01/2014, 07/30/2014, 07/29/2014, CT 07/25/2014  FINDINGS: Multiple borderline dilated small bowel loops within the abdomen on the supine image. Upright image demonstrates air-fluid levels.  Paucity of distal colonic gas.  Surgical changes of prior median sternotomy, as well as surgical clips of the left upper quadrant.  Stones within the gallbladder again visualized in the right upper quadrant.  No displaced fracture.  Vascular calcifications.  IMPRESSION: Evidence of persisting small bowel obstruction, with multiple borderline dilated small bowel loops and paucity of colonic gas.  Signed,  Dulcy Fanny. Earleen Newport, DO  [facility]  [facility]   Electronically Signed   By: Corrie Mckusick D.O.   On: 08/02/2014 07:31   Dg Abd 2 Views  08/01/2014   CLINICAL DATA:  Follow-up of small bowel obstruction.  EXAM: ABDOMEN - 2 VIEW  COMPARISON:  Supine and upright abdominal films of Jul 30, 2014  FINDINGS: There has been interval removal of the esophagogastric tube. There remain loops of mildly distended gas and fluid-filled small bowel in the midline. The volume of gas present has decreased somewhat. There is some gas and  fluid within the colon.  Previously demonstrated colonic contrast has cleared. A small amount of air is present within the stomach. No definite extraluminal gas collections are demonstrated.  IMPRESSION: Persistent mid to distal relatively high-grade small bowel obstruction. Interval removal of the esophagogastric tube.  If the patient's clinical status has deteriorated, abdominal and pelvic CT scanning now would be useful.   Electronically Signed   By: Ch[place]ike  Martinique M.D.   On: 08/01/2014 10:38   Dg Abd 2 Views  07/30/2014   CLINICAL DATA:  Small-bowel obstruction.  EXAM: ABDOMEN - 2 VIEW  COMPARISON:  07/29/2014 .  FINDINGS: NG tube in stable position. Soft tissue structures are unremarkable. Persistent dilated loops of small bowel are again noted consistent with small bowel obstruction. Oral contrast again noted colon. No free air. Prior median sternotomy. Surgical clips left upper quadrant.  IMPRESSION: Persistent unchanged dilated loops of small bowel consistent with small bowel obstruction. Oral contrast is noted in the colon. No free air. NG tube in stable position.   Electronically Signed   By: Marcello Moores  Register   On: 07/30/2014 07:11   Dg Abd 2 Views  07/28/2014   CLINICAL DATA:  Ileus, nausea.  EXAM: ABDOMEN - 2 VIEW  COMPARISON:  07/27/2014  FINDINGS: NG tube is present in the stomach. There are dilated small bowel loops with air-fluid levels. Degree of small bowel dilatation has increased since prior study. Colon is decompressed. No free air organomegaly. No suspicious calcification. Vascular calcifications noted in the aorta and iliac vessels. No acute bony abnormality.  IMPRESSION: Worsening small bowel dilatation compatible with worsening small bowel obstruction.   Electronically Signed   By: Rolm Baptise M.D.   On: 07/28/2014 08:57   Dg Abd Portable 1v  07/29/2014   CLINICAL DATA:  24 hour delay with Gastrografin. Small bowel protocol.  EXAM: PORTABLE ABDOMEN - 1 VIEW  COMPARISON:  07/28/2014  FINDINGS: Persistent  gaseous distended loops of small bowel are demonstrated within the central abdomen measuring up to 3.5 cm, compatible with small bowel obstruction. Oral contrast material is demonstrated to the right lower quadrant, within the cecum and proximal ascending colon. NG tube projects over the left upper quadrant. Left upper quadrant surgical clips. Lower lumbar spine degenerative changes.  IMPRESSION: Oral contrast material demonstrated within the right lower quadrant, likely within the cecum and ascending colon.  Persistent gaseous distended loops of small bowel, compatible with obstruction.   Electronically Signed   By: Lovey Newcomer M.D.   On: 07/29/2014 13:40   Dg Abd Portable 1v-small Bowel Obstruction Protocol-initial, 8 Hr Delay  07/29/2014   CLINICAL DATA:  Small bowel obstruction  EXAM: PORTABLE ABDOMEN - 1 VIEW  COMPARISON:  07/28/2014  FINDINGS: The nasogastric tube extends into the proximal stomach with the side-port in the region of the EG junction.  There is persistent dilatation and stacking of small bowel loops consistent with small bowel obstruction. No free air is evident. No interval change is evident.  IMPRESSION: Persistent small bowel obstruction.   Electronically Signed   By: Andreas Newport M.D.   On: 07/29/2014 02:27   Dg Abd Portable 1v  07/27/2014   CLINICAL DATA:  Small-bowel obstruction.  EXAM: PORTABLE ABDOMEN - 1 VIEW  COMPARISON:  07/25/2014  FINDINGS: Nasogastric tube is partially imaged, tip overlying the level of the stomach. There are persistent dilated small bowel loops in the central abdomen. Nondilated loops of large bowel persist. There is a small amount of residual contrast in the  bladder.  IMPRESSION: 1. Persistent small bowel dilatation. 2. Residual contrast in the bladder.   Electronically Signed   By: Nolon Nations M.D.   On: 07/27/2014 09:10   Dg Abd Portable 1v  07/25/2014   CLINICAL DATA:  Abdominal pain.  Nausea and vomiting for 1 day.  EXAM: PORTABLE ABDOMEN - 1  VIEW  COMPARISON:  CT of earlier in the day  FINDINGS: Single supine portable view. Contrast within the urinary bladder. Gastric distension with possible nasogastric tube, incompletely imaged. Proximal small bowel loops measure up to 3.2 cm. Gas and stool within the colon. Advanced atherosclerosis.  IMPRESSION: Partial small bowel obstruction, without free intraperitoneal air or other acute complication.   Electronically Signed   By: Abigail Miyamoto M.D.   On: 07/25/2014 23:46    CBC  Recent Labs Lab 07/28/14 0400 08/02/14 0423  WBC 7.8 7.3  HGB 9.0* 9.0*  HCT 25.8* 25.9*  PLT 148* 202  MCV 89.9 88.7  MCH 31.4 30.8  MCHC 34.9 34.7  RDW 15.4 14.5  LYMPHSABS 0.9  --   MONOABS 0.9  --   EOSABS 0.1  --   BASOSABS 0.0  --     Chemistries   Recent Labs Lab 07/29/14 1450  07/30/14 0420 07/31/14 0353 08/01/14 0409 08/01/14 0820 08/02/14 0423 08/03/14 0412  NA  --   < > 132* 132*  --  134* 130* 131*  K  --   < > 3.3* 3.3*  --  4.0 3.5 3.5  CL  --   < > 99* 100*  --  101 99* 99*  CO2  --   < > 22 21*  --  22 20* 23  GLUCOSE  --   < > 150* 162*  --  141* 144* 171*  BUN  --   < > 19 18  --  18 13 10   CREATININE  --   < > 0.74 0.80 0.80 0.82 0.80 0.78  CALCIUM  --   < > 8.6* 8.8*  --  9.0 8.6* 8.3*  MG 1.9  --   --   --   --   --   --   --   < > = values in this interval not displayed. ------------------------------------------------------------------------------------------------------------------ estimated creatinine clearance is 91.3 mL/min (by C-G formula based on Cr of 0.78). ------------------------------------------------------------------------------------------------------------------ No results for input(s): HGBA1C in the last 72 hours. ------------------------------------------------------------------------------------------------------------------ No results for input(s): CHOL, HDL, LDLCALC, TRIG, CHOLHDL, LDLDIRECT in the last 72  hours. ------------------------------------------------------------------------------------------------------------------ No results for input(s): TSH, T4TOTAL, T3FREE, THYROIDAB in the last 72 hours.  Invalid input(s): FREET3 ------------------------------------------------------------------------------------------------------------------ No results for input(s): VITAMINB12, FOLATE, FERRITIN, TIBC, IRON, RETICCTPCT in the last 72 hours.  Coagulation profile No results for input(s): INR, PROTIME in the last 168 hours.  No results for input(s): DDIMER in the last 72 hours.  Cardiac Enzymes No results for input(s): CKMB, TROPONINI, MYOGLOBIN in the last 168 hours.  Invalid input(s): CK ------------------------------------------------------------------------------------------------------------------ Invalid input(s): [place]  08/02/14 1212 08/02/14 1708 08/02/14 1935 08/02/14 2347 08/03/14 0409 08/03/14 0740  GLUCAP 135* 120* 134* 151* 151* 159*     Layne Lebon M.D. [facility] 08/03/2014, 9:26 AM  Pager: [phone]   Between 7am to 7pm - call Pager - [phone]  After 7pm go to [email or web address] - password [email or web address]  Call night coverage person covering after 7pm

## 2014-08-03 NOTE — Progress Notes (Signed)
Patient ID: KYRUS HYDE, male   DOB: 09-Sep-1940, 74 y.o.   MRN: [id number] 1 Day Post-Op  Subjective: Pt looks good today.  Couldn't void overnight.  I&O cath x1.  Hasn't voided since.  No nausea.  No flatus yet  Objective: Vital signs in last 24 hours: Temp:  [97.5 F (36.4 C)-99 F (37.2 C)] 99 F (37.2 C) (05/08 0905) Pulse Rate:  [64-79] 78 (05/08 0905) Resp:  [8-21] 17 (05/08 0905) BP: (125-163)/(57-99) 140/60 mmHg (05/08 0905) SpO2:  [88 %-99 %] 96 % (05/08 0905) Weight:  [88.497 kg (195 lb 1.6 oz)] 88.497 kg (195 lb 1.6 oz) (05/08 0447) Last BM Date: 08/23/2014  Intake/Output from previous day: Aug 23, 2022 0701 - 05/08 0700 In: 1930 [I.V.:1930] Out: 550 [Urine:530; Blood:20] Intake/Output this shift:    PE: Abd: soft, some distention, but few BS, incision is covered with only minimal drainage on bandage. Heart: regular Lungs: CTAB  Lab Results:   Recent Labs  08/23/14 0423  WBC 7.3  HGB 9.0*  HCT 25.9*  PLT 202   BMET  Recent Labs  Aug 23, 2014 0423 08/03/14 0412  NA 130* 131*  K 3.5 3.5  CL 99* 99*  CO2 20* 23  GLUCOSE 144* 171*  BUN 13 10  CREATININE 0.80 0.78  CALCIUM 8.6* 8.3*   PT/INR No results for input(s): LABPROT, INR in the last 72 hours. CMP     Component Value Date/Time   NA 131* 08/03/2014 0412   K 3.5 08/03/2014 0412   CL 99* 08/03/2014 0412   CO2 23 08/03/2014 0412   GLUCOSE 171* 08/03/2014 0412   BUN 10 08/03/2014 0412   CREATININE 0.78 08/03/2014 0412   CALCIUM 8.3* 08/03/2014 0412   PROT 6.9 07/26/2014 0411   ALBUMIN 3.7 07/26/2014 0411   AST 26 07/26/2014 0411   ALT 16 07/26/2014 0411   ALKPHOS 106 07/26/2014 0411   BILITOT 1.1 07/26/2014 0411   GFRNONAA >60 08/03/2014 0412   GFRAA >60 08/03/2014 0412   Lipase     Component Value Date/Time   LIPASE 31 07/25/2014 1828       Studies/Results: Dg Abd 2 Views  08-23-2014   CLINICAL DATA:  74 year old male with a history of small bowel obstruction  EXAM: ABDOMEN - 2 VIEW   COMPARISON:  08/01/2014, 07/30/2014, 07/29/2014, CT 07/25/2014  FINDINGS: Multiple borderline dilated small bowel loops within the abdomen on the supine image. Upright image demonstrates air-fluid levels.  Paucity of distal colonic gas.  Surgical changes of prior median sternotomy, as well as surgical clips of the left upper quadrant.  Stones within the gallbladder again visualized in the right upper quadrant.  No displaced fracture.  Vascular calcifications.  IMPRESSION: Evidence of persisting small bowel obstruction, with multiple borderline dilated small bowel loops and paucity of colonic gas.  Signed,  Dulcy Fanny. Earleen Newport, DO  [facility]  [facility]   Electronically Signed   By: Corrie Mckusick D.O.   On: 08/23/14 07:31   Dg Abd 2 Views  08/01/2014   CLINICAL DATA:  Follow-up of small bowel obstruction.  EXAM: ABDOMEN - 2 VIEW  COMPARISON:  Supine and upright abdominal films of Jul 30, 2014  FINDINGS: There has been interval removal of the esophagogastric tube. There remain loops of mildly distended gas and fluid-filled small bowel in the midline. The volume of gas present has decreased somewhat. There is some gas and fluid within the colon. Previously demonstrated colonic contrast has cleared. A small  amount of air is present within the stomach. No definite extraluminal gas collections are demonstrated.  IMPRESSION: Persistent mid to distal relatively high-grade small bowel obstruction. Interval removal of the esophagogastric tube.  If the patient's clinical status has deteriorated, abdominal and pelvic CT scanning now would be useful.   Electronically Signed   By: Jaythen  Martinique M.D.   On: 08/01/2014 10:38    Anti-infectives: Anti-infectives    Start     Dose/Rate Route Frequency Ordered Stop   08/03/14 2200  azithromycin (ZITHROMAX) 500 mg in dextrose 5 % 250 mL IVPB     500 mg 250 mL/hr over 60 Minutes Intravenous Every 24 hours 08/03/14 0928 08/04/14 2159    08/03/14 2200  cefTRIAXone (ROCEPHIN) 1 g in dextrose 5 % 50 mL IVPB     1 g 100 mL/hr over 30 Minutes Intravenous Every 24 hours 08/03/14 0928 08/04/14 2159   08/02/14 1550  ceFAZolin (ANCEF) 2-3 GM-% IVPB SOLR    Comments:  Marinda Elk   : cabinet override      08/02/14 1550 08/02/14 1615   07/30/14 0800  ceFAZolin (ANCEF) IVPB 2 g/50 mL premix     2 g 100 mL/hr over 30 Minutes Intravenous To Surgery 07/30/14 0746 07/31/14 0800   07/25/14 2245  cefTRIAXone (ROCEPHIN) 1 g in dextrose 5 % 50 mL IVPB  Status:  Discontinued     1 g 100 mL/hr over 30 Minutes Intravenous Every 24 hours 07/25/14 2234 08/03/14 0928   07/25/14 2245  azithromycin (ZITHROMAX) 500 mg in dextrose 5 % 250 mL IVPB  Status:  Discontinued     500 mg 250 mL/hr over 60 Minutes Intravenous Every 24 hours 07/25/14 2234 08/03/14 0928       Assessment/Plan  POD1, s/p ex lap with LOA for single band adhesion, Dr. Georgette Dover 08-02-14 -cont NPO x ice today.  Hopefully can advance to clears tomorrow -mobilize and pulm toilet  DVT prophylaxis -lovenox/SCDs  LOS: 9 days    Lillyian Heidt E 08/03/2014, 10:04 AM Pager: [phone]

## 2014-08-04 ENCOUNTER — Encounter (HOSPITAL_COMMUNITY): Payer: Self-pay | Admitting: Surgery

## 2014-08-04 LAB — BASIC METABOLIC PANEL
ANION GAP: 9 (ref 5–15)
BUN: 11 mg/dL (ref 6–20)
CO2: 23 mmol/L (ref 22–32)
Calcium: 8.3 mg/dL — ABNORMAL LOW (ref 8.9–10.3)
Chloride: 99 mmol/L — ABNORMAL LOW (ref 101–111)
Creatinine, Ser: 0.79 mg/dL (ref 0.61–1.24)
GLUCOSE: 143 mg/dL — AB (ref 70–99)
POTASSIUM: 3.3 mmol/L — AB (ref 3.5–5.1)
Sodium: 131 mmol/L — ABNORMAL LOW (ref 135–145)

## 2014-08-04 LAB — GLUCOSE, CAPILLARY
GLUCOSE-CAPILLARY: 144 mg/dL — AB (ref 70–99)
GLUCOSE-CAPILLARY: 118 mg/dL — AB (ref 70–99)
GLUCOSE-CAPILLARY: 138 mg/dL — AB (ref 70–99)
Glucose-Capillary: 144 mg/dL — ABNORMAL HIGH (ref 70–99)
Glucose-Capillary: 187 mg/dL — ABNORMAL HIGH (ref 70–99)
Glucose-Capillary: 187 mg/dL — ABNORMAL HIGH (ref 70–99)

## 2014-08-04 MED ORDER — FAT EMULSION 20 % IV EMUL
250.0000 mL | INTRAVENOUS | Status: AC
  Administered 2014-08-04: 250 mL via INTRAVENOUS
  Filled 2014-08-04: qty 250

## 2014-08-04 MED ORDER — POTASSIUM CHLORIDE 10 MEQ/100ML IV SOLN
10.0000 meq | INTRAVENOUS | Status: AC
Start: 2014-08-04 — End: 2014-08-04
  Administered 2014-08-04 (×3): 10 meq via INTRAVENOUS
  Filled 2014-08-04: qty 100

## 2014-08-04 MED ORDER — SODIUM CHLORIDE 0.9 % IV SOLN
INTRAVENOUS | Status: DC
  Administered 2014-08-05: 08:00:00 via INTRAVENOUS

## 2014-08-04 MED ORDER — TRACE MINERALS CR-CU-F-FE-I-MN-MO-SE-ZN IV SOLN
INTRAVENOUS | Status: AC
  Administered 2014-08-04: 17:00:00 via INTRAVENOUS
  Filled 2014-08-04: qty 960

## 2014-08-04 MED ORDER — SODIUM CHLORIDE 0.9 % IJ SOLN
10.0000 mL | INTRAMUSCULAR | Status: DC | PRN
  Administered 2014-08-04: 20 mL
  Administered 2014-08-05 – 2014-08-08 (×4): 10 mL
  Administered 2014-08-08: 20 mL
  Administered 2014-08-08: 10 mL
  Filled 2014-08-04 (×7): qty 40

## 2014-08-04 NOTE — Progress Notes (Signed)
Initial Nutrition Assessment  DOCUMENTATION CODES:  Not applicable  INTERVENTION:  TPN, Other (Comment) (RD to follow for diet advancement)  NUTRITION DIAGNOSIS:  Inadequate oral intake related to inability to eat as evidenced by NPO status.  GOAL:  Patient will meet greater than or equal to 90% of their needs   MONITOR:  PO intake, Weight trends, Labs, Diet advancement, Skin, I & O's, Other (Comment) (TPN adequacy/tolerance)  REASON FOR ASSESSMENT:  Consult, Other (Comment) (NPO/clear liquids >7 days) New TPN/TNA  ASSESSMENT: Patient is a 74 year old male with CAD, diabetes type 2, hypertension, hyperlipidemia presented with abdominal pain and described as periumbilical, sharp with nausea and vomiting. He denied any fevers or chills, diarrhea or melena or hematochezia. Patient underwent CT of the abdomen and pelvis which showed partial small bowel traction. Patient was admitted for further workup.  Pt admitted with SBO. S/p ex lap with lysis of adhesions on 08/02/14.  Pt has been NPO/clear liquids x 10 days.  Pt asleep at time of visit. No signs of fat or muscle depletion noted. Wt hx reveals UBW of 180#. No weight loss per wt hx.  Per pharmacy notes, TPN will be initiated today at 1800. Plan is to start TPN with Clinimix E 5/15 at 40 ml/hr plus IVFE at 10 ml/hr and assess tolerance. This will provide 682 kcals and 48 grams protein, which will meet 46% of estimated kcal needs and 48% of estimated protein needs.  Labs reviewed. Na: 131, K: 3.3, Cl: 99, Calcium: 8.3, Glucose: 143, CBGS: 118-144.   Height:  Ht Readings from Last 1 Encounters:  07/25/14 5' 9.5" (1.765 m)    Weight:  Wt Readings from Last 1 Encounters:  08/03/14 195 lb 1.6 oz (88.497 kg)    Ideal Body Weight:  74 kg  Wt Readings from Last 10 Encounters:  08/03/14 195 lb 1.6 oz (88.497 kg)  01/24/14 179 lb 6.4 oz (81.375 kg)  05/27/13 179 lb (81.194 kg)  11/15/12 180 lb 3.2 oz (81.738 kg)  04/19/12  173 lb 3.2 oz (78.563 kg)  08/18/11 178 lb (80.74 kg)  07/20/11 180 lb 4.8 oz (81.784 kg)  02/23/11 188 lb 12.8 oz (85.639 kg)    BMI:  Body mass index is 28.41 kg/(m^2).  Estimated Nutritional Needs:  Kcal:  2000-2200  Protein:  105-115 grams  Fluid:  2.0-2.2 L  Skin:  Reviewed, no issues (closed abdominal incision)  Diet Order:  Diet NPO time specified Except for: Sips with Meds, Ice Chips TPN (CLINIMIX-E) Adult  EDUCATION NEEDS:  Education needs addressed   Intake/Output Summary (Last 24 hours) at 08/04/14 1206 Last data filed at 08/04/14 0600  Gross per 24 hour  Intake   2460 ml  Output   1000 ml  Net   1460 ml    Last BM:  08/02/14  Tajha Sammarco A. Jimmye Norman, RD, LDN, CDE Pager: [phone] After hours Pager: [phone]

## 2014-08-04 NOTE — Progress Notes (Signed)
[facility]                                                                              Patient Demographics  [name], is a 74 y.o. male, DOB - [birth year], PPJ:[id number]  Admit date - 07/25/2014   Admitting Physician Jani Gravel, MD  Outpatient Primary MD for the patient is  Melinda Crutch, MD  LOS - 10   Chief Complaint  Patient presents with  . Abdominal Pain       Brief HPI   Patient is a 74 year old male with CAD, diabetes type 2, hypertension, hyperlipidemia presented with abdominal pain and described as periumbilical, sharp with nausea and vomiting. He denied any fevers or chills, diarrhea or melena or hematochezia. Patient underwent CT of the abdomen and pelvis which showed partial small bowel traction. Patient was admitted for further workup.  Assessment & Plan    Principal Problem:   SBO (small bowel obstruction)- postop day #2 - Status post exploratory laparoscopy with lysis of adhesions, still feels bloated - Continue management per surgery recommendations - Place PICC line today, start TNA for nutrition, patient has been nothing by mouth or clear liquid diet for most part of his hospitalization  Active Problems:   CAD (coronary artery disease): - Currently stable, no chest pain or shortness of breath    Diabetes mellitus - Hemoglobin A1c 6.4, continue sliding scale insulin    HTN (hypertension) - Well-controlled, continue Norvasc, Coreg  Questionable left pleural effusion on CT abdomen, CAP on CXR.  - No coughing or respiratory symptoms, afebrile. Chest x-ray showed patchy infiltrates in the upper lobe and left lower lobe - Does not need any further antibiotics, on day #8, completed antibiotics for Zithromax/Rocephin -  repeat chest x-ray in 4-6 weeks to ensure complete resolution of pneumonia.  Normocytic anemia - Currently stable  Code Status: Full code   Family Communication: Discussed in detail with the patient, all imaging  results, lab results explained to the patient and and wife at bedside  Disposition Plan: not medically ready  Time Spent in minutes  25 minutes  Procedures  abd Xray CT abd  Consults   Surgery  DVT Prophylaxis  SCD's  Medications  Scheduled Meds: . amLODipine  5 mg Oral Daily  . antiseptic oral rinse  7 mL Mouth Rinse q12n4p  . aspirin EC  81 mg Oral Daily  . carvedilol  12.5 mg Oral BID WC  . chlorhexidine  15 mL Mouth Rinse BID  . enoxaparin (LOVENOX) injection  40 mg Subcutaneous Q24H  . insulin aspart  0-9 Units Subcutaneous Q4H  . losartan  100 mg Oral Daily  . morphine   Intravenous 6 times per day  . potassium chloride  10 mEq Intravenous Q1 Hr x 3  . simvastatin  20 mg Oral QHS   Continuous Infusions: . sodium chloride    . Marland KitchenTPN (CLINIMIX-E) Adult     And  . fat emulsion     PRN Meds:.acetaminophen **OR** acetaminophen, ALPRAZolam, diphenhydrAMINE **OR** diphenhydrAMINE, naloxone **AND** sodium chloride, nitroGLYCERIN, ondansetron (ZOFRAN) IV, ondansetron (ZOFRAN) IV   Antibiotics   Anti-infectives  Start     Dose/Rate Route Frequency Ordered Stop   08/03/14 2200  azithromycin (ZITHROMAX) 500 mg in dextrose 5 % 250 mL IVPB     500 mg 250 mL/hr over 60 Minutes Intravenous Every 24 hours 08/03/14 0928 08/03/14 2339   08/03/14 2200  cefTRIAXone (ROCEPHIN) 1 g in dextrose 5 % 50 mL IVPB     1 g 100 mL/hr over 30 Minutes Intravenous Every 24 hours 08/03/14 0928 08/03/14 2308   08/02/14 1550  ceFAZolin (ANCEF) 2-3 GM-% IVPB SOLR    Comments:  Marinda Elk   : cabinet override      08/02/14 1550 08/02/14 1615   07/30/14 0800  ceFAZolin (ANCEF) IVPB 2 g/50 mL premix     2 g 100 mL/hr over 30 Minutes Intravenous To Surgery 07/30/14 0746 07/31/14 0800   07/25/14 2245  cefTRIAXone (ROCEPHIN) 1 g in dextrose 5 % 50 mL IVPB  Status:  Discontinued     1 g 100 mL/hr over 30 Minutes Intravenous Every 24 hours 07/25/14 2234 08/03/14 0928   07/25/14 2245   azithromycin (ZITHROMAX) 500 mg in dextrose 5 % 250 mL IVPB  Status:  Discontinued     500 mg 250 mL/hr over 60 Minutes Intravenous Every 24 hours 07/25/14 2234 08/03/14 2426        Subjective:   [name] was seen and examined today. Postop, feels bloated today, no BM, slight nausea. Denies any chest pain, shortness of breath, new weakness or any numbness or tingling. Afebrile   Objective:   Blood pressure 140/57, pulse 67, temperature 98.2 F (36.8 C), temperature source Oral, resp. rate 18, height 5' 9.5" (1.765 m), weight 88.497 kg (195 lb 1.6 oz), SpO2 97 %.  Wt Readings from Last 3 Encounters:  08/03/14 88.497 kg (195 lb 1.6 oz)  01/24/14 81.375 kg (179 lb 6.4 oz)  05/27/13 81.194 kg (179 lb)     Intake/Output Summary (Last 24 hours) at 08/04/14 1202 Last data filed at 08/04/14 0600  Gross per 24 hour  Intake   2460 ml  Output   1000 ml  Net   1460 ml    Exam  General: Alert and oriented x 3, NAD  HEENT:  PERRLA, EOMI  Neck: Supple, no JVD  CVS: S1 S2 clear, no mrg  Respiratory: Clear to auscultation bilaterally  Abdomen: Soft, dressing intact, hypoactive bowel sounds  Ext: no c/c/e bilateral lower extremities  Neuro: no new focal neurological deficits  Skin: No rashes  Psych: Alert and oriented 3   Data Review   Micro Results Recent Results (from the past 240 hour(s))  Surgical pcr screen     Status: None   Collection Time: 07/30/14  8:18 AM  Result Value Ref Range Status   MRSA, PCR NEGATIVE NEGATIVE Final   Staphylococcus aureus NEGATIVE NEGATIVE Final    Comment:        The Xpert SA Assay (FDA approved for NASAL specimens in patients over 24 years of age), is one component of a comprehensive surveillance program.  Test performance has been validated by [facility] for patients greater than or equal to 42 year old. It is not intended to diagnose infection nor to guide or monitor treatment.   MRSA PCR Screening     Status: None     Collection Time: 08/02/14  2:32 PM  Result Value Ref Range Status   MRSA by PCR NEGATIVE NEGATIVE Final    Comment:  The GeneXpert MRSA Assay (FDA approved for NASAL specimens only), is one component of a comprehensive MRSA colonization surveillance program. It is not intended to diagnose MRSA infection nor to guide or monitor treatment for MRSA infections.     Radiology Reports Dg Chest 2 View  07/26/2014   CLINICAL DATA:  Pleural effusion.  EXAM: CHEST  2 VIEW  COMPARISON:  02/17/2012  FINDINGS: Patient's nasogastric tube in place, tip coiled back upon itself into the lower esophagus. Status post median sternotomy and CABG. Heart is enlarged. There is mild interstitial edema. There patchy infiltrates within the left upper lobe and left lower lobe. Left pleural effusion. Persistent wedge compression of T10, unchanged. Mild dilatation of small bowel loops, unchanged.  IMPRESSION: 1. Cardiomegaly and mild edema. 2. Left upper lobe and left lower lobe infiltrates associated with left pleural effusion. 3. Nasogastric tube coiled back upon itself into lower esophagus. 4. The salient findings were discussed with [place] on 07/26/2014 at 8:04 pm.   Electronically Signed   By: Nolon Nations M.D.   On: 07/26/2014 20:04   Ct Abdomen Pelvis W Contrast  07/25/2014   CLINICAL DATA:  Mid to lower abdominal pain since 12 hours ago. Vomiting.  EXAM: CT ABDOMEN AND PELVIS WITH CONTRAST  TECHNIQUE: Multidetector CT imaging of the abdomen and pelvis was performed using the standard protocol following bolus administration of intravenous contrast.  CONTRAST:  130mL OMNIPAQUE IOHEXOL 300 MG/ML  SOLN  COMPARISON:  None.  FINDINGS: There is a left effusion layering dependently with patchy density in the dependent left lung that could be atelectasis or pneumonia.  The liver shows a pattern raising the possibility of early cirrhosis. There are 2 benign calcifications in the left lobe. There are calcified  stones in the gallbladder neck. No stone is seen along the course of the common duct. The spleen is normal. There are bilateral low-density adrenal adenomas. The kidneys are normal. The aorta and its branch vessels show extensive atherosclerotic disease. No aneurysm. Bladder, prostate gland and seminal vesicles are unremarkable.  There are dilated fluid and air-filled loops of small intestine consistent with partial small bowel obstruction. There is a caliber change at the mid ileum. Specific obstructing lesion is not identified. There is free intraperitoneal fluid but no free intraperitoneal air are identified. Old vertebral augmentation at T10. Chronic bilateral pars defects at L5.  IMPRESSION: Partial small bowel obstruction. Level of the obstruction is probably mid ileum. Specific etiology not demonstrated.  Free fluid which could relate to the small bowel obstruction or could be secondary to cirrhosis. The patient does appear to have cirrhosis of the liver.  Gallstones.  Advanced widespread atherosclerosis.  Left pleural effusion. Atelectasis and/or pneumonia in the dependent left lower lobe.   Electronically Signed   By: Nelson Chimes M.D.   On: 07/25/2014 21:28   Dg Abd 2 Views  08/02/2014   CLINICAL DATA:  74 year old male with a history of small bowel obstruction  EXAM: ABDOMEN - 2 VIEW  COMPARISON:  08/01/2014, 07/30/2014, 07/29/2014, CT 07/25/2014  FINDINGS: Multiple borderline dilated small bowel loops within the abdomen on the supine image. Upright image demonstrates air-fluid levels.  Paucity of distal colonic gas.  Surgical changes of prior median sternotomy, as well as surgical clips of the left upper quadrant.  Stones within the gallbladder again visualized in the right upper quadrant.  No displaced fracture.  Vascular calcifications.  IMPRESSION: Evidence of persisting small bowel obstruction, with multiple borderline dilated small bowel loops and paucity  of colonic gas.  Signed,  Dulcy Fanny. Earleen Newport,  DO  [facility]  [facility]   Electronically Signed   By: Corrie Mckusick D.O.   On: 08/02/2014 07:31   Dg Abd 2 Views  08/01/2014   CLINICAL DATA:  Follow-up of small bowel obstruction.  EXAM: ABDOMEN - 2 VIEW  COMPARISON:  Supine and upright abdominal films of Jul 30, 2014  FINDINGS: There has been interval removal of the esophagogastric tube. There remain loops of mildly distended gas and fluid-filled small bowel in the midline. The volume of gas present has decreased somewhat. There is some gas and fluid within the colon. Previously demonstrated colonic contrast has cleared. A small amount of air is present within the stomach. No definite extraluminal gas collections are demonstrated.  IMPRESSION: Persistent mid to distal relatively high-grade small bowel obstruction. Interval removal of the esophagogastric tube.  If the patient's clinical status has deteriorated, abdominal and pelvic CT scanning now would be useful.   Electronically Signed   By: Argelio  Martinique M.D.   On: 08/01/2014 10:38   Dg Abd 2 Views  07/30/2014   CLINICAL DATA:  Small-bowel obstruction.  EXAM: ABDOMEN - 2 VIEW  COMPARISON:  07/29/2014 .  FINDINGS: NG tube in stable position. Soft tissue structures are unremarkable. Persistent dilated loops of small bowel are again noted consistent with small bowel obstruction. Oral contrast again noted colon. No free air. Prior median sternotomy. Surgical clips left upper quadrant.  IMPRESSION: Persistent unchanged dilated loops of small bowel consistent with small bowel obstruction. Oral contrast is noted in the colon. No free air. NG tube in stable position.   Electronically Signed   By: Marcello Moores  Register   On: 07/30/2014 07:11   Dg Abd 2 Views  07/28/2014   CLINICAL DATA:  Ileus, nausea.  EXAM: ABDOMEN - 2 VIEW  COMPARISON:  07/27/2014  FINDINGS: NG tube is present in the stomach. There are dilated small bowel loops with air-fluid levels. Degree of small  bowel dilatation has increased since prior study. Colon is decompressed. No free air organomegaly. No suspicious calcification. Vascular calcifications noted in the aorta and iliac vessels. No acute bony abnormality.  IMPRESSION: Worsening small bowel dilatation compatible with worsening small bowel obstruction.   Electronically Signed   By: Rolm Baptise M.D.   On: 07/28/2014 08:57   Dg Abd Portable 1v  07/29/2014   CLINICAL DATA:  24 hour delay with Gastrografin. Small bowel protocol.  EXAM: PORTABLE ABDOMEN - 1 VIEW  COMPARISON:  07/28/2014  FINDINGS: Persistent gaseous distended loops of small bowel are demonstrated within the central abdomen measuring up to 3.5 cm, compatible with small bowel obstruction. Oral contrast material is demonstrated to the right lower quadrant, within the cecum and proximal ascending colon. NG tube projects over the left upper quadrant. Left upper quadrant surgical clips. Lower lumbar spine degenerative changes.  IMPRESSION: Oral contrast material demonstrated within the right lower quadrant, likely within the cecum and ascending colon.  Persistent gaseous distended loops of small bowel, compatible with obstruction.   Electronically Signed   By: Lovey Newcomer M.D.   On: 07/29/2014 13:40   Dg Abd Portable 1v-small Bowel Obstruction Protocol-initial, 8 Hr Delay  07/29/2014   CLINICAL DATA:  Small bowel obstruction  EXAM: PORTABLE ABDOMEN - 1 VIEW  COMPARISON:  07/28/2014  FINDINGS: The nasogastric tube extends into the proximal stomach with the side-port in the region of the EG junction.  There is persistent dilatation and  stacking of small bowel loops consistent with small bowel obstruction. No free air is evident. No interval change is evident.  IMPRESSION: Persistent small bowel obstruction.   Electronically Signed   By: Andreas Newport M.D.   On: 07/29/2014 02:27   Dg Abd Portable 1v  07/27/2014   CLINICAL DATA:  Small-bowel obstruction.  EXAM: PORTABLE ABDOMEN - 1 VIEW   COMPARISON:  07/25/2014  FINDINGS: Nasogastric tube is partially imaged, tip overlying the level of the stomach. There are persistent dilated small bowel loops in the central abdomen. Nondilated loops of large bowel persist. There is a small amount of residual contrast in the bladder.  IMPRESSION: 1. Persistent small bowel dilatation. 2. Residual contrast in the bladder.   Electronically Signed   By: Nolon Nations M.D.   On: 07/27/2014 09:10   Dg Abd Portable 1v  07/25/2014   CLINICAL DATA:  Abdominal pain.  Nausea and vomiting for 1 day.  EXAM: PORTABLE ABDOMEN - 1 VIEW  COMPARISON:  CT of earlier in the day  FINDINGS: Single supine portable view. Contrast within the urinary bladder. Gastric distension with possible nasogastric tube, incompletely imaged. Proximal small bowel loops measure up to 3.2 cm. Gas and stool within the colon. Advanced atherosclerosis.  IMPRESSION: Partial small bowel obstruction, without free intraperitoneal air or other acute complication.   Electronically Signed   By: Abigail Miyamoto M.D.   On: 07/25/2014 23:46    CBC  Recent Labs Lab 08/02/14 0423  WBC 7.3  HGB 9.0*  HCT 25.9*  PLT 202  MCV 88.7  MCH 30.8  MCHC 34.7  RDW 14.5    Chemistries   Recent Labs Lab 07/29/14 1450  07/31/14 0353 08/01/14 0409 08/01/14 0820 08/02/14 0423 08/03/14 0412 08/04/14 0405  NA  --   < > 132*  --  134* 130* 131* 131*  K  --   < > 3.3*  --  4.0 3.5 3.5 3.3*  CL  --   < > 100*  --  101 99* 99* 99*  CO2  --   < > 21*  --  22 20* 23 23  GLUCOSE  --   < > 162*  --  141* 144* 171* 143*  BUN  --   < > 18  --  18 13 10 11   CREATININE  --   < > 0.80 0.80 0.82 0.80 0.78 0.79  CALCIUM  --   < > 8.8*  --  9.0 8.6* 8.3* 8.3*  MG 1.9  --   --   --   --   --   --   --   < > = values in this interval not displayed. ------------------------------------------------------------------------------------------------------------------ estimated creatinine clearance is 91.3 mL/min (by  C-G formula based on Cr of 0.79). ------------------------------------------------------------------------------------------------------------------ No results for input(s): HGBA1C in the last 72 hours. ------------------------------------------------------------------------------------------------------------------ No results for input(s): CHOL, HDL, LDLCALC, TRIG, CHOLHDL, LDLDIRECT in the last 72 hours. ------------------------------------------------------------------------------------------------------------------ No results for input(s): TSH, T4TOTAL, T3FREE, THYROIDAB in the last 72 hours.  Invalid input(s): FREET3 ------------------------------------------------------------------------------------------------------------------ No results for input(s): VITAMINB12, FOLATE, FERRITIN, TIBC, IRON, RETICCTPCT in the last 72 hours.  Coagulation profile No results for input(s): INR, PROTIME in the last 168 hours.  No results for input(s): DDIMER in the last 72 hours.  Cardiac Enzymes No results for input(s): CKMB, TROPONINI, MYOGLOBIN in the last 168 hours.  Invalid input(s): CK ------------------------------------------------------------------------------------------------------------------ Invalid input(s): [place]  08/03/14 1557 08/03/14 1951 08/03/14 2339 08/04/14 0402  08/04/14 0755 08/04/14 Emily M.D. [facility] 08/04/2014, 12:02 PM  Pager: [phone]   Between 7am to 7pm - call Pager - [phone]  After 7pm go to [email or web address] - password [email or web address]  Call night coverage person covering after 7pm

## 2014-08-04 NOTE — Progress Notes (Signed)
PARENTERAL NUTRITION CONSULT NOTE - INITIAL  Pharmacy Consult for TPN Indication: ileus  Allergies  Allergen Reactions  . Hctz [Hydrochlorothiazide] Other (See Comments)    Hyponatremia     Patient Measurements: Height: 5' 9.5" (176.5 cm) Weight: 195 lb 1.6 oz (88.497 kg) IBW/kg (Calculated) : 71.85 Adjusted Body Weight:    Vital Signs: Temp: 98.2 F (36.8 C) (05/09 1002) Temp Source: Oral (05/09 1002) BP: 140/57 mmHg (05/09 1002) Pulse Rate: 67 (05/09 1002) Intake/Output from previous day: 05/08 0701 - 05/09 0700 In: 2460 [P.O.:60; I.V.:2400] Out: 1000 [Urine:1000] Intake/Output from this shift:    Labs:  Recent Labs  08/02/14 0423  WBC 7.3  HGB 9.0*  HCT 25.9*  PLT 202     Recent Labs  08/02/14 0423 08/03/14 0412 08/04/14 0405  NA 130* 131* 131*  K 3.5 3.5 3.3*  CL 99* 99* 99*  CO2 20* 23 23  GLUCOSE 144* 171* 143*  BUN 13 10 11   CREATININE 0.80 0.78 0.79  CALCIUM 8.6* 8.3* 8.3*   Estimated Creatinine Clearance: 91.3 mL/min (by C-G formula based on Cr of 0.79).    Recent Labs  08/03/14 2339 08/04/14 0402 08/04/14 0755  GLUCAP 127* 144* 118*    Medical History: Past Medical History  Diagnosis Date  . CAD (coronary artery disease)   . History of acute inferior wall MI   . Diabetes mellitus   . HTN (hypertension)   . Dyslipidemia   . 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 BPH (benign prostatic hyperplasia)   . 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 Hyponatremia     Medications:  Scheduled:  . amLODipine  5 mg Oral Daily  . antiseptic oral rinse  7 mL Mouth Rinse q12n4p  . aspirin EC  81 mg Oral Daily  . carvedilol  12.5 mg Oral BID WC  . chlorhexidine  15 mL Mouth Rinse BID  . enoxaparin (LOVENOX) injection  40 mg Subcutaneous Q24H  . insulin aspart  0-9 Units Subcutaneous Q4H  . losartan  100 mg Oral Daily  . morphine   Intravenous 6 times per day  . potassium chloride  10 mEq Intravenous Q1 Hr x 3  . simvastatin  20 mg Oral QHS     Insulin Requirements in the past 24 hours:  3 units SSI  Current Nutrition:  NPO  Assessment: 74 yo M POD #2 ex lap with LOA for single band adhesion.  Has ileus post-op.  Pharmacy consulted to start TPN 5/9 for nutrition support as pt has been over a week with NPO status.   GI: bloated, minimal flatus, no BM, no nausea, abd distended.  Endocrine: no hx DM, has SSI, 3 units/24 hours Lytes: k 3.3, 3 runs of K ordered; na 131 Renal: creat 0.79, has NS at 100 ml/hr. UOP 0.5 ml/kg/hr Nutritional Goals: per units RD  kCal,  grams of protein per day Access:  PICC ordered for TPN 5/9 TPN day: start 5/9  Plan:  -start TPN with Clinimix E 5/15 at 40 ml/hr plus IVFE at 10 ml/hr and assess tolerance -f/u with unit RD for goals -continue SSI -K runs per MD, TPN labs in am -decrease NS to 50 ml/hr when TPN started to keep same IVF rate  Eudelia Bunch, Pharm.D. [phone] 08/04/2014 10:29 AM

## 2014-08-04 NOTE — Progress Notes (Signed)
Peripherally Inserted Central Catheter/Midline Placement  The IV Nurse has discussed with the patient and/or persons authorized to consent for the patient, the purpose of this procedure and the potential benefits and risks involved with this procedure.  The benefits include less needle sticks, lab draws from the catheter and patient may be discharged home with the catheter.  Risks include, but not limited to, infection, bleeding, blood clot (thrombus formation), and puncture of an artery; nerve damage and irregular heat beat.  Alternatives to this procedure were also discussed.  PICC/Midline Placement Documentation        Henderson [place] 08/04/2014, 2:37 PM Consent obtained by Claretha Cooper, RN

## 2014-08-04 NOTE — Progress Notes (Signed)
Patient ID: [name], male   DOB: [birth year], 74 y.o.   MRN: [id number] 2 Days Post-Op  Subjective: Pt feels well today, but more bloated.  Minimal flatus, but no nausea  Objective: Vital signs in last 24 hours: Temp:  [98.5 F (36.9 C)-99.4 F (37.4 C)] 98.5 F (36.9 C) (05/09 0550) Pulse Rate:  [65-78] 65 (05/09 0550) Resp:  [13-19] 17 (05/09 0550) BP: (111-150)/(59-94) 143/59 mmHg (05/09 0550) SpO2:  [91 %-97 %] 97 % (05/09 0550) Last BM Date: 08/02/14  Intake/Output from previous day: 05/08 0701 - 05/09 0700 In: 2460 [P.O.:60; I.V.:2400] Out: 1000 [Urine:1000] Intake/Output this shift:    PE: Abd: soft, more distended today, but some BS, appropriately tender, incision c/d/i  Lab Results:   Recent Labs  08/02/14 0423  WBC 7.3  HGB 9.0*  HCT 25.9*  PLT 202   BMET  Recent Labs  08/03/14 0412 08/04/14 0405  NA 131* 131*  K 3.5 3.3*  CL 99* 99*  CO2 23 23  GLUCOSE 171* 143*  BUN 10 11  CREATININE 0.78 0.79  CALCIUM 8.3* 8.3*   PT/INR No results for input(s): LABPROT, INR in the last 72 hours. CMP     Component Value Date/Time   NA 131* 08/04/2014 0405   K 3.3* 08/04/2014 0405   CL 99* 08/04/2014 0405   CO2 23 08/04/2014 0405   GLUCOSE 143* 08/04/2014 0405   BUN 11 08/04/2014 0405   CREATININE 0.79 08/04/2014 0405   CALCIUM 8.3* 08/04/2014 0405   PROT 6.9 07/26/2014 0411   ALBUMIN 3.7 07/26/2014 0411   AST 26 07/26/2014 0411   ALT 16 07/26/2014 0411   ALKPHOS 106 07/26/2014 0411   BILITOT 1.1 07/26/2014 0411   GFRNONAA >60 08/04/2014 0405   GFRAA >60 08/04/2014 0405   Lipase     Component Value Date/Time   LIPASE 31 07/25/2014 1828       Studies/Results: No results found.  Anti-infectives: Anti-infectives    Start     Dose/Rate Route Frequency Ordered Stop   08/03/14 2200  azithromycin (ZITHROMAX) 500 mg in dextrose 5 % 250 mL IVPB     500 mg 250 mL/hr over 60 Minutes Intravenous Every 24 hours 08/03/14 0928 08/03/14 2339   08/03/14 2200  cefTRIAXone (ROCEPHIN) 1 g in dextrose 5 % 50 mL IVPB     1 g 100 mL/hr over 30 Minutes Intravenous Every 24 hours 08/03/14 0928 08/03/14 2308   08/02/14 1550  ceFAZolin (ANCEF) 2-3 GM-% IVPB SOLR    Comments:  Marinda Elk   : cabinet override      08/02/14 1550 08/02/14 1615   07/30/14 0800  ceFAZolin (ANCEF) IVPB 2 g/50 mL premix     2 g 100 mL/hr over 30 Minutes Intravenous To Surgery 07/30/14 0746 07/31/14 0800   07/25/14 2245  cefTRIAXone (ROCEPHIN) 1 g in dextrose 5 % 50 mL IVPB  Status:  Discontinued     1 g 100 mL/hr over 30 Minutes Intravenous Every 24 hours 07/25/14 2234 08/03/14 0928   07/25/14 2245  azithromycin (ZITHROMAX) 500 mg in dextrose 5 % 250 mL IVPB  Status:  Discontinued     500 mg 250 mL/hr over 60 Minutes Intravenous Every 24 hours 07/25/14 2234 08/03/14 0928       Assessment/Plan   POD2, s/p ex lap with LOA for single band adhesion, Dr. Georgette Dover 08-02-14 -cont NPO x ice today.  Has mild ileus, await bowel function -mobilize and pulm toilet -agree with  TNA given over a week of NPO status DVT prophylaxis -lovenox/SCDs  LOS: 10 days    Mccabe Gloria E 08/04/2014, 8:55 AM Pager: [phone]

## 2014-08-05 LAB — GLUCOSE, CAPILLARY
GLUCOSE-CAPILLARY: 224 mg/dL — AB (ref 70–99)
Glucose-Capillary: 232 mg/dL — ABNORMAL HIGH (ref 70–99)
Glucose-Capillary: 249 mg/dL — ABNORMAL HIGH (ref 70–99)
Glucose-Capillary: 212 mg/dL — ABNORMAL HIGH (ref 70–99)
Glucose-Capillary: 241 mg/dL — ABNORMAL HIGH (ref 70–99)

## 2014-08-05 LAB — COMPREHENSIVE METABOLIC PANEL
ALBUMIN: 2.9 g/dL — AB (ref 3.5–5.0)
ALK PHOS: 72 U/L (ref 38–126)
ALT: 23 U/L (ref 17–63)
AST: 25 U/L (ref 15–41)
Anion gap: 12 (ref 5–15)
BILIRUBIN TOTAL: 1.2 mg/dL (ref 0.3–1.2)
BUN: 13 mg/dL (ref 6–20)
CO2: 22 mmol/L (ref 22–32)
Calcium: 8.6 mg/dL — ABNORMAL LOW (ref 8.9–10.3)
Chloride: 100 mmol/L — ABNORMAL LOW (ref 101–111)
Creatinine, Ser: 0.77 mg/dL (ref 0.61–1.24)
GFR calc Af Amer: >60 mL/min (ref >60)
Glucose, Bld: 226 mg/dL — ABNORMAL HIGH (ref 70–99)
POTASSIUM: 3.5 mmol/L (ref 3.5–5.1)
SODIUM: 134 mmol/L — AB (ref 135–145)
Total Protein: 5.8 g/dL — ABNORMAL LOW (ref 6.5–8.1)

## 2014-08-05 LAB — CBC
HCT: 23.2 % — ABNORMAL LOW (ref 39.0–52.0)
HEMOGLOBIN: 8.2 g/dL — AB (ref 13.0–17.0)
MCH: 31.2 pg (ref 26.0–34.0)
MCHC: 35.3 g/dL (ref 30.0–36.0)
MCV: 88.2 fL (ref 78.0–100.0)
Platelets: 186 10*3/uL (ref 150–400)
RBC: 2.63 MIL/uL — AB (ref 4.22–5.81)
RDW: 14.5 % (ref 11.5–15.5)
WBC: 9.4 10*3/uL (ref 4.0–10.5)

## 2014-08-05 LAB — MAGNESIUM: Magnesium: 1.8 mg/dL (ref 1.7–2.4)

## 2014-08-05 LAB — BRAIN NATRIURETIC PEPTIDE: B NATRIURETIC PEPTIDE 5: 1132.9 pg/mL — AB (ref 0.0–100.0)

## 2014-08-05 LAB — PHOSPHORUS: Phosphorus: 2.5 mg/dL (ref 2.5–4.6)

## 2014-08-05 LAB — TRIGLYCERIDES: TRIGLYCERIDES: 53 mg/dL (ref <150)

## 2014-08-05 LAB — PREALBUMIN: Prealbumin: 6.6 mg/dL — ABNORMAL LOW (ref 18–38)

## 2014-08-05 MED ORDER — HYDROMORPHONE HCL 1 MG/ML IJ SOLN: 0.5000 mg | INTRAMUSCULAR | Status: DC | PRN

## 2014-08-05 MED ORDER — DEXTROSE 5 % IV SOLN
10.0000 mmol | Freq: Once | INTRAVENOUS | Status: AC
  Administered 2014-08-05: 10 mmol via INTRAVENOUS
  Filled 2014-08-05: qty 3.33

## 2014-08-05 MED ORDER — POTASSIUM CHLORIDE 10 MEQ/100ML IV SOLN
10.0000 meq | INTRAVENOUS | Status: DC
  Administered 2014-08-05 (×2): 10 meq via INTRAVENOUS
  Filled 2014-08-05 (×2): qty 100

## 2014-08-05 MED ORDER — FAT EMULSION 20 % IV EMUL
250.0000 mL | INTRAVENOUS | Status: AC
  Administered 2014-08-05: 250 mL via INTRAVENOUS
  Filled 2014-08-05: qty 250

## 2014-08-05 MED ORDER — TRACE MINERALS CR-CU-F-FE-I-MN-MO-SE-ZN IV SOLN
INTRAVENOUS | Status: AC
  Administered 2014-08-05: 17:00:00 via INTRAVENOUS
  Filled 2014-08-05: qty 1440

## 2014-08-05 MED ORDER — FUROSEMIDE 10 MG/ML IJ SOLN
40.0000 mg | Freq: Once | INTRAMUSCULAR | Status: AC
  Administered 2014-08-05: 40 mg via INTRAVENOUS

## 2014-08-05 MED ORDER — FUROSEMIDE 10 MG/ML IJ SOLN
INTRAMUSCULAR | Status: AC
  Filled 2014-08-05: qty 4

## 2014-08-05 MED ORDER — IPRATROPIUM-ALBUTEROL 0.5-2.5 (3) MG/3ML IN SOLN
3.0000 mL | Freq: Four times a day (QID) | RESPIRATORY_TRACT | Status: DC
  Administered 2014-08-05 – 2014-08-06 (×3): 3 mL via RESPIRATORY_TRACT
  Filled 2014-08-05 (×5): qty 3

## 2014-08-05 MED ORDER — POTASSIUM CHLORIDE 10 MEQ/50ML IV SOLN
10.0000 meq | INTRAVENOUS | Status: AC
  Administered 2014-08-05 (×3): 10 meq via INTRAVENOUS
  Filled 2014-08-05 (×3): qty 50

## 2014-08-05 MED ORDER — BISACODYL 10 MG RE SUPP
10.0000 mg | Freq: Two times a day (BID) | RECTAL | Status: AC
Start: 2014-08-05 — End: 2014-08-06
  Administered 2014-08-05 – 2014-08-06 (×3): 10 mg via RECTAL
  Filled 2014-08-05 (×3): qty 1

## 2014-08-05 MED ORDER — MAGNESIUM SULFATE 2 GM/50ML IV SOLN
2.0000 g | Freq: Once | INTRAVENOUS | Status: AC
  Administered 2014-08-05: 2 g via INTRAVENOUS
  Filled 2014-08-05: qty 50

## 2014-08-05 MED ORDER — POTASSIUM CHLORIDE 10 MEQ/100ML IV SOLN: 10.0000 meq | INTRAVENOUS | Status: DC

## 2014-08-05 MED ORDER — INSULIN ASPART 100 UNIT/ML ~~LOC~~ SOLN
0.0000 [IU] | SUBCUTANEOUS | Status: DC
  Administered 2014-08-05 (×3): 5 [IU] via SUBCUTANEOUS
  Administered 2014-08-06 (×3): 3 [IU] via SUBCUTANEOUS
  Administered 2014-08-06: 5 [IU] via SUBCUTANEOUS
  Administered 2014-08-06: 2 [IU] via SUBCUTANEOUS
  Administered 2014-08-06: 3 [IU] via SUBCUTANEOUS
  Administered 2014-08-07: 2 [IU] via SUBCUTANEOUS
  Administered 2014-08-07 (×2): 3 [IU] via SUBCUTANEOUS
  Administered 2014-08-07: 2 [IU] via SUBCUTANEOUS
  Administered 2014-08-07 (×2): 3 [IU] via SUBCUTANEOUS

## 2014-08-05 NOTE — Progress Notes (Signed)
[place] NOTE Pharmacy Consult for TPN Indication: ileus  Allergies  Allergen Reactions  . Hctz [Hydrochlorothiazide] Other (See Comments)    Hyponatremia     Patient Measurements: Height: 5' 9.5" (176.5 cm) Weight: 196 lb 4.8 oz (89.041 kg) IBW/kg (Calculated) : 71.85    Vital Signs: Temp: 98.7 F (37.1 C) (05/10 0452) Temp Source: Oral (05/10 0452) BP: 143/67 mmHg (05/10 0452) Pulse Rate: 80 (05/10 0452) Intake/Output from previous day: 05/09 0701 - 05/10 0700 In: 1610.7 [I.V.:800; TPN:810.7] Out: 1050 [Urine:1050] Intake/Output from this shift:    Labs:  Recent Labs  08/05/14 0550  WBC 9.4  HGB 8.2*  HCT 23.2*  PLT 186     Recent Labs  08/03/14 0412 08/04/14 0405 08/05/14 0500 08/05/14 0550  NA 131* 131*  --  134*  K 3.5 3.3*  --  3.5  CL 99* 99*  --  100*  CO2 23 23  --  22  GLUCOSE 171* 143*  --  226*  BUN 10 11  --  13  CREATININE 0.78 0.79  --  0.77  CALCIUM 8.3* 8.3*  --  8.6*  MG  --   --   --  1.8  PHOS  --   --   --  2.5  PROT  --   --   --  5.8*  ALBUMIN  --   --   --  2.9*  AST  --   --   --  25  ALT  --   --   --  23  ALKPHOS  --   --   --  72  BILITOT  --   --   --  1.2  PREALBUMIN  --   --   --  6.6*  TRIG  --   --  53  --    Estimated Creatinine Clearance: 91.5 mL/min (by C-G formula based on Cr of 0.77).    Recent Labs  08/04/14 2346 08/05/14 0358 08/05/14 0736  GLUCAP 187* 232* 249*    Medical History: Past Medical History  Diagnosis Date  . CAD (coronary artery disease)   . History of acute inferior wall MI   . Diabetes mellitus   . HTN (hypertension)   . Dyslipidemia   . 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 BPH (benign prostatic hyperplasia)   . 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 Hyponatremia      Insulin Requirements in the past 24 hours:  9 units SSI  Current Nutrition:  NPO TPN with Clinimix E 5/15 at 40 ml/hr plus IVFE at 10 ml/hr  Assessment: 74 yo M POD #3 ex lap with  LOA for single band adhesion.  Has ileus post-op.  Pharmacy consulted to start TPN 5/9 for nutrition support as pt has been over a week with NPO status.   GI:  flatus, no BM, abd distended. Still bloated.  Endocrine:  hx DM, has SSI, 3 units/24 hours; goal 120 - 180 per surgical guidelines Lytes: k 3.5, 5 runs of K ordered; na 134., phos 2.5, mag 1.8 (goal > = 2 with ileus) Renal: creat WNL, has NS at 50 ml/hr, TRH ordered lasix 40 IV x 1 Hepatic: LFTs WNL, trig 53, t bili 1.2 Nutritional Goals: per units RD note 5/9  2000-2200 kCal,  105-115 grams of protein per day Access:  PICC placed for TPN 5/9 TPN day: start 5/9  Plan:  -increase Clinimix E 5/15 to 60 ml/hr plus  IVFE at 10 ml/hr.  This will provide 72 gm protein and 1502 kcals -Goal rate of Clinimix E 5/15 at 83 ml/hr will provide 100 gm protein and 1894 kcal which is ~ 95% of goal. -continue SSI. Add insulin to TPN 30 units/2 liter bag - 5 K runs per surgery, dc'd after 2 runs by Dr. Tana Coast due to volume overload, I called her and got order to complete 5 runs as ordered by Dalbert Batman -mag 2 gm and 10 mM Naphos for repletion -IVF dc'd by MD  Eudelia Bunch, Pharm.D. [phone] 08/05/2014 10:03 AM

## 2014-08-05 NOTE — Progress Notes (Signed)
[facility]                                                                              Patient Demographics  [name], is a 74 y.o. male, DOB - [birth year], QAS:[id number]  Admit date - 07/25/2014   Admitting Physician Jani Gravel, MD  Outpatient Primary MD for the patient is  Melinda Crutch, MD  LOS - 11   Chief Complaint  Patient presents with  . Abdominal Pain       Brief HPI   Patient is a 74 year old male with CAD, diabetes type 2, hypertension, hyperlipidemia presented with abdominal pain and described as periumbilical, sharp with nausea and vomiting. He denied any fevers or chills, diarrhea or melena or hematochezia. Patient underwent CT of the abdomen and pelvis which showed partial small bowel traction. Patient was admitted for further workup.  Assessment & Plan    Principal Problem:   SBO (small bowel obstruction)- postop day # 3 - Status post exploratory laparoscopy with lysis of adhesions - Continue management per surgery recommendations - Started on TNA for nutrition  Active Problems: Dyspnea, wheezing; likely due to fluid overload -  I's and O's showed 15 L positive, discontinued IV fluids, now as he has been started on TNA, BNP 1132, gave Lasix 40 mg IV 1. Reassess in a.m., also placed on DuoNeb's.    CAD (coronary artery disease): - Currently stable, no chest pain or shortness of breath    Diabetes mellitus - Hemoglobin A1c 6.4, good outpatient glycemic control, likely hyperglycemia due to starting TNA yesterday. Change to sliding scale insulin to moderate, may need to add basal insulin if BS continues to rise.    HTN (hypertension) - Well-controlled, continue Norvasc, Coreg  Questionable left pleural effusion on CT abdomen, CAP on CXR.  - No coughing or respiratory symptoms, afebrile. Chest x-ray showed patchy infiltrates in the upper lobe and left lower lobe - Does not need any further antibiotics, on day #8, completed antibiotics for  Zithromax/Rocephin -  repeat chest x-ray in 4-6 weeks to ensure complete resolution of pneumonia.  Normocytic anemia - Currently stable  Code Status: Full code   Family Communication: Discussed in detail with the patient, all imaging results, lab results explained to the patient and and wife at bedside  Disposition Plan: not medically ready  Time Spent in minutes  25 minutes  Procedures  abd Xray CT abd  Consults   Surgery  DVT Prophylaxis  SCD's  Medications  Scheduled Meds: . amLODipine  5 mg Oral Daily  . aspirin EC  81 mg Oral Daily  . bisacodyl  10 mg Rectal BID  . carvedilol  12.5 mg Oral BID WC  . enoxaparin (LOVENOX) injection  40 mg Subcutaneous Q24H  . insulin aspart  0-15 Units Subcutaneous 6 times per day  . ipratropium-albuterol  3 mL Nebulization QID  . losartan  100 mg Oral Daily  . potassium chloride  10 mEq Intravenous Q1 Hr x 3  . simvastatin  20 mg Oral QHS  . sodium phosphate  Dextrose 5% IVPB  10 mmol Intravenous Once   Continuous Infusions: . Marland KitchenTPN (  CLINIMIX-E) Adult 40 mL/hr at 08/04/14 1716   And  . fat emulsion 250 mL (08/04/14 1716)  . Marland KitchenTPN (CLINIMIX-E) Adult     And  . fat emulsion     PRN Meds:.acetaminophen **OR** acetaminophen, ALPRAZolam, HYDROmorphone (DILAUDID) injection, nitroGLYCERIN, ondansetron (ZOFRAN) IV, sodium chloride   Antibiotics   Anti-infectives    Start     Dose/Rate Route Frequency Ordered Stop   08/03/14 2200  azithromycin (ZITHROMAX) 500 mg in dextrose 5 % 250 mL IVPB     500 mg 250 mL/hr over 60 Minutes Intravenous Every 24 hours 08/03/14 0928 08/03/14 2339   08/03/14 2200  cefTRIAXone (ROCEPHIN) 1 g in dextrose 5 % 50 mL IVPB     1 g 100 mL/hr over 30 Minutes Intravenous Every 24 hours 08/03/14 0928 08/03/14 2308   08/02/14 1550  ceFAZolin (ANCEF) 2-3 GM-% IVPB SOLR    Comments:  Tommi Rumps, Scot   : cabinet override      08/02/14 1550 08/02/14 1615   07/30/14 0800  ceFAZolin (ANCEF) IVPB 2 g/50 mL premix      2 g 100 mL/hr over 30 Minutes Intravenous To Surgery 07/30/14 0746 07/31/14 0800   07/25/14 2245  cefTRIAXone (ROCEPHIN) 1 g in dextrose 5 % 50 mL IVPB  Status:  Discontinued     1 g 100 mL/hr over 30 Minutes Intravenous Every 24 hours 07/25/14 2234 08/03/14 0928   07/25/14 2245  azithromycin (ZITHROMAX) 500 mg in dextrose 5 % 250 mL IVPB  Status:  Discontinued     500 mg 250 mL/hr over 60 Minutes Intravenous Every 24 hours 07/25/14 2234 08/03/14 6568        Subjective:   [name] was seen and examined today. Postop, feels bloated today, no BM, slight nausea. Denies any chest pain, shortness of breath, new weakness or any numbness or tingling. Afebrile   Objective:   Blood pressure 143/67, pulse 80, temperature 98.7 F (37.1 C), temperature source Oral, resp. rate 23, height 5' 9.5" (1.765 m), weight 89.041 kg (196 lb 4.8 oz), SpO2 92 %.  Wt Readings from Last 3 Encounters:  08/05/14 89.041 kg (196 lb 4.8 oz)  01/24/14 81.375 kg (179 lb 6.4 oz)  05/27/13 81.194 kg (179 lb)     Intake/Output Summary (Last 24 hours) at 08/05/14 1145 Last data filed at 08/05/14 0800  Gross per 24 hour  Intake 1730.67 ml  Output   1350 ml  Net 380.67 ml    Exam  General: Alert and oriented x 3, NAD  HEENT:  PERRLA, EOMI  Neck: Supple, no JVD  CVS: S1 S2 clear, no mrg  Respiratory: Bilateral wheezing with bibasilar crackles  Abdomen: Soft, dressing intact, hypoactive bowel sounds  Ext: no c/c/e bilateral lower extremities  Neuro: no new focal neurological deficits  Skin: No rashes  Psych: Alert and oriented 3   Data Review   Micro Results Recent Results (from the past 240 hour(s))  Surgical pcr screen     Status: None   Collection Time: 07/30/14  8:18 AM  Result Value Ref Range Status   MRSA, PCR NEGATIVE NEGATIVE Final   Staphylococcus aureus NEGATIVE NEGATIVE Final    Comment:        The Xpert SA Assay (FDA approved for NASAL specimens in patients over 71  years of age), is one component of a comprehensive surveillance program.  Test performance has been validated by [facility] for patients greater than or equal to 74 year old. It is  not intended to diagnose infection nor to guide or monitor treatment.   MRSA PCR Screening     Status: None   Collection Time: 08/02/14  2:32 PM  Result Value Ref Range Status   MRSA by PCR NEGATIVE NEGATIVE Final    Comment:        The GeneXpert MRSA Assay (FDA approved for NASAL specimens only), is one component of a comprehensive MRSA colonization surveillance program. It is not intended to diagnose MRSA infection nor to guide or monitor treatment for MRSA infections.     Radiology Reports Dg Chest 2 View  07/26/2014   CLINICAL DATA:  Pleural effusion.  EXAM: CHEST  2 VIEW  COMPARISON:  02/17/2012  FINDINGS: Patient's nasogastric tube in place, tip coiled back upon itself into the lower esophagus. Status post median sternotomy and CABG. Heart is enlarged. There is mild interstitial edema. There patchy infiltrates within the left upper lobe and left lower lobe. Left pleural effusion. Persistent wedge compression of T10, unchanged. Mild dilatation of small bowel loops, unchanged.  IMPRESSION: 1. Cardiomegaly and mild edema. 2. Left upper lobe and left lower lobe infiltrates associated with left pleural effusion. 3. Nasogastric tube coiled back upon itself into lower esophagus. 4. The salient findings were discussed with [place] on 07/26/2014 at 8:04 pm.   Electronically Signed   By: Nolon Nations M.D.   On: 07/26/2014 20:04   Ct Abdomen Pelvis W Contrast  07/25/2014   CLINICAL DATA:  Mid to lower abdominal pain since 12 hours ago. Vomiting.  EXAM: CT ABDOMEN AND PELVIS WITH CONTRAST  TECHNIQUE: Multidetector CT imaging of the abdomen and pelvis was performed using the standard protocol following bolus administration of intravenous contrast.  CONTRAST:  155mL OMNIPAQUE IOHEXOL 300 MG/ML  SOLN  COMPARISON:   None.  FINDINGS: There is a left effusion layering dependently with patchy density in the dependent left lung that could be atelectasis or pneumonia.  The liver shows a pattern raising the possibility of early cirrhosis. There are 2 benign calcifications in the left lobe. There are calcified stones in the gallbladder neck. No stone is seen along the course of the common duct. The spleen is normal. There are bilateral low-density adrenal adenomas. The kidneys are normal. The aorta and its branch vessels show extensive atherosclerotic disease. No aneurysm. Bladder, prostate gland and seminal vesicles are unremarkable.  There are dilated fluid and air-filled loops of small intestine consistent with partial small bowel obstruction. There is a caliber change at the mid ileum. Specific obstructing lesion is not identified. There is free intraperitoneal fluid but no free intraperitoneal air are identified. Old vertebral augmentation at T10. Chronic bilateral pars defects at L5.  IMPRESSION: Partial small bowel obstruction. Level of the obstruction is probably mid ileum. Specific etiology not demonstrated.  Free fluid which could relate to the small bowel obstruction or could be secondary to cirrhosis. The patient does appear to have cirrhosis of the liver.  Gallstones.  Advanced widespread atherosclerosis.  Left pleural effusion. Atelectasis and/or pneumonia in the dependent left lower lobe.   Electronically Signed   By: Nelson Chimes M.D.   On: 07/25/2014 21:28   Dg Abd 2 Views  08/02/2014   CLINICAL DATA:  74 year old male with a history of small bowel obstruction  EXAM: ABDOMEN - 2 VIEW  COMPARISON:  08/01/2014, 07/30/2014, 07/29/2014, CT 07/25/2014  FINDINGS: Multiple borderline dilated small bowel loops within the abdomen on the supine image. Upright image demonstrates air-fluid levels.  Paucity of distal  colonic gas.  Surgical changes of prior median sternotomy, as well as surgical clips of the left upper quadrant.   Stones within the gallbladder again visualized in the right upper quadrant.  No displaced fracture.  Vascular calcifications.  IMPRESSION: Evidence of persisting small bowel obstruction, with multiple borderline dilated small bowel loops and paucity of colonic gas.  Signed,  Dulcy Fanny. Earleen Newport, DO  [facility]  [facility]   Electronically Signed   By: Corrie Mckusick D.O.   On: 08/02/2014 07:31   Dg Abd 2 Views  08/01/2014   CLINICAL DATA:  Follow-up of small bowel obstruction.  EXAM: ABDOMEN - 2 VIEW  COMPARISON:  Supine and upright abdominal films of Jul 30, 2014  FINDINGS: There has been interval removal of the esophagogastric tube. There remain loops of mildly distended gas and fluid-filled small bowel in the midline. The volume of gas present has decreased somewhat. There is some gas and fluid within the colon. Previously demonstrated colonic contrast has cleared. A small amount of air is present within the stomach. No definite extraluminal gas collections are demonstrated.  IMPRESSION: Persistent mid to distal relatively high-grade small bowel obstruction. Interval removal of the esophagogastric tube.  If the patient's clinical status has deteriorated, abdominal and pelvic CT scanning now would be useful.   Electronically Signed   By: Ky  Martinique M.D.   On: 08/01/2014 10:38   Dg Abd 2 Views  07/30/2014   CLINICAL DATA:  Small-bowel obstruction.  EXAM: ABDOMEN - 2 VIEW  COMPARISON:  07/29/2014 .  FINDINGS: NG tube in stable position. Soft tissue structures are unremarkable. Persistent dilated loops of small bowel are again noted consistent with small bowel obstruction. Oral contrast again noted colon. No free air. Prior median sternotomy. Surgical clips left upper quadrant.  IMPRESSION: Persistent unchanged dilated loops of small bowel consistent with small bowel obstruction. Oral contrast is noted in the colon. No free air. NG tube in stable position.    Electronically Signed   By: Marcello Moores  Register   On: 07/30/2014 07:11   Dg Abd 2 Views  07/28/2014   CLINICAL DATA:  Ileus, nausea.  EXAM: ABDOMEN - 2 VIEW  COMPARISON:  07/27/2014  FINDINGS: NG tube is present in the stomach. There are dilated small bowel loops with air-fluid levels. Degree of small bowel dilatation has increased since prior study. Colon is decompressed. No free air organomegaly. No suspicious calcification. Vascular calcifications noted in the aorta and iliac vessels. No acute bony abnormality.  IMPRESSION: Worsening small bowel dilatation compatible with worsening small bowel obstruction.   Electronically Signed   By: Rolm Baptise M.D.   On: 07/28/2014 08:57   Dg Abd Portable 1v  07/29/2014   CLINICAL DATA:  24 hour delay with Gastrografin. Small bowel protocol.  EXAM: PORTABLE ABDOMEN - 1 VIEW  COMPARISON:  07/28/2014  FINDINGS: Persistent gaseous distended loops of small bowel are demonstrated within the central abdomen measuring up to 3.5 cm, compatible with small bowel obstruction. Oral contrast material is demonstrated to the right lower quadrant, within the cecum and proximal ascending colon. NG tube projects over the left upper quadrant. Left upper quadrant surgical clips. Lower lumbar spine degenerative changes.  IMPRESSION: Oral contrast material demonstrated within the right lower quadrant, likely within the cecum and ascending colon.  Persistent gaseous distended loops of small bowel, compatible with obstruction.   Electronically Signed   By: Lovey Newcomer M.D.   On: 07/29/2014 13:40   Dg Abd  Portable 1v-small Bowel Obstruction Protocol-initial, 8 Hr Delay  07/29/2014   CLINICAL DATA:  Small bowel obstruction  EXAM: PORTABLE ABDOMEN - 1 VIEW  COMPARISON:  07/28/2014  FINDINGS: The nasogastric tube extends into the proximal stomach with the side-port in the region of the EG junction.  There is persistent dilatation and stacking of small bowel loops consistent with small bowel  obstruction. No free air is evident. No interval change is evident.  IMPRESSION: Persistent small bowel obstruction.   Electronically Signed   By: Andreas Newport M.D.   On: 07/29/2014 02:27   Dg Abd Portable 1v  07/27/2014   CLINICAL DATA:  Small-bowel obstruction.  EXAM: PORTABLE ABDOMEN - 1 VIEW  COMPARISON:  07/25/2014  FINDINGS: Nasogastric tube is partially imaged, tip overlying the level of the stomach. There are persistent dilated small bowel loops in the central abdomen. Nondilated loops of large bowel persist. There is a small amount of residual contrast in the bladder.  IMPRESSION: 1. Persistent small bowel dilatation. 2. Residual contrast in the bladder.   Electronically Signed   By: Nolon Nations M.D.   On: 07/27/2014 09:10   Dg Abd Portable 1v  07/25/2014   CLINICAL DATA:  Abdominal pain.  Nausea and vomiting for 1 day.  EXAM: PORTABLE ABDOMEN - 1 VIEW  COMPARISON:  CT of earlier in the day  FINDINGS: Single supine portable view. Contrast within the urinary bladder. Gastric distension with possible nasogastric tube, incompletely imaged. Proximal small bowel loops measure up to 3.2 cm. Gas and stool within the colon. Advanced atherosclerosis.  IMPRESSION: Partial small bowel obstruction, without free intraperitoneal air or other acute complication.   Electronically Signed   By: Abigail Miyamoto M.D.   On: 07/25/2014 23:46    CBC  Recent Labs Lab 08/02/14 0423 08/05/14 0550  WBC 7.3 9.4  HGB 9.0* 8.2*  HCT 25.9* 23.2*  PLT 202 186  MCV 88.7 88.2  MCH 30.8 31.2  MCHC 34.7 35.3  RDW 14.5 14.5    Chemistries   Recent Labs Lab 07/29/14 1450  08/01/14 0820 08/02/14 0423 08/03/14 0412 08/04/14 0405 08/05/14 0550  NA  --   < > 134* 130* 131* 131* 134*  K  --   < > 4.0 3.5 3.5 3.3* 3.5  CL  --   < > 101 99* 99* 99* 100*  CO2  --   < > 22 20* 23 23 22   GLUCOSE  --   < > 141* 144* 171* 143* 226*  BUN  --   < > 18 13 10 11 13   CREATININE  --   < > 0.82 0.80 0.78 0.79 0.77    CALCIUM  --   < > 9.0 8.6* 8.3* 8.3* 8.6*  MG 1.9  --   --   --   --   --  1.8  AST  --   --   --   --   --   --  25  ALT  --   --   --   --   --   --  23  ALKPHOS  --   --   --   --   --   --  72  BILITOT  --   --   --   --   --   --  1.2  < > = values in this interval not displayed. ------------------------------------------------------------------------------------------------------------------ estimated creatinine clearance is 91.5 mL/min (by C-G formula based on Cr of 0.77). ------------------------------------------------------------------------------------------------------------------  No results for input(s): HGBA1C in the last 72 hours. ------------------------------------------------------------------------------------------------------------------  Recent Labs  08/05/14 0500  TRIG 53   ------------------------------------------------------------------------------------------------------------------ No results for input(s): TSH, T4TOTAL, T3FREE, THYROIDAB in the last 72 hours.  Invalid input(s): FREET3 ------------------------------------------------------------------------------------------------------------------ No results for input(s): VITAMINB12, FOLATE, FERRITIN, TIBC, IRON, RETICCTPCT in the last 72 hours.  Coagulation profile No results for input(s): INR, PROTIME in the last 168 hours.  No results for input(s): DDIMER in the last 72 hours.  Cardiac Enzymes No results for input(s): CKMB, TROPONINI, MYOGLOBIN in the last 168 hours.  Invalid input(s): CK ------------------------------------------------------------------------------------------------------------------ Invalid input(s): [place]  08/04/14 1147 08/04/14 1547 08/04/14 1934 08/04/14 2346 08/05/14 0358 08/05/14 0736  GLUCAP 138* 144* 187* 187* 32* 249*     Alara Daniel M.D. [facility] 08/05/2014, 11:45 AM  Pager: [phone]   Between 7am to 7pm - call Pager -  [phone]  After 7pm go to [email or web address] - password [email or web address]  Call night coverage person covering after 7pm

## 2014-08-05 NOTE — Progress Notes (Addendum)
3 Days Post-Op  Subjective: Feels a little better today. Passing some flatus but no stool. Still feels a little bloated and tight. Still has Foley catheter. No history of bladder or prostate problems. Says he ambulated in the hall 3 times yesterday.  [facility] hospitalist following for CAD, diabetes, hypertension, questionable left pleural effusion, normocytic anemia  Potassium 3.5. Creatinine 0.77. Glucose 226. Albumin 2.9. Hemoglobin 8.2. WBC 9400. On TNA.    Objective: Vital signs in last 24 hours: Temp:  [98 F (36.7 C)-99 F (37.2 C)] 98.7 F (37.1 C) (05/10 0452) Pulse Rate:  [67-80] 80 (05/10 0452) Resp:  [16-23] 23 (05/10 0452) BP: (124-150)/(54-77) 143/67 mmHg (05/10 0452) SpO2:  [92 %-98 %] 92 % (05/10 0452) Weight:  [89.041 kg (196 lb 4.8 oz)] 89.041 kg (196 lb 4.8 oz) (05/10 0452) Last BM Date: 08/02/14  Intake/Output from previous day: 05/09 0701 - 05/10 0700 In: 1610.7 [I.V.:800; TPN:810.7] Out: 1050 [Urine:1050] Intake/Output this shift:    General appearance: Alert and cooperative. Doesn't appear in distress. Mental status normal. Wife present. Resp: clear to auscultation bilaterally GI: Abdomen soft. Hypoactive bowel sounds. A little distended and tympanitic. Incision clean. Examined.  Lab Results:  Results for orders placed or performed during the hospital encounter of 07/25/14 (from the past 24 hour(s))  Glucose, capillary     Status: Abnormal   Collection Time: 08/04/14  7:55 AM  Result Value Ref Range   Glucose-Capillary 118 (H) 70 - 99 mg/dL  Glucose, capillary     Status: Abnormal   Collection Time: 08/04/14 11:47 AM  Result Value Ref Range   Glucose-Capillary 138 (H) 70 - 99 mg/dL  Glucose, capillary     Status: Abnormal   Collection Time: 08/04/14  3:47 PM  Result Value Ref Range   Glucose-Capillary 144 (H) 70 - 99 mg/dL  Glucose, capillary     Status: Abnormal   Collection Time: 08/04/14  7:34 PM  Result Value Ref Range   Glucose-Capillary  187 (H) 70 - 99 mg/dL   Comment 1 Notify RN   Glucose, capillary     Status: Abnormal   Collection Time: 08/04/14 11:46 PM  Result Value Ref Range   Glucose-Capillary 187 (H) 70 - 99 mg/dL  Glucose, capillary     Status: Abnormal   Collection Time: 08/05/14  3:58 AM  Result Value Ref Range   Glucose-Capillary 232 (H) 70 - 99 mg/dL  Triglycerides     Status: None   Collection Time: 08/05/14  5:00 AM  Result Value Ref Range   Triglycerides 53 <150 mg/dL  CBC     Status: Abnormal   Collection Time: 08/05/14  5:50 AM  Result Value Ref Range   WBC 9.4 4.0 - 10.5 K/uL   RBC 2.63 (L) 4.22 - 5.81 MIL/uL   Hemoglobin 8.2 (L) 13.0 - 17.0 g/dL   HCT 23.2 (L) 39.0 - 52.0 %   MCV 88.2 78.0 - 100.0 fL   MCH 31.2 26.0 - 34.0 pg   MCHC 35.3 30.0 - 36.0 g/dL   RDW 14.5 11.5 - 15.5 %   Platelets 186 150 - 400 K/uL  Comprehensive metabolic panel     Status: Abnormal   Collection Time: 08/05/14  5:50 AM  Result Value Ref Range   Sodium 134 (L) 135 - 145 mmol/L   Potassium 3.5 3.5 - 5.1 mmol/L   Chloride 100 (L) 101 - 111 mmol/L   CO2 22 22 - 32 mmol/L   Glucose, Bld 226 (H)  70 - 99 mg/dL   BUN 13 6 - 20 mg/dL   Creatinine, Ser 0.77 0.61 - 1.24 mg/dL   Calcium 8.6 (L) 8.9 - 10.3 mg/dL   Total Protein 5.8 (L) 6.5 - 8.1 g/dL   Albumin 2.9 (L) 3.5 - 5.0 g/dL   AST 25 15 - 41 U/L   ALT 23 17 - 63 U/L   Alkaline Phosphatase 72 38 - 126 U/L   Total Bilirubin 1.2 0.3 - 1.2 mg/dL   GFR calc non Af Amer >60 >60 mL/min   GFR calc Af Amer >60 >60 mL/min   Anion gap 12 5 - 15  Magnesium     Status: None   Collection Time: 08/05/14  5:50 AM  Result Value Ref Range   Magnesium 1.8 1.7 - 2.4 mg/dL  Phosphorus     Status: None   Collection Time: 08/05/14  5:50 AM  Result Value Ref Range   Phosphorus 2.5 2.5 - 4.6 mg/dL  Prealbumin     Status: Abnormal   Collection Time: 08/05/14  5:50 AM  Result Value Ref Range   Prealbumin 6.6 (L) 18 - 38 mg/dL     Studies/Results: No results found.  Marland Kitchen  amLODipine  5 mg Oral Daily  . antiseptic oral rinse  7 mL Mouth Rinse q12n4p  . aspirin EC  81 mg Oral Daily  . bisacodyl  10 mg Rectal BID  . carvedilol  12.5 mg Oral BID WC  . chlorhexidine  15 mL Mouth Rinse BID  . enoxaparin (LOVENOX) injection  40 mg Subcutaneous Q24H  . insulin aspart  0-9 Units Subcutaneous Q4H  . losartan  100 mg Oral Daily  . potassium chloride  10 mEq Intravenous Q1 Hr x 5  . simvastatin  20 mg Oral QHS     Assessment/Plan: s/p Procedure(s): EXPLORATORY LAPAROTOMY LYSIS OF ADHESIONS  POD #3. Laparotomy with lysis of adhesions for single band adhesion. Dr. Georgette Dover. 08/02/2014. Still has ileus but passing flatus.  allow limited clear liquids. Dc foley Reduce narcotic Ambulate more  Borderline hypokalemia. We'll give KCl runs. Check labs tomorrow  Diabetes mellitus. Blood sugars need better control. Goal would be 120-180 with current surgical guidelines.. Defer to [facility] hospitalist.  Protein calorie malnutrition. On TNA given over a week of nothing by mouth status  DVT prophylaxis. On Lovenox and SCDs.  @PROBHOSP @  LOS: 11 days    [name] M 08/05/2014  . .prob

## 2014-08-06 LAB — CBC
HCT: 22 % — ABNORMAL LOW (ref 39.0–52.0)
HEMOGLOBIN: 7.8 g/dL — AB (ref 13.0–17.0)
MCH: 30.6 pg (ref 26.0–34.0)
MCHC: 35.5 g/dL (ref 30.0–36.0)
MCV: 86.3 fL (ref 78.0–100.0)
PLATELETS: 210 10*3/uL (ref 150–400)
RBC: 2.55 MIL/uL — AB (ref 4.22–5.81)
RDW: 14.2 % (ref 11.5–15.5)
WBC: 9.2 10*3/uL (ref 4.0–10.5)

## 2014-08-06 LAB — BASIC METABOLIC PANEL
Anion gap: 7 (ref 5–15)
BUN: 14 mg/dL (ref 6–20)
CO2: 28 mmol/L (ref 22–32)
Calcium: 8.6 mg/dL — ABNORMAL LOW (ref 8.9–10.3)
Chloride: 98 mmol/L — ABNORMAL LOW (ref 101–111)
Creatinine, Ser: 0.64 mg/dL (ref 0.61–1.24)
GFR calc Af Amer: >60 mL/min (ref >60)
GLUCOSE: 168 mg/dL — AB (ref 70–99)
POTASSIUM: 3 mmol/L — AB (ref 3.5–5.1)
Sodium: 133 mmol/L — ABNORMAL LOW (ref 135–145)

## 2014-08-06 LAB — GLUCOSE, CAPILLARY
GLUCOSE-CAPILLARY: 126 mg/dL — AB (ref 70–99)
GLUCOSE-CAPILLARY: 155 mg/dL — AB (ref 70–99)
GLUCOSE-CAPILLARY: 157 mg/dL — AB (ref 70–99)
GLUCOSE-CAPILLARY: 163 mg/dL — AB (ref 70–99)
GLUCOSE-CAPILLARY: 207 mg/dL — AB (ref 70–99)
Glucose-Capillary: 156 mg/dL — ABNORMAL HIGH (ref 70–99)

## 2014-08-06 LAB — MAGNESIUM: Magnesium: 2 mg/dL (ref 1.7–2.4)

## 2014-08-06 LAB — PHOSPHORUS: Phosphorus: 2.3 mg/dL — ABNORMAL LOW (ref 2.5–4.6)

## 2014-08-06 MED ORDER — SODIUM CHLORIDE 0.9 % IV SOLN
10.0000 mmol | Freq: Once | INTRAVENOUS | Status: AC
  Administered 2014-08-06: 10 mmol via INTRAVENOUS
  Filled 2014-08-06: qty 3.33

## 2014-08-06 MED ORDER — FUROSEMIDE 20 MG PO TABS
20.0000 mg | ORAL_TABLET | Freq: Every day | ORAL | Status: AC
  Administered 2014-08-06 – 2014-08-08 (×3): 20 mg via ORAL
  Filled 2014-08-06 (×3): qty 1

## 2014-08-06 MED ORDER — FAT EMULSION 20 % IV EMUL
250.0000 mL | INTRAVENOUS | Status: AC
  Administered 2014-08-06: 250 mL via INTRAVENOUS
  Filled 2014-08-06: qty 250

## 2014-08-06 MED ORDER — TRACE MINERALS CR-CU-F-FE-I-MN-MO-SE-ZN IV SOLN
INTRAVENOUS | Status: AC
  Administered 2014-08-06: 18:00:00 via INTRAVENOUS
  Filled 2014-08-06: qty 1992

## 2014-08-06 MED ORDER — IPRATROPIUM-ALBUTEROL 0.5-2.5 (3) MG/3ML IN SOLN: 3.0000 mL | RESPIRATORY_TRACT | Status: DC | PRN

## 2014-08-06 MED ORDER — POTASSIUM CHLORIDE 10 MEQ/50ML IV SOLN
10.0000 meq | INTRAVENOUS | Status: AC
  Administered 2014-08-06 (×6): 10 meq via INTRAVENOUS
  Filled 2014-08-06 (×7): qty 50

## 2014-08-06 NOTE — Progress Notes (Signed)
[facility] NOTE Pharmacy Consult for TPN Indication: ileus  Allergies  Allergen Reactions  . Hctz [Hydrochlorothiazide] Other (See Comments)    Hyponatremia     Patient Measurements: Height: 5' 9.5" (176.5 cm) Weight: 194 lb 8 oz (88.225 kg) IBW/kg (Calculated) : 71.85    Vital Signs: Temp: 98.8 F (37.1 C) (05/11 0608) Temp Source: Oral (05/10 2213) BP: 132/65 mmHg (05/11 0608) Pulse Rate: 69 (05/11 0608) Intake/Output from previous day: 05/10 0701 - 05/11 0700 In: 2070 [P.O.:720; I.V.:10; TPN:1340] Out: 1950 [WUJWJ:1914] Intake/Output from this shift:    Labs:  Recent Labs  08/05/14 0550 08/06/14 0405  WBC 9.4 9.2  HGB 8.2* 7.8*  HCT 23.2* 22.0*  PLT 186 210     Recent Labs  08/04/14 0405 08/05/14 0500 08/05/14 0550 08/06/14 0405  NA 131*  --  134* 133*  K 3.3*  --  3.5 3.0*  CL 99*  --  100* 98*  CO2 23  --  22 28  GLUCOSE 143*  --  226* 168*  BUN 11  --  13 14  CREATININE 0.79  --  0.77 0.64  CALCIUM 8.3*  --  8.6* 8.6*  MG  --   --  1.8 2.0  PHOS  --   --  2.5 2.3*  PROT  --   --  5.8*  --   ALBUMIN  --   --  2.9*  --   AST  --   --  25  --   ALT  --   --  23  --   ALKPHOS  --   --  72  --   BILITOT  --   --  1.2  --   PREALBUMIN  --   --  6.6*  --   TRIG  --  53  --   --    Estimated Creatinine Clearance: 91.2 mL/min (by C-G formula based on Cr of 0.64).    Recent Labs  08/06/14 0001 08/06/14 0408 08/06/14 0739  GLUCAP 126* 157* 207*    Medical History: Past Medical History  Diagnosis Date  . CAD (coronary artery disease)   . History of acute inferior wall MI   . Diabetes mellitus   . HTN (hypertension)   . Dyslipidemia   . 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 BPH (benign prostatic hyperplasia)   . 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 Hyponatremia      Insulin Requirements in the past 24 hours:  20 units SSI  Current Nutrition:  Clear liquid TPN with Clinimix E 5/15 at 60 ml/hr plus IVFE at 10  ml/hr  Assessment: 74 yo M POD #4 ex lap with LOA for single band adhesion.  Has ileus post-op.  Pharmacy consulted to start TPN 5/9 for nutrition support as pt has been over a week with NPO status.   GI:  flatus, 1 BM, 720 mls clear liquids in; .  Endocrine:  hx DM,  20 units SSI/24 hours; 30 units insulin added to TPN yesterday; CBGs after TPN with insulin hung: 224, 126, 168, 207;  goal 120 - 180 per surgical guidelines Lytes: k 3.0, after 5 runs of K and lasix 40 IV x1 yesterday; na 133., phos 2.3, mag 2 after 2 gm bolus (goal > = 2 with ileus) Renal: creat WNL, lasix 40 IV x 1 yesterday; Hepatic: LFTs WNL, trig 53, t bili 1.2 Nutritional Goals: per units RD note 5/9  2000-2200 kCal,  105-115 grams of protein per day Access:  PICC placed for TPN 5/9 TPN day: start 5/9  Plan:  -increase Clinimix E 5/15 to goal rate of 83 ml/hr plus IVFE at 10 ml/hr.  This will provide 100 gm protein and 1894 kcal which is ~ 95% of goal. -continue SSI. Increase insulin in TPN to 45 units/2 liter bag - 6 runs of K and 10 mM Naphos for repletion -f/u diet, may advance from clear to full liquids -TPN labs in am  Eudelia Bunch, Pharm.D. [phone] 08/06/2014 8:24 AM

## 2014-08-06 NOTE — Progress Notes (Signed)
Patient ID: [name], male   DOB: Mar 23, 1941, 74 y.o.   MRN: [id number] 4 Days Post-Op  Subjective: Minimal flatus, mild nausea, still bloated.  Mobilized TID yesterday  Objective: Vital signs in last 24 hours: Temp:  [97.3 F (36.3 C)-99 F (37.2 C)] 98.8 F (37.1 C) (05/11 0608) Pulse Rate:  [69-79] 69 (05/11 0608) Resp:  [16-20] 16 (05/11 0608) BP: (132-151)/(59-65) 132/65 mmHg (05/11 0608) SpO2:  [91 %-94 %] 94 % (05/11 0608) Weight:  [88.225 kg (194 lb 8 oz)] 88.225 kg (194 lb 8 oz) (05/11 0608) Last BM Date: 08/05/14  Intake/Output from previous day: 05/10 0701 - 05/11 0700 In: 2070 [P.O.:720; I.V.:10; TPN:1340] Out: 1950 [Urine:1950] Intake/Output this shift: Total I/O In: 240 [P.O.:240] Out: 250 [Urine:250]  PE: Abd: soft, but still distended, few BS, incision, c/d/i with staples, appropriately tender Heart: regular Lungs: CTAB  Lab Results:   Recent Labs  08/05/14 0550 08/06/14 0405  WBC 9.4 9.2  HGB 8.2* 7.8*  HCT 23.2* 22.0*  PLT 186 210   BMET  Recent Labs  08/05/14 0550 08/06/14 0405  NA 134* 133*  K 3.5 3.0*  CL 100* 98*  CO2 22 28  GLUCOSE 226* 168*  BUN 13 14  CREATININE 0.77 0.64  CALCIUM 8.6* 8.6*   PT/INR No results for input(s): LABPROT, INR in the last 72 hours. CMP     Component Value Date/Time   NA 133* 08/06/2014 0405   K 3.0* 08/06/2014 0405   CL 98* 08/06/2014 0405   CO2 28 08/06/2014 0405   GLUCOSE 168* 08/06/2014 0405   BUN 14 08/06/2014 0405   CREATININE 0.64 08/06/2014 0405   CALCIUM 8.6* 08/06/2014 0405   PROT 5.8* 08/05/2014 0550   ALBUMIN 2.9* 08/05/2014 0550   AST 25 08/05/2014 0550   ALT 23 08/05/2014 0550   ALKPHOS 72 08/05/2014 0550   BILITOT 1.2 08/05/2014 0550   GFRNONAA >60 08/06/2014 0405   GFRAA >60 08/06/2014 0405   Lipase     Component Value Date/Time   LIPASE 31 07/25/2014 1828       Studies/Results: No results found.  Anti-infectives: Anti-infectives    Start     Dose/Rate  Route Frequency Ordered Stop   08/03/14 2200  azithromycin (ZITHROMAX) 500 mg in dextrose 5 % 250 mL IVPB     500 mg 250 mL/hr over 60 Minutes Intravenous Every 24 hours 08/03/14 0928 08/03/14 2339   08/03/14 2200  cefTRIAXone (ROCEPHIN) 1 g in dextrose 5 % 50 mL IVPB     1 g 100 mL/hr over 30 Minutes Intravenous Every 24 hours 08/03/14 0928 08/03/14 2308   08/02/14 1550  ceFAZolin (ANCEF) 2-3 GM-% IVPB SOLR    Comments:  Marinda Elk   : cabinet override      08/02/14 1550 08/02/14 1615   07/30/14 0800  ceFAZolin (ANCEF) IVPB 2 g/50 mL premix     2 g 100 mL/hr over 30 Minutes Intravenous To Surgery 07/30/14 0746 07/31/14 0800   07/25/14 2245  cefTRIAXone (ROCEPHIN) 1 g in dextrose 5 % 50 mL IVPB  Status:  Discontinued     1 g 100 mL/hr over 30 Minutes Intravenous Every 24 hours 07/25/14 2234 08/03/14 0928   07/25/14 2245  azithromycin (ZITHROMAX) 500 mg in dextrose 5 % 250 mL IVPB  Status:  Discontinued     500 mg 250 mL/hr over 60 Minutes Intravenous Every 24 hours 07/25/14 2234 08/03/14 0928       Assessment/Plan  POD4, s/p ex lap with LOA for single band adhesion, Dr. Georgette Dover 08-02-14 -cont NPO x ice today. Has ileus, await better bowel function -mobilize and pulm toilet -cont TNA for nutritional support DVT prophylaxis -lovenox/SCDs  LOS: 12 days    Jakhai Fant E 08/06/2014, 11:07 AM Pager: [phone]

## 2014-08-06 NOTE — Progress Notes (Signed)
[facility]                                                                              Patient Demographics  [name], is a 74 y.o. male, DOB - 11-24-1940, SWH:[id number]  Admit date - 07/25/2014   Admitting Physician Jani Gravel, MD  Outpatient Primary MD for the patient is  Melinda Crutch, MD  LOS - 12   Chief Complaint  Patient presents with  . Abdominal Pain       Brief HPI   Patient is a 74 year old male with CAD, diabetes type 2, hypertension, hyperlipidemia presented with abdominal pain and described as periumbilical, sharp with nausea and vomiting. He denied any fevers or chills, diarrhea or melena or hematochezia. Patient underwent CT of the abdomen and pelvis which showed partial small bowel traction. Patient was admitted for further workup.  Assessment & Plan    Principal Problem:   SBO (small bowel obstruction)- postop day # 4 - Status post exploratory laparoscopy with lysis of adhesions - Continue management per surgery recommendations - Started on TNA for nutrition, the patient is hoping to advance diet soon, does not like clear liquid diet  Active Problems: Dyspnea, wheezing; likely due to fluid overload, much improving today -  I's and O's still shows 15 L positive discontinued IV fluids yesterday, currently on TNA, BNP 1132, although feels better today, satting 94% on room air - Gave Lasix 40 mg IV 1 yesterday, will place on low-dose oral Lasix for 3 days, continue potassium replacement      CAD (coronary artery disease): - Currently stable, no chest pain or shortness of breath    Diabetes mellitus - Hemoglobin A1c 6.4, good outpatient glycemic control, likely hyperglycemia due to starting TNA. Continue sliding scale insulin moderate, may need to add basal insulin if BS continues to rise.    HTN (hypertension) - Well-controlled, continue Norvasc, Coreg  Questionable left pleural effusion on CT abdomen, CAP on CXR.  - No coughing or  respiratory symptoms, afebrile. Chest x-ray showed patchy infiltrates in the upper lobe and left lower lobe - Does not need any further antibiotics, on day #8, completed antibiotics for Zithromax/Rocephin -  repeat chest x-ray in 4-6 weeks to ensure complete resolution of pneumonia.  Normocytic anemia - Currently stable  Code Status: Full code   Family Communication: Discussed in detail with the patient, all imaging results, lab results explained to the patient and and wife at bedside  Disposition Plan: not medically ready  Time Spent in minutes  25 minutes  Procedures  abd Xray CT abd  Consults   Surgery  DVT Prophylaxis  SCD's  Medications  Scheduled Meds: . amLODipine  5 mg Oral Daily  . aspirin EC  81 mg Oral Daily  . carvedilol  12.5 mg Oral BID WC  . enoxaparin (LOVENOX) injection  40 mg Subcutaneous Q24H  . insulin aspart  0-15 Units Subcutaneous 6 times per day  . losartan  100 mg Oral Daily  . potassium chloride  10 mEq Intravenous Q1 Hr x 6  . simvastatin  20 mg Oral QHS  . sodium phosphate  Dextrose  5% IVPB  10 mmol Intravenous Once   Continuous Infusions: . Marland KitchenTPN (CLINIMIX-E) Adult 60 mL/hr at 08/05/14 1724   And  . fat emulsion 250 mL (08/05/14 1724)  . Marland KitchenTPN (CLINIMIX-E) Adult     And  . fat emulsion     PRN Meds:.acetaminophen **OR** acetaminophen, ALPRAZolam, HYDROmorphone (DILAUDID) injection, ipratropium-albuterol, nitroGLYCERIN, ondansetron (ZOFRAN) IV, sodium chloride   Antibiotics   Anti-infectives    Start     Dose/Rate Route Frequency Ordered Stop   08/03/14 2200  azithromycin (ZITHROMAX) 500 mg in dextrose 5 % 250 mL IVPB     500 mg 250 mL/hr over 60 Minutes Intravenous Every 24 hours 08/03/14 0928 08/03/14 2339   08/03/14 2200  cefTRIAXone (ROCEPHIN) 1 g in dextrose 5 % 50 mL IVPB     1 g 100 mL/hr over 30 Minutes Intravenous Every 24 hours 08/03/14 0928 08/03/14 2308   08/02/14 1550  ceFAZolin (ANCEF) 2-3 GM-% IVPB SOLR    Comments:   Tommi Rumps, Scot   : cabinet override      08/02/14 1550 08/02/14 1615   07/30/14 0800  ceFAZolin (ANCEF) IVPB 2 g/50 mL premix     2 g 100 mL/hr over 30 Minutes Intravenous To Surgery 07/30/14 0746 07/31/14 0800   07/25/14 2245  cefTRIAXone (ROCEPHIN) 1 g in dextrose 5 % 50 mL IVPB  Status:  Discontinued     1 g 100 mL/hr over 30 Minutes Intravenous Every 24 hours 07/25/14 2234 08/03/14 0928   07/25/14 2245  azithromycin (ZITHROMAX) 500 mg in dextrose 5 % 250 mL IVPB  Status:  Discontinued     500 mg 250 mL/hr over 60 Minutes Intravenous Every 24 hours 07/25/14 2234 08/03/14 5573        Subjective:   Jerryl Holzhauer was seen and examined today.  feels better today with the dyspnea. However still bloated, slight nausea, hates clear liquid diet, states had 2 small BM. Denies any chest pain, shortness of breath, new weakness or any numbness or tingling. Afebrile   Objective:   Blood pressure 132/65, pulse 69, temperature 98.8 F (37.1 C), temperature source Oral, resp. rate 16, height 5' 9.5" (1.765 m), weight 88.225 kg (194 lb 8 oz), SpO2 94 %.  Wt Readings from Last 3 Encounters:  08/06/14 88.225 kg (194 lb 8 oz)  01/24/14 81.375 kg (179 lb 6.4 oz)  05/27/13 81.194 kg (179 lb)     Intake/Output Summary (Last 24 hours) at 08/06/14 1216 Last data filed at 08/06/14 0900  Gross per 24 hour  Intake   2070 ml  Output   1150 ml  Net    920 ml    Exam  General: Alert and oriented x 3, NAD  HEENT:  PERRLA, EOMI  Neck: Supple, no JVD  CVS: S1 S2 clear, no mrg  RespiratoryDecreased breath sounds at the bases but no wheezing  Abdomen : Soft,  Staples intact , hypoactive bowel sounds  Ext: no c/c/e bilateral lower extremities  Neuro: no new focal neurological deficits  Skin: No rashes  Psych: Alert and oriented 3   Data Review   Micro Results Recent Results (from the past 240 hour(s))  Surgical pcr screen     Status: None   Collection Time: 07/30/14  8:18 AM   Result Value Ref Range Status   MRSA, PCR NEGATIVE NEGATIVE Final   Staphylococcus aureus NEGATIVE NEGATIVE Final    Comment:        The Xpert SA Assay (FDA approved for  NASAL specimens in patients over 41 years of age), is one component of a comprehensive surveillance program.  Test performance has been validated by [facility] for patients greater than or equal to 75 year old. It is not intended to diagnose infection nor to guide or monitor treatment.   MRSA PCR Screening     Status: None   Collection Time: 08/02/14  2:32 PM  Result Value Ref Range Status   MRSA by PCR NEGATIVE NEGATIVE Final    Comment:        The GeneXpert MRSA Assay (FDA approved for NASAL specimens only), is one component of a comprehensive MRSA colonization surveillance program. It is not intended to diagnose MRSA infection nor to guide or monitor treatment for MRSA infections.     Radiology Reports Dg Chest 2 View  07/26/2014   CLINICAL DATA:  Pleural effusion.  EXAM: CHEST  2 VIEW  COMPARISON:  02/17/2012  FINDINGS: Patient's nasogastric tube in place, tip coiled back upon itself into the lower esophagus. Status post median sternotomy and CABG. Heart is enlarged. There is mild interstitial edema. There patchy infiltrates within the left upper lobe and left lower lobe. Left pleural effusion. Persistent wedge compression of T10, unchanged. Mild dilatation of small bowel loops, unchanged.  IMPRESSION: 1. Cardiomegaly and mild edema. 2. Left upper lobe and left lower lobe infiltrates associated with left pleural effusion. 3. Nasogastric tube coiled back upon itself into lower esophagus. 4. The salient findings were discussed with Lordis on 07/26/2014 at 8:04 pm.   Electronically Signed   By: Nolon Nations M.D.   On: 07/26/2014 20:04   Ct Abdomen Pelvis W Contrast  07/25/2014   CLINICAL DATA:  Mid to lower abdominal pain since 12 hours ago. Vomiting.  EXAM: CT ABDOMEN AND PELVIS WITH CONTRAST  TECHNIQUE:  Multidetector CT imaging of the abdomen and pelvis was performed using the standard protocol following bolus administration of intravenous contrast.  CONTRAST:  141mL OMNIPAQUE IOHEXOL 300 MG/ML  SOLN  COMPARISON:  None.  FINDINGS: There is a left effusion layering dependently with patchy density in the dependent left lung that could be atelectasis or pneumonia.  The liver shows a pattern raising the possibility of early cirrhosis. There are 2 benign calcifications in the left lobe. There are calcified stones in the gallbladder neck. No stone is seen along the course of the common duct. The spleen is normal. There are bilateral low-density adrenal adenomas. The kidneys are normal. The aorta and its branch vessels show extensive atherosclerotic disease. No aneurysm. Bladder, prostate gland and seminal vesicles are unremarkable.  There are dilated fluid and air-filled loops of small intestine consistent with partial small bowel obstruction. There is a caliber change at the mid ileum. Specific obstructing lesion is not identified. There is free intraperitoneal fluid but no free intraperitoneal air are identified. Old vertebral augmentation at T10. Chronic bilateral pars defects at L5.  IMPRESSION: Partial small bowel obstruction. Level of the obstruction is probably mid ileum. Specific etiology not demonstrated.  Free fluid which could relate to the small bowel obstruction or could be secondary to cirrhosis. The patient does appear to have cirrhosis of the liver.  Gallstones.  Advanced widespread atherosclerosis.  Left pleural effusion. Atelectasis and/or pneumonia in the dependent left lower lobe.   Electronically Signed   By: Nelson Chimes M.D.   On: 07/25/2014 21:28   Dg Abd 2 Views  08/02/2014   CLINICAL DATA:  74 year old male with a history of small bowel obstruction  EXAM: ABDOMEN - 2 VIEW  COMPARISON:  08/01/2014, 07/30/2014, 07/29/2014, CT 07/25/2014  FINDINGS: Multiple borderline dilated small bowel loops  within the abdomen on the supine image. Upright image demonstrates air-fluid levels.  Paucity of distal colonic gas.  Surgical changes of prior median sternotomy, as well as surgical clips of the left upper quadrant.  Stones within the gallbladder again visualized in the right upper quadrant.  No displaced fracture.  Vascular calcifications.  IMPRESSION: Evidence of persisting small bowel obstruction, with multiple borderline dilated small bowel loops and paucity of colonic gas.  Signed,  Dulcy Fanny. Earleen Newport, DO  [facility]  [facility]   Electronically Signed   By: Corrie Mckusick D.O.   On: 08/02/2014 07:31   Dg Abd 2 Views  08/01/2014   CLINICAL DATA:  Follow-up of small bowel obstruction.  EXAM: ABDOMEN - 2 VIEW  COMPARISON:  Supine and upright abdominal films of Jul 30, 2014  FINDINGS: There has been interval removal of the esophagogastric tube. There remain loops of mildly distended gas and fluid-filled small bowel in the midline. The volume of gas present has decreased somewhat. There is some gas and fluid within the colon. Previously demonstrated colonic contrast has cleared. A small amount of air is present within the stomach. No definite extraluminal gas collections are demonstrated.  IMPRESSION: Persistent mid to distal relatively high-grade small bowel obstruction. Interval removal of the esophagogastric tube.  If the patient's clinical status has deteriorated, abdominal and pelvic CT scanning now would be useful.   Electronically Signed   By: Hannan  Martinique M.D.   On: 08/01/2014 10:38   Dg Abd 2 Views  07/30/2014   CLINICAL DATA:  Small-bowel obstruction.  EXAM: ABDOMEN - 2 VIEW  COMPARISON:  07/29/2014 .  FINDINGS: NG tube in stable position. Soft tissue structures are unremarkable. Persistent dilated loops of small bowel are again noted consistent with small bowel obstruction. Oral contrast again noted colon. No free air. Prior median sternotomy. Surgical  clips left upper quadrant.  IMPRESSION: Persistent unchanged dilated loops of small bowel consistent with small bowel obstruction. Oral contrast is noted in the colon. No free air. NG tube in stable position.   Electronically Signed   By: Marcello Moores  Register   On: 07/30/2014 07:11   Dg Abd 2 Views  07/28/2014   CLINICAL DATA:  Ileus, nausea.  EXAM: ABDOMEN - 2 VIEW  COMPARISON:  07/27/2014  FINDINGS: NG tube is present in the stomach. There are dilated small bowel loops with air-fluid levels. Degree of small bowel dilatation has increased since prior study. Colon is decompressed. No free air organomegaly. No suspicious calcification. Vascular calcifications noted in the aorta and iliac vessels. No acute bony abnormality.  IMPRESSION: Worsening small bowel dilatation compatible with worsening small bowel obstruction.   Electronically Signed   By: Rolm Baptise M.D.   On: 07/28/2014 08:57   Dg Abd Portable 1v  07/29/2014   CLINICAL DATA:  24 hour delay with Gastrografin. Small bowel protocol.  EXAM: PORTABLE ABDOMEN - 1 VIEW  COMPARISON:  07/28/2014  FINDINGS: Persistent gaseous distended loops of small bowel are demonstrated within the central abdomen measuring up to 3.5 cm, compatible with small bowel obstruction. Oral contrast material is demonstrated to the right lower quadrant, within the cecum and proximal ascending colon. NG tube projects over the left upper quadrant. Left upper quadrant surgical clips. Lower lumbar spine degenerative changes.  IMPRESSION: Oral contrast material demonstrated within the right lower quadrant, likely  within the cecum and ascending colon.  Persistent gaseous distended loops of small bowel, compatible with obstruction.   Electronically Signed   By: Lovey Newcomer M.D.   On: 07/29/2014 13:40   Dg Abd Portable 1v-small Bowel Obstruction Protocol-initial, 8 Hr Delay  07/29/2014   CLINICAL DATA:  Small bowel obstruction  EXAM: PORTABLE ABDOMEN - 1 VIEW  COMPARISON:  07/28/2014  FINDINGS:  The nasogastric tube extends into the proximal stomach with the side-port in the region of the EG junction.  There is persistent dilatation and stacking of small bowel loops consistent with small bowel obstruction. No free air is evident. No interval change is evident.  IMPRESSION: Persistent small bowel obstruction.   Electronically Signed   By: Andreas Newport M.D.   On: 07/29/2014 02:27   Dg Abd Portable 1v  07/27/2014   CLINICAL DATA:  Small-bowel obstruction.  EXAM: PORTABLE ABDOMEN - 1 VIEW  COMPARISON:  07/25/2014  FINDINGS: Nasogastric tube is partially imaged, tip overlying the level of the stomach. There are persistent dilated small bowel loops in the central abdomen. Nondilated loops of large bowel persist. There is a small amount of residual contrast in the bladder.  IMPRESSION: 1. Persistent small bowel dilatation. 2. Residual contrast in the bladder.   Electronically Signed   By: Nolon Nations M.D.   On: 07/27/2014 09:10   Dg Abd Portable 1v  07/25/2014   CLINICAL DATA:  Abdominal pain.  Nausea and vomiting for 1 day.  EXAM: PORTABLE ABDOMEN - 1 VIEW  COMPARISON:  CT of earlier in the day  FINDINGS: Single supine portable view. Contrast within the urinary bladder. Gastric distension with possible nasogastric tube, incompletely imaged. Proximal small bowel loops measure up to 3.2 cm. Gas and stool within the colon. Advanced atherosclerosis.  IMPRESSION: Partial small bowel obstruction, without free intraperitoneal air or other acute complication.   Electronically Signed   By: Abigail Miyamoto M.D.   On: 07/25/2014 23:46    CBC  Recent Labs Lab 08/02/14 0423 08/05/14 0550 08/06/14 0405  WBC 7.3 9.4 9.2  HGB 9.0* 8.2* 7.8*  HCT 25.9* 23.2* 22.0*  PLT 202 186 210  MCV 88.7 88.2 86.3  MCH 30.8 31.2 30.6  MCHC 34.7 35.3 35.5  RDW 14.5 14.5 14.2    Chemistries   Recent Labs Lab 08/02/14 0423 08/03/14 0412 08/04/14 0405 08/05/14 0550 08/06/14 0405  NA 130* 131* 131* 134* 133*   K 3.5 3.5 3.3* 3.5 3.0*  CL 99* 99* 99* 100* 98*  CO2 20* 23 23 22 28   GLUCOSE 144* 171* 143* 226* 168*  BUN 13 10 11 13 14   CREATININE 0.80 0.78 0.79 0.77 0.64  CALCIUM 8.6* 8.3* 8.3* 8.6* 8.6*  MG  --   --   --  1.8 2.0  AST  --   --   --  25  --   ALT  --   --   --  23  --   ALKPHOS  --   --   --  72  --   BILITOT  --   --   --  1.2  --    ------------------------------------------------------------------------------------------------------------------ estimated creatinine clearance is 91.2 mL/min (by C-G formula based on Cr of 0.64). ------------------------------------------------------------------------------------------------------------------ No results for input(s): HGBA1C in the last 72 hours. ------------------------------------------------------------------------------------------------------------------  Recent Labs  08/05/14 0500  TRIG 53   ------------------------------------------------------------------------------------------------------------------ No results for input(s): TSH, T4TOTAL, T3FREE, THYROIDAB in the last 72 hours.  Invalid input(s): FREET3 ------------------------------------------------------------------------------------------------------------------ No results  for input(s): VITAMINB12, FOLATE, FERRITIN, TIBC, IRON, RETICCTPCT in the last 72 hours.  Coagulation profile No results for input(s): INR, PROTIME in the last 168 hours.  No results for input(s): DDIMER in the last 72 hours.  Cardiac Enzymes No results for input(s): CKMB, TROPONINI, MYOGLOBIN in the last 168 hours.  Invalid input(s): CK ------------------------------------------------------------------------------------------------------------------ Invalid input(s): Watkins  08/05/14 1149 08/05/14 1626 08/05/14 2005 08/06/14 0001 08/06/14 0408 08/06/14 0739  GLUCAP 212* 241* 224* 126* 157* 207*     Teodor Prater M.D. [facility] 08/06/2014, 12:16  PM  Pager: [phone]   Between 7am to 7pm - call Pager - [phone]  After 7pm go to [email or web address] - password [email or web address]  Call night coverage person covering after 7pm

## 2014-08-07 LAB — GLUCOSE, CAPILLARY
Glucose-Capillary: 133 mg/dL — ABNORMAL HIGH (ref 65–99)
Glucose-Capillary: 138 mg/dL — ABNORMAL HIGH (ref 65–99)
Glucose-Capillary: 152 mg/dL — ABNORMAL HIGH (ref 65–99)
Glucose-Capillary: 152 mg/dL — ABNORMAL HIGH (ref 65–99)
Glucose-Capillary: 165 mg/dL — ABNORMAL HIGH (ref 65–99)
Glucose-Capillary: 168 mg/dL — ABNORMAL HIGH (ref 65–99)

## 2014-08-07 LAB — COMPREHENSIVE METABOLIC PANEL
ALT: 45 U/L (ref 17–63)
AST: 53 U/L — AB (ref 15–41)
Albumin: 2.6 g/dL — ABNORMAL LOW (ref 3.5–5.0)
Alkaline Phosphatase: 102 U/L (ref 38–126)
Anion gap: 7 (ref 5–15)
BUN: 17 mg/dL (ref 6–20)
CALCIUM: 8.4 mg/dL — AB (ref 8.9–10.3)
CO2: 26 mmol/L (ref 22–32)
Chloride: 96 mmol/L — ABNORMAL LOW (ref 101–111)
Creatinine, Ser: 0.69 mg/dL (ref 0.61–1.24)
GFR calc non Af Amer: >60 mL/min (ref >60)
Glucose, Bld: 163 mg/dL — ABNORMAL HIGH (ref 65–99)
Potassium: 3.5 mmol/L (ref 3.5–5.1)
Sodium: 129 mmol/L — ABNORMAL LOW (ref 135–145)
TOTAL PROTEIN: 5.7 g/dL — AB (ref 6.5–8.1)
Total Bilirubin: 0.9 mg/dL (ref 0.3–1.2)

## 2014-08-07 LAB — CBC
HCT: 21.8 % — ABNORMAL LOW (ref 39.0–52.0)
Hemoglobin: 7.8 g/dL — ABNORMAL LOW (ref 13.0–17.0)
MCH: 31.1 pg (ref 26.0–34.0)
MCHC: 35.8 g/dL (ref 30.0–36.0)
MCV: 86.9 fL (ref 78.0–100.0)
PLATELETS: 228 10*3/uL (ref 150–400)
RBC: 2.51 MIL/uL — ABNORMAL LOW (ref 4.22–5.81)
RDW: 14.4 % (ref 11.5–15.5)
WBC: 8.9 10*3/uL (ref 4.0–10.5)

## 2014-08-07 LAB — TRIGLYCERIDES: Triglycerides: 44 mg/dL (ref <150)

## 2014-08-07 LAB — PHOSPHORUS: Phosphorus: 3.3 mg/dL (ref 2.5–4.6)

## 2014-08-07 LAB — MAGNESIUM: Magnesium: 1.8 mg/dL (ref 1.7–2.4)

## 2014-08-07 MED ORDER — MAGNESIUM SULFATE 2 GM/50ML IV SOLN
2.0000 g | Freq: Once | INTRAVENOUS | Status: AC
  Administered 2014-08-07: 2 g via INTRAVENOUS
  Filled 2014-08-07: qty 50

## 2014-08-07 MED ORDER — ENOXAPARIN SODIUM 40 MG/0.4ML ~~LOC~~ SOLN
40.0000 mg | Freq: Every day | SUBCUTANEOUS | Status: DC
  Administered 2014-08-07 – 2014-08-09 (×3): 40 mg via SUBCUTANEOUS
  Filled 2014-08-07 (×3): qty 0.4

## 2014-08-07 MED ORDER — FAT EMULSION 20 % IV EMUL
250.0000 mL | INTRAVENOUS | Status: DC
  Filled 2014-08-07: qty 250

## 2014-08-07 MED ORDER — OXYCODONE-ACETAMINOPHEN 5-325 MG PO TABS: 1.0000 | ORAL_TABLET | ORAL | Status: DC | PRN

## 2014-08-07 MED ORDER — POTASSIUM CHLORIDE 10 MEQ/50ML IV SOLN
10.0000 meq | INTRAVENOUS | Status: AC
  Administered 2014-08-07 (×4): 10 meq via INTRAVENOUS
  Filled 2014-08-07 (×4): qty 50

## 2014-08-07 MED ORDER — INSULIN ASPART 100 UNIT/ML ~~LOC~~ SOLN
0.0000 [IU] | Freq: Three times a day (TID) | SUBCUTANEOUS | Status: DC
  Administered 2014-08-08 – 2014-08-09 (×4): 3 [IU] via SUBCUTANEOUS
  Administered 2014-08-09: 5 [IU] via SUBCUTANEOUS
  Administered 2014-08-09: 3 [IU] via SUBCUTANEOUS
  Administered 2014-08-10: 8 [IU] via SUBCUTANEOUS
  Administered 2014-08-10: 3 [IU] via SUBCUTANEOUS

## 2014-08-07 MED ORDER — INSULIN ASPART 100 UNIT/ML ~~LOC~~ SOLN: 0.0000 [IU] | Freq: Every day | SUBCUTANEOUS | Status: DC

## 2014-08-07 MED ORDER — TRACE MINERALS CR-CU-F-FE-I-MN-MO-SE-ZN IV SOLN
INTRAVENOUS | Status: DC
  Filled 2014-08-07: qty 1992

## 2014-08-07 NOTE — Progress Notes (Signed)
Nutrition Follow-up  DOCUMENTATION CODES:  Not applicable  INTERVENTION:  TPN, Other (Comment) (RD to follow for diet advancement)  NUTRITION DIAGNOSIS:  Inadequate oral intake related to altered GI function as evidenced by other (see comment) (clear liquid diet, on TPN).  Ongoing  GOAL:  Patient will meet greater than or equal to 90% of their needs  Goal met with TPN   MONITOR:  PO intake, Weight trends, Labs, Diet advancement, Skin, I & O's, Other (Comment) (TPN adequacy/tolerance)  REASON FOR ASSESSMENT:  Consult, Other (Comment) (NPO/clear liquids >7 days) New TPN/TNA  ASSESSMENT: Patient is a 74 year old male with CAD, diabetes type 2, hypertension, hyperlipidemia presented with abdominal pain and described as periumbilical, sharp with nausea and vomiting. He denied any fevers or chills, diarrhea or melena or hematochezia. Patient underwent CT of the abdomen and pelvis which showed partial small bowel traction. Patient was admitted for further workup.  Pt admitted with SBO. S/p ex lap with lysis of adhesions on 08/02/14.  Pt in with MD at time of visit. He was advanced from NPO to a clear liquid diet on 08/05/14. Per MD notes, pt does not like clear liquid diet and is requesting diet advancement. Meal completion 50-75%. Pt remains on TPN- per pharmacy notes, Clinimix E 5/15 is running at goal rate of 83 ml/hr plus IVFE at 10 ml/hr. This will provide 100 gm protein and 1894 kcal which meets 95% of estimated kcal needs and 97% of estimated protein needs.  Labs reviewed. Nas: 129, Cl: 96, Creat: 8.4, Glucose: 163. CBGS: 133-165. TPN likely a contributing factor to hy[erglycemia. Per surgery notes, glycemic goals are 120-180 per surgical guidelines; deferring management of hyperglycemia to hospitalist.   Height:  Ht Readings from Last 1 Encounters:  07/25/14 5' 9.5" (1.765 m)    Weight:  Wt Readings from Last 1 Encounters:  08/07/14 193 lb 15.2 oz (87.975 kg)     Ideal Body Weight:  74 kg  Wt Readings from Last 10 Encounters:  08/07/14 193 lb 15.2 oz (87.975 kg)  01/24/14 179 lb 6.4 oz (81.375 kg)  05/27/13 179 lb (81.194 kg)  11/15/12 180 lb 3.2 oz (81.738 kg)  04/19/12 173 lb 3.2 oz (78.563 kg)  08/18/11 178 lb (80.74 kg)  07/20/11 180 lb 4.8 oz (81.784 kg)  02/23/11 188 lb 12.8 oz (85.639 kg)    BMI:  Body mass index is 28.24 kg/(m^2). Overweight  Estimated Nutritional Needs:  Kcal:  2000-2200  Protein:  105-115 grams  Fluid:  2.0-2.2 L  Skin:  Reviewed, no issues (closed abdominal incision)  Diet Order:  Diet clear liquid Room service appropriate?: Yes; Fluid consistency:: Thin; Fluid restriction:: 1200 mL Fluid TPN (CLINIMIX-E) Adult  EDUCATION NEEDS:  Education needs no appropriate at this time   Intake/Output Summary (Last 24 hours) at 08/07/14 1010 Last data filed at 08/07/14 0945  Gross per 24 hour  Intake 2888.54 ml  Output    601 ml  Net 2287.54 ml    Last BM:  08/06/14  Luane Rochon A. Jimmye Norman, RD, LDN, CDE Pager: [phone] After hours Pager: [phone]

## 2014-08-07 NOTE — Progress Notes (Signed)
[facility]                                                                              Patient Demographics  [name], is a 74 y.o. male, DOB - 04-04-1940, IDP:[id number]  Admit date - 07/25/2014   Admitting Physician Jani Gravel, MD  Outpatient Primary MD for the patient is  Melinda Crutch, MD  LOS - 29   Chief Complaint  Patient presents with  . Abdominal Pain       Brief HPI   Patient is a 74 year old male with CAD, diabetes type 2, hypertension, hyperlipidemia presented with abdominal pain and described as periumbilical, sharp with nausea and vomiting. He denied any fevers or chills, diarrhea or melena or hematochezia. Patient underwent CT of the abdomen and pelvis which showed partial small bowel traction. Patient was admitted for further workup.  Assessment & Plan    Principal Problem:   SBO (small bowel obstruction)- postop day # 5, feeling better today, less bloated, had a bowel movement this morning - Status post exploratory laparoscopy with lysis of adhesions - Continue management per surgery recommendations - On TNA for nutrition, patient's wife frustrated with the clear liquid diet, appreciate surgery for advancing diet to full liquids  Active Problems: Dyspnea, wheezing; likely due to fluid overload, does not feel as short of breath -Continue Lasix    CAD (coronary artery disease): - Currently stable, no chest pain or shortness of breath    Diabetes mellitus - Hemoglobin A1c 6.4, good outpatient glycemic control, likely hyperglycemia due to starting TNA. Continue sliding scale insulin moderate, may need to add basal insulin if BS continues to rise.    HTN (hypertension) - Well-controlled, continue Norvasc, Coreg  Questionable left pleural effusion on CT abdomen, CAP on CXR.  - No coughing or respiratory symptoms, afebrile. Chest x-ray showed patchy infiltrates in the upper lobe and left lower lobe - Does not need any further antibiotics, on  day #8, completed antibiotics for Zithromax/Rocephin -  repeat chest x-ray in 4-6 weeks to ensure complete resolution of pneumonia.  Normocytic anemia - Currently stable  Code Status: Full code   Family Communication: Discussed in detail with the patient, all imaging results, lab results explained to the patient and and wife at bedside  Disposition Plan: not medically ready  Time Spent in minutes  25 minutes  Procedures  abd Xray CT abd  Consults   Surgery  DVT Prophylaxis  SCD's  Medications  Scheduled Meds: . amLODipine  5 mg Oral Daily  . aspirin EC  81 mg Oral Daily  . carvedilol  12.5 mg Oral BID WC  . enoxaparin (LOVENOX) injection  40 mg Subcutaneous QHS  . furosemide  20 mg Oral Daily  . insulin aspart  0-15 Units Subcutaneous 6 times per day  . losartan  100 mg Oral Daily  . potassium chloride  10 mEq Intravenous Q1 Hr x 4  . simvastatin  20 mg Oral QHS   Continuous Infusions: . Marland KitchenTPN (CLINIMIX-E) Adult 83 mL/hr at 08/06/14 1759   And  . fat emulsion 250 mL (08/06/14 1759)   PRN Meds:.acetaminophen **OR** acetaminophen,  ALPRAZolam, HYDROmorphone (DILAUDID) injection, ipratropium-albuterol, nitroGLYCERIN, ondansetron (ZOFRAN) IV, oxyCODONE-acetaminophen, sodium chloride   Antibiotics   Anti-infectives    Start     Dose/Rate Route Frequency Ordered Stop   08/03/14 2200  azithromycin (ZITHROMAX) 500 mg in dextrose 5 % 250 mL IVPB     500 mg 250 mL/hr over 60 Minutes Intravenous Every 24 hours 08/03/14 0928 08/03/14 2339   08/03/14 2200  cefTRIAXone (ROCEPHIN) 1 g in dextrose 5 % 50 mL IVPB     1 g 100 mL/hr over 30 Minutes Intravenous Every 24 hours 08/03/14 0928 08/03/14 2308   08/02/14 1550  ceFAZolin (ANCEF) 2-3 GM-% IVPB SOLR    Comments:  Tommi Rumps, Scot   : cabinet override      08/02/14 1550 08/02/14 1615   07/30/14 0800  ceFAZolin (ANCEF) IVPB 2 g/50 mL premix     2 g 100 mL/hr over 30 Minutes Intravenous To Surgery 07/30/14 0746 07/31/14 0800    07/25/14 2245  cefTRIAXone (ROCEPHIN) 1 g in dextrose 5 % 50 mL IVPB  Status:  Discontinued     1 g 100 mL/hr over 30 Minutes Intravenous Every 24 hours 07/25/14 2234 08/03/14 0928   07/25/14 2245  azithromycin (ZITHROMAX) 500 mg in dextrose 5 % 250 mL IVPB  Status:  Discontinued     500 mg 250 mL/hr over 60 Minutes Intravenous Every 24 hours 07/25/14 2234 08/03/14 0240        Subjective:   [name] was seen and examined today.  Less nauseous, less bloated had a bowel movement this morning. Wife at the bedside is frustrated with the clear liquid diet. Denied any chest pain, shortness of breath, new weakness or any numbness or tingling. Afebrile   Objective:   Blood pressure 155/78, pulse 78, temperature 98.3 F (36.8 C), temperature source Oral, resp. rate 17, height 5' 9.5" (1.765 m), weight 87.975 kg (193 lb 15.2 oz), SpO2 96 %.  Wt Readings from Last 3 Encounters:  08/07/14 87.975 kg (193 lb 15.2 oz)  01/24/14 81.375 kg (179 lb 6.4 oz)  05/27/13 81.194 kg (179 lb)     Intake/Output Summary (Last 24 hours) at 08/07/14 1328 Last data filed at 08/07/14 0945  Gross per 24 hour  Intake 2648.54 ml  Output    601 ml  Net 2047.54 ml    Exam  General: Alert and oriented x 3, NAD  HEENT:  PERRLA, EOMI  Neck: Supple, no JVD  CVS: S1 S2 clear, no mrg  Respiratory;  decreased breath sounds at the bases  Abdomen : Soft,  Staples intact ,+ BS  Ext: no c/c/e   Neuro: no new focal neurological deficits  Skin: No rashes  Psych: Alert and oriented 3   Data Review   Micro Results Recent Results (from the past 240 hour(s))  Surgical pcr screen     Status: None   Collection Time: 07/30/14  8:18 AM  Result Value Ref Range Status   MRSA, PCR NEGATIVE NEGATIVE Final   Staphylococcus aureus NEGATIVE NEGATIVE Final    Comment:        The Xpert SA Assay (FDA approved for NASAL specimens in patients over 52 years of age), is one component of a comprehensive  surveillance program.  Test performance has been validated by [facility] for patients greater than or equal to 66 year old. It is not intended to diagnose infection nor to guide or monitor treatment.   MRSA PCR Screening     Status: None  Collection Time: 08/02/14  2:32 PM  Result Value Ref Range Status   MRSA by PCR NEGATIVE NEGATIVE Final    Comment:        The GeneXpert MRSA Assay (FDA approved for NASAL specimens only), is one component of a comprehensive MRSA colonization surveillance program. It is not intended to diagnose MRSA infection nor to guide or monitor treatment for MRSA infections.     Radiology Reports Dg Chest 2 View  07/26/2014   CLINICAL DATA:  Pleural effusion.  EXAM: CHEST  2 VIEW  COMPARISON:  02/17/2012  FINDINGS: Patient's nasogastric tube in place, tip coiled back upon itself into the lower esophagus. Status post median sternotomy and CABG. Heart is enlarged. There is mild interstitial edema. There patchy infiltrates within the left upper lobe and left lower lobe. Left pleural effusion. Persistent wedge compression of T10, unchanged. Mild dilatation of small bowel loops, unchanged.  IMPRESSION: 1. Cardiomegaly and mild edema. 2. Left upper lobe and left lower lobe infiltrates associated with left pleural effusion. 3. Nasogastric tube coiled back upon itself into lower esophagus. 4. The salient findings were discussed with Lordis on 07/26/2014 at 8:04 pm.   Electronically Signed   By: Nolon Nations M.D.   On: 07/26/2014 20:04   Ct Abdomen Pelvis W Contrast  07/25/2014   CLINICAL DATA:  Mid to lower abdominal pain since 12 hours ago. Vomiting.  EXAM: CT ABDOMEN AND PELVIS WITH CONTRAST  TECHNIQUE: Multidetector CT imaging of the abdomen and pelvis was performed using the standard protocol following bolus administration of intravenous contrast.  CONTRAST:  113mL OMNIPAQUE IOHEXOL 300 MG/ML  SOLN  COMPARISON:  None.  FINDINGS: There is a left effusion layering  dependently with patchy density in the dependent left lung that could be atelectasis or pneumonia.  The liver shows a pattern raising the possibility of early cirrhosis. There are 2 benign calcifications in the left lobe. There are calcified stones in the gallbladder neck. No stone is seen along the course of the common duct. The spleen is normal. There are bilateral low-density adrenal adenomas. The kidneys are normal. The aorta and its branch vessels show extensive atherosclerotic disease. No aneurysm. Bladder, prostate gland and seminal vesicles are unremarkable.  There are dilated fluid and air-filled loops of small intestine consistent with partial small bowel obstruction. There is a caliber change at the mid ileum. Specific obstructing lesion is not identified. There is free intraperitoneal fluid but no free intraperitoneal air are identified. Old vertebral augmentation at T10. Chronic bilateral pars defects at L5.  IMPRESSION: Partial small bowel obstruction. Level of the obstruction is probably mid ileum. Specific etiology not demonstrated.  Free fluid which could relate to the small bowel obstruction or could be secondary to cirrhosis. The patient does appear to have cirrhosis of the liver.  Gallstones.  Advanced widespread atherosclerosis.  Left pleural effusion. Atelectasis and/or pneumonia in the dependent left lower lobe.   Electronically Signed   By: Nelson Chimes M.D.   On: 07/25/2014 21:28   Dg Abd 2 Views  08/02/2014   CLINICAL DATA:  74 year old male with a history of small bowel obstruction  EXAM: ABDOMEN - 2 VIEW  COMPARISON:  08/01/2014, 07/30/2014, 07/29/2014, CT 07/25/2014  FINDINGS: Multiple borderline dilated small bowel loops within the abdomen on the supine image. Upright image demonstrates air-fluid levels.  Paucity of distal colonic gas.  Surgical changes of prior median sternotomy, as well as surgical clips of the left upper quadrant.  Stones within the gallbladder  again visualized in  the right upper quadrant.  No displaced fracture.  Vascular calcifications.  IMPRESSION: Evidence of persisting small bowel obstruction, with multiple borderline dilated small bowel loops and paucity of colonic gas.  Signed,  Dulcy Fanny. Earleen Newport, DO  [facility]  [facility]   Electronically Signed   By: Corrie Mckusick D.O.   On: 08/02/2014 07:31   Dg Abd 2 Views  08/01/2014   CLINICAL DATA:  Follow-up of small bowel obstruction.  EXAM: ABDOMEN - 2 VIEW  COMPARISON:  Supine and upright abdominal films of Jul 30, 2014  FINDINGS: There has been interval removal of the esophagogastric tube. There remain loops of mildly distended gas and fluid-filled small bowel in the midline. The volume of gas present has decreased somewhat. There is some gas and fluid within the colon. Previously demonstrated colonic contrast has cleared. A small amount of air is present within the stomach. No definite extraluminal gas collections are demonstrated.  IMPRESSION: Persistent mid to distal relatively high-grade small bowel obstruction. Interval removal of the esophagogastric tube.  If the patient's clinical status has deteriorated, abdominal and pelvic CT scanning now would be useful.   Electronically Signed   By: Yoskar  Martinique M.D.   On: 08/01/2014 10:38   Dg Abd 2 Views  07/30/2014   CLINICAL DATA:  Small-bowel obstruction.  EXAM: ABDOMEN - 2 VIEW  COMPARISON:  07/29/2014 .  FINDINGS: NG tube in stable position. Soft tissue structures are unremarkable. Persistent dilated loops of small bowel are again noted consistent with small bowel obstruction. Oral contrast again noted colon. No free air. Prior median sternotomy. Surgical clips left upper quadrant.  IMPRESSION: Persistent unchanged dilated loops of small bowel consistent with small bowel obstruction. Oral contrast is noted in the colon. No free air. NG tube in stable position.   Electronically Signed   By: Marcello Moores  Register   On:  07/30/2014 07:11   Dg Abd 2 Views  07/28/2014   CLINICAL DATA:  Ileus, nausea.  EXAM: ABDOMEN - 2 VIEW  COMPARISON:  07/27/2014  FINDINGS: NG tube is present in the stomach. There are dilated small bowel loops with air-fluid levels. Degree of small bowel dilatation has increased since prior study. Colon is decompressed. No free air organomegaly. No suspicious calcification. Vascular calcifications noted in the aorta and iliac vessels. No acute bony abnormality.  IMPRESSION: Worsening small bowel dilatation compatible with worsening small bowel obstruction.   Electronically Signed   By: Rolm Baptise M.D.   On: 07/28/2014 08:57   Dg Abd Portable 1v  07/29/2014   CLINICAL DATA:  24 hour delay with Gastrografin. Small bowel protocol.  EXAM: PORTABLE ABDOMEN - 1 VIEW  COMPARISON:  07/28/2014  FINDINGS: Persistent gaseous distended loops of small bowel are demonstrated within the central abdomen measuring up to 3.5 cm, compatible with small bowel obstruction. Oral contrast material is demonstrated to the right lower quadrant, within the cecum and proximal ascending colon. NG tube projects over the left upper quadrant. Left upper quadrant surgical clips. Lower lumbar spine degenerative changes.  IMPRESSION: Oral contrast material demonstrated within the right lower quadrant, likely within the cecum and ascending colon.  Persistent gaseous distended loops of small bowel, compatible with obstruction.   Electronically Signed   By: Lovey Newcomer M.D.   On: 07/29/2014 13:40   Dg Abd Portable 1v-small Bowel Obstruction Protocol-initial, 8 Hr Delay  07/29/2014   CLINICAL DATA:  Small bowel obstruction  EXAM: PORTABLE ABDOMEN - 1  VIEW  COMPARISON:  07/28/2014  FINDINGS: The nasogastric tube extends into the proximal stomach with the side-port in the region of the EG junction.  There is persistent dilatation and stacking of small bowel loops consistent with small bowel obstruction. No free air is evident. No interval change is  evident.  IMPRESSION: Persistent small bowel obstruction.   Electronically Signed   By: Andreas Newport M.D.   On: 07/29/2014 02:27   Dg Abd Portable 1v  07/27/2014   CLINICAL DATA:  Small-bowel obstruction.  EXAM: PORTABLE ABDOMEN - 1 VIEW  COMPARISON:  07/25/2014  FINDINGS: Nasogastric tube is partially imaged, tip overlying the level of the stomach. There are persistent dilated small bowel loops in the central abdomen. Nondilated loops of large bowel persist. There is a small amount of residual contrast in the bladder.  IMPRESSION: 1. Persistent small bowel dilatation. 2. Residual contrast in the bladder.   Electronically Signed   By: Nolon Nations M.D.   On: 07/27/2014 09:10   Dg Abd Portable 1v  07/25/2014   CLINICAL DATA:  Abdominal pain.  Nausea and vomiting for 1 day.  EXAM: PORTABLE ABDOMEN - 1 VIEW  COMPARISON:  CT of earlier in the day  FINDINGS: Single supine portable view. Contrast within the urinary bladder. Gastric distension with possible nasogastric tube, incompletely imaged. Proximal small bowel loops measure up to 3.2 cm. Gas and stool within the colon. Advanced atherosclerosis.  IMPRESSION: Partial small bowel obstruction, without free intraperitoneal air or other acute complication.   Electronically Signed   By: Abigail Miyamoto M.D.   On: 07/25/2014 23:46    CBC  Recent Labs Lab 08/02/14 0423 08/05/14 0550 08/06/14 0405 08/07/14 0530  WBC 7.3 9.4 9.2 8.9  HGB 9.0* 8.2* 7.8* 7.8*  HCT 25.9* 23.2* 22.0* 21.8*  PLT 202 186 210 228  MCV 88.7 88.2 86.3 86.9  MCH 30.8 31.2 30.6 31.1  MCHC 34.7 35.3 35.5 35.8  RDW 14.5 14.5 14.2 14.4    Chemistries   Recent Labs Lab 08/03/14 0412 08/04/14 0405 08/05/14 0550 08/06/14 0405 08/07/14 0530  NA 131* 131* 134* 133* 129*  K 3.5 3.3* 3.5 3.0* 3.5  CL 99* 99* 100* 98* 96*  CO2 23 23 22 28 26   GLUCOSE 171* 143* 226* 168* 163*  BUN 10 11 13 14 17   CREATININE 0.78 0.79 0.77 0.64 0.69  CALCIUM 8.3* 8.3* 8.6* 8.6* 8.4*  MG   --   --  1.8 2.0 1.8  AST  --   --  25  --  53*  ALT  --   --  23  --  45  ALKPHOS  --   --  72  --  102  BILITOT  --   --  1.2  --  0.9   ------------------------------------------------------------------------------------------------------------------ estimated creatinine clearance is 91.1 mL/min (by C-G formula based on Cr of 0.69). ------------------------------------------------------------------------------------------------------------------ No results for input(s): HGBA1C in the last 72 hours. ------------------------------------------------------------------------------------------------------------------  Recent Labs  08/05/14 0500 08/07/14 0530  TRIG 53 44   ------------------------------------------------------------------------------------------------------------------ No results for input(s): TSH, T4TOTAL, T3FREE, THYROIDAB in the last 72 hours.  Invalid input(s): FREET3 ------------------------------------------------------------------------------------------------------------------ No results for input(s): VITAMINB12, FOLATE, FERRITIN, TIBC, IRON, RETICCTPCT in the last 72 hours.  Coagulation profile No results for input(s): INR, PROTIME in the last 168 hours.  No results for input(s): DDIMER in the last 72 hours.  Cardiac Enzymes No results for input(s): CKMB, TROPONINI, MYOGLOBIN in the last 168 hours.  Invalid input(s): CK ------------------------------------------------------------------------------------------------------------------  Invalid input(s): [place]  08/06/14 1620 08/06/14 2014 08/07/14 0051 08/07/14 0412 08/07/14 0748 08/07/14 1142  GLUCAP 155* 163* 133* 138* 165* 168*     Izadora Roehr M.D. [facility] 08/07/2014, 1:28 PM  Pager: [phone]   Between 7am to 7pm - call Pager - [phone]  After 7pm go to [email or web address] - password [email or web address]  Call night coverage person covering after 7pm

## 2014-08-07 NOTE — Care Management (Signed)
UR updated.  

## 2014-08-07 NOTE — Progress Notes (Signed)
Patient ID: [name], male   DOB: 06-16-1940, 74 y.o.   MRN: [id number] 5 Days Post-Op  Subjective: Pt passed a lot of flatus and had a BM this am.  Tolerating clear liquids with no nausea.    Objective: Vital signs in last 24 hours: Temp:  [97.4 F (36.3 C)-98.7 F (37.1 C)] 98.3 F (36.8 C) (05/12 0644) Pulse Rate:  [70-78] 78 (05/12 0644) Resp:  [16-17] 17 (05/12 0644) BP: (145-155)/(68-78) 155/78 mmHg (05/12 0644) SpO2:  [94 %-97 %] 96 % (05/12 0644) Weight:  [87.975 kg (193 lb 15.2 oz)] 87.975 kg (193 lb 15.2 oz) (05/12 0644) Last BM Date: 08/06/14  Intake/Output from previous day: 05/11 0701 - 05/12 0700 In: 3128.5 [P.O.:1080; TPN:2048.5] Out: 850 [Urine:850] Intake/Output this shift: Total I/O In: -  Out: 1 [Stool:1]  PE: Abd: soft, less distended, more BS, appropriately tender, incision c/d/i  Lab Results:   Recent Labs  08/06/14 0405 08/07/14 0530  WBC 9.2 8.9  HGB 7.8* 7.8*  HCT 22.0* 21.8*  PLT 210 228   BMET  Recent Labs  08/06/14 0405 08/07/14 0530  NA 133* 129*  K 3.0* 3.5  CL 98* 96*  CO2 28 26  GLUCOSE 168* 163*  BUN 14 17  CREATININE 0.64 0.69  CALCIUM 8.6* 8.4*   PT/INR No results for input(s): LABPROT, INR in the last 72 hours. CMP     Component Value Date/Time   NA 129* 08/07/2014 0530   K 3.5 08/07/2014 0530   CL 96* 08/07/2014 0530   CO2 26 08/07/2014 0530   GLUCOSE 163* 08/07/2014 0530   BUN 17 08/07/2014 0530   CREATININE 0.69 08/07/2014 0530   CALCIUM 8.4* 08/07/2014 0530   PROT 5.7* 08/07/2014 0530   ALBUMIN 2.6* 08/07/2014 0530   AST 53* 08/07/2014 0530   ALT 45 08/07/2014 0530   ALKPHOS 102 08/07/2014 0530   BILITOT 0.9 08/07/2014 0530   GFRNONAA >60 08/07/2014 0530   GFRAA >60 08/07/2014 0530   Lipase     Component Value Date/Time   LIPASE 31 07/25/2014 1828       Studies/Results: No results found.  Anti-infectives: Anti-infectives    Start     Dose/Rate Route Frequency Ordered Stop   08/03/14 2200  azithromycin (ZITHROMAX) 500 mg in dextrose 5 % 250 mL IVPB     500 mg 250 mL/hr over 60 Minutes Intravenous Every 24 hours 08/03/14 0928 08/03/14 2339   08/03/14 2200  cefTRIAXone (ROCEPHIN) 1 g in dextrose 5 % 50 mL IVPB     1 g 100 mL/hr over 30 Minutes Intravenous Every 24 hours 08/03/14 0928 08/03/14 2308   08/02/14 1550  ceFAZolin (ANCEF) 2-3 GM-% IVPB SOLR    Comments:  Marinda Elk   : cabinet override      08/02/14 1550 08/02/14 1615   07/30/14 0800  ceFAZolin (ANCEF) IVPB 2 g/50 mL premix     2 g 100 mL/hr over 30 Minutes Intravenous To Surgery 07/30/14 0746 07/31/14 0800   07/25/14 2245  cefTRIAXone (ROCEPHIN) 1 g in dextrose 5 % 50 mL IVPB  Status:  Discontinued     1 g 100 mL/hr over 30 Minutes Intravenous Every 24 hours 07/25/14 2234 08/03/14 0928   07/25/14 2245  azithromycin (ZITHROMAX) 500 mg in dextrose 5 % 250 mL IVPB  Status:  Discontinued     500 mg 250 mL/hr over 60 Minutes Intravenous Every 24 hours 07/25/14 2234 08/03/14 3825  Assessment/Plan   POD5, s/p ex lap with LOA for single band adhesion, Dr. Georgette Dover 08-02-14 -pt was on clear liquids already.  Will advance to full liquids -mobilize and pulm toilet -can wean TNA DVT prophylaxis -lovenox/SCDs  LOS: 13 days    Janiyla Long E 08/07/2014, 10:18 AM Pager: [phone]

## 2014-08-07 NOTE — Progress Notes (Signed)
[facility] NOTE Pharmacy Consult for TPN Indication: ileus  Allergies  Allergen Reactions  . Hctz [Hydrochlorothiazide] Other (See Comments)    Hyponatremia     Patient Measurements: Height: 5' 9.5" (176.5 cm) Weight: 193 lb 15.2 oz (87.975 kg) IBW/kg (Calculated) : 71.85    Vital Signs: Temp: 98.3 F (36.8 C) (05/12 0644) Temp Source: Oral (05/11 2245) BP: 155/78 mmHg (05/12 0644) Pulse Rate: 78 (05/12 0644) Intake/Output from previous day: 05/11 0701 - 05/12 0700 In: 3128.5 [P.O.:1080; TPN:2048.5] Out: 850 [Urine:850] Intake/Output from this shift: Total I/O In: -  Out: 1 [Stool:1]  Labs:  Recent Labs  08/05/14 0550 08/06/14 0405 08/07/14 0530  WBC 9.4 9.2 8.9  HGB 8.2* 7.8* 7.8*  HCT 23.2* 22.0* 21.8*  PLT 186 210 228     Recent Labs  08/05/14 0500 08/05/14 0550 08/06/14 0405 08/07/14 0530  NA  --  134* 133* 129*  K  --  3.5 3.0* 3.5  CL  --  100* 98* 96*  CO2  --  22 28 26   GLUCOSE  --  226* 168* 163*  BUN  --  13 14 17   CREATININE  --  0.77 0.64 0.69  CALCIUM  --  8.6* 8.6* 8.4*  MG  --  1.8 2.0 1.8  PHOS  --  2.5 2.3* 3.3  PROT  --  5.8*  --  5.7*  ALBUMIN  --  2.9*  --  2.6*  AST  --  25  --  53*  ALT  --  23  --  45  ALKPHOS  --  72  --  102  BILITOT  --  1.2  --  0.9  PREALBUMIN  --  6.6*  --   --   TRIG 53  --   --  44   Estimated Creatinine Clearance: 91.1 mL/min (by C-G formula based on Cr of 0.69).    Recent Labs  08/07/14 0051 08/07/14 0412 08/07/14 0748  GLUCAP 133* 138* 165*    Medical History: Past Medical History  Diagnosis Date  . CAD (coronary artery disease)   . History of acute inferior wall MI   . Diabetes mellitus   . HTN (hypertension)   . Dyslipidemia   . 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 BPH (benign prostatic hyperplasia)   . 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 Hyponatremia      Insulin Requirements in the past 24 hours:  10 units SSI since current TPN hung, 45 units  insulin in TPN  Current Nutrition:  Clear liquid TPN with Clinimix E 5/15 at 83 ml/hr plus IVFE at 10 ml/hr  Assessment: 74 yo M POD #5 ex lap with LOA for single band adhesion.  Has ileus post-op.  Pharmacy consulted to start TPN 5/9 for nutrition support as pt has been over a week with NPO status.   GI:  flatus, 1 BM 5/10 and 5/12, 1080 mls clear liquids in; .  Endocrine:  hx DM,  CBGs after insulin increased to 45 units/bag: 163, 133, 138, 165  goal 120 - 180 per surgical guidelines Lytes:   Na 129,  k 3.5, after 6 runs of K and lasix 20 po x1 yesterday (goal > 4 for ileus); phos 3.3 after 10 mM phos bolus, mag 1.8 (goal > = 2 with ileus) Renal: creat WNL, lasix 20 po daily started 5/11 Hepatic: LFTs WNL, trig 44, t bili wnl Nutritional Goals: per units RD note 5/9  2000-2200 kCal,  105-115 grams of protein per day Access:  PICC placed for TPN 5/9 TPN day: start 5/9  Plan:  -to wean TPN to off per CCS - at 1300 reduce TPN rate to 1/2 = 40 ml/hr and run until 1800 then stop completely . -continue SSI.   - 2 gm of mag followed by 4 runs of K for repletion -bmet, mag and phos in am  Pharmacy to sing off thanks  Eudelia Bunch, Pharm.D. [phone] 08/07/2014 10:04 AM

## 2014-08-08 DIAGNOSIS — I5033 Acute on chronic diastolic (congestive) heart failure: Secondary | ICD-10-CM

## 2014-08-08 LAB — BASIC METABOLIC PANEL
ANION GAP: 8 (ref 5–15)
BUN: 16 mg/dL (ref 6–20)
CHLORIDE: 94 mmol/L — AB (ref 101–111)
CO2: 24 mmol/L (ref 22–32)
CREATININE: 0.78 mg/dL (ref 0.61–1.24)
Calcium: 8.2 mg/dL — ABNORMAL LOW (ref 8.9–10.3)
GFR calc non Af Amer: >60 mL/min (ref >60)
GLUCOSE: 173 mg/dL — AB (ref 65–99)
POTASSIUM: 4.2 mmol/L (ref 3.5–5.1)
SODIUM: 126 mmol/L — AB (ref 135–145)

## 2014-08-08 LAB — GLUCOSE, CAPILLARY
GLUCOSE-CAPILLARY: 162 mg/dL — AB (ref 65–99)
Glucose-Capillary: 167 mg/dL — ABNORMAL HIGH (ref 65–99)
Glucose-Capillary: 200 mg/dL — ABNORMAL HIGH (ref 65–99)
Glucose-Capillary: 198 mg/dL — ABNORMAL HIGH (ref 65–99)

## 2014-08-08 LAB — MAGNESIUM: MAGNESIUM: 1.9 mg/dL (ref 1.7–2.4)

## 2014-08-08 LAB — PHOSPHORUS: Phosphorus: 3.7 mg/dL (ref 2.5–4.6)

## 2014-08-08 LAB — BRAIN NATRIURETIC PEPTIDE: B Natriuretic Peptide: 1003 pg/mL — ABNORMAL HIGH (ref 0.0–100.0)

## 2014-08-08 MED ORDER — FUROSEMIDE 10 MG/ML IJ SOLN
40.0000 mg | Freq: Two times a day (BID) | INTRAMUSCULAR | Status: DC
  Administered 2014-08-08 – 2014-08-10 (×4): 40 mg via INTRAVENOUS
  Filled 2014-08-08 (×4): qty 4

## 2014-08-08 MED ORDER — FUROSEMIDE 10 MG/ML IJ SOLN
40.0000 mg | Freq: Once | INTRAMUSCULAR | Status: AC
  Administered 2014-08-08: 40 mg via INTRAVENOUS
  Filled 2014-08-08: qty 4

## 2014-08-08 NOTE — Progress Notes (Signed)
[facility]                                                                              Patient Demographics  [name], is a 74 y.o. male, DOB - 12-28-1940, ZRA:[id number]  Admit date - 07/25/2014   Admitting Physician Jani Gravel, MD  Outpatient Primary MD for the patient is  Melinda Crutch, MD  LOS - 13   Chief Complaint  Patient presents with  . Abdominal Pain       Brief HPI   Patient is a 74 year old male with CAD, diabetes type 2, hypertension, hyperlipidemia presented with abdominal pain and described as periumbilical, sharp with nausea and vomiting. He denied any fevers or chills, diarrhea or melena or hematochezia. Patient underwent CT of the abdomen and pelvis which showed partial small bowel traction. Patient was admitted for further workup.  Assessment & Plan    Principal Problem:   SBO (small bowel obstruction)- postop day # 6, feeling better today, less bloated, had 2 bowel movements today - Status post exploratory laparoscopy with lysis of adhesions - Continue management per surgery recommendations - DNA remained off, patient advanced to soft solids  Active Problems: Dyspnea, does not feel as short of breath, however significant peripheral edema likely due to CHF and fluid overload - I's and O's with 19 L positive, had been on fluids and TNA, TNA has been discontinued as patient's diet has been advanced Patient requested his cardiologist, Dr. Martinique to consult, cardiology consult was called, appreciate follow-up     CAD (coronary artery disease): - Currently stable, no chest pain or shortness of breath    Diabetes mellitus - Hemoglobin A1c 6.4, good outpatient glycemic control, - Likely patient was hyperglycemic due to TNA, which is being weaned off today, hopefully blood sugars will improve    HTN (hypertension) - Well-controlled, continue Norvasc, Coreg  Questionable left pleural effusion on CT abdomen, CAP on CXR.  - No coughing or  respiratory symptoms, afebrile. Chest x-ray showed patchy infiltrates in the upper lobe and left lower lobe - Does not need any further antibiotics, on day #8, completed antibiotics for Zithromax/Rocephin -  repeat chest x-ray in 4-6 weeks to ensure complete resolution of pneumonia.  Normocytic anemia - Currently stable  Code Status: Full code   Family Communication: Discussed in detail with the patient, all imaging results, lab results explained to the patient and and wife at bedside  Disposition Plan: not medically ready  Time Spent in minutes  25 minutes  Procedures  abd Xray CT abd  Consults   Surgery Cardiology   DVT Prophylaxis  SCD's  Medications  Scheduled Meds: . amLODipine  5 mg Oral Daily  . aspirin EC  81 mg Oral Daily  . carvedilol  12.5 mg Oral BID WC  . enoxaparin (LOVENOX) injection  40 mg Subcutaneous QHS  . furosemide  40 mg Intravenous BID  . insulin aspart  0-15 Units Subcutaneous TID WC  . insulin aspart  0-5 Units Subcutaneous QHS  . losartan  100 mg Oral Daily  . simvastatin  20 mg Oral QHS   Continuous Infusions:   PRN  Meds:.acetaminophen **OR** acetaminophen, ALPRAZolam, HYDROmorphone (DILAUDID) injection, ipratropium-albuterol, nitroGLYCERIN, ondansetron (ZOFRAN) IV, oxyCODONE-acetaminophen, sodium chloride   Antibiotics   Anti-infectives    Start     Dose/Rate Route Frequency Ordered Stop   08/03/14 2200  azithromycin (ZITHROMAX) 500 mg in dextrose 5 % 250 mL IVPB     500 mg 250 mL/hr over 60 Minutes Intravenous Every 24 hours 08/03/14 0928 08/03/14 2339   08/03/14 2200  cefTRIAXone (ROCEPHIN) 1 g in dextrose 5 % 50 mL IVPB     1 g 100 mL/hr over 30 Minutes Intravenous Every 24 hours 08/03/14 0928 08/03/14 2308   08/02/14 1550  ceFAZolin (ANCEF) 2-3 GM-% IVPB SOLR    Comments:  Tommi Rumps, Scot   : cabinet override      08/02/14 1550 08/02/14 1615   07/30/14 0800  ceFAZolin (ANCEF) IVPB 2 g/50 mL premix     2 g 100 mL/hr over 30  Minutes Intravenous To Surgery 07/30/14 0746 07/31/14 0800   07/25/14 2245  cefTRIAXone (ROCEPHIN) 1 g in dextrose 5 % 50 mL IVPB  Status:  Discontinued     1 g 100 mL/hr over 30 Minutes Intravenous Every 24 hours 07/25/14 2234 08/03/14 0928   07/25/14 2245  azithromycin (ZITHROMAX) 500 mg in dextrose 5 % 250 mL IVPB  Status:  Discontinued     500 mg 250 mL/hr over 60 Minutes Intravenous Every 24 hours 07/25/14 2234 08/03/14 0086        Subjective:   Toan Mort was seen and examined today.  patient feels better from his abdominal standpoint, less bloated, no nausea, had 2 bowel movements this morning, feels ready for solid diet Also has significant prepatellar edema, states worsened overnight. Denied any chest pain, shortness of breath, new weakness or any numbness or tingling. Afebrile   Objective:   Blood pressure 143/70, pulse 73, temperature 98.7 F (37.1 C), temperature source Oral, resp. rate 18, height 5' 9.5" (1.765 m), weight 94.167 kg (207 lb 9.6 oz), SpO2 96 %.  Wt Readings from Last 3 Encounters:  08/08/14 94.167 kg (207 lb 9.6 oz)  01/24/14 81.375 kg (179 lb 6.4 oz)  05/27/13 81.194 kg (179 lb)     Intake/Output Summary (Last 24 hours) at 08/08/14 1353 Last data filed at 08/07/14 1700  Gross per 24 hour  Intake   1154 ml  Output      1 ml  Net   1153 ml    Exam  General: Alert and oriented x 3, NAD  HEENT:  PERRLA, EOMI  Neck: Supple, no JVD  CVS: S1 S2 clear, no mrg  Respiratory;  decreased breath sounds at the bases  Abdomen : Soft,  midline incision clean, dry, staples intact, nontender ,+ BS  Ext:  no cyanosis or clubbing, 2+ pitting edema bilaterally lower extremities   Neuro: no new focal neurological deficits  Skin: No rashes  Psych: Alert and oriented 3   Data Review   Micro Results Recent Results (from the past 240 hour(s))  Surgical pcr screen     Status: None   Collection Time: 07/30/14  8:18 AM  Result Value Ref Range  Status   MRSA, PCR NEGATIVE NEGATIVE Final   Staphylococcus aureus NEGATIVE NEGATIVE Final    Comment:        The Xpert SA Assay (FDA approved for NASAL specimens in patients over 74 years of age), is one component of a comprehensive surveillance program.  Test performance has been validated by [facility] for patients  greater than or equal to 34 year old. It is not intended to diagnose infection nor to guide or monitor treatment.   MRSA PCR Screening     Status: None   Collection Time: 08/02/14  2:32 PM  Result Value Ref Range Status   MRSA by PCR NEGATIVE NEGATIVE Final    Comment:        The GeneXpert MRSA Assay (FDA approved for NASAL specimens only), is one component of a comprehensive MRSA colonization surveillance program. It is not intended to diagnose MRSA infection nor to guide or monitor treatment for MRSA infections.     Radiology Reports Dg Chest 2 View  07/26/2014   CLINICAL DATA:  Pleural effusion.  EXAM: CHEST  2 VIEW  COMPARISON:  02/17/2012  FINDINGS: Patient's nasogastric tube in place, tip coiled back upon itself into the lower esophagus. Status post median sternotomy and CABG. Heart is enlarged. There is mild interstitial edema. There patchy infiltrates within the left upper lobe and left lower lobe. Left pleural effusion. Persistent wedge compression of T10, unchanged. Mild dilatation of small bowel loops, unchanged.  IMPRESSION: 1. Cardiomegaly and mild edema. 2. Left upper lobe and left lower lobe infiltrates associated with left pleural effusion. 3. Nasogastric tube coiled back upon itself into lower esophagus. 4. The salient findings were discussed with [place] on 07/26/2014 at 8:04 pm.   Electronically Signed   By: Nolon Nations M.D.   On: 07/26/2014 20:04   Ct Abdomen Pelvis W Contrast  07/25/2014   CLINICAL DATA:  Mid to lower abdominal pain since 12 hours ago. Vomiting.  EXAM: CT ABDOMEN AND PELVIS WITH CONTRAST  TECHNIQUE: Multidetector CT imaging  of the abdomen and pelvis was performed using the standard protocol following bolus administration of intravenous contrast.  CONTRAST:  119mL OMNIPAQUE IOHEXOL 300 MG/ML  SOLN  COMPARISON:  None.  FINDINGS: There is a left effusion layering dependently with patchy density in the dependent left lung that could be atelectasis or pneumonia.  The liver shows a pattern raising the possibility of early cirrhosis. There are 2 benign calcifications in the left lobe. There are calcified stones in the gallbladder neck. No stone is seen along the course of the common duct. The spleen is normal. There are bilateral low-density adrenal adenomas. The kidneys are normal. The aorta and its branch vessels show extensive atherosclerotic disease. No aneurysm. Bladder, prostate gland and seminal vesicles are unremarkable.  There are dilated fluid and air-filled loops of small intestine consistent with partial small bowel obstruction. There is a caliber change at the mid ileum. Specific obstructing lesion is not identified. There is free intraperitoneal fluid but no free intraperitoneal air are identified. Old vertebral augmentation at T10. Chronic bilateral pars defects at L5.  IMPRESSION: Partial small bowel obstruction. Level of the obstruction is probably mid ileum. Specific etiology not demonstrated.  Free fluid which could relate to the small bowel obstruction or could be secondary to cirrhosis. The patient does appear to have cirrhosis of the liver.  Gallstones.  Advanced widespread atherosclerosis.  Left pleural effusion. Atelectasis and/or pneumonia in the dependent left lower lobe.   Electronically Signed   By: Nelson Chimes M.D.   On: 07/25/2014 21:28   Dg Abd 2 Views  08/02/2014   CLINICAL DATA:  74 year old male with a history of small bowel obstruction  EXAM: ABDOMEN - 2 VIEW  COMPARISON:  08/01/2014, 07/30/2014, 07/29/2014, CT 07/25/2014  FINDINGS: Multiple borderline dilated small bowel loops within the abdomen on the  supine image. Upright image demonstrates air-fluid levels.  Paucity of distal colonic gas.  Surgical changes of prior median sternotomy, as well as surgical clips of the left upper quadrant.  Stones within the gallbladder again visualized in the right upper quadrant.  No displaced fracture.  Vascular calcifications.  IMPRESSION: Evidence of persisting small bowel obstruction, with multiple borderline dilated small bowel loops and paucity of colonic gas.  Signed,  Dulcy Fanny. Earleen Newport, DO  [facility]  [facility]   Electronically Signed   By: Corrie Mckusick D.O.   On: 08/02/2014 07:31   Dg Abd 2 Views  08/01/2014   CLINICAL DATA:  Follow-up of small bowel obstruction.  EXAM: ABDOMEN - 2 VIEW  COMPARISON:  Supine and upright abdominal films of Jul 30, 2014  FINDINGS: There has been interval removal of the esophagogastric tube. There remain loops of mildly distended gas and fluid-filled small bowel in the midline. The volume of gas present has decreased somewhat. There is some gas and fluid within the colon. Previously demonstrated colonic contrast has cleared. A small amount of air is present within the stomach. No definite extraluminal gas collections are demonstrated.  IMPRESSION: Persistent mid to distal relatively high-grade small bowel obstruction. Interval removal of the esophagogastric tube.  If the patient's clinical status has deteriorated, abdominal and pelvic CT scanning now would be useful.   Electronically Signed   By: Jarriel  Martinique M.D.   On: 08/01/2014 10:38   Dg Abd 2 Views  07/30/2014   CLINICAL DATA:  Small-bowel obstruction.  EXAM: ABDOMEN - 2 VIEW  COMPARISON:  07/29/2014 .  FINDINGS: NG tube in stable position. Soft tissue structures are unremarkable. Persistent dilated loops of small bowel are again noted consistent with small bowel obstruction. Oral contrast again noted colon. No free air. Prior median sternotomy. Surgical clips left upper quadrant.   IMPRESSION: Persistent unchanged dilated loops of small bowel consistent with small bowel obstruction. Oral contrast is noted in the colon. No free air. NG tube in stable position.   Electronically Signed   By: Marcello Moores  Register   On: 07/30/2014 07:11   Dg Abd 2 Views  07/28/2014   CLINICAL DATA:  Ileus, nausea.  EXAM: ABDOMEN - 2 VIEW  COMPARISON:  07/27/2014  FINDINGS: NG tube is present in the stomach. There are dilated small bowel loops with air-fluid levels. Degree of small bowel dilatation has increased since prior study. Colon is decompressed. No free air organomegaly. No suspicious calcification. Vascular calcifications noted in the aorta and iliac vessels. No acute bony abnormality.  IMPRESSION: Worsening small bowel dilatation compatible with worsening small bowel obstruction.   Electronically Signed   By: Rolm Baptise M.D.   On: 07/28/2014 08:57   Dg Abd Portable 1v  07/29/2014   CLINICAL DATA:  24 hour delay with Gastrografin. Small bowel protocol.  EXAM: PORTABLE ABDOMEN - 1 VIEW  COMPARISON:  07/28/2014  FINDINGS: Persistent gaseous distended loops of small bowel are demonstrated within the central abdomen measuring up to 3.5 cm, compatible with small bowel obstruction. Oral contrast material is demonstrated to the right lower quadrant, within the cecum and proximal ascending colon. NG tube projects over the left upper quadrant. Left upper quadrant surgical clips. Lower lumbar spine degenerative changes.  IMPRESSION: Oral contrast material demonstrated within the right lower quadrant, likely within the cecum and ascending colon.  Persistent gaseous distended loops of small bowel, compatible with obstruction.   Electronically Signed   By: Lovey Newcomer  M.D.   On: 07/29/2014 13:40   Dg Abd Portable 1v-small Bowel Obstruction Protocol-initial, 8 Hr Delay  07/29/2014   CLINICAL DATA:  Small bowel obstruction  EXAM: PORTABLE ABDOMEN - 1 VIEW  COMPARISON:  07/28/2014  FINDINGS: The nasogastric tube  extends into the proximal stomach with the side-port in the region of the EG junction.  There is persistent dilatation and stacking of small bowel loops consistent with small bowel obstruction. No free air is evident. No interval change is evident.  IMPRESSION: Persistent small bowel obstruction.   Electronically Signed   By: Andreas Newport M.D.   On: 07/29/2014 02:27   Dg Abd Portable 1v  07/27/2014   CLINICAL DATA:  Small-bowel obstruction.  EXAM: PORTABLE ABDOMEN - 1 VIEW  COMPARISON:  07/25/2014  FINDINGS: Nasogastric tube is partially imaged, tip overlying the level of the stomach. There are persistent dilated small bowel loops in the central abdomen. Nondilated loops of large bowel persist. There is a small amount of residual contrast in the bladder.  IMPRESSION: 1. Persistent small bowel dilatation. 2. Residual contrast in the bladder.   Electronically Signed   By: Nolon Nations M.D.   On: 07/27/2014 09:10   Dg Abd Portable 1v  07/25/2014   CLINICAL DATA:  Abdominal pain.  Nausea and vomiting for 1 day.  EXAM: PORTABLE ABDOMEN - 1 VIEW  COMPARISON:  CT of earlier in the day  FINDINGS: Single supine portable view. Contrast within the urinary bladder. Gastric distension with possible nasogastric tube, incompletely imaged. Proximal small bowel loops measure up to 3.2 cm. Gas and stool within the colon. Advanced atherosclerosis.  IMPRESSION: Partial small bowel obstruction, without free intraperitoneal air or other acute complication.   Electronically Signed   By: Abigail Miyamoto M.D.   On: 07/25/2014 23:46    CBC  Recent Labs Lab 08/02/14 0423 08/05/14 0550 08/06/14 0405 08/07/14 0530  WBC 7.3 9.4 9.2 8.9  HGB 9.0* 8.2* 7.8* 7.8*  HCT 25.9* 23.2* 22.0* 21.8*  PLT 202 186 210 228  MCV 88.7 88.2 86.3 86.9  MCH 30.8 31.2 30.6 31.1  MCHC 34.7 35.3 35.5 35.8  RDW 14.5 14.5 14.2 14.4    Chemistries   Recent Labs Lab 08/04/14 0405 08/05/14 0550 08/06/14 0405 08/07/14 0530  08/08/14 0505  NA 131* 134* 133* 129* 126*  K 3.3* 3.5 3.0* 3.5 4.2  CL 99* 100* 98* 96* 94*  CO2 23 22 28 26 24   GLUCOSE 143* 226* 168* 163* 173*  BUN 11 13 14 17 16   CREATININE 0.79 0.77 0.64 0.69 0.78  CALCIUM 8.3* 8.6* 8.6* 8.4* 8.2*  MG  --  1.8 2.0 1.8 1.9  AST  --  25  --  53*  --   ALT  --  23  --  45  --   ALKPHOS  --  72  --  102  --   BILITOT  --  1.2  --  0.9  --    ------------------------------------------------------------------------------------------------------------------ estimated creatinine clearance is 94 mL/min (by C-G formula based on Cr of 0.78). ------------------------------------------------------------------------------------------------------------------ No results for input(s): HGBA1C in the last 72 hours. ------------------------------------------------------------------------------------------------------------------  Recent Labs  08/07/14 0530  TRIG 44   ------------------------------------------------------------------------------------------------------------------ No results for input(s): TSH, T4TOTAL, T3FREE, THYROIDAB in the last 72 hours.  Invalid input(s): FREET3 ------------------------------------------------------------------------------------------------------------------ No results for input(s): VITAMINB12, FOLATE, FERRITIN, TIBC, IRON, RETICCTPCT in the last 72 hours.  Coagulation profile No results for input(s): INR, PROTIME in the last 168 hours.  No  results for input(s): DDIMER in the last 72 hours.  Cardiac Enzymes No results for input(s): CKMB, TROPONINI, MYOGLOBIN in the last 168 hours.  Invalid input(s): CK ------------------------------------------------------------------------------------------------------------------ Invalid input(s): [place]  08/07/14 0748 08/07/14 1142 08/07/14 1643 08/07/14 1947 08/08/14 0733 08/08/14 [place]  M.D. [facility] 08/08/2014, 1:53 PM  Pager: [phone]   Between 7am to 7pm - call Pager - [phone]  After 7pm go to [email or web address] - password [email or web address]  Call night coverage person covering after 7pm

## 2014-08-08 NOTE — Consult Note (Signed)
CARDIOLOGY CONSULT NOTE   Patient ID: PAITON FOSCO MRN: [id number], DOB/AGE: Apr 03, 1940   Admit date: 07/25/2014 Date of Consult: 08/08/2014   Primary Physician:  Melinda Crutch, MD Primary Cardiologist: Dr. Martinique  Pt. Profile  74 year old Caucasian male with past medical history of CAD status post redo CABG in 2005 with free RIMA to OM, SVG to diagonal, SVG to PDA, old anterior MI, DM, HTN and HLD present with dyspnea after bowel surgery.   Problem List  Past Medical History  Diagnosis Date  . CAD (coronary artery disease)   . History of acute inferior wall MI   . Diabetes mellitus   . HTN (hypertension)   . Dyslipidemia   . 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 BPH (benign prostatic hyperplasia)   . 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 Hyponatremia     Past Surgical History  Procedure Laterality Date  . Coronary artery bypass graft  redo 2005    free Rima to OM, svg-diag,svg-pda  . Tonsillectomy    . Rotator cuff repair    . Melanoma excision    . Eye surgery      BILATERAL   . Kyphoplasty  02/29/2012    Procedure: KYPHOPLASTY;  Surgeon: Sinclair Ship, MD;  Location: [place];  Service: Orthopedics;  Laterality: Bilateral;  T 10 kyphoplasty  . Laparotomy N/A 08/02/2014    Procedure: EXPLORATORY LAPAROTOMY LYSIS OF ADHESIONS;  Surgeon: Donnie Mesa, MD;  Location: [place];  Service: General;  Laterality: N/A;     Allergies  Allergies  Allergen Reactions  . Hctz [Hydrochlorothiazide] Other (See Comments)    Hyponatremia     HPI   The patient is a pleasant 74 year old Caucasian male with past medical history of CAD status post redo CABG in 2005 with free RIMA to OM, SVG to diagonal, SVG to PDA, old anterior MI, DM, HTN and HLD. He was last seen in the cardiology clinic in December 2015 at which time he was doing well. His last Myoview in August 2010 showed inferolateral scar plus ischemia, normal LV function with EF 53%. since then, patient has been doing okay at  home. He denies any exertional chest discomfort. However for the past several months, he has been more dyspneic with exertion. He denies any strenuous activity in the last several month. He has attributed his dyspnea on exertion and functional decline to his age. He denies any recent orthopnea or paroxysmal nocturnal dyspnea prior to arrival.  He [facility] 07[facility]/2014 with abdominal pain and blood in the stool which started that morning. He was admitted to the internal medicine service and was seen by surgery. CT scan obtained on the same day showed partial small bowel obstruction at the level of mid ileum, specific etiology not demonstrated, gallstones, advanced widespread atherosclerosis, left pleural effusion with potential atelectasis versus pneumonia in the dependent left lower lobe. Chest x-ray was concerning for left upper and the lower lobe infiltrate associated with left pleural effusion. He was treated for possible pneumonia given CT and chest x-ray result. The course of antibiotic was finished after 8 days. Surgery initially attempted to place an NG tube and do conservative management. Unfortunately, conservative management failed as his x-ray showed signs of persistent small bowel obstruction after several days, and patient was brought to the OR on 08/02/2014 for exploratory laparotomy with lysis of adhesions. From small bowel obstruction standpoint, patient has been doing well after surgery. However, he started  having dyspnea on 5/10. He was given one dose of 40 mg IV Lasix followed by 20 mg daily of PO Lasix. Unfortunately, his dyspnea persisted, cardiology has been consulted for acute on chronic diastolic heart failure.   Of note, by the time cardiology has seen the patient, he already have 3+ pitting/nonpitting edema in the lower extremity. He is about 19 L positive since arrival 2 weeks ago. His weight has increased from 174 pounds to 207 pounds.     Result: - LHC  (7/05): Left main stent patent, proximal LAD 80-90%, mid LAD occluded, ostial D1 90%, proximal circumflex occluded, proximal RCA occluded, LIMA-LAD patent, anterior, inferior, lateral HK, EF 35-40% >> redo CABG (free RIMA-OM, SVG-DX, SVG-PDA) - Nuclear (8/10): Inferolateral scar plus ischemia, normal LV function   Inpatient Medications  . amLODipine  5 mg Oral Daily  . aspirin EC  81 mg Oral Daily  . carvedilol  12.5 mg Oral BID WC  . enoxaparin (LOVENOX) injection  40 mg Subcutaneous QHS  . insulin aspart  0-15 Units Subcutaneous TID WC  . insulin aspart  0-5 Units Subcutaneous QHS  . losartan  100 mg Oral Daily  . simvastatin  20 mg Oral QHS    Family History Family History  Problem Relation Age of Onset  . Dementia Mother      Social History History   Social History  . Marital Status: Married    Spouse Name: N/A  . Number of Children: 2  . Years of Education: N/A   Occupational History  . insurance    Social History Main Topics  . Smoking status: Former Smoker    Quit date: 02/22/1986  . Smokeless tobacco: Not on file  . Alcohol Use: No  . Drug Use: No  . Sexual Activity: Not on file   Other Topics Concern  . Not on file   Social History Narrative     Review of Systems  General:  No chills, fever, night sweats or weight changes.  Cardiovascular:  No chest pain, orthopnea, palpitations, paroxysmal nocturnal dyspnea. +dyspnea on exertion, edema Dermatological: No rash, lesions/masses Respiratory: +cough, dyspnea Urologic: No hematuria, dysuria Abdominal:   No nausea, vomiting, diarrhea, melena, or hematemesis. +bright red blood per rectum. Resume PO intake, have bowel movement, still liquidy Neurologic:  No visual changes, wkns, changes in mental status. All other systems reviewed and are otherwise negative except as noted above.  Physical Exam  Blood pressure 143/70, pulse 73, temperature 98.7 F (37.1 C), temperature source Oral, resp. rate 18,  height 5' 9.5" (1.765 m), weight 207 lb 9.6 oz (94.167 kg), SpO2 96 %.  General: Pleasant, NAD Psych: Normal affect. Neuro: Alert and oriented X 3. Moves all extremities spontaneously. HEENT: Normal  Neck: Supple without bruits +JVD. Lungs:  Resp regular and unlabored. Markedly diminished breath sound in bilateral bases without intermittent rale.  Heart: RRR no s3, s4, or murmurs. Abdomen: Soft, non-tender, BS + x 4. Lower abdomen scar noted sealed by staple, no signifcant surrounding erythema or discharge. Mild distended, no signifciant tenderness Extremities: No clubbing, cyanosis. DP/PT/Radials 2+ and equal bilaterally. 2-3+ pitting and nonpitting edema  Labs  No results for input(s): CKTOTAL, CKMB, TROPONINI in the last 72 hours. Lab Results  Component Value Date   WBC 8.9 08/07/2014   HGB 7.8* 08/07/2014   HCT 21.8* 08/07/2014   MCV 86.9 08/07/2014   PLT 228 08/07/2014    Recent Labs Lab 08/07/14 0530 08/08/14 0505  NA 129* 126*  K  3.5 4.2  CL 96* 94*  CO2 26 24  BUN 17 16  CREATININE 0.69 0.78  CALCIUM 8.4* 8.2*  PROT 5.7*  --   BILITOT 0.9  --   ALKPHOS 102  --   ALT 45  --   AST 53*  --   GLUCOSE 163* 173*   Lab Results  Component Value Date   TRIG 44 08/07/2014   No results found for: DDIMER  Radiology/Studies  Dg Chest 2 View  07/26/2014   CLINICAL DATA:  Pleural effusion.  EXAM: CHEST  2 VIEW  COMPARISON:  02/17/2012  FINDINGS: Patient's nasogastric tube in place, tip coiled back upon itself into the lower esophagus. Status post median sternotomy and CABG. Heart is enlarged. There is mild interstitial edema. There patchy infiltrates within the left upper lobe and left lower lobe. Left pleural effusion. Persistent wedge compression of T10, unchanged. Mild dilatation of small bowel loops, unchanged.  IMPRESSION: 1. Cardiomegaly and mild edema. 2. Left upper lobe and left lower lobe infiltrates associated with left pleural effusion. 3. Nasogastric tube coiled  back upon itself into lower esophagus. 4. The salient findings were discussed with Lordis on 07/26/2014 at 8:04 pm.   Electronically Signed   By: Nolon Nations M.D.   On: 07/26/2014 20:04   Ct Abdomen Pelvis W Contrast  07/25/2014   CLINICAL DATA:  Mid to lower abdominal pain since 12 hours ago. Vomiting.  EXAM: CT ABDOMEN AND PELVIS WITH CONTRAST  TECHNIQUE: Multidetector CT imaging of the abdomen and pelvis was performed using the standard protocol following bolus administration of intravenous contrast.  CONTRAST:  176mL OMNIPAQUE IOHEXOL 300 MG/ML  SOLN  COMPARISON:  None.  FINDINGS: There is a left effusion layering dependently with patchy density in the dependent left lung that could be atelectasis or pneumonia.  The liver shows a pattern raising the possibility of early cirrhosis. There are 2 benign calcifications in the left lobe. There are calcified stones in the gallbladder neck. No stone is seen along the course of the common duct. The spleen is normal. There are bilateral low-density adrenal adenomas. The kidneys are normal. The aorta and its branch vessels show extensive atherosclerotic disease. No aneurysm. Bladder, prostate gland and seminal vesicles are unremarkable.  There are dilated fluid and air-filled loops of small intestine consistent with partial small bowel obstruction. There is a caliber change at the mid ileum. Specific obstructing lesion is not identified. There is free intraperitoneal fluid but no free intraperitoneal air are identified. Old vertebral augmentation at T10. Chronic bilateral pars defects at L5.  IMPRESSION: Partial small bowel obstruction. Level of the obstruction is probably mid ileum. Specific etiology not demonstrated.  Free fluid which could relate to the small bowel obstruction or could be secondary to cirrhosis. The patient does appear to have cirrhosis of the liver.  Gallstones.  Advanced widespread atherosclerosis.  Left pleural effusion. Atelectasis and/or  pneumonia in the dependent left lower lobe.   Electronically Signed   By: Nelson Chimes M.D.   On: 07/25/2014 21:28   Dg Abd 2 Views  08/02/2014   CLINICAL DATA:  74 year old male with a history of small bowel obstruction  EXAM: ABDOMEN - 2 VIEW  COMPARISON:  08/01/2014, 07/30/2014, 07/29/2014, CT 07/25/2014  FINDINGS: Multiple borderline dilated small bowel loops within the abdomen on the supine image. Upright image demonstrates air-fluid levels.  Paucity of distal colonic gas.  Surgical changes of prior median sternotomy, as well as surgical clips of the left upper  quadrant.  Stones within the gallbladder again visualized in the right upper quadrant.  No displaced fracture.  Vascular calcifications.  IMPRESSION: Evidence of persisting small bowel obstruction, with multiple borderline dilated small bowel loops and paucity of colonic gas.  Signed,  Dulcy Fanny. Earleen Newport, DO  [facility]  [facility]   Electronically Signed   By: Corrie Mckusick D.O.   On: 08/02/2014 07:31   Dg Abd 2 Views  08/01/2014   CLINICAL DATA:  Follow-up of small bowel obstruction.  EXAM: ABDOMEN - 2 VIEW  COMPARISON:  Supine and upright abdominal films of Jul 30, 2014  FINDINGS: There has been interval removal of the esophagogastric tube. There remain loops of mildly distended gas and fluid-filled small bowel in the midline. The volume of gas present has decreased somewhat. There is some gas and fluid within the colon. Previously demonstrated colonic contrast has cleared. A small amount of air is present within the stomach. No definite extraluminal gas collections are demonstrated.  IMPRESSION: Persistent mid to distal relatively high-grade small bowel obstruction. Interval removal of the esophagogastric tube.  If the patient's clinical status has deteriorated, abdominal and pelvic CT scanning now would be useful.   Electronically Signed   By: Semaje  Martinique M.D.   On: 08/01/2014 10:38   Dg Abd 2  Views  07/30/2014   CLINICAL DATA:  Small-bowel obstruction.  EXAM: ABDOMEN - 2 VIEW  COMPARISON:  07/29/2014 .  FINDINGS: NG tube in stable position. Soft tissue structures are unremarkable. Persistent dilated loops of small bowel are again noted consistent with small bowel obstruction. Oral contrast again noted colon. No free air. Prior median sternotomy. Surgical clips left upper quadrant.  IMPRESSION: Persistent unchanged dilated loops of small bowel consistent with small bowel obstruction. Oral contrast is noted in the colon. No free air. NG tube in stable position.   Electronically Signed   By: Marcello Moores  Register   On: 07/30/2014 07:11   Dg Abd 2 Views  07/28/2014   CLINICAL DATA:  Ileus, nausea.  EXAM: ABDOMEN - 2 VIEW  COMPARISON:  07/27/2014  FINDINGS: NG tube is present in the stomach. There are dilated small bowel loops with air-fluid levels. Degree of small bowel dilatation has increased since prior study. Colon is decompressed. No free air organomegaly. No suspicious calcification. Vascular calcifications noted in the aorta and iliac vessels. No acute bony abnormality.  IMPRESSION: Worsening small bowel dilatation compatible with worsening small bowel obstruction.   Electronically Signed   By: Rolm Baptise M.D.   On: 07/28/2014 08:57   Dg Abd Portable 1v  07/29/2014   CLINICAL DATA:  24 hour delay with Gastrografin. Small bowel protocol.  EXAM: PORTABLE ABDOMEN - 1 VIEW  COMPARISON:  07/28/2014  FINDINGS: Persistent gaseous distended loops of small bowel are demonstrated within the central abdomen measuring up to 3.5 cm, compatible with small bowel obstruction. Oral contrast material is demonstrated to the right lower quadrant, within the cecum and proximal ascending colon. NG tube projects over the left upper quadrant. Left upper quadrant surgical clips. Lower lumbar spine degenerative changes.  IMPRESSION: Oral contrast material demonstrated within the right lower quadrant, likely within the cecum  and ascending colon.  Persistent gaseous distended loops of small bowel, compatible with obstruction.   Electronically Signed   By: Lovey Newcomer M.D.   On: 07/29/2014 13:40   Dg Abd Portable 1v-small Bowel Obstruction Protocol-initial, 8 Hr Delay  07/29/2014   CLINICAL DATA:  Small bowel obstruction  EXAM: PORTABLE ABDOMEN - 1 VIEW  COMPARISON:  07/28/2014  FINDINGS: The nasogastric tube extends into the proximal stomach with the side-port in the region of the EG junction.  There is persistent dilatation and stacking of small bowel loops consistent with small bowel obstruction. No free air is evident. No interval change is evident.  IMPRESSION: Persistent small bowel obstruction.   Electronically Signed   By: Andreas Newport M.D.   On: 07/29/2014 02:27   Dg Abd Portable 1v  07/27/2014   CLINICAL DATA:  Small-bowel obstruction.  EXAM: PORTABLE ABDOMEN - 1 VIEW  COMPARISON:  07/25/2014  FINDINGS: Nasogastric tube is partially imaged, tip overlying the level of the stomach. There are persistent dilated small bowel loops in the central abdomen. Nondilated loops of large bowel persist. There is a small amount of residual contrast in the bladder.  IMPRESSION: 1. Persistent small bowel dilatation. 2. Residual contrast in the bladder.   Electronically Signed   By: Nolon Nations M.D.   On: 07/27/2014 09:10   Dg Abd Portable 1v  07/25/2014   CLINICAL DATA:  Abdominal pain.  Nausea and vomiting for 1 day.  EXAM: PORTABLE ABDOMEN - 1 VIEW  COMPARISON:  CT of earlier in the day  FINDINGS: Single supine portable view. Contrast within the urinary bladder. Gastric distension with possible nasogastric tube, incompletely imaged. Proximal small bowel loops measure up to 3.2 cm. Gas and stool within the colon. Advanced atherosclerosis.  IMPRESSION: Partial small bowel obstruction, without free intraperitoneal air or other acute complication.   Electronically Signed   By: Abigail Miyamoto M.D.   On: 07/25/2014 23:46     ECG  EKG 4/29: NSR without significant ST-T wave changes  ASSESSMENT AND PLAN  1. Acute on chronic (presumably) diastolic HF  - Will need reassess echocardiogram, ordered  - currently 19 L positive since arrived 2 wks ago, weight increased from 174 to 207  - IM given 40mg  IV lasix this morning, will start on 40mg  BID IV lasix for aggressive diuresis  - likely result of IVF given perioperatively, he also received large volume of fluid via TPN, has significant anemia and has significant hypoalbuminemia which resulting in further 3rd spacing of intravascular volume. He has resume PO intake since then and having bowel movement.   2. Anemia: stable, however down from baseline of 11 on arrival.  3. Hypoalbuminemia: expect improve with resume PO intake  4. CAD s/p redo CABG in 2005 with free RIMA to OM, SVG to diagonal, SVG to PDA  - no obvious anginal symptom  5. DM  6. hypertension  7. hyperlipidemia  Signed, Almyra Deforest, PA-C 08/08/2014, 12:24 PM Patient seen and examined and history reviewed. Agree with above findings and plan. 74 yo WM well known to me. Admitted with bowel obstruction and had surgery for lysis of adhesions. With fluid resuscitation he is now massively volume overloaded. I/O positive 19 liters and weight up 28 lbs. Positive JVD. Breath sounds diminished in bases. 3+ edema. He needs IV diuresis. Continue ARB and Coreg. Will check Echo. Follow renal function and electrolytes closely. With his prior cardiac history he should not be DC until volume status is close to baseline. We will follow with you.  Reylene Stauder Martinique, [place] 08/08/2014 4:52 PM

## 2014-08-08 NOTE — Progress Notes (Signed)
6 Days Post-Op  [facility]  Subjective: Two bowel movements overnight and wife reports third large bowel movement this morning. Wife and patient reports that patient is "ready for some real food." Tolerating fulls without issue. Reports some painless bilateral pedal edema that has developed overnight. No associated increased work of breathing.   Objective: Vital signs in last 24 hours: Temp:  [98.2 F (36.8 C)-99.2 F (37.3 C)] 98.7 F (37.1 C) (05/13 0519) Pulse Rate:  [68-84] 73 (05/13 0519) Resp:  [16-18] 18 (05/13 0519) BP: (135-143)/(65-78) 143/70 mmHg (05/13 0519) SpO2:  [94 %-98 %] 96 % (05/13 0519) Weight:  [94.167 kg (207 lb 9.6 oz)] 94.167 kg (207 lb 9.6 oz) (05/13 0519) Last BM Date: 08/07/14  Intake/Output from previous day: 05/12 0701 - 05/13 0700 In: 1514 [P.O.:600; IV Piggyback:228; TPN:686] Out: 2 [Stool:2] Intake/Output this shift:    PE: Pulm: CTA B/L Abd: soft, NT with minimal distension, NABS. Midline incision clean, dry and intact with staples.  Ext: 2+ non-pitting, non-tender bilateral lower leg edema  Lab Results:   Recent Labs  08/06/14 0405 08/07/14 0530  WBC 9.2 8.9  HGB 7.8* 7.8*  HCT 22.0* 21.8*  PLT 210 228   BMET  Recent Labs  08/07/14 0530 08/08/14 0505  NA 129* 126*  K 3.5 4.2  CL 96* 94*  CO2 26 24  GLUCOSE 163* 173*  BUN 17 16  CREATININE 0.69 0.78  CALCIUM 8.4* 8.2*   PT/INR No results for input(s): LABPROT, INR in the last 72 hours. CMP     Component Value Date/Time   NA 126* 08/08/2014 0505   K 4.2 08/08/2014 0505   CL 94* 08/08/2014 0505   CO2 24 08/08/2014 0505   GLUCOSE 173* 08/08/2014 0505   BUN 16 08/08/2014 0505   CREATININE 0.78 08/08/2014 0505   CALCIUM 8.2* 08/08/2014 0505   PROT 5.7* 08/07/2014 0530   ALBUMIN 2.6* 08/07/2014 0530   AST 53* 08/07/2014 0530   ALT 45 08/07/2014 0530   ALKPHOS 102 08/07/2014 0530   BILITOT 0.9 08/07/2014 0530   GFRNONAA >60 08/08/2014 0505   GFRAA >60  08/08/2014 0505   Lipase     Component Value Date/Time   LIPASE 31 07/25/2014 1828       Studies/Results: No results found.  Anti-infectives: Anti-infectives    Start     Dose/Rate Route Frequency Ordered Stop   08/03/14 2200  azithromycin (ZITHROMAX) 500 mg in dextrose 5 % 250 mL IVPB     500 mg 250 mL/hr over 60 Minutes Intravenous Every 24 hours 08/03/14 0928 08/03/14 2339   08/03/14 2200  cefTRIAXone (ROCEPHIN) 1 g in dextrose 5 % 50 mL IVPB     1 g 100 mL/hr over 30 Minutes Intravenous Every 24 hours 08/03/14 0928 08/03/14 2308   08/02/14 1550  ceFAZolin (ANCEF) 2-3 GM-% IVPB SOLR    Comments:  Marinda Elk   : cabinet override      08/02/14 1550 08/02/14 1615   07/30/14 0800  ceFAZolin (ANCEF) IVPB 2 g/50 mL premix     2 g 100 mL/hr over 30 Minutes Intravenous To Surgery 07/30/14 0746 07/31/14 0800   07/25/14 2245  cefTRIAXone (ROCEPHIN) 1 g in dextrose 5 % 50 mL IVPB  Status:  Discontinued     1 g 100 mL/hr over 30 Minutes Intravenous Every 24 hours 07/25/14 2234 08/03/14 0928   07/25/14 2245  azithromycin (ZITHROMAX) 500 mg in dextrose 5 % 250 mL IVPB  Status:  Discontinued     500 mg 250 mL/hr over 60 Minutes Intravenous Every 24 hours 07/25/14 2234 08/03/14 7001       Assessment/Plan  POD#6 s/p ex lap with LOA for single band adhesion  (Dr. Georgette Dover 08-02-14) Seems reasonable to D/C TPN and advance patient to regular diet today. -continue to mobilize and perform pulm toilet DVT prophylaxis: lovenox/SCDs -anemia appears stable -afebrile -reasonable to begin discharge planning  LOS: 14 days    Lahoma Rocker, [facility]  08/08/2014, 8:51 AM

## 2014-08-09 ENCOUNTER — Inpatient Hospital Stay (HOSPITAL_COMMUNITY): Payer: Medicare Other

## 2014-08-09 ENCOUNTER — Encounter (HOSPITAL_COMMUNITY): Payer: Self-pay | Admitting: Cardiology

## 2014-08-09 DIAGNOSIS — I5033 Acute on chronic diastolic (congestive) heart failure: Secondary | ICD-10-CM

## 2014-08-09 DIAGNOSIS — I5042 Chronic combined systolic (congestive) and diastolic (congestive) heart failure: Secondary | ICD-10-CM

## 2014-08-09 DIAGNOSIS — R0600 Dyspnea, unspecified: Secondary | ICD-10-CM

## 2014-08-09 HISTORY — DX: Acute on chronic diastolic (congestive) heart failure: I50.33

## 2014-08-09 LAB — BASIC METABOLIC PANEL
Anion gap: 9 (ref 5–15)
BUN: 13 mg/dL (ref 6–20)
CHLORIDE: 90 mmol/L — AB (ref 101–111)
CO2: 28 mmol/L (ref 22–32)
Calcium: 8.3 mg/dL — ABNORMAL LOW (ref 8.9–10.3)
Creatinine, Ser: 0.79 mg/dL (ref 0.61–1.24)
GFR calc Af Amer: >60 mL/min (ref >60)
GFR calc non Af Amer: >60 mL/min (ref >60)
GLUCOSE: 184 mg/dL — AB (ref 65–99)
POTASSIUM: 3.5 mmol/L (ref 3.5–5.1)
Sodium: 127 mmol/L — ABNORMAL LOW (ref 135–145)

## 2014-08-09 LAB — GLUCOSE, CAPILLARY
GLUCOSE-CAPILLARY: 188 mg/dL — AB (ref 65–99)
GLUCOSE-CAPILLARY: 97 mg/dL (ref 65–99)
Glucose-Capillary: 173 mg/dL — ABNORMAL HIGH (ref 65–99)
Glucose-Capillary: 235 mg/dL — ABNORMAL HIGH (ref 65–99)

## 2014-08-09 LAB — CBC
HCT: 21.3 % — ABNORMAL LOW (ref 39.0–52.0)
Hemoglobin: 7.7 g/dL — ABNORMAL LOW (ref 13.0–17.0)
MCH: 31 pg (ref 26.0–34.0)
MCHC: 36.2 g/dL — AB (ref 30.0–36.0)
MCV: 85.9 fL (ref 78.0–100.0)
PLATELETS: 225 10*3/uL (ref 150–400)
RBC: 2.48 MIL/uL — ABNORMAL LOW (ref 4.22–5.81)
RDW: 14.3 % (ref 11.5–15.5)
WBC: 7.3 10*3/uL (ref 4.0–10.5)

## 2014-08-09 LAB — PREPARE RBC (CROSSMATCH)

## 2014-08-09 MED ORDER — FUROSEMIDE 10 MG/ML IJ SOLN
20.0000 mg | Freq: Once | INTRAMUSCULAR | Status: AC
  Administered 2014-08-09: 20 mg via INTRAVENOUS
  Filled 2014-08-09: qty 2

## 2014-08-09 MED ORDER — POTASSIUM CHLORIDE CRYS ER 20 MEQ PO TBCR
40.0000 meq | EXTENDED_RELEASE_TABLET | ORAL | Status: AC
  Administered 2014-08-09 (×2): 40 meq via ORAL
  Filled 2014-08-09 (×2): qty 2

## 2014-08-09 MED ORDER — SODIUM CHLORIDE 0.9 % IV SOLN
Freq: Once | INTRAVENOUS | Status: AC
  Administered 2014-08-09: 14:00:00 via INTRAVENOUS

## 2014-08-09 MED ORDER — SODIUM CHLORIDE 0.9 % IJ SOLN
10.0000 mL | INTRAMUSCULAR | Status: DC | PRN
  Administered 2014-08-09: 20 mL
  Administered 2014-08-10: 10 mL
  Filled 2014-08-09 (×2): qty 40

## 2014-08-09 NOTE — Progress Notes (Signed)
Called person on call for 2 D echo to make sure it will be done today.  She confirmed that it would be done today.

## 2014-08-09 NOTE — Progress Notes (Signed)
Echocardiogram 2D Echocardiogram has been performed.  [name] 08/09/2014, 1:37 PM

## 2014-08-09 NOTE — Progress Notes (Signed)
7 Days Post-Op  Subjective: No nausea or emesis On regular diet.  Taking some po +BM  Objective: Vital signs in last 24 hours: Temp:  [97.8 F (36.6 C)-99 F (37.2 C)] 99 F (37.2 C) (05/14 0600) Pulse Rate:  [69-73] 73 (05/14 0600) Resp:  [18-30] 30 (05/14 0600) BP: (141-150)/(59-68) 143/59 mmHg (05/14 0600) SpO2:  [93 %-98 %] 93 % (05/14 0600) Weight:  [87.454 kg (192 lb 12.8 oz)] 87.454 kg (192 lb 12.8 oz) (05/14 0600) Last BM Date: 08/08/14  Intake/Output from previous day: 05/13 0701 - 05/14 0700 In: 260 [P.O.:240; I.V.:20] Out: 3175 [Urine:3175] Intake/Output this shift: Total I/O In: 400 [P.O.:400] Out: 1150 [Urine:1150]  Abdomen a little full, incision clean, minimally tender  Lab Results:   Recent Labs  08/07/14 0530 08/09/14 0510  WBC 8.9 7.3  HGB 7.8* 7.7*  HCT 21.8* 21.3*  PLT 228 225   BMET  Recent Labs  08/08/14 0505 08/09/14 0510  NA 126* 127*  K 4.2 3.5  CL 94* 90*  CO2 24 28  GLUCOSE 173* 184*  BUN 16 13  CREATININE 0.78 0.79  CALCIUM 8.2* 8.3*   PT/INR No results for input(s): LABPROT, INR in the last 72 hours. ABG No results for input(s): PHART, HCO3 in the last 72 hours.  Invalid input(s): PCO2, PO2  Studies/Results: No results found.  Anti-infectives: Anti-infectives    Start     Dose/Rate Route Frequency Ordered Stop   08/03/14 2200  azithromycin (ZITHROMAX) 500 mg in dextrose 5 % 250 mL IVPB     500 mg 250 mL/hr over 60 Minutes Intravenous Every 24 hours 08/03/14 0928 08/03/14 2339   08/03/14 2200  cefTRIAXone (ROCEPHIN) 1 g in dextrose 5 % 50 mL IVPB     1 g 100 mL/hr over 30 Minutes Intravenous Every 24 hours 08/03/14 0928 08/03/14 2308   08/02/14 1550  ceFAZolin (ANCEF) 2-3 GM-% IVPB SOLR    Comments:  Marinda Elk   : cabinet override      08/02/14 1550 08/02/14 1615   07/30/14 0800  ceFAZolin (ANCEF) IVPB 2 g/50 mL premix     2 g 100 mL/hr over 30 Minutes Intravenous To Surgery 07/30/14 0746 07/31/14 0800   07/25/14 2245  cefTRIAXone (ROCEPHIN) 1 g in dextrose 5 % 50 mL IVPB  Status:  Discontinued     1 g 100 mL/hr over 30 Minutes Intravenous Every 24 hours 07/25/14 2234 08/03/14 0928   07/25/14 2245  azithromycin (ZITHROMAX) 500 mg in dextrose 5 % 250 mL IVPB  Status:  Discontinued     500 mg 250 mL/hr over 60 Minutes Intravenous Every 24 hours 07/25/14 2234 08/03/14 0928      Assessment/Plan: s/p Procedure(s): EXPLORATORY LAPAROTOMY LYSIS OF ADHESIONS (N/A)  Stable post op Encourage PO and ambulation Transfuse per Int Med  LOS: 15 days    Marcelline Temkin A 08/09/2014

## 2014-08-09 NOTE — Progress Notes (Signed)
[facility]                                                                              Patient Demographics  [name], is a 74 y.o. male, DOB - 05-21-1940, ZLD:[id number]  Admit date - 07/25/2014   Admitting Physician Jani Gravel, MD  Outpatient Primary MD for the patient is  Melinda Crutch, MD  LOS - 15   Chief Complaint  Patient presents with  . Abdominal Pain       Brief HPI   Patient is a 74 year old male with CAD, diabetes type 2, hypertension, hyperlipidemia presented with abdominal pain and described as periumbilical, sharp with nausea and vomiting. He denied any fevers or chills, diarrhea or melena or hematochezia. Patient underwent CT of the abdomen and pelvis which showed partial small bowel obstruction. Patient was admitted for further workup.  Assessment & Plan       SBO (small bowel obstruction) - Status post exploratory laparoscopy with lysis of adhesions 08/02/14 - management per surgery recommendations - Initially on TPN, stopped - Advanced to soft diet, which she is tolerating very well.  Dyspnea secondary to volume overload - 2-D echo pending, presumed acute on chronic diastolic CHF - Patient with increased from 174 on admission to peak of 207, at 1.19 L positive. - Continue with IV diuresis, strict ins and outs, daily weight, patient is -3 L over the last 24 hours    CAD (coronary artery disease): - Currently stable, no chest pain     Diabetes mellitus - Hemoglobin A1c 6.4, good outpatient glycemic control, - CBGs are more acceptable after TPN was stopped, continue with insulin sliding scale    HTN (hypertension) - Well-controlled, continue Norvasc, Coreg  Questionable left pleural effusion on CT abdomen, CAP on CXR.  - No coughing or respiratory symptoms, afebrile. Chest x-ray showed patchy infiltrates in the upper lobe and left lower lobe - Does not need any further antibiotics, on day #8, completed antibiotics for  Zithromax/Rocephin -  repeat chest x-ray in 4-6 weeks to ensure complete resolution of pneumonia.  Hyponatremia - Most likely in the setting of volume overload, monitor on diuresis.  Normocytic anemia - Patient continues to have slow gradual drop in his hemoglobin, transfuse 1 unit packed red blood cells on 5/14 given his symptomatic anemia, port significant fatigue, and shortness of breath. Discussed with the patient, agreeable to transfusion  Hypertension - Continue with Coreg, losartan, amlodipine  Hyperlipidemia - Continue with statin  Code Status: Full code  Family Communication: None bedside  Disposition Plan: not medically ready  Time Spent in minutes  30 minutes  Procedures  abd Xray CT abd  Consults   Surgery Cardiology   DVT Prophylaxis  Lovenox and SCD's  Medications  Scheduled Meds: . amLODipine  5 mg Oral Daily  . aspirin EC  81 mg Oral Daily  . carvedilol  12.5 mg Oral BID WC  . enoxaparin (LOVENOX) injection  40 mg Subcutaneous QHS  . furosemide  20 mg Intravenous Once  . furosemide  40 mg Intravenous BID  . insulin aspart  0-15 Units Subcutaneous TID WC  . insulin  aspart  0-5 Units Subcutaneous QHS  . losartan  100 mg Oral Daily  . simvastatin  20 mg Oral QHS   Continuous Infusions:   PRN Meds:.acetaminophen **OR** acetaminophen, ALPRAZolam, HYDROmorphone (DILAUDID) injection, ipratropium-albuterol, nitroGLYCERIN, ondansetron (ZOFRAN) IV, oxyCODONE-acetaminophen, sodium chloride, sodium chloride   Antibiotics   Anti-infectives    Start     Dose/Rate Route Frequency Ordered Stop   08/03/14 2200  azithromycin (ZITHROMAX) 500 mg in dextrose 5 % 250 mL IVPB     500 mg 250 mL/hr over 60 Minutes Intravenous Every 24 hours 08/03/14 0928 08/03/14 2339   08/03/14 2200  cefTRIAXone (ROCEPHIN) 1 g in dextrose 5 % 50 mL IVPB     1 g 100 mL/hr over 30 Minutes Intravenous Every 24 hours 08/03/14 0928 08/03/14 2308   08/02/14 1550  ceFAZolin (ANCEF) 2-3  GM-% IVPB SOLR    Comments:  Tommi Rumps, Scot   : cabinet override      08/02/14 1550 08/02/14 1615   07/30/14 0800  ceFAZolin (ANCEF) IVPB 2 g/50 mL premix     2 g 100 mL/hr over 30 Minutes Intravenous To Surgery 07/30/14 0746 07/31/14 0800   07/25/14 2245  cefTRIAXone (ROCEPHIN) 1 g in dextrose 5 % 50 mL IVPB  Status:  Discontinued     1 g 100 mL/hr over 30 Minutes Intravenous Every 24 hours 07/25/14 2234 08/03/14 0928   07/25/14 2245  azithromycin (ZITHROMAX) 500 mg in dextrose 5 % 250 mL IVPB  Status:  Discontinued     500 mg 250 mL/hr over 60 Minutes Intravenous Every 24 hours 07/25/14 2234 08/03/14 0938        Subjective:   [name] was seen and examined today. Patient reports good bowel movements yesterday, still reports generalized weakness and fatigue, a febrile, denies any new weakness or numbness or tingling.   Objective:   Blood pressure 140/60, pulse 67, temperature 98.6 F (37 C), temperature source Oral, resp. rate 14, height 5' 9.5" (1.765 m), weight 87.454 kg (192 lb 12.8 oz), SpO2 96 %.  Wt Readings from Last 3 Encounters:  08/09/14 87.454 kg (192 lb 12.8 oz)  01/24/14 81.375 kg (179 lb 6.4 oz)  05/27/13 81.194 kg (179 lb)     Intake/Output Summary (Last 24 hours) at 08/09/14 1438 Last data filed at 08/09/14 1102  Gross per 24 hour  Intake    970 ml  Output   4725 ml  Net  -3755 ml    Exam  General: Alert and oriented x 3, NAD  HEENT:  PERRLA, EOMI  Neck: Supple, no JVD  CVS: S1 S2 clear, no mrg  Respiratory;  decreased breath sounds at the bases  Abdomen : Soft,  midline incision clean, dry, staples intact, nontender ,+ BS  Ext:  no cyanosis or clubbing, 2+ pitting edema bilaterally lower extremities   Neuro: no new focal neurological deficits  Skin: No rashes  Psych: Alert and oriented 3   Data Review   Micro Results Recent Results (from the past 240 hour(s))  MRSA PCR Screening     Status: None   Collection Time: 08/02/14   2:32 PM  Result Value Ref Range Status   MRSA by PCR NEGATIVE NEGATIVE Final    Comment:        The GeneXpert MRSA Assay (FDA approved for NASAL specimens only), is one component of a comprehensive MRSA colonization surveillance program. It is not intended to diagnose MRSA infection nor to guide or monitor treatment for MRSA infections.  Radiology Reports Dg Chest 2 View  07/26/2014   CLINICAL DATA:  Pleural effusion.  EXAM: CHEST  2 VIEW  COMPARISON:  02/17/2012  FINDINGS: Patient's nasogastric tube in place, tip coiled back upon itself into the lower esophagus. Status post median sternotomy and CABG. Heart is enlarged. There is mild interstitial edema. There patchy infiltrates within the left upper lobe and left lower lobe. Left pleural effusion. Persistent wedge compression of T10, unchanged. Mild dilatation of small bowel loops, unchanged.  IMPRESSION: 1. Cardiomegaly and mild edema. 2. Left upper lobe and left lower lobe infiltrates associated with left pleural effusion. 3. Nasogastric tube coiled back upon itself into lower esophagus. 4. The salient findings were discussed with Lordis on 07/26/2014 at 8:04 pm.   Electronically Signed   By: Nolon Nations M.D.   On: 07/26/2014 20:04   Ct Abdomen Pelvis W Contrast  07/25/2014   CLINICAL DATA:  Mid to lower abdominal pain since 12 hours ago. Vomiting.  EXAM: CT ABDOMEN AND PELVIS WITH CONTRAST  TECHNIQUE: Multidetector CT imaging of the abdomen and pelvis was performed using the standard protocol following bolus administration of intravenous contrast.  CONTRAST:  175mL OMNIPAQUE IOHEXOL 300 MG/ML  SOLN  COMPARISON:  None.  FINDINGS: There is a left effusion layering dependently with patchy density in the dependent left lung that could be atelectasis or pneumonia.  The liver shows a pattern raising the possibility of early cirrhosis. There are 2 benign calcifications in the left lobe. There are calcified stones in the gallbladder neck. No  stone is seen along the course of the common duct. The spleen is normal. There are bilateral low-density adrenal adenomas. The kidneys are normal. The aorta and its branch vessels show extensive atherosclerotic disease. No aneurysm. Bladder, prostate gland and seminal vesicles are unremarkable.  There are dilated fluid and air-filled loops of small intestine consistent with partial small bowel obstruction. There is a caliber change at the mid ileum. Specific obstructing lesion is not identified. There is free intraperitoneal fluid but no free intraperitoneal air are identified. Old vertebral augmentation at T10. Chronic bilateral pars defects at L5.  IMPRESSION: Partial small bowel obstruction. Level of the obstruction is probably mid ileum. Specific etiology not demonstrated.  Free fluid which could relate to the small bowel obstruction or could be secondary to cirrhosis. The patient does appear to have cirrhosis of the liver.  Gallstones.  Advanced widespread atherosclerosis.  Left pleural effusion. Atelectasis and/or pneumonia in the dependent left lower lobe.   Electronically Signed   By: Nelson Chimes M.D.   On: 07/25/2014 21:28   Dg Abd 2 Views  08/02/2014   CLINICAL DATA:  74 year old male with a history of small bowel obstruction  EXAM: ABDOMEN - 2 VIEW  COMPARISON:  08/01/2014, 07/30/2014, 07/29/2014, CT 07/25/2014  FINDINGS: Multiple borderline dilated small bowel loops within the abdomen on the supine image. Upright image demonstrates air-fluid levels.  Paucity of distal colonic gas.  Surgical changes of prior median sternotomy, as well as surgical clips of the left upper quadrant.  Stones within the gallbladder again visualized in the right upper quadrant.  No displaced fracture.  Vascular calcifications.  IMPRESSION: Evidence of persisting small bowel obstruction, with multiple borderline dilated small bowel loops and paucity of colonic gas.  Signed,  Dulcy Fanny. Earleen Newport, DO  [facility]  [facility]   Electronically Signed   By: Corrie Mckusick D.O.   On: 08/02/2014 07:31   Dg Abd 2  Views  08/01/2014   CLINICAL DATA:  Follow-up of small bowel obstruction.  EXAM: ABDOMEN - 2 VIEW  COMPARISON:  Supine and upright abdominal films of Jul 30, 2014  FINDINGS: There has been interval removal of the esophagogastric tube. There remain loops of mildly distended gas and fluid-filled small bowel in the midline. The volume of gas present has decreased somewhat. There is some gas and fluid within the colon. Previously demonstrated colonic contrast has cleared. A small amount of air is present within the stomach. No definite extraluminal gas collections are demonstrated.  IMPRESSION: Persistent mid to distal relatively high-grade small bowel obstruction. Interval removal of the esophagogastric tube.  If the patient's clinical status has deteriorated, abdominal and pelvic CT scanning now would be useful.   Electronically Signed   By: Raywood  Martinique M.D.   On: 08/01/2014 10:38   Dg Abd 2 Views  07/30/2014   CLINICAL DATA:  Small-bowel obstruction.  EXAM: ABDOMEN - 2 VIEW  COMPARISON:  07/29/2014 .  FINDINGS: NG tube in stable position. Soft tissue structures are unremarkable. Persistent dilated loops of small bowel are again noted consistent with small bowel obstruction. Oral contrast again noted colon. No free air. Prior median sternotomy. Surgical clips left upper quadrant.  IMPRESSION: Persistent unchanged dilated loops of small bowel consistent with small bowel obstruction. Oral contrast is noted in the colon. No free air. NG tube in stable position.   Electronically Signed   By: Marcello Moores  Register   On: 07/30/2014 07:11   Dg Abd 2 Views  07/28/2014   CLINICAL DATA:  Ileus, nausea.  EXAM: ABDOMEN - 2 VIEW  COMPARISON:  07/27/2014  FINDINGS: NG tube is present in the stomach. There are dilated small bowel loops with air-fluid levels. Degree of small bowel dilatation has increased  since prior study. Colon is decompressed. No free air organomegaly. No suspicious calcification. Vascular calcifications noted in the aorta and iliac vessels. No acute bony abnormality.  IMPRESSION: Worsening small bowel dilatation compatible with worsening small bowel obstruction.   Electronically Signed   By: Rolm Baptise M.D.   On: 07/28/2014 08:57   Dg Abd Portable 1v  07/29/2014   CLINICAL DATA:  24 hour delay with Gastrografin. Small bowel protocol.  EXAM: PORTABLE ABDOMEN - 1 VIEW  COMPARISON:  07/28/2014  FINDINGS: Persistent gaseous distended loops of small bowel are demonstrated within the central abdomen measuring up to 3.5 cm, compatible with small bowel obstruction. Oral contrast material is demonstrated to the right lower quadrant, within the cecum and proximal ascending colon. NG tube projects over the left upper quadrant. Left upper quadrant surgical clips. Lower lumbar spine degenerative changes.  IMPRESSION: Oral contrast material demonstrated within the right lower quadrant, likely within the cecum and ascending colon.  Persistent gaseous distended loops of small bowel, compatible with obstruction.   Electronically Signed   By: Lovey Newcomer M.D.   On: 07/29/2014 13:40   Dg Abd Portable 1v-small Bowel Obstruction Protocol-initial, 8 Hr Delay  07/29/2014   CLINICAL DATA:  Small bowel obstruction  EXAM: PORTABLE ABDOMEN - 1 VIEW  COMPARISON:  07/28/2014  FINDINGS: The nasogastric tube extends into the proximal stomach with the side-port in the region of the EG junction.  There is persistent dilatation and stacking of small bowel loops consistent with small bowel obstruction. No free air is evident. No interval change is evident.  IMPRESSION: Persistent small bowel obstruction.   Electronically Signed   By: Andreas Newport M.D.   On: 07/29/2014  02:27   Dg Abd Portable 1v  07/27/2014   CLINICAL DATA:  Small-bowel obstruction.  EXAM: PORTABLE ABDOMEN - 1 VIEW  COMPARISON:  07/25/2014  FINDINGS:  Nasogastric tube is partially imaged, tip overlying the level of the stomach. There are persistent dilated small bowel loops in the central abdomen. Nondilated loops of large bowel persist. There is a small amount of residual contrast in the bladder.  IMPRESSION: 1. Persistent small bowel dilatation. 2. Residual contrast in the bladder.   Electronically Signed   By: Nolon Nations M.D.   On: 07/27/2014 09:10   Dg Abd Portable 1v  07/25/2014   CLINICAL DATA:  Abdominal pain.  Nausea and vomiting for 1 day.  EXAM: PORTABLE ABDOMEN - 1 VIEW  COMPARISON:  CT of earlier in the day  FINDINGS: Single supine portable view. Contrast within the urinary bladder. Gastric distension with possible nasogastric tube, incompletely imaged. Proximal small bowel loops measure up to 3.2 cm. Gas and stool within the colon. Advanced atherosclerosis.  IMPRESSION: Partial small bowel obstruction, without free intraperitoneal air or other acute complication.   Electronically Signed   By: Abigail Miyamoto M.D.   On: 07/25/2014 23:46    CBC  Recent Labs Lab 08/05/14 0550 08/06/14 0405 08/07/14 0530 08/09/14 0510  WBC 9.4 9.2 8.9 7.3  HGB 8.2* 7.8* 7.8* 7.7*  HCT 23.2* 22.0* 21.8* 21.3*  PLT 186 210 228 225  MCV 88.2 86.3 86.9 85.9  MCH 31.2 30.6 31.1 31.0  MCHC 35.3 35.5 35.8 36.2*  RDW 14.5 14.2 14.4 14.3    Chemistries   Recent Labs Lab 08/05/14 0550 08/06/14 0405 08/07/14 0530 08/08/14 0505 08/09/14 0510  NA 134* 133* 129* 126* 127*  K 3.5 3.0* 3.5 4.2 3.5  CL 100* 98* 96* 94* 90*  CO2 22 28 26 24 28   GLUCOSE 226* 168* 163* 173* 184*  BUN 13 14 17 16 13   CREATININE 0.77 0.64 0.69 0.78 0.79  CALCIUM 8.6* 8.6* 8.4* 8.2* 8.3*  MG 1.8 2.0 1.8 1.9  --   AST 25  --  53*  --   --   ALT 23  --  45  --   --   ALKPHOS 72  --  102  --   --   BILITOT 1.2  --  0.9  --   --    ------------------------------------------------------------------------------------------------------------------ estimated  creatinine clearance is 90.8 mL/min (by C-G formula based on Cr of 0.79). ------------------------------------------------------------------------------------------------------------------ No results for input(s): HGBA1C in the last 72 hours. ------------------------------------------------------------------------------------------------------------------  Recent Labs  08/07/14 0530  TRIG 44   ------------------------------------------------------------------------------------------------------------------ No results for input(s): TSH, T4TOTAL, T3FREE, THYROIDAB in the last 72 hours.  Invalid input(s): FREET3 ------------------------------------------------------------------------------------------------------------------ No results for input(s): VITAMINB12, FOLATE, FERRITIN, TIBC, IRON, RETICCTPCT in the last 72 hours.  Coagulation profile No results for input(s): INR, PROTIME in the last 168 hours.  No results for input(s): DDIMER in the last 72 hours.  Cardiac Enzymes No results for input(s): CKMB, TROPONINI, MYOGLOBIN in the last 168 hours.  Invalid input(s): CK ------------------------------------------------------------------------------------------------------------------ Invalid input(s): [place]   Recent Labs  08/08/14 0733 08/08/14 1201 08/08/14 1705 08/08/14 2118 08/09/14 0808 08/09/14 1135  GLUCAP 167* 200* 198* 162* 173* 188*     Airika Alkhatib M.D. [facility] 08/09/2014, 2:38 PM  Pager: [phone]  Between 7am to 7pm - call Pager - [phone]  After 7pm go to [email or web address] - password [email or web address]  Call night coverage person covering after 7pm

## 2014-08-10 LAB — CBC
HEMATOCRIT: 25.7 % — AB (ref 39.0–52.0)
Hemoglobin: 8.9 g/dL — ABNORMAL LOW (ref 13.0–17.0)
MCH: 29.7 pg (ref 26.0–34.0)
MCHC: 34.6 g/dL (ref 30.0–36.0)
MCV: 85.7 fL (ref 78.0–100.0)
PLATELETS: 230 10*3/uL (ref 150–400)
RBC: 3 MIL/uL — ABNORMAL LOW (ref 4.22–5.81)
RDW: 14.6 % (ref 11.5–15.5)
WBC: 7.2 10*3/uL (ref 4.0–10.5)

## 2014-08-10 LAB — TYPE AND SCREEN
ABO/RH(D): A POS
ANTIBODY SCREEN: NEGATIVE
Unit division: 0

## 2014-08-10 LAB — BASIC METABOLIC PANEL
Anion gap: 9 (ref 5–15)
BUN: 10 mg/dL (ref 6–20)
CALCIUM: 8.7 mg/dL — AB (ref 8.9–10.3)
CO2: 30 mmol/L (ref 22–32)
Chloride: 93 mmol/L — ABNORMAL LOW (ref 101–111)
Creatinine, Ser: 0.88 mg/dL (ref 0.61–1.24)
GFR calc Af Amer: >60 mL/min (ref >60)
Glucose, Bld: 150 mg/dL — ABNORMAL HIGH (ref 65–99)
POTASSIUM: 3.9 mmol/L (ref 3.5–5.1)
SODIUM: 132 mmol/L — AB (ref 135–145)

## 2014-08-10 LAB — MAGNESIUM: MAGNESIUM: 1.7 mg/dL (ref 1.7–2.4)

## 2014-08-10 LAB — GLUCOSE, CAPILLARY
Glucose-Capillary: 161 mg/dL — ABNORMAL HIGH (ref 65–99)
Glucose-Capillary: 256 mg/dL — ABNORMAL HIGH (ref 65–99)

## 2014-08-10 MED ORDER — FUROSEMIDE 20 MG PO TABS
ORAL_TABLET | ORAL | Status: DC
Start: 2014-08-10 — End: 2014-09-01

## 2014-08-10 NOTE — Discharge Instructions (Signed)
[facility]      [facility] [phone]  OPEN ABDOMINAL SURGERY: POST OP INSTRUCTIONS  Always review your discharge instruction sheet given to you by the facility where your surgery was performed.  IF YOU HAVE DISABILITY OR FAMILY LEAVE FORMS, YOU MUST BRING THEM TO THE OFFICE FOR PROCESSING.  PLEASE DO NOT GIVE THEM TO YOUR DOCTOR.  1. A prescription for pain medication may be given to you upon discharge.  Take your pain medication as prescribed, if needed.  If narcotic pain medicine is not needed, then you may take acetaminophen (Tylenol) or ibuprofen (Advil) as needed. 2. Take your usually prescribed medications unless otherwise directed. 3. If you need a refill on your pain medication, please contact your pharmacy. They will contact our office to request authorization.  Prescriptions will not be filled after 5pm or on week-ends. 4. You should follow a light diet the first few days after arrival home, such as soup and crackers, pudding, etc.unless your doctor has advised otherwise. A high-fiber, low fat diet can be resumed as tolerated.   Be sure to include lots of fluids daily. Most patients will experience some swelling and bruising on the chest and neck area.  Ice packs will help.  Swelling and bruising can take several days to resolve 5. Most patients will experience some swelling and bruising in the area of the incision. Ice pack will help. Swelling and bruising can take several days to resolve..  6. It is common to experience some constipation if taking pain medication after surgery.  Increasing fluid intake and taking a stool softener will usually help or prevent this problem from occurring.  A mild laxative (Milk of Magnesia or Miralax) should be taken according to package directions if there are no bowel movements after 48 hours. 7.  You may have steri-strips (small skin tapes) in place directly over the incision.  These strips should be left on the skin for 7-10 days.  If your  surgeon used skin glue on the incision, you may shower in 24 hours.  The glue will flake off over the next 2-3 weeks.  Any sutures or staples will be removed at the office during your follow-up visit. You may find that a light gauze bandage over your incision may keep your staples from being rubbed or pulled. You may shower and replace the bandage daily. 8. ACTIVITIES:  You may resume regular (light) daily activities beginning the next day--such as daily self-care, walking, climbing stairs--gradually increasing activities as tolerated.  You may have sexual intercourse when it is comfortable.  Refrain from any heavy lifting or straining until approved by your doctor. a. You may drive when you no longer are taking prescription pain medication, you can comfortably wear a seatbelt, and you can safely maneuver your car and apply brakes b. Return to Work: ___________________________________ 9. You should see your doctor in the office for a follow-up appointment approximately two weeks after your surgery.  Make sure that you call for this appointment within a day or two after you arrive home to insure a convenient appointment time. OTHER INSTRUCTIONS:  _____________________________________________________________ _____________________________________________________________  WHEN TO CALL YOUR DOCTOR: 1. Fever over 101.0 2. Inability to urinate 3. Nausea and/or vomiting 4. Extreme swelling or bruising 5. Continued bleeding from incision. 6. Increased pain, redness, or drainage from the incision. 7. Difficulty swallowing or breathing 8. Muscle cramping or spasms. 9. Numbness or tingling in hands or feet or around lips.  The clinic staff is available to   answer your questions during regular business hours.  Please don't hesitate to call and ask to speak to one of the nurses if you have concerns.  For further questions, please visit [email or web address]   

## 2014-08-10 NOTE — Progress Notes (Signed)
8 Days Post-Op  Subjective: He really looks and feels much better today Tolerating po Having BM's  Objective: Vital signs in last 24 hours: Temp:  [97.8 F (36.6 C)-98.6 F (37 C)] 98.6 F (37 C) (05/15 0526) Pulse Rate:  [60-75] 73 (05/15 0526) Resp:  [14-20] 20 (05/15 0526) BP: (128-140)/(59-73) 132/73 mmHg (05/15 0526) SpO2:  [93 %-97 %] 94 % (05/15 0526) Weight:  [84.2 kg (185 lb 10 oz)] 84.2 kg (185 lb 10 oz) (05/15 0526) Last BM Date: 08/08/14  Intake/Output from previous day: 05/14 0701 - 05/15 0700 In: 1875 [P.O.:1220; I.V.:200; Blood:300] Out: 4350 [Urine:4350] Intake/Output this shift:    Abdomen soft, incision clean  Lab Results:   Recent Labs  08/09/14 0510 08/10/14 0500  WBC 7.3 7.2  HGB 7.7* 8.9*  HCT 21.3* 25.7*  PLT 225 230   BMET  Recent Labs  08/09/14 0510 08/10/14 0500  NA 127* 132*  K 3.5 3.9  CL 90* 93*  CO2 28 30  GLUCOSE 184* 150*  BUN 13 10  CREATININE 0.79 0.88  CALCIUM 8.3* 8.7*   PT/INR No results for input(s): LABPROT, INR in the last 72 hours. ABG No results for input(s): PHART, HCO3 in the last 72 hours.  Invalid input(s): PCO2, PO2  Studies/Results: No results found.  Anti-infectives: Anti-infectives    Start     Dose/Rate Route Frequency Ordered Stop   08/03/14 2200  azithromycin (ZITHROMAX) 500 mg in dextrose 5 % 250 mL IVPB     500 mg 250 mL/hr over 60 Minutes Intravenous Every 24 hours 08/03/14 0928 08/03/14 2339   08/03/14 2200  cefTRIAXone (ROCEPHIN) 1 g in dextrose 5 % 50 mL IVPB     1 g 100 mL/hr over 30 Minutes Intravenous Every 24 hours 08/03/14 0928 08/03/14 2308   08/02/14 1550  ceFAZolin (ANCEF) 2-3 GM-% IVPB SOLR    Comments:  Marinda Elk   : cabinet override      08/02/14 1550 08/02/14 1615   07/30/14 0800  ceFAZolin (ANCEF) IVPB 2 g/50 mL premix     2 g 100 mL/hr over 30 Minutes Intravenous To Surgery 07/30/14 0746 07/31/14 0800   07/25/14 2245  cefTRIAXone (ROCEPHIN) 1 g in dextrose 5 %  50 mL IVPB  Status:  Discontinued     1 g 100 mL/hr over 30 Minutes Intravenous Every 24 hours 07/25/14 2234 08/03/14 0928   07/25/14 2245  azithromycin (ZITHROMAX) 500 mg in dextrose 5 % 250 mL IVPB  Status:  Discontinued     500 mg 250 mL/hr over 60 Minutes Intravenous Every 24 hours 07/25/14 2234 08/03/14 0928      Assessment/Plan: s/p Procedure(s): EXPLORATORY LAPAROTOMY LYSIS OF ADHESIONS (N/A)  Doing well Ok for discharge from surgical standpoint.  To follow up in our office for staple removal this week  LOS: 16 days    Nicholaos Schippers A 08/10/2014

## 2014-08-10 NOTE — Discharge Summary (Signed)
CATARINO VOLD, is a 74 y.o. male  DOB 04/06/1940  MRN [id number].  Admission date:  07/25/2014  Admitting Physician  Jani Gravel, MD  Discharge Date:  08/10/2014   Primary MD   Melinda Crutch, MD  Recommendations for primary care physician for things to follow:  - Please check CBC, BMP during next visit, I'll just patient diuresis as needed, is discharged on Lasix 40 mg oral daily 5 days then 20 mg oral daily. - - repeat chest x-ray in 4-6 weeks to ensure complete resolution of pneumonia.  Admission Diagnosis  Partial small bowel obstruction [K56.69]   Discharge Diagnosis  Partial small bowel obstruction [K56.69]    Principal Problem:   SBO (small bowel obstruction) Active Problems:   CAD (coronary artery disease)   Diabetes mellitus   HTN (hypertension)   Partial small bowel obstruction   CAP (community acquired pneumonia)   Hypokalemia   Acute on chronic diastolic CHF (congestive heart failure), NYHA class 1      Past Medical History  Diagnosis Date  . CAD (coronary artery disease)   . History of acute inferior wall MI   . Diabetes mellitus   . HTN (hypertension)   . Dyslipidemia   . 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 BPH (benign prostatic hyperplasia)   . 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 Hyponatremia   . Acute on chronic diastolic CHF (congestive heart failure), NYHA class 1 08/09/2014    Past Surgical History  Procedure Laterality Date  . Coronary artery bypass graft  redo 2005    free Rima to OM, svg-diag,svg-pda  . Tonsillectomy    . Rotator cuff repair    . Melanoma excision    . Eye surgery      BILATERAL   . Kyphoplasty  02/29/2012    Procedure: KYPHOPLASTY;  Surgeon: Sinclair Ship, MD;  Location: [facility];  Service: Orthopedics;  Laterality: Bilateral;  T 10 kyphoplasty  . Laparotomy N/A 08/02/2014    Procedure: EXPLORATORY LAPAROTOMY LYSIS OF ADHESIONS;  Surgeon:  Donnie Mesa, MD;  Location: [facility];  Service: General;  Laterality: N/A;       History of present illness and  Hospital Course:     Kindly see H&P for history of present illness and admission details, please review complete Labs, Consult reports and Test reports for all details in brief  HPI  from the history and physical done on the day of admission    Hospital Course   Patient is a 74 year old male with CAD, diabetes type 2, hypertension, hyperlipidemia presented with abdominal pain and described as periumbilical, sharp with nausea and vomiting. He denied any fevers or chills, diarrhea or melena or hematochezia. Patient underwent CT of the abdomen and pelvis which showed partial small bowel obstruction. Patient was admitted for further workup.  SBO (small bowel obstruction) - Status post exploratory laparoscopy with lysis of adhesions 08/02/14 - management per surgery recommendations during hospital stay. - Initially on TPN, stopped - Advanced to  soft diet, which he is tolerating very well.  Dyspnea secondary to volume overload, acute on chronic systolic and diastolic CHF. - 2-D echo showing EF 45%, with a grade 2 diastolic dysfunction - Patient with increased from 174 on admission to peak of 207, and was 19 L positive. - Improved with IV diuresis, will discharge on oral Lasix 40 mg daily 5 days, then 20 mg oral daily. - A shunt is on lisinopril and metoprolol, follow with cardiology as an outpatient.   CAD (coronary artery disease): - Currently stable, no chest pain , aspirin and Plavix resumed on discharge.   Diabetes mellitus - Hemoglobin A1c 6.4, good outpatient glycemic control, - Resume home medication   HTN (hypertension) - Resume home medication on discharge  Questionable left pleural effusion on CT abdomen, CAP on CXR.  - No coughing or respiratory symptoms, afebrile. Chest x-ray showed patchy infiltrates in the upper lobe and left lower lobe - Does not need  any further antibiotics, on day #8, completed antibiotics for Zithromax/Rocephin - repeat chest x-ray in 4-6 weeks to ensure complete resolution of pneumonia.  Hyponatremia - Most likely in the setting of volume overload, monitor on diuresis.  Normocytic anemia - Patient continues to have slow gradual drop in his hemoglobin, transfused 1 unit packed red blood cells on 5/14 given his symptomatic anemia, as he report significant fatigue, and shortness of breath, hemoglobin much improved 8.9 on discharge.  Hypertension - Continue with Coreg, losartan, amlodipine  Hyperlipidemia - Continue with statin   Discharge Condition: Stable   Follow UP  Follow-up Information    Follow up with TSUEI,MATTHEW K., MD. Schedule an appointment as soon as possible for a visit in 2 days.   Specialty:  General Surgery   Why:  For suture removal, Nurses only visit.  appointment with Dr. Georgette Dover in 2 to 3 weeks   Contact information:   [place] [id number] [phone]       Follow up with  Melinda Crutch, MD. Schedule an appointment as soon as possible for a visit in 1 week.   Specialty:  Family Medicine   Why:  Posthospitalization follow-up   Contact information:   [place] [address] [phone]       Follow up with Peter Martinique, MD. Call in 2 weeks.   Specialty:  Cardiology   Why:  Posthospitalization follow-up for CHF   Contact information:   [place] [address] [phone]         Discharge Instructions  and  Discharge Medications     Discharge Instructions    Discharge instructions    Complete by:  As directed   Follow with Primary MD  Melinda Crutch, MD in 7 days   Get CBC, CMP, 2 view Chest X ray checked  by Primary MD next visit.    Activity: As tolerated with Full fall precautions use walker/cane & assistance as needed   Disposition Home    Diet: Heart Healthy , carbohydrate modified , with  feeding assistance and aspiration precautions.  For Heart failure patients - Check your Weight same time everyday, if you gain over 2 pounds, or you develop in leg swelling, experience more shortness of breath or chest pain, call your Primary MD immediately. Follow Cardiac Low Salt Diet and 1.5 lit/day fluid restriction.   On your next visit with your primary care physician please Get Medicines reviewed and adjusted.   Please request  your Prim.MD to go over all Hospital Tests and Procedure/Radiological results at the follow up, please get all Hospital records sent to your Prim MD by signing hospital release before you go home.   If you experience worsening of your admission symptoms, develop shortness of breath, life threatening emergency, suicidal or homicidal thoughts you must seek medical attention immediately by calling 911 or calling your MD immediately  if symptoms less severe.  You Must read complete instructions/literature along with all the possible adverse reactions/side effects for all the Medicines you take and that have been prescribed to you. Take any new Medicines after you have completely understood and accpet all the possible adverse reactions/side effects.   Do not drive, operating heavy machinery, perform activities at heights, swimming or participation in water activities or provide baby sitting services if your were admitted for syncope or siezures until you have seen by Primary MD or a Neurologist and advised to do so again.  Do not drive when taking Pain medications.    Do not take more than prescribed Pain, Sleep and Anxiety Medications  Special Instructions: If you have smoked or chewed Tobacco  in the last 2 yrs please stop smoking, stop any regular Alcohol  and or any Recreational drug use.  Wear Seat belts while driving.   Please note  You were cared for by a hospitalist during your hospital stay. If you have any questions about your discharge medications or  the care you received while you were in the hospital after you are discharged, you can call the unit and asked to speak with the hospitalist on call if the hospitalist that took care of you is not available. Once you are discharged, your primary care physician will handle any further medical issues. Please note that NO REFILLS for any discharge medications will be authorized once you are discharged, as it is imperative that you return to your primary care physician (or establish a relationship with a primary care physician if you do not have one) for your aftercare needs so that they can reassess your need for medications and monitor your lab values.     Increase activity slowly    Complete by:  As directed             Medication List    TAKE these medications        ALPRAZolam 0.5 MG tablet  Commonly known as:  XANAX  Take 0.5 mg by mouth 3 (three) times daily as needed for anxiety.     amLODipine 5 MG tablet  Commonly known as:  NORVASC  Take 1 tablet (5 mg total) by mouth daily.     aspirin 81 MG tablet  Take 81 mg by mouth daily.     carvedilol 12.5 MG tablet  Commonly known as:  COREG  TAKE 1 TABLET TWICE A DAY     clopidogrel 75 MG tablet  Commonly known as:  PLAVIX  Take 1 tablet (75 mg total) by mouth daily.     furosemide 20 MG tablet  Commonly known as:  LASIX  Please use 2 tablets (40 mg) oral daily for 5 days, then decrease to 1 tablet (20 mg) oral daily thereafter.     glimepiride 4 MG tablet  Commonly known as:  AMARYL  Take 4 mg by mouth daily before breakfast.     losartan 100 MG tablet  Commonly known as:  COZAAR  Take 100 mg by mouth daily.     nitroGLYCERIN 0.4 MG  SL tablet  Commonly known as:  NITROSTAT  Place 1 tablet (0.4 mg total) under the tongue every 5 (five) minutes as needed for chest pain.     pioglitazone 45 MG tablet  Commonly known as:  ACTOS  Take 45 mg by mouth daily.     simvastatin 20 MG tablet  Commonly known as:  ZOCOR  Take 1  tablet (20 mg total) by mouth at bedtime.          Diet and Activity recommendation: See Discharge Instructions above   Consults obtained -  Gen. Surgery cardiology   Major procedures and Radiology Reports - PLEASE review detailed and final reports for all details, in brief -   EXPLORATORY LAPAROTOMY LYSIS OF ADHESIONS (N/A)    Dg Chest 2 View  07/26/2014   CLINICAL DATA:  Pleural effusion.  EXAM: CHEST  2 VIEW  COMPARISON:  02/17/2012  FINDINGS: Patient's nasogastric tube in place, tip coiled back upon itself into the lower esophagus. Status post median sternotomy and CABG. Heart is enlarged. There is mild interstitial edema. There patchy infiltrates within the left upper lobe and left lower lobe. Left pleural effusion. Persistent wedge compression of T10, unchanged. Mild dilatation of small bowel loops, unchanged.  IMPRESSION: 1. Cardiomegaly and mild edema. 2. Left upper lobe and left lower lobe infiltrates associated with left pleural effusion. 3. Nasogastric tube coiled back upon itself into lower esophagus. 4. The salient findings were discussed with Lordis on 07/26/2014 at 8:04 pm.   Electronically Signed   By: Nolon Nations M.D.   On: 07/26/2014 20:04   Ct Abdomen Pelvis W Contrast  07/25/2014   CLINICAL DATA:  Mid to lower abdominal pain since 12 hours ago. Vomiting.  EXAM: CT ABDOMEN AND PELVIS WITH CONTRAST  TECHNIQUE: Multidetector CT imaging of the abdomen and pelvis was performed using the standard protocol following bolus administration of intravenous contrast.  CONTRAST:  122mL OMNIPAQUE IOHEXOL 300 MG/ML  SOLN  COMPARISON:  None.  FINDINGS: There is a left effusion layering dependently with patchy density in the dependent left lung that could be atelectasis or pneumonia.  The liver shows a pattern raising the possibility of early cirrhosis. There are 2 benign calcifications in the left lobe. There are calcified stones in the gallbladder neck. No stone is seen along the  course of the common duct. The spleen is normal. There are bilateral low-density adrenal adenomas. The kidneys are normal. The aorta and its branch vessels show extensive atherosclerotic disease. No aneurysm. Bladder, prostate gland and seminal vesicles are unremarkable.  There are dilated fluid and air-filled loops of small intestine consistent with partial small bowel obstruction. There is a caliber change at the mid ileum. Specific obstructing lesion is not identified. There is free intraperitoneal fluid but no free intraperitoneal air are identified. Old vertebral augmentation at T10. Chronic bilateral pars defects at L5.  IMPRESSION: Partial small bowel obstruction. Level of the obstruction is probably mid ileum. Specific etiology not demonstrated.  Free fluid which could relate to the small bowel obstruction or could be secondary to cirrhosis. The patient does appear to have cirrhosis of the liver.  Gallstones.  Advanced widespread atherosclerosis.  Left pleural effusion. Atelectasis and/or pneumonia in the dependent left lower lobe.   Electronically Signed   By: Nelson Chimes M.D.   On: 07/25/2014 21:28   Dg Abd 2 Views  08/02/2014   CLINICAL DATA:  75 year old male with a history of small bowel obstruction  EXAM: ABDOMEN - 2  VIEW  COMPARISON:  08/01/2014, 07/30/2014, 07/29/2014, CT 07/25/2014  FINDINGS: Multiple borderline dilated small bowel loops within the abdomen on the supine image. Upright image demonstrates air-fluid levels.  Paucity of distal colonic gas.  Surgical changes of prior median sternotomy, as well as surgical clips of the left upper quadrant.  Stones within the gallbladder again visualized in the right upper quadrant.  No displaced fracture.  Vascular calcifications.  IMPRESSION: Evidence of persisting small bowel obstruction, with multiple borderline dilated small bowel loops and paucity of colonic gas.  Signed,  Dulcy Fanny. Earleen Newport, DO  [facility]   [facility]   Electronically Signed   By: Corrie Mckusick D.O.   On: 08/02/2014 07:31   Dg Abd 2 Views  08/01/2014   CLINICAL DATA:  Follow-up of small bowel obstruction.  EXAM: ABDOMEN - 2 VIEW  COMPARISON:  Supine and upright abdominal films of Jul 30, 2014  FINDINGS: There has been interval removal of the esophagogastric tube. There remain loops of mildly distended gas and fluid-filled small bowel in the midline. The volume of gas present has decreased somewhat. There is some gas and fluid within the colon. Previously demonstrated colonic contrast has cleared. A small amount of air is present within the stomach. No definite extraluminal gas collections are demonstrated.  IMPRESSION: Persistent mid to distal relatively high-grade small bowel obstruction. Interval removal of the esophagogastric tube.  If the patient's clinical status has deteriorated, abdominal and pelvic CT scanning now would be useful.   Electronically Signed   By: Kirtis  Martinique M.D.   On: 08/01/2014 10:38   Dg Abd 2 Views  07/30/2014   CLINICAL DATA:  Small-bowel obstruction.  EXAM: ABDOMEN - 2 VIEW  COMPARISON:  07/29/2014 .  FINDINGS: NG tube in stable position. Soft tissue structures are unremarkable. Persistent dilated loops of small bowel are again noted consistent with small bowel obstruction. Oral contrast again noted colon. No free air. Prior median sternotomy. Surgical clips left upper quadrant.  IMPRESSION: Persistent unchanged dilated loops of small bowel consistent with small bowel obstruction. Oral contrast is noted in the colon. No free air. NG tube in stable position.   Electronically Signed   By: Marcello Moores  Register   On: 07/30/2014 07:11   Dg Abd 2 Views  07/28/2014   CLINICAL DATA:  Ileus, nausea.  EXAM: ABDOMEN - 2 VIEW  COMPARISON:  07/27/2014  FINDINGS: NG tube is present in the stomach. There are dilated small bowel loops with air-fluid levels. Degree of small bowel dilatation has increased since prior study. Colon  is decompressed. No free air organomegaly. No suspicious calcification. Vascular calcifications noted in the aorta and iliac vessels. No acute bony abnormality.  IMPRESSION: Worsening small bowel dilatation compatible with worsening small bowel obstruction.   Electronically Signed   By: Rolm Baptise M.D.   On: 07/28/2014 08:57   Dg Abd Portable 1v  07/29/2014   CLINICAL DATA:  24 hour delay with Gastrografin. Small bowel protocol.  EXAM: PORTABLE ABDOMEN - 1 VIEW  COMPARISON:  07/28/2014  FINDINGS: Persistent gaseous distended loops of small bowel are demonstrated within the central abdomen measuring up to 3.5 cm, compatible with small bowel obstruction. Oral contrast material is demonstrated to the right lower quadrant, within the cecum and proximal ascending colon. NG tube projects over the left upper quadrant. Left upper quadrant surgical clips. Lower lumbar spine degenerative changes.  IMPRESSION: Oral contrast material demonstrated within the right lower quadrant, likely within the cecum and  ascending colon.  Persistent gaseous distended loops of small bowel, compatible with obstruction.   Electronically Signed   By: Lovey Newcomer M.D.   On: 07/29/2014 13:40   Dg Abd Portable 1v-small Bowel Obstruction Protocol-initial, 8 Hr Delay  07/29/2014   CLINICAL DATA:  Small bowel obstruction  EXAM: PORTABLE ABDOMEN - 1 VIEW  COMPARISON:  07/28/2014  FINDINGS: The nasogastric tube extends into the proximal stomach with the side-port in the region of the EG junction.  There is persistent dilatation and stacking of small bowel loops consistent with small bowel obstruction. No free air is evident. No interval change is evident.  IMPRESSION: Persistent small bowel obstruction.   Electronically Signed   By: Andreas Newport M.D.   On: 07/29/2014 02:27   Dg Abd Portable 1v  07/27/2014   CLINICAL DATA:  Small-bowel obstruction.  EXAM: PORTABLE ABDOMEN - 1 VIEW  COMPARISON:  07/25/2014  FINDINGS: Nasogastric tube is  partially imaged, tip overlying the level of the stomach. There are persistent dilated small bowel loops in the central abdomen. Nondilated loops of large bowel persist. There is a small amount of residual contrast in the bladder.  IMPRESSION: 1. Persistent small bowel dilatation. 2. Residual contrast in the bladder.   Electronically Signed   By: Nolon Nations M.D.   On: 07/27/2014 09:10   Dg Abd Portable 1v  07/25/2014   CLINICAL DATA:  Abdominal pain.  Nausea and vomiting for 1 day.  EXAM: PORTABLE ABDOMEN - 1 VIEW  COMPARISON:  CT of earlier in the day  FINDINGS: Single supine portable view. Contrast within the urinary bladder. Gastric distension with possible nasogastric tube, incompletely imaged. Proximal small bowel loops measure up to 3.2 cm. Gas and stool within the colon. Advanced atherosclerosis.  IMPRESSION: Partial small bowel obstruction, without free intraperitoneal air or other acute complication.   Electronically Signed   By: Abigail Miyamoto M.D.   On: 07/25/2014 23:46    Micro Results     Recent Results (from the past 240 hour(s))  MRSA PCR Screening     Status: None   Collection Time: 08/02/14  2:32 PM  Result Value Ref Range Status   MRSA by PCR NEGATIVE NEGATIVE Final    Comment:        The GeneXpert MRSA Assay (FDA approved for NASAL specimens only), is one component of a comprehensive MRSA colonization surveillance program. It is not intended to diagnose MRSA infection nor to guide or monitor treatment for MRSA infections.        Today   Subjective:   Lucienne Minks today has no headache,no chest abdominal pain,no new weakness tingling or numbness, feels much better wants to go home today. Reports multiple bowel movement, good appetite, tolerating diet, ambulate without assistance,  Objective:   Blood pressure 132/73, pulse 73, temperature 98.6 F (37 C), temperature source Oral, resp. rate 20, height 5' 9.5" (1.765 m), weight 84.2 kg (185 lb 10 oz), SpO2  94 %.   Intake/Output Summary (Last 24 hours) at 08/10/14 1055 Last data filed at 08/09/14 2117  Gross per 24 hour  Intake   1235 ml  Output   3200 ml  Net  -1965 ml    Exam Awake Alert, Oriented x 3, No new F.N deficits, Normal affect [place].AT,PERRAL Supple Neck,No JVD, No cervical lymphadenopathy appriciated.  Symmetrical Chest wall movement, Good air movement bilaterally, CTAB RRR,No Gallops,Rubs or new Murmurs, No Parasternal Heave +ve B.Sounds, Abd Soft, Non tender, No organomegaly appriciated, No rebound -  guarding or rigidity, abdominal surgical wound with staples , no oozing or discharge . No Cyanosis, Clubbing or edema, No new Rash or bruise  Data Review   CBC w Diff: Lab Results  Component Value Date   WBC 7.2 08/10/2014   HGB 8.9* 08/10/2014   HCT 25.7* 08/10/2014   PLT 230 08/10/2014   LYMPHOPCT 11* 07/28/2014   MONOPCT 11 07/28/2014   EOSPCT 1 07/28/2014   BASOPCT 0 07/28/2014    CMP: Lab Results  Component Value Date   NA 132* 08/10/2014   K 3.9 08/10/2014   CL 93* 08/10/2014   CO2 30 08/10/2014   BUN 10 08/10/2014   CREATININE 0.88 08/10/2014   PROT 5.7* 08/07/2014   ALBUMIN 2.6* 08/07/2014   BILITOT 0.9 08/07/2014   ALKPHOS 102 08/07/2014   AST 53* 08/07/2014   ALT 45 08/07/2014  .   Total Time in preparing paper work, data evaluation and todays exam - 35 minutes  Dayleen Beske M.D on 08/10/2014 at 10:55 AM  [facility]   Office  [phone]

## 2014-09-01 ENCOUNTER — Encounter: Payer: Self-pay | Admitting: Cardiology

## 2014-09-01 ENCOUNTER — Ambulatory Visit (INDEPENDENT_AMBULATORY_CARE_PROVIDER_SITE_OTHER): Payer: Medicare Other | Admitting: Cardiology

## 2014-09-01 VITALS — BP 122/72 | HR 64 | Ht 69.0 in | Wt 162.5 lb

## 2014-09-01 DIAGNOSIS — I2510 Atherosclerotic heart disease of native coronary artery without angina pectoris: Secondary | ICD-10-CM | POA: Diagnosis not present

## 2014-09-01 DIAGNOSIS — I10 Essential (primary) hypertension: Secondary | ICD-10-CM | POA: Diagnosis not present

## 2014-09-01 DIAGNOSIS — Z951 Presence of aortocoronary bypass graft: Secondary | ICD-10-CM

## 2014-09-01 DIAGNOSIS — I5031 Acute diastolic (congestive) heart failure: Secondary | ICD-10-CM | POA: Insufficient documentation

## 2014-09-01 DIAGNOSIS — E785 Hyperlipidemia, unspecified: Secondary | ICD-10-CM

## 2014-09-01 NOTE — Progress Notes (Signed)
[name] Date of Birth: 02/01/1941 Medical Record #[id number]  History of Present Illness: [name] is seen for post hospital follow up. He has a history of coronary disease and is status post redo coronary bypass surgery in 2005 after emergent stenting of the left main. He has had an old anterior myocardial infarction.  He was recently admitted 4/29-5/15/16 with SBO and underwent surgery with exploratory lap and lysis of adhesions. His post op course was complicated by marked volume overload due to IVF with over 20 lb weight gain. He was diuresed. Echo showed EF 45%. Since DC he has done well. No chest pain or dyspnea. Edema has resolved. He still feels weak. Bowels are working [employer]. No Nausea or abdominal pain.    Current Outpatient Prescriptions on File Prior to Visit  Medication Sig Dispense Refill  . ALPRAZolam (XANAX) 0.5 MG tablet Take 0.5 mg by mouth 3 (three) times daily as needed for anxiety.     Marland Kitchen amLODipine (NORVASC) 5 MG tablet Take 1 tablet (5 mg total) by mouth daily. 90 tablet 1  . aspirin 81 MG tablet Take 81 mg by mouth daily.      . carvedilol (COREG) 12.5 MG tablet TAKE 1 TABLET TWICE A DAY 180 tablet 2  . clopidogrel (PLAVIX) 75 MG tablet Take 1 tablet (75 mg total) by mouth daily. 90 tablet 2  . glimepiride (AMARYL) 4 MG tablet Take 4 mg by mouth daily before breakfast.     . losartan (COZAAR) 100 MG tablet Take 100 mg by mouth daily.     . nitroGLYCERIN (NITROSTAT) 0.4 MG SL tablet Place 1 tablet (0.4 mg total) under the tongue every 5 (five) minutes as needed for chest pain. 25 tablet 3  . pioglitazone (ACTOS) 45 MG tablet Take 45 mg by mouth daily.     . simvastatin (ZOCOR) 20 MG tablet Take 1 tablet (20 mg total) by mouth at bedtime. 90 tablet 0   No current facility-administered medications on file prior to visit.    Allergies  Allergen Reactions  . Hctz [Hydrochlorothiazide] Other (See Comments)    Hyponatremia     Past Medical History  Diagnosis  Date  . CAD (coronary artery disease)   . History of acute inferior wall MI   . Diabetes mellitus   . HTN (hypertension)   . Dyslipidemia   . 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 BPH (benign prostatic hyperplasia)   . 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 Hyponatremia   . Acute on chronic diastolic CHF (congestive heart failure), NYHA class 1 08/09/2014    Past Surgical History  Procedure Laterality Date  . Coronary artery bypass graft  redo 2005    free Rima to OM, svg-diag,svg-pda  . Tonsillectomy    . Rotator cuff repair    . Melanoma excision    . Eye surgery      BILATERAL   . Kyphoplasty  02/29/2012    Procedure: KYPHOPLASTY;  Surgeon: Sinclair Ship, MD;  Location: [facility];  Service: Orthopedics;  Laterality: Bilateral;  T 10 kyphoplasty  . Laparotomy N/A 08/02/2014    Procedure: EXPLORATORY LAPAROTOMY LYSIS OF ADHESIONS;  Surgeon: Donnie Mesa, MD;  Location: [facility];  Service: General;  Laterality: N/A;    History  Smoking status  . Former Smoker  . Quit date: 02/22/1986  Smokeless tobacco  . Not on file    History  Alcohol Use No    Family History  Problem Relation Age of Onset  . Dementia Mother     Review of Systems: As noted in history of present illness.  All other systems were reviewed and are negative.  Physical Exam: BP 122/72 mmHg  Pulse 64  Ht 5\' 9"  (1.753 m)  Wt 73.71 kg (162 lb 8 oz)  BMI 23.99 kg/m2 He is a pleasant white male in no acute distress.The patient is alert and oriented x 3.    The skin is warm and dry.  Color is normal.  The HEENT exam is unremarkable. Sclera are clear. PERRLA. The mucous membranes are moist.  The carotids are 2+ without bruits.  There is no thyromegaly.  There is no JVD.  The lungs are clear.   The heart exam reveals a regular rate with a normal S1 and S2.  There is a grade 1-6/1 systolic ejection murmur the left sternal border.  The PMI is not displaced.   Abdominal exam is nontender. Bowel sounds are positive.  There is no hepatosplenomegaly or tenderness.  There are no masses.  Exam of the legs reveal trace edema in the left lower extremity.    The distal pulses are intact.  Cranial nerves II - XII are intact.  Motor and sensory functions are intact.  The gait is normal.  LABORATORY DATA: Labs dated 08/15/14 shows Hgb 10.9. Glucose 201. Creatinine 1.01.   Assessment / Plan: 1. Coronary disease status post redo CABG. Remote anterior myocardial infarction. Clinically doing very well. Continue his current medical therapy.  2. Acute diastolic CHF secondary to volume resuscitation with SBO. Now back to baseline. Since this was iatrogenic I think he can safely stop lasix at this time.   3. Hypertension,  well controlled  4. Hyperlipidemia on Zocor.

## 2014-09-01 NOTE — Patient Instructions (Signed)
You can stop taking lasix.  Continue your other therapy  I will see you in 6 months.

## 2014-09-08 ENCOUNTER — Encounter: Payer: Self-pay | Admitting: Cardiology

## 2014-09-19 ENCOUNTER — Other Ambulatory Visit: Payer: Self-pay

## 2014-09-19 MED ORDER — SIMVASTATIN 20 MG PO TABS: 20.0000 mg | ORAL_TABLET | Freq: Every day | ORAL | Status: DC

## 2014-10-22 DIAGNOSIS — F419 Anxiety disorder, unspecified: Secondary | ICD-10-CM | POA: Insufficient documentation

## 2014-10-24 ENCOUNTER — Other Ambulatory Visit: Payer: Self-pay | Admitting: *Deleted

## 2014-10-24 DIAGNOSIS — I739 Peripheral vascular disease, unspecified: Secondary | ICD-10-CM

## 2014-10-28 ENCOUNTER — Other Ambulatory Visit: Payer: Self-pay

## 2014-10-28 MED ORDER — NITROGLYCERIN 0.4 MG SL SUBL: 0.4000 mg | SUBLINGUAL_TABLET | SUBLINGUAL | Status: DC | PRN

## 2014-11-26 ENCOUNTER — Encounter: Payer: Self-pay | Admitting: Vascular Surgery

## 2014-11-27 ENCOUNTER — Ambulatory Visit (HOSPITAL_COMMUNITY)
Admission: RE | Admit: 2014-11-27 | Discharge: 2014-11-27 | Disposition: A | Discharge status: Home / Self Care | Payer: Medicare Other | Source: Ambulatory Visit | Attending: Vascular Surgery | Admitting: Vascular Surgery

## 2014-11-27 ENCOUNTER — Encounter: Payer: Self-pay | Admitting: Vascular Surgery

## 2014-11-27 ENCOUNTER — Ambulatory Visit (INDEPENDENT_AMBULATORY_CARE_PROVIDER_SITE_OTHER): Payer: Medicare Other | Admitting: Vascular Surgery

## 2014-11-27 VITALS — BP 143/73 | HR 59 | Temp 98.1°F | Resp 12 | Ht 69.0 in | Wt 176.0 lb

## 2014-11-27 DIAGNOSIS — I10 Essential (primary) hypertension: Secondary | ICD-10-CM | POA: Insufficient documentation

## 2014-11-27 DIAGNOSIS — E785 Hyperlipidemia, unspecified: Secondary | ICD-10-CM | POA: Insufficient documentation

## 2014-11-27 DIAGNOSIS — I739 Peripheral vascular disease, unspecified: Secondary | ICD-10-CM | POA: Insufficient documentation

## 2014-11-27 DIAGNOSIS — E119 Type 2 diabetes mellitus without complications: Secondary | ICD-10-CM | POA: Insufficient documentation

## 2014-11-27 DIAGNOSIS — I2510 Atherosclerotic heart disease of native coronary artery without angina pectoris: Secondary | ICD-10-CM | POA: Insufficient documentation

## 2014-11-27 NOTE — Progress Notes (Signed)
Referred by:  Lona Kettle, MD [place], [place] [address]  Reason for referral: claudication  History of Present Illness  [name] is a 74 y.o. (Jun 01, 1940) male who presents with chief complaint: left hip pain. Onset of symptom occurred 1 year ago. This pain occurs after walking one block. Sometimes he is able to walk two blocks before his hip hurts. He never has pain at rest. Rest relieves the pain. This pain limits him from walking and working in the yard. He used to be more active and walk 30 mins daily. He denies any rest pain or non healing wounds. He denies any symptoms in his right leg. The patient reports an accident where he fell onto his left hip 4 years ago from a 10 foot ladder. He believes this is unassociated with his current pain because he was asymptomatic for three years following the accident. Atherosclerotic risk factors include: history of CAD s/p redo CABG in 2005, hypertension, hyperlipidemia, diabetes mellitus and former smoker.   His current medical problems are stable.   His past surgical history includes recent exploratory laparotomy for small bowel obstruction (May 2015).   He denies any chest pain or shortness of breath. Full ROS below.   Past Medical History  Diagnosis Date  . CAD (coronary artery disease)   . History of acute inferior wall MI   . Diabetes mellitus   . HTN (hypertension)   . Dyslipidemia   . 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 BPH (benign prostatic hyperplasia)   . 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 Hyponatremia   . Acute on chronic diastolic CHF (congestive heart failure), NYHA class 1 08/09/2014    Past Surgical History  Procedure Laterality Date  . Coronary artery bypass graft  redo 2005    free Rima to OM, svg-diag,svg-pda  . Tonsillectomy    . Rotator cuff repair    . Melanoma excision    . Eye surgery      BILATERAL   . Kyphoplasty  02/29/2012    Procedure: KYPHOPLASTY;  Surgeon: Sinclair Ship, MD;  Location: [facility];  Service: Orthopedics;  Laterality: Bilateral;  T 10 kyphoplasty  . Laparotomy N/A 08/02/2014    Procedure: EXPLORATORY LAPAROTOMY LYSIS OF ADHESIONS;  Surgeon: Donnie Mesa, MD;  Location: [facility];  Service: General;  Laterality: N/A;    Social History   Social History  . Marital Status: Married    Spouse Name: N/A  . Number of Children: 2  . Years of Education: N/A   Occupational History  . insurance    Social History Main Topics  . Smoking status: Former Smoker    Quit date: 02/22/1986  . Smokeless tobacco: Not on file  . Alcohol Use: No  . Drug Use: No  . Sexual Activity: Not on file   Other Topics Concern  . Not on file   Social History Narrative    Family History  Problem Relation Age of Onset  . Dementia Mother     Current Outpatient Prescriptions on File Prior to Visit  Medication Sig Dispense Refill  . ALPRAZolam (XANAX) 0.5 MG tablet Take 0.5 mg by mouth 3 (three) times daily as needed for anxiety.     Marland Kitchen amLODipine (NORVASC) 5 MG tablet Take 1 tablet (5 mg total) by mouth daily. 90 tablet 1  . aspirin 81 MG tablet Take 81 mg by mouth daily.      . carvedilol (  COREG) 12.5 MG tablet TAKE 1 TABLET TWICE A DAY 180 tablet 2  . clopidogrel (PLAVIX) 75 MG tablet Take 1 tablet (75 mg total) by mouth daily. 90 tablet 2  . glimepiride (AMARYL) 4 MG tablet Take 4 mg by mouth daily before breakfast.     . losartan (COZAAR) 100 MG tablet Take 100 mg by mouth daily.     . nitroGLYCERIN (NITROSTAT) 0.4 MG SL tablet Place 1 tablet (0.4 mg total) under the tongue every 5 (five) minutes as needed for chest pain. 25 tablet 3  . pioglitazone (ACTOS) 45 MG tablet Take 45 mg by mouth daily.     . simvastatin (ZOCOR) 20 MG tablet Take 1 tablet (20 mg total) by mouth at bedtime. 90 tablet 3   No current facility-administered medications on file prior to visit.    Allergies  Allergen Reactions  . Hctz [Hydrochlorothiazide] Other (See Comments)     Hyponatremia     REVIEW OF SYSTEMS:  (Positives checked otherwise negative)  CARDIOVASCULAR:  [ ]  chest pain, [ ]  chest pressure, [ ]  palpitations, [ ]  shortness of breath when laying flat, [ ]  shortness of breath with exertion,   [s ] pain in legs when walking, [ ]  pain in feet when laying flat, [ ]  history of blood clot in veins (DVT), [ ]  history of phlebitis, [ ]  swelling in legs, [ ]  varicose veins  PULMONARY:  [ ]  productive cough, [ ]  asthma, [ ]  wheezing  NEUROLOGIC:  [s ] weakness in legs, [ ]  numbness in arms or legs, [ ]  difficulty speaking or slurred speech, [ ]  temporary loss of vision in one eye, [ ]  dizziness  HEMATOLOGIC:  [x]  bleeding problems, [ ]  problems with blood clotting too easily  MUSCULOSKEL:  [ ]  joint pain, [ ]  joint swelling  GASTROINTEST:  [ ]   Vomiting blood, [ ]   Blood in stool     GENITOURINARY:  [ ]   Burning with urination, [ ]   Blood in urine  PSYCHIATRIC:  [ ]  history of major depression  INTEGUMENTARY:  [ ]  rashes, [ ]  ulcers  CONSTITUTIONAL:  [ ]  fever, [ ]  chills  For VQI Use Only  PRE-ADM LIVING: Home  AMB STATUS: Ambulatory  CAD Sx: History of MI, but no symptoms No MI within 6 months  PRIOR CHF: Mild, iatrogenic secondary to volume resuscitation with SBO.   STRESS TEST: [ ]  No, [ ]  Normal, [ ]  + ischemia, [ ]  + MI, [ ]  Both    Physical Examination Filed Vitals:   11/27/14 1255 11/27/14 1259  BP: 147/74 143/73  Pulse: 60 59  Temp: 98.1 F (36.7 C)   TempSrc: Oral   Resp: 12   Height: 5\' 9"  (1.753 m)   Weight: 176 lb (79.833 kg)   SpO2: 100%    Body mass index is 25.98 kg/(m^2).  General: A&O x 3, WDWN appears stated age  Head: /AT  Neck: Supple  Pulmonary: Sym exp, good air movt, CTAB, no rales, rhonchi  Cardiac: RRR, Nl S1, S2, no Murmurs, rubs or gallops, no carotid bruits.   Vascular: 1+ right femoral pulse, non-palpable left femoral pulse. Non palpable popliteal and pedal pulses bilaterally    Gastrointestinal: soft, NTND, no masses  Musculoskeletal: M/S 5/5 throughot, Extremities without ischemic changes  Neurologic: No focal deficits. Motor and sensory intact.   Psychiatric: Judgment intact, Mood & affect appropriatefor pt's clinical situation  Dermatologic: See M/S exam for extremity exam, no rashes  otherwise noted   Non-Invasive Vascular Imaging  ABI (Date: 11/27/2014)  R: 0.66, DP: monophasic, PT: monophasic, TBI: 0.46  L: non-compressible vessels, DP: monophasic, PT: monophasic, TBI: n/a  Outside Studies/Documentation 4 pages of outside documents were reviewed including: PCP.  Medical Decision Making  ABBY STINES is a 74 y.o. male who presents peripheral vascular disease with lifestyle limiting intermittent left hip claudication. .   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   He will need an aortogram with bilateral runoff with possible intervention. This will be scheduled at the patient's convenience.  He is currently on maximal medical management with ASA, a statin and plavix. He is a former smoker.   Virgina Jock, PA-C [facility] Office: [phone]  11/27/2014, 1:29 PM  This patient was seen and examined in conjunction with Dr. Oneida Alar.    History and exam findings as above. Patient with left hip pain which may be related to iliac artery occlusive disease. He also has evidence of lower extremity arterial occlusive disease on the right leg but not really any symptoms on that side. We have scheduled him for aortogram bilateral lower extremity runoff possible intervention for next week. Risks benefits possible, occasions and procedure details were discussed with the patient today. He understands and agrees to proceed.  Ruta Hinds, MD [facility] Office: [phone] Pager: [phone]

## 2014-11-28 ENCOUNTER — Other Ambulatory Visit: Payer: Self-pay

## 2014-12-05 ENCOUNTER — Telehealth: Payer: Self-pay | Admitting: Vascular Surgery

## 2014-12-05 ENCOUNTER — Ambulatory Visit (HOSPITAL_COMMUNITY)
Admission: RE | Admit: 2014-12-05 | Discharge: 2014-12-05 | Disposition: A | Discharge status: Home / Self Care | Payer: Medicare Other | Source: Ambulatory Visit | Attending: Vascular Surgery | Admitting: Vascular Surgery

## 2014-12-05 ENCOUNTER — Other Ambulatory Visit: Payer: Self-pay | Admitting: *Deleted

## 2014-12-05 ENCOUNTER — Telehealth: Payer: Self-pay | Admitting: Cardiology

## 2014-12-05 ENCOUNTER — Encounter (HOSPITAL_COMMUNITY)
Admission: RE | Disposition: A | Discharge status: Home / Self Care | Payer: Medicare Other | Source: Ambulatory Visit | Attending: Vascular Surgery

## 2014-12-05 DIAGNOSIS — I70213 Atherosclerosis of native arteries of extremities with intermittent claudication, bilateral legs: Secondary | ICD-10-CM | POA: Diagnosis not present

## 2014-12-05 DIAGNOSIS — I2510 Atherosclerotic heart disease of native coronary artery without angina pectoris: Secondary | ICD-10-CM | POA: Diagnosis not present

## 2014-12-05 DIAGNOSIS — Z951 Presence of aortocoronary bypass graft: Secondary | ICD-10-CM

## 2014-12-05 DIAGNOSIS — Z01810 Encounter for preprocedural cardiovascular examination: Secondary | ICD-10-CM

## 2014-12-05 DIAGNOSIS — I739 Peripheral vascular disease, unspecified: Secondary | ICD-10-CM

## 2014-12-05 DIAGNOSIS — I252 Old myocardial infarction: Secondary | ICD-10-CM | POA: Diagnosis not present

## 2014-12-05 DIAGNOSIS — I509 Heart failure, unspecified: Secondary | ICD-10-CM | POA: Insufficient documentation

## 2014-12-05 DIAGNOSIS — Z87891 Personal history of nicotine dependence: Secondary | ICD-10-CM | POA: Insufficient documentation

## 2014-12-05 HISTORY — PX: PERIPHERAL VASCULAR CATHETERIZATION: SHX172C

## 2014-12-05 LAB — POCT I-STAT, CHEM 8
BUN: 33 mg/dL — AB (ref 6–20)
CHLORIDE: 97 mmol/L — AB (ref 101–111)
CREATININE: 1.1 mg/dL (ref 0.61–1.24)
Calcium, Ion: 1.16 mmol/L (ref 1.13–1.30)
Glucose, Bld: 128 mg/dL — ABNORMAL HIGH (ref 65–99)
HEMATOCRIT: 29 % — AB (ref 39.0–52.0)
Hemoglobin: 9.9 g/dL — ABNORMAL LOW (ref 13.0–17.0)
Potassium: 5.5 mmol/L — ABNORMAL HIGH (ref 3.5–5.1)
Sodium: 130 mmol/L — ABNORMAL LOW (ref 135–145)
TCO2: 26 mmol/L (ref 0–100)

## 2014-12-05 LAB — GLUCOSE, CAPILLARY: Glucose-Capillary: 115 mg/dL — ABNORMAL HIGH (ref 65–99)

## 2014-12-05 SURGERY — ABDOMINAL AORTOGRAM
Anesthesia: LOCAL

## 2014-12-05 MED ORDER — SODIUM CHLORIDE 0.9 % IV SOLN
INTRAVENOUS | Status: DC
  Administered 2014-12-05: 08:00:00 via INTRAVENOUS

## 2014-12-05 MED ORDER — SODIUM CHLORIDE 0.45 % IV SOLN
INTRAVENOUS | Status: DC
  Administered 2014-12-05: 13:00:00 via INTRAVENOUS

## 2014-12-05 MED ORDER — ALUM & MAG HYDROXIDE-SIMETH 200-200-20 MG/5ML PO SUSP
15.0000 mL | ORAL | Status: DC | PRN
  Filled 2014-12-05: qty 30

## 2014-12-05 MED ORDER — ONDANSETRON HCL 4 MG/2ML IJ SOLN: 4.0000 mg | Freq: Four times a day (QID) | INTRAMUSCULAR | Status: DC | PRN

## 2014-12-05 MED ORDER — ACETAMINOPHEN 325 MG PO TABS
325.0000 mg | ORAL_TABLET | ORAL | Status: DC | PRN
  Filled 2014-12-05: qty 2

## 2014-12-05 MED ORDER — HEPARIN (PORCINE) IN NACL 2-0.9 UNIT/ML-% IJ SOLN
INTRAMUSCULAR | Status: AC
  Filled 2014-12-05: qty 1000

## 2014-12-05 MED ORDER — LIDOCAINE HCL (PF) 1 % IJ SOLN
INTRAMUSCULAR | Status: DC | PRN
  Administered 2014-12-05: 9 mL

## 2014-12-05 MED ORDER — MORPHINE SULFATE (PF) 10 MG/ML IV SOLN: 2.0000 mg | INTRAVENOUS | Status: DC | PRN

## 2014-12-05 MED ORDER — HYDRALAZINE HCL 20 MG/ML IJ SOLN: 5.0000 mg | INTRAMUSCULAR | Status: DC | PRN

## 2014-12-05 MED ORDER — METOPROLOL TARTRATE 1 MG/ML IV SOLN
2.0000 mg | INTRAVENOUS | Status: DC | PRN
Start: 2014-12-05 — End: 2014-12-05

## 2014-12-05 MED ORDER — ACETAMINOPHEN 325 MG RE SUPP
325.0000 mg | RECTAL | Status: DC | PRN
  Filled 2014-12-05: qty 2

## 2014-12-05 MED ORDER — LIDOCAINE HCL (PF) 1 % IJ SOLN
INTRAMUSCULAR | Status: AC
  Filled 2014-12-05: qty 30

## 2014-12-05 MED ORDER — LABETALOL HCL 5 MG/ML IV SOLN: 10.0000 mg | INTRAVENOUS | Status: DC | PRN

## 2014-12-05 SURGICAL SUPPLY — 12 items
CATH ANGIO 5F PIGTAIL 65CM (CATHETERS) ×1 IMPLANT
COVER PRB 48X5XTLSCP FOLD TPE (BAG) IMPLANT
COVER PROBE 5X48 (BAG) ×2
GLIDEWIRE ADV .035X180CM (WIRE) ×1 IMPLANT
KIT MICROINTRODUCER STIFF 5F (SHEATH) ×1 IMPLANT
KIT PV (KITS) ×2 IMPLANT
SHEATH PINNACLE 5F 10CM (SHEATH) ×1 IMPLANT
SYR MEDRAD MARK V 150ML (SYRINGE) ×2 IMPLANT
TRANSDUCER W/STOPCOCK (MISCELLANEOUS) ×2 IMPLANT
TRAY PV CATH (CUSTOM PROCEDURE TRAY) ×2 IMPLANT
WIRE HI TORQ VERSACORE-J 145CM (WIRE) ×1 IMPLANT
WIRE MICROPUNCTURE .018X50CM (WIRE) ×1 IMPLANT

## 2014-12-05 NOTE — Discharge Instructions (Signed)

## 2014-12-05 NOTE — Telephone Encounter (Signed)
Mr. Aston needs a lexiscan Myoview prior to vascular surgery for clearance. Last evaluation in 2010. Please schedule. If stable then we can send surgical clearance to Dr. Oneida Alar.  Peter Martinique MD, [facility]

## 2014-12-05 NOTE — Telephone Encounter (Signed)
-----   Message from Mena Goes, RN sent at 12/05/2014 12:05 PM EDT ----- Regarding: schedule Cardiac clearance and surgery    ----- Message -----    From: Elam Dutch, MD    Sent: 12/05/2014  10:12 AM      To: [facility] Charge Pool  [place] groin Aortogram with bilat runoff  Pt needs cardiac eval.  Has seen Martinique in the past.  After cardiac eval he needs bilateral femoral endarterectomy and fem fem bypass  Ruta Hinds

## 2014-12-05 NOTE — H&P (View-Only) (Signed)
Referred by:  Lona Kettle, MD [place], [place] [address]  Reason for referral: claudication  History of Present Illness  [name] is a 74 y.o. (06/26/1940) male who presents with chief complaint: left hip pain. Onset of symptom occurred 1 year ago. This pain occurs after walking one block. Sometimes he is able to walk two blocks before his hip hurts. He never has pain at rest. Rest relieves the pain. This pain limits him from walking and working in the yard. He used to be more active and walk 30 mins daily. He denies any rest pain or non healing wounds. He denies any symptoms in his right leg. The patient reports an accident where he fell onto his left hip 4 years ago from a 10 foot ladder. He believes this is unassociated with his current pain because he was asymptomatic for three years following the accident. Atherosclerotic risk factors include: history of CAD s/p redo CABG in 2005, hypertension, hyperlipidemia, diabetes mellitus and former smoker.   His current medical problems are stable.   His past surgical history includes recent exploratory laparotomy for small bowel obstruction (May 2015).   He denies any chest pain or shortness of breath. Full ROS below.   Past Medical History  Diagnosis Date  . CAD (coronary artery disease)   . History of acute inferior wall MI   . Diabetes mellitus   . HTN (hypertension)   . Dyslipidemia   . 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 BPH (benign prostatic hyperplasia)   . 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 Hyponatremia   . Acute on chronic diastolic CHF (congestive heart failure), NYHA class 1 08/09/2014    Past Surgical History  Procedure Laterality Date  . Coronary artery bypass graft  redo 2005    free Rima to OM, svg-diag,svg-pda  . Tonsillectomy    . Rotator cuff repair    . Melanoma excision    . Eye surgery      BILATERAL   . Kyphoplasty  02/29/2012    Procedure: KYPHOPLASTY;  Surgeon: Sinclair Ship, MD;  Location: [facility];  Service: Orthopedics;  Laterality: Bilateral;  T 10 kyphoplasty  . Laparotomy N/A 08/02/2014    Procedure: EXPLORATORY LAPAROTOMY LYSIS OF ADHESIONS;  Surgeon: Donnie Mesa, MD;  Location: [facility];  Service: General;  Laterality: N/A;    Social History   Social History  . Marital Status: Married    Spouse Name: N/A  . Number of Children: 2  . Years of Education: N/A   Occupational History  . insurance    Social History Main Topics  . Smoking status: Former Smoker    Quit date: 02/22/1986  . Smokeless tobacco: Not on file  . Alcohol Use: No  . Drug Use: No  . Sexual Activity: Not on file   Other Topics Concern  . Not on file   Social History Narrative    Family History  Problem Relation Age of Onset  . Dementia Mother     Current Outpatient Prescriptions on File Prior to Visit  Medication Sig Dispense Refill  . ALPRAZolam (XANAX) 0.5 MG tablet Take 0.5 mg by mouth 3 (three) times daily as needed for anxiety.     Marland Kitchen amLODipine (NORVASC) 5 MG tablet Take 1 tablet (5 mg total) by mouth daily. 90 tablet 1  . aspirin 81 MG tablet Take 81 mg by mouth daily.      . carvedilol (  COREG) 12.5 MG tablet TAKE 1 TABLET TWICE A DAY 180 tablet 2  . clopidogrel (PLAVIX) 75 MG tablet Take 1 tablet (75 mg total) by mouth daily. 90 tablet 2  . glimepiride (AMARYL) 4 MG tablet Take 4 mg by mouth daily before breakfast.     . losartan (COZAAR) 100 MG tablet Take 100 mg by mouth daily.     . nitroGLYCERIN (NITROSTAT) 0.4 MG SL tablet Place 1 tablet (0.4 mg total) under the tongue every 5 (five) minutes as needed for chest pain. 25 tablet 3  . pioglitazone (ACTOS) 45 MG tablet Take 45 mg by mouth daily.     . simvastatin (ZOCOR) 20 MG tablet Take 1 tablet (20 mg total) by mouth at bedtime. 90 tablet 3   No current facility-administered medications on file prior to visit.    Allergies  Allergen Reactions  . Hctz [Hydrochlorothiazide] Other (See Comments)     Hyponatremia     REVIEW OF SYSTEMS:  (Positives checked otherwise negative)  CARDIOVASCULAR:  [ ]  chest pain, [ ]  chest pressure, [ ]  palpitations, [ ]  shortness of breath when laying flat, [ ]  shortness of breath with exertion,   [s ] pain in legs when walking, [ ]  pain in feet when laying flat, [ ]  history of blood clot in veins (DVT), [ ]  history of phlebitis, [ ]  swelling in legs, [ ]  varicose veins  PULMONARY:  [ ]  productive cough, [ ]  asthma, [ ]  wheezing  NEUROLOGIC:  [s ] weakness in legs, [ ]  numbness in arms or legs, [ ]  difficulty speaking or slurred speech, [ ]  temporary loss of vision in one eye, [ ]  dizziness  HEMATOLOGIC:  [x]  bleeding problems, [ ]  problems with blood clotting too easily  MUSCULOSKEL:  [ ]  joint pain, [ ]  joint swelling  GASTROINTEST:  [ ]   Vomiting blood, [ ]   Blood in stool     GENITOURINARY:  [ ]   Burning with urination, [ ]   Blood in urine  PSYCHIATRIC:  [ ]  history of major depression  INTEGUMENTARY:  [ ]  rashes, [ ]  ulcers  CONSTITUTIONAL:  [ ]  fever, [ ]  chills  For VQI Use Only  PRE-ADM LIVING: Home  AMB STATUS: Ambulatory  CAD Sx: History of MI, but no symptoms No MI within 6 months  PRIOR CHF: Mild, iatrogenic secondary to volume resuscitation with SBO.   STRESS TEST: [ ]  No, [ ]  Normal, [ ]  + ischemia, [ ]  + MI, [ ]  Both    Physical Examination Filed Vitals:   11/27/14 1255 11/27/14 1259  BP: 147/74 143/73  Pulse: 60 59  Temp: 98.1 F (36.7 C)   TempSrc: Oral   Resp: 12   Height: 5\' 9"  (1.753 m)   Weight: 176 lb (79.833 kg)   SpO2: 100%    Body mass index is 25.98 kg/(m^2).  General: A&O x 3, WDWN appears stated age  Head: [place]/AT  Neck: Supple  Pulmonary: Sym exp, good air movt, CTAB, no rales, rhonchi  Cardiac: RRR, Nl S1, S2, no Murmurs, rubs or gallops, no carotid bruits.   Vascular: 1+ right femoral pulse, non-palpable left femoral pulse. Non palpable popliteal and pedal pulses bilaterally    Gastrointestinal: soft, NTND, no masses  Musculoskeletal: M/S 5/5 throughot, Extremities without ischemic changes  Neurologic: No focal deficits. Motor and sensory intact.   Psychiatric: Judgment intact, Mood & affect appropriatefor pt's clinical situation  Dermatologic: See M/S exam for extremity exam, no rashes  otherwise noted   Non-Invasive Vascular Imaging  ABI (Date: 11/27/2014)  R: 0.66, DP: monophasic, PT: monophasic, TBI: 0.46  L: non-compressible vessels, DP: monophasic, PT: monophasic, TBI: n/a  Outside Studies/Documentation 4 pages of outside documents were reviewed including: PCP.  Medical Decision Making  DELRICO MINEHART is a 74 y.o. male who presents peripheral vascular disease with lifestyle limiting intermittent left hip claudication. .   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   He will need an aortogram with bilateral runoff with possible intervention. This will be scheduled at the patient's convenience.  He is currently on maximal medical management with ASA, a statin and plavix. He is a former smoker.   Virgina Jock, PA-C [facility] Office: [phone]  11/27/2014, 1:29 PM  This patient was seen and examined in conjunction with Dr. Oneida Alar.    History and exam findings as above. Patient with left hip pain which may be related to iliac artery occlusive disease. He also has evidence of lower extremity arterial occlusive disease on the right leg but not really any symptoms on that side. We have scheduled him for aortogram bilateral lower extremity runoff possible intervention for next week. Risks benefits possible, occasions and procedure details were discussed with the patient today. He understands and agrees to proceed.  Ruta Hinds, MD [facility] Office: [phone] Pager: [phone]

## 2014-12-05 NOTE — Interval H&P Note (Signed)
History and Physical Interval Note:  12/05/2014 8:55 AM  [name]  has presented today for surgery, with the diagnosis of pvd with LLE claudication  The various methods of treatment have been discussed with the patient and family. After consideration of risks, benefits and other options for treatment, the patient has consented to  Procedure(s): Abdominal Aortogram (N/A) as a surgical intervention .  The patient's history has been reviewed, patient examined, no change in status, stable for surgery.  I have reviewed the patient's chart and labs.  Questions were answered to the patient's satisfaction.     Ruta Hinds

## 2014-12-05 NOTE — Telephone Encounter (Signed)
Left detailed message on patients home # regarding appt with Dr Martinique on 09/15 @ [place] location. dpm

## 2014-12-05 NOTE — Progress Notes (Signed)
Site area: rt groin Site Prior to Removal:  Level 0 Pressure Applied For:  20 minutes Manual:   yes Patient Status During Pull:  stable Post Pull Site:  Level  0 Post Pull Instructions Given:  yes Post Pull Pulses Present: yes Dressing Applied:  tegaderm Bedrest begins @   1055   Comments:  0

## 2014-12-05 NOTE — Telephone Encounter (Signed)
Request for surgical clearance:  1. What type of surgery is being performed?  lower extremitdy bypass  2. When is this surgery scheduled? 1st week in October pending surgical clearance  3. Are there any medications that need to be held prior to surgery and how long? unknown   4. Name of physician performing surgery? Dr. Oneida Alar  5. What is your office phone and fax number? Fax number [phone]  Attention clinical staff  Phone [phone]

## 2014-12-08 ENCOUNTER — Telehealth: Payer: Self-pay | Admitting: Cardiology

## 2014-12-08 ENCOUNTER — Other Ambulatory Visit: Payer: Self-pay

## 2014-12-08 ENCOUNTER — Encounter (HOSPITAL_COMMUNITY): Payer: Self-pay | Admitting: Vascular Surgery

## 2014-12-08 MED FILL — Heparin Sodium (Porcine) 2 Unit/ML in Sodium Chloride 0.9%: INTRAMUSCULAR | Qty: 500 | Status: AC

## 2014-12-08 NOTE — Telephone Encounter (Signed)
Returned call to [place] with [facility].Left message on personal voice mail Dr.Jordan advised patient will need lexiscan myoview for clearance.Patient called advised schedulers will call you to schedule myoview.Advised to keep appointment with Dr.Jordan 12/11/14 at 3:00 pm.

## 2014-12-08 NOTE — Addendum Note (Signed)
Addended by: Golden Hurter D on: 12/08/2014 01:30 PM   Modules accepted: Orders

## 2014-12-08 NOTE — Telephone Encounter (Signed)
[name] is calling in stating that the pt will be having bypass surgery on 10/5 and she said that he will be having his last dosage of Plavix on 9/29. She wanted to see if this is ok with Dr. Martinique . Please f/u with her   Thanks

## 2014-12-09 NOTE — Telephone Encounter (Signed)
Returned call to [place] with [facility].Left message on personal voice mail pt has appointment with Dr.Jordan 12/11/14 and he is scheduled for [place] 12/16/14.I spoke to Dr.Jordan he advised patient can hold plavix on 12/25/14.

## 2014-12-11 ENCOUNTER — Telehealth (HOSPITAL_COMMUNITY): Payer: Self-pay | Admitting: *Deleted

## 2014-12-11 ENCOUNTER — Ambulatory Visit (INDEPENDENT_AMBULATORY_CARE_PROVIDER_SITE_OTHER): Payer: Medicare Other | Admitting: Cardiology

## 2014-12-11 ENCOUNTER — Encounter: Payer: Self-pay | Admitting: Cardiology

## 2014-12-11 VITALS — BP 132/64 | HR 72 | Ht 69.0 in | Wt 175.9 lb

## 2014-12-11 DIAGNOSIS — Z951 Presence of aortocoronary bypass graft: Secondary | ICD-10-CM

## 2014-12-11 DIAGNOSIS — E785 Hyperlipidemia, unspecified: Secondary | ICD-10-CM | POA: Diagnosis not present

## 2014-12-11 DIAGNOSIS — I739 Peripheral vascular disease, unspecified: Secondary | ICD-10-CM

## 2014-12-11 DIAGNOSIS — I10 Essential (primary) hypertension: Secondary | ICD-10-CM

## 2014-12-11 DIAGNOSIS — E1151 Type 2 diabetes mellitus with diabetic peripheral angiopathy without gangrene: Secondary | ICD-10-CM

## 2014-12-11 DIAGNOSIS — I25810 Atherosclerosis of coronary artery bypass graft(s) without angina pectoris: Secondary | ICD-10-CM

## 2014-12-11 NOTE — Telephone Encounter (Signed)
Left message on voicemail in reference to upcoming appointment scheduled for 12/16/14. Phone number given for a call back so details instructions can be given. Lark Runk J Malarie Tappen, RN 

## 2014-12-11 NOTE — Progress Notes (Signed)
[name] Date of Birth: [birth year] Medical Record #[id number]  History of Present Illness: [name] is seen for pre op cardiac evaluation.  He has a history of coronary disease and is status post redo coronary bypass surgery in 2005 after emergent stenting of the left main. This included a free RIMA graft to the OM, SVG to diagonal, and SVG to PDA. LIMA to LAD was intact from original surgery. He has had an old anterior myocardial infarction.  In April 2016 he was admitted with SBO and underwent surgery with exploratory lap and lysis of adhesions. His post op course was complicated by marked volume overload due to IVF with over 20 lb weight gain. He was diuresed. Echo showed EF 45%.  More recently he presented with progressive claudication in the left hip and leg. Dopplers showed severe PAD. Angiography was done by Dr. Oneida Alar and showed severe left iliac and bilateral common femoral disease. Surgery was recommended with bilateral femoral endarterectomy and fem- fem BPG. From a cardiac standpoint he is doing well.  No chest pain or dyspnea. Edema has resolved.  Bowels are working [employer]. No Nausea or abdominal pain.    Current Outpatient Prescriptions on File Prior to Visit  Medication Sig Dispense Refill  . ALPRAZolam (XANAX) 0.5 MG tablet Take 0.5 mg by mouth 3 (three) times daily as needed for anxiety.     Marland Kitchen amLODipine (NORVASC) 5 MG tablet Take 1 tablet (5 mg total) by mouth daily. 90 tablet 1  . aspirin 81 MG tablet Take 81 mg by mouth daily.      . carvedilol (COREG) 12.5 MG tablet TAKE 1 TABLET TWICE A DAY 180 tablet 2  . clopidogrel (PLAVIX) 75 MG tablet Take 1 tablet (75 mg total) by mouth daily. 90 tablet 2  . glimepiride (AMARYL) 4 MG tablet Take 4 mg by mouth daily before breakfast.     . iron polysaccharides (NIFEREX) 150 MG capsule Take 150 mg by mouth daily.    Marland Kitchen losartan (COZAAR) 100 MG tablet Take 100 mg by mouth daily.     . nitroGLYCERIN (NITROSTAT) 0.4 MG SL tablet Place 1  tablet (0.4 mg total) under the tongue every 5 (five) minutes as needed for chest pain. 25 tablet 3  . pioglitazone (ACTOS) 45 MG tablet Take 45 mg by mouth daily.     . simvastatin (ZOCOR) 20 MG tablet Take 1 tablet (20 mg total) by mouth at bedtime. 90 tablet 3   No current facility-administered medications on file prior to visit.    Allergies  Allergen Reactions  . Hctz [Hydrochlorothiazide] Other (See Comments)    Hyponatremia Low potassium     Past Medical History  Diagnosis Date  . CAD (coronary artery disease)   . History of acute inferior wall MI   . Diabetes mellitus   . HTN (hypertension)   . Dyslipidemia   . 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 BPH (benign prostatic hyperplasia)   . 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 Hyponatremia   . Acute on chronic diastolic CHF (congestive heart failure), NYHA class 1 08/09/2014  . PAD (peripheral artery disease)     Past Surgical History  Procedure Laterality Date  . Coronary artery bypass graft  redo 2005    free Rima to OM, svg-diag,svg-pda  . Tonsillectomy    . Rotator cuff repair    . Melanoma excision    . Eye surgery      BILATERAL   .  Kyphoplasty  02/29/2012    Procedure: KYPHOPLASTY;  Surgeon: Sinclair Ship, MD;  Location: [facility];  Service: Orthopedics;  Laterality: Bilateral;  T 10 kyphoplasty  . Laparotomy N/A 08/02/2014    Procedure: EXPLORATORY LAPAROTOMY LYSIS OF ADHESIONS;  Surgeon: Donnie Mesa, MD;  Location: [facility];  Service: General;  Laterality: N/A;  . Peripheral vascular catheterization N/A 12/05/2014    Procedure: Abdominal Aortogram;  Surgeon: Elam Dutch, MD;  Location: [facility];  Service: Cardiovascular;  Laterality: N/A;    History  Smoking status  . Former Smoker  . Quit date: 02/22/1986  Smokeless tobacco  . Not on file    History  Alcohol Use No    Family History  Problem Relation Age of Onset  . Dementia Mother     Review of Systems: As noted in history of  present illness.  All other systems were reviewed and are negative.  Physical Exam: BP 132/64 mmHg  Pulse 72  Ht 5\' 9"  (1.753 m)  Wt 79.788 kg (175 lb 14.4 oz)  BMI 25.96 kg/m2 He is a pleasant white male in no acute distress.The patient is alert and oriented x 3.    The skin is warm and dry.  Color is normal.  The HEENT exam is unremarkable. Sclera are clear. PERRLA. The mucous membranes are moist.  The carotids are 2+ without bruits.  There is no thyromegaly.  There is no JVD.  The lungs are clear.   The heart exam reveals a regular rate with a normal S1 and S2.  There is a grade 0-9/7 systolic ejection murmur the left sternal border.  The PMI is not displaced.   Abdominal exam is nontender. Bowel sounds are positive. There is no hepatosplenomegaly or tenderness.  There are no masses.  Exam of the legs reveal noedema.    The distal pulses are poor.  Cranial nerves II - XII are intact.  Motor and sensory functions are intact.  The gait is normal.  LABORATORY DATA: Lab Results  Component Value Date   WBC 7.2 08/10/2014   HGB 9.9* 12/05/2014   HCT 29.0* 12/05/2014   PLT 230 08/10/2014   GLUCOSE 128* 12/05/2014   TRIG 44 08/07/2014   ALT 45 08/07/2014   AST 53* 08/07/2014   NA 130* 12/05/2014   K 5.5* 12/05/2014   CL 97* 12/05/2014   CREATININE 1.10 12/05/2014   BUN 33* 12/05/2014   CO2 30 08/10/2014   INR 1.05 02/17/2012   HGBA1C 6.4* 07/27/2014     Assessment / Plan: 1. Coronary disease status post redo CABG in 2005. Remote anterior myocardial infarction. Prior left main stent. Clinically doing very well. Continue his current medical therapy. Will obtain Lexiscan myoview next Thursday. Prior myoview in 2010 showed inferior scar with mild peri-infarct ischemia and EF 53%. If scan is relatively low risk will clear for planned PV surgery. If high risk may need to consider cardiac cath.   2. Acute diastolic CHF secondary to volume resuscitation with SBO in April. Now back to baseline.    3. Hypertension,  well controlled  4. Hyperlipidemia on Zocor.  5. Severe PAD with limiting claudication.

## 2014-12-11 NOTE — Telephone Encounter (Signed)
Patient given detailed instructions per Myocardial Perfusion Study Information Sheet for test on 12/16/14 at 1215. Patient notified to arrive 15 minutes early and that it is imperative to arrive on time for appointment to keep from having the test rescheduled.  If you need to cancel or reschedule your appointment, please call the office within 24 hours of your appointment. Failure to do so may result in a cancellation of your appointment, and a $50 no show fee. Patient verbalized understanding. Hubbard Robinson, RN

## 2014-12-15 ENCOUNTER — Encounter (HOSPITAL_COMMUNITY): Payer: Medicare Other

## 2014-12-16 ENCOUNTER — Ambulatory Visit (HOSPITAL_COMMUNITY): Payer: Medicare Other | Attending: Cardiovascular Disease

## 2014-12-16 DIAGNOSIS — Z951 Presence of aortocoronary bypass graft: Secondary | ICD-10-CM

## 2014-12-16 DIAGNOSIS — I10 Essential (primary) hypertension: Secondary | ICD-10-CM | POA: Insufficient documentation

## 2014-12-16 DIAGNOSIS — E785 Hyperlipidemia, unspecified: Secondary | ICD-10-CM | POA: Insufficient documentation

## 2014-12-16 DIAGNOSIS — R9439 Abnormal result of other cardiovascular function study: Secondary | ICD-10-CM | POA: Diagnosis not present

## 2014-12-16 DIAGNOSIS — E119 Type 2 diabetes mellitus without complications: Secondary | ICD-10-CM | POA: Insufficient documentation

## 2014-12-16 LAB — MYOCARDIAL PERFUSION IMAGING
CHL CUP NUCLEAR SSS: 15
CSEPPHR: 74 {beats}/min
LHR: 0.37
LV dias vol: 145 mL
LVSYSVOL: 71 mL
Rest HR: 63 {beats}/min
SDS: 6
SRS: 11
TID: 1.01

## 2014-12-16 MED ORDER — TECHNETIUM TC 99M SESTAMIBI GENERIC - CARDIOLITE
10.9000 | Freq: Once | INTRAVENOUS | Status: AC | PRN
  Administered 2014-12-16: 10.9 via INTRAVENOUS

## 2014-12-16 MED ORDER — TECHNETIUM TC 99M SESTAMIBI GENERIC - CARDIOLITE
30.8000 | Freq: Once | INTRAVENOUS | Status: AC | PRN
  Administered 2014-12-16: 30.8 via INTRAVENOUS

## 2014-12-16 MED ORDER — REGADENOSON 0.4 MG/5ML IV SOLN
0.4000 mg | Freq: Once | INTRAVENOUS | Status: AC
  Administered 2014-12-16: 0.4 mg via INTRAVENOUS

## 2014-12-17 ENCOUNTER — Telehealth: Payer: Self-pay | Admitting: Cardiology

## 2014-12-17 ENCOUNTER — Other Ambulatory Visit: Payer: Self-pay | Admitting: *Deleted

## 2014-12-17 DIAGNOSIS — I5031 Acute diastolic (congestive) heart failure: Secondary | ICD-10-CM

## 2014-12-17 DIAGNOSIS — R9439 Abnormal result of other cardiovascular function study: Secondary | ICD-10-CM

## 2014-12-17 MED ORDER — AMLODIPINE BESYLATE 5 MG PO TABS: 5.0000 mg | ORAL_TABLET | Freq: Every day | ORAL | Status: DC

## 2014-12-17 NOTE — Telephone Encounter (Signed)
Returned call to patient.Advised Dr.Jordan out of office this week.12/16/14 myoview abnormal.Stated he is scheduled for left femoral bypass surgery 12/31/14 with Dr.Fields.Also stated he has planned a trip to [place] tomorrow.Stated he feels good no chest pain.No sob. Advised I will speak to DOD Dr.Berry.  Spoke to DOD Dr.Berry he advised patient will need appointment to see Dr.Jordan to schedule cardiac cath.Advised ok for him to travel to [place].   Returned call to patient he stated he would like to go ahead and schedule cath since his surgery already scheduled for 12/31/14.Left cardiac cath scheduled Tue 12/23/14 at 1:00 pm with Dr.Jordan.Patient will have pre cath lab and pick up cath instructions tomorrow 12/18/14.Stated he will go to [place] after he has lab work.

## 2014-12-17 NOTE — Telephone Encounter (Signed)
Pt called in wanting the results to his stress test that was done 9/20. Please call  Thanks

## 2014-12-18 LAB — BASIC METABOLIC PANEL
BUN: 19 mg/dL (ref 7–25)
CALCIUM: 9.2 mg/dL (ref 8.6–10.3)
CO2: 26 mmol/L (ref 20–31)
Chloride: 100 mmol/L (ref 98–110)
Creat: 1.02 mg/dL (ref 0.70–1.18)
GLUCOSE: 117 mg/dL — AB (ref 65–99)
POTASSIUM: 4.3 mmol/L (ref 3.5–5.3)
SODIUM: 135 mmol/L (ref 135–146)

## 2014-12-18 LAB — CBC WITH DIFFERENTIAL/PLATELET
BASOS ABS: 0 10*3/uL (ref 0.0–0.1)
BASOS PCT: 0 % (ref 0–1)
Eosinophils Absolute: 0.1 10*3/uL (ref 0.0–0.7)
Eosinophils Relative: 1 % (ref 0–5)
HEMATOCRIT: 26.2 % — AB (ref 39.0–52.0)
HEMOGLOBIN: 9.2 g/dL — AB (ref 13.0–17.0)
LYMPHS PCT: 15 % (ref 12–46)
Lymphs Abs: 1 10*3/uL (ref 0.7–4.0)
MCH: 32.1 pg (ref 26.0–34.0)
MCHC: 35.1 g/dL (ref 30.0–36.0)
MCV: 91.3 fL (ref 78.0–100.0)
MONO ABS: 0.8 10*3/uL (ref 0.1–1.0)
MPV: 8.8 fL (ref 8.6–12.4)
Monocytes Relative: 12 % (ref 3–12)
NEUTROS ABS: 4.6 10*3/uL (ref 1.7–7.7)
Neutrophils Relative %: 72 % (ref 43–77)
Platelets: 144 10*3/uL — ABNORMAL LOW (ref 150–400)
RBC: 2.87 MIL/uL — AB (ref 4.22–5.81)
RDW: 14.6 % (ref 11.5–15.5)
WBC: 6.4 10*3/uL (ref 4.0–10.5)

## 2014-12-19 LAB — PROTIME-INR
INR: 1.21 (ref <1.50)
PROTHROMBIN TIME: 15.5 s — AB (ref 11.6–15.2)

## 2014-12-20 ENCOUNTER — Other Ambulatory Visit: Payer: Self-pay | Admitting: Cardiology

## 2014-12-23 ENCOUNTER — Ambulatory Visit (HOSPITAL_COMMUNITY)
Admission: RE | Admit: 2014-12-23 | Discharge: 2014-12-23 | Disposition: A | Discharge status: Home / Self Care | Payer: Medicare Other | Source: Ambulatory Visit | Attending: Cardiology | Admitting: Cardiology

## 2014-12-23 ENCOUNTER — Encounter (HOSPITAL_COMMUNITY)
Admission: RE | Disposition: A | Discharge status: Home / Self Care | Payer: Self-pay | Source: Ambulatory Visit | Attending: Cardiology

## 2014-12-23 ENCOUNTER — Other Ambulatory Visit: Payer: Self-pay

## 2014-12-23 DIAGNOSIS — I2510 Atherosclerotic heart disease of native coronary artery without angina pectoris: Secondary | ICD-10-CM | POA: Diagnosis present

## 2014-12-23 DIAGNOSIS — I5033 Acute on chronic diastolic (congestive) heart failure: Secondary | ICD-10-CM | POA: Insufficient documentation

## 2014-12-23 DIAGNOSIS — N400 Enlarged prostate without lower urinary tract symptoms: Secondary | ICD-10-CM | POA: Diagnosis not present

## 2014-12-23 DIAGNOSIS — Z955 Presence of coronary angioplasty implant and graft: Secondary | ICD-10-CM | POA: Diagnosis not present

## 2014-12-23 DIAGNOSIS — Z951 Presence of aortocoronary bypass graft: Secondary | ICD-10-CM | POA: Diagnosis not present

## 2014-12-23 DIAGNOSIS — I252 Old myocardial infarction: Secondary | ICD-10-CM | POA: Diagnosis not present

## 2014-12-23 DIAGNOSIS — E785 Hyperlipidemia, unspecified: Secondary | ICD-10-CM | POA: Diagnosis not present

## 2014-12-23 DIAGNOSIS — Z7902 Long term (current) use of antithrombotics/antiplatelets: Secondary | ICD-10-CM | POA: Diagnosis not present

## 2014-12-23 DIAGNOSIS — Z85820 Personal history of malignant melanoma of skin: Secondary | ICD-10-CM | POA: Diagnosis not present

## 2014-12-23 DIAGNOSIS — I10 Essential (primary) hypertension: Secondary | ICD-10-CM | POA: Diagnosis not present

## 2014-12-23 DIAGNOSIS — I739 Peripheral vascular disease, unspecified: Secondary | ICD-10-CM | POA: Diagnosis not present

## 2014-12-23 DIAGNOSIS — R9439 Abnormal result of other cardiovascular function study: Secondary | ICD-10-CM | POA: Diagnosis present

## 2014-12-23 DIAGNOSIS — Z87891 Personal history of nicotine dependence: Secondary | ICD-10-CM | POA: Insufficient documentation

## 2014-12-23 DIAGNOSIS — I2582 Chronic total occlusion of coronary artery: Secondary | ICD-10-CM | POA: Diagnosis not present

## 2014-12-23 DIAGNOSIS — E1151 Type 2 diabetes mellitus with diabetic peripheral angiopathy without gangrene: Secondary | ICD-10-CM | POA: Insufficient documentation

## 2014-12-23 DIAGNOSIS — Z7982 Long term (current) use of aspirin: Secondary | ICD-10-CM | POA: Diagnosis not present

## 2014-12-23 HISTORY — PX: CARDIAC CATHETERIZATION: SHX172

## 2014-12-23 LAB — GLUCOSE, CAPILLARY
GLUCOSE-CAPILLARY: 62 mg/dL — AB (ref 65–99)
Glucose-Capillary: 110 mg/dL — ABNORMAL HIGH (ref 65–99)

## 2014-12-23 SURGERY — LEFT HEART CATH AND CORS/GRAFTS ANGIOGRAPHY

## 2014-12-23 MED ORDER — HEPARIN SODIUM (PORCINE) 1000 UNIT/ML IJ SOLN
INTRAMUSCULAR | Status: DC | PRN
  Administered 2014-12-23: 4000 [IU] via INTRAVENOUS

## 2014-12-23 MED ORDER — ASPIRIN 81 MG PO CHEW: 81.0000 mg | CHEWABLE_TABLET | ORAL | Status: DC

## 2014-12-23 MED ORDER — MIDAZOLAM HCL 2 MG/2ML IJ SOLN
INTRAMUSCULAR | Status: AC
  Filled 2014-12-23: qty 4

## 2014-12-23 MED ORDER — SODIUM CHLORIDE 0.9 % IJ SOLN: 3.0000 mL | INTRAMUSCULAR | Status: DC | PRN

## 2014-12-23 MED ORDER — SODIUM CHLORIDE 0.9 % WEIGHT BASED INFUSION: 1.0000 mL/kg/h | INTRAVENOUS | Status: DC

## 2014-12-23 MED ORDER — MIDAZOLAM HCL 2 MG/2ML IJ SOLN
INTRAMUSCULAR | Status: DC | PRN
  Administered 2014-12-23: 1 mg via INTRAVENOUS

## 2014-12-23 MED ORDER — VERAPAMIL HCL 2.5 MG/ML IV SOLN
INTRAVENOUS | Status: DC | PRN
  Administered 2014-12-23: 16:00:00 via INTRA_ARTERIAL

## 2014-12-23 MED ORDER — IOHEXOL 350 MG/ML SOLN
INTRAVENOUS | Status: DC | PRN
  Administered 2014-12-23: 90 mL via INTRA_ARTERIAL

## 2014-12-23 MED ORDER — NITROGLYCERIN 1 MG/10 ML FOR IR/CATH LAB
INTRA_ARTERIAL | Status: AC
  Filled 2014-12-23: qty 10

## 2014-12-23 MED ORDER — SODIUM CHLORIDE 0.9 % IV SOLN: 250.0000 mL | INTRAVENOUS | Status: DC | PRN

## 2014-12-23 MED ORDER — SODIUM CHLORIDE 0.9 % WEIGHT BASED INFUSION
1.0000 mL/kg/h | INTRAVENOUS | Status: DC
  Administered 2014-12-23: 1 mL/kg/h via INTRAVENOUS

## 2014-12-23 MED ORDER — DEXTROSE 50 % IV SOLN
INTRAVENOUS | Status: AC
  Administered 2014-12-23: 25 mL via INTRAVENOUS
  Filled 2014-12-23: qty 50

## 2014-12-23 MED ORDER — SODIUM CHLORIDE 0.9 % IJ SOLN: 3.0000 mL | Freq: Two times a day (BID) | INTRAMUSCULAR | Status: DC

## 2014-12-23 MED ORDER — DEXTROSE 50 % IV SOLN
25.0000 mL | Freq: Once | INTRAVENOUS | Status: AC
  Administered 2014-12-23: 25 mL via INTRAVENOUS

## 2014-12-23 MED ORDER — SODIUM CHLORIDE 0.9 % WEIGHT BASED INFUSION
3.0000 mL/kg/h | INTRAVENOUS | Status: DC
  Administered 2014-12-23: 3 mL/kg/h via INTRAVENOUS

## 2014-12-23 MED ORDER — LIDOCAINE HCL (PF) 1 % IJ SOLN
INTRAMUSCULAR | Status: AC
  Filled 2014-12-23: qty 30

## 2014-12-23 MED ORDER — HEPARIN SODIUM (PORCINE) 1000 UNIT/ML IJ SOLN
INTRAMUSCULAR | Status: AC
  Filled 2014-12-23: qty 1

## 2014-12-23 MED ORDER — HEPARIN (PORCINE) IN NACL 2-0.9 UNIT/ML-% IJ SOLN
INTRAMUSCULAR | Status: AC
  Filled 2014-12-23: qty 1000

## 2014-12-23 MED ORDER — VERAPAMIL HCL 2.5 MG/ML IV SOLN
INTRAVENOUS | Status: AC
  Filled 2014-12-23: qty 2

## 2014-12-23 MED ORDER — SODIUM CHLORIDE 0.9 % IV SOLN
INTRAVENOUS | Status: DC | PRN
  Administered 2014-12-23: 79 mL/h via INTRAVENOUS

## 2014-12-23 MED ORDER — FENTANYL CITRATE (PF) 100 MCG/2ML IJ SOLN
INTRAMUSCULAR | Status: AC
  Filled 2014-12-23: qty 4

## 2014-12-23 MED ORDER — LIDOCAINE HCL (PF) 1 % IJ SOLN
INTRAMUSCULAR | Status: DC | PRN
  Administered 2014-12-23: 17:00:00

## 2014-12-23 MED ORDER — FENTANYL CITRATE (PF) 100 MCG/2ML IJ SOLN
INTRAMUSCULAR | Status: DC | PRN
  Administered 2014-12-23: 25 ug via INTRAVENOUS

## 2014-12-23 SURGICAL SUPPLY — 14 items
CATH INFINITI 4FR 145 PIGTAIL (CATHETERS) ×2 IMPLANT
CATH INFINITI 4FR JL 4.0 (CATHETERS) ×2 IMPLANT
CATH INFINITI 5 FR JL3.5 (CATHETERS) ×1 IMPLANT
CATH INFINITI 5FR JL4 (CATHETERS) ×2 IMPLANT
CATH INFINITI JR4 5F (CATHETERS) ×3 IMPLANT
DEVICE RAD COMP TR BAND LRG (VASCULAR PRODUCTS) ×3 IMPLANT
GLIDESHEATH SLEND SS 6F .021 (SHEATH) ×3 IMPLANT
KIT HEART LEFT (KITS) ×3 IMPLANT
PACK CARDIAC CATHETERIZATION (CUSTOM PROCEDURE TRAY) ×3 IMPLANT
SYR MEDRAD MARK V 150ML (SYRINGE) ×1 IMPLANT
TRANSDUCER W/STOPCOCK (MISCELLANEOUS) ×3 IMPLANT
TUBING CIL FLEX 10 FLL-RA (TUBING) ×3 IMPLANT
WIRE HI TORQ VERSACORE-J 145CM (WIRE) ×2 IMPLANT
WIRE SAFE-T 1.5MM-J .035X260CM (WIRE) ×3 IMPLANT

## 2014-12-23 NOTE — H&P (View-Only) (Signed)
[name] Date of Birth: 1940-06-25 Medical Record #[id number]  History of Present Illness: [name] is seen for pre op cardiac evaluation.  He has a history of coronary disease and is status post redo coronary bypass surgery in 2005 after emergent stenting of the left main. This included a free RIMA graft to the OM, SVG to diagonal, and SVG to PDA. LIMA to LAD was intact from original surgery. He has had an old anterior myocardial infarction.  In April 2016 he was admitted with SBO and underwent surgery with exploratory lap and lysis of adhesions. His post op course was complicated by marked volume overload due to IVF with over 20 lb weight gain. He was diuresed. Echo showed EF 45%.  More recently he presented with progressive claudication in the left hip and leg. Dopplers showed severe PAD. Angiography was done by Dr. Oneida Alar and showed severe left iliac and bilateral common femoral disease. Surgery was recommended with bilateral femoral endarterectomy and fem- fem BPG. From a cardiac standpoint he is doing well.  No chest pain or dyspnea. Edema has resolved.  Bowels are working [employer]. No Nausea or abdominal pain.    Current Outpatient Prescriptions on File Prior to Visit  Medication Sig Dispense Refill  . ALPRAZolam (XANAX) 0.5 MG tablet Take 0.5 mg by mouth 3 (three) times daily as needed for anxiety.     Marland Kitchen amLODipine (NORVASC) 5 MG tablet Take 1 tablet (5 mg total) by mouth daily. 90 tablet 1  . aspirin 81 MG tablet Take 81 mg by mouth daily.      . carvedilol (COREG) 12.5 MG tablet TAKE 1 TABLET TWICE A DAY 180 tablet 2  . clopidogrel (PLAVIX) 75 MG tablet Take 1 tablet (75 mg total) by mouth daily. 90 tablet 2  . glimepiride (AMARYL) 4 MG tablet Take 4 mg by mouth daily before breakfast.     . iron polysaccharides (NIFEREX) 150 MG capsule Take 150 mg by mouth daily.    Marland Kitchen losartan (COZAAR) 100 MG tablet Take 100 mg by mouth daily.     . nitroGLYCERIN (NITROSTAT) 0.4 MG SL tablet Place 1  tablet (0.4 mg total) under the tongue every 5 (five) minutes as needed for chest pain. 25 tablet 3  . pioglitazone (ACTOS) 45 MG tablet Take 45 mg by mouth daily.     . simvastatin (ZOCOR) 20 MG tablet Take 1 tablet (20 mg total) by mouth at bedtime. 90 tablet 3   No current facility-administered medications on file prior to visit.    Allergies  Allergen Reactions  . Hctz [Hydrochlorothiazide] Other (See Comments)    Hyponatremia Low potassium     Past Medical History  Diagnosis Date  . CAD (coronary artery disease)   . History of acute inferior wall MI   . Diabetes mellitus   . HTN (hypertension)   . Dyslipidemia   . 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 BPH (benign prostatic hyperplasia)   . 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 Hyponatremia   . Acute on chronic diastolic CHF (congestive heart failure), NYHA class 1 08/09/2014  . PAD (peripheral artery disease)     Past Surgical History  Procedure Laterality Date  . Coronary artery bypass graft  redo 2005    free Rima to OM, svg-diag,svg-pda  . Tonsillectomy    . Rotator cuff repair    . Melanoma excision    . Eye surgery      BILATERAL   .  Kyphoplasty  02/29/2012    Procedure: KYPHOPLASTY;  Surgeon: Sinclair Ship, MD;  Location: [facility];  Service: Orthopedics;  Laterality: Bilateral;  T 10 kyphoplasty  . Laparotomy N/A 08/02/2014    Procedure: EXPLORATORY LAPAROTOMY LYSIS OF ADHESIONS;  Surgeon: Donnie Mesa, MD;  Location: [facility];  Service: General;  Laterality: N/A;  . Peripheral vascular catheterization N/A 12/05/2014    Procedure: Abdominal Aortogram;  Surgeon: Elam Dutch, MD;  Location: [facility];  Service: Cardiovascular;  Laterality: N/A;    History  Smoking status  . Former Smoker  . Quit date: 02/22/1986  Smokeless tobacco  . Not on file    History  Alcohol Use No    Family History  Problem Relation Age of Onset  . Dementia Mother     Review of Systems: As noted in history of  present illness.  All other systems were reviewed and are negative.  Physical Exam: BP 132/64 mmHg  Pulse 72  Ht 5\' 9"  (1.753 m)  Wt 79.788 kg (175 lb 14.4 oz)  BMI 25.96 kg/m2 He is a pleasant white male in no acute distress.The patient is alert and oriented x 3.    The skin is warm and dry.  Color is normal.  The HEENT exam is unremarkable. Sclera are clear. PERRLA. The mucous membranes are moist.  The carotids are 2+ without bruits.  There is no thyromegaly.  There is no JVD.  The lungs are clear.   The heart exam reveals a regular rate with a normal S1 and S2.  There is a grade 8-5/8 systolic ejection murmur the left sternal border.  The PMI is not displaced.   Abdominal exam is nontender. Bowel sounds are positive. There is no hepatosplenomegaly or tenderness.  There are no masses.  Exam of the legs reveal noedema.    The distal pulses are poor.  Cranial nerves II - XII are intact.  Motor and sensory functions are intact.  The gait is normal.  LABORATORY DATA: Lab Results  Component Value Date   WBC 7.2 08/10/2014   HGB 9.9* 12/05/2014   HCT 29.0* 12/05/2014   PLT 230 08/10/2014   GLUCOSE 128* 12/05/2014   TRIG 44 08/07/2014   ALT 45 08/07/2014   AST 53* 08/07/2014   NA 130* 12/05/2014   K 5.5* 12/05/2014   CL 97* 12/05/2014   CREATININE 1.10 12/05/2014   BUN 33* 12/05/2014   CO2 30 08/10/2014   INR 1.05 02/17/2012   HGBA1C 6.4* 07/27/2014     Assessment / Plan: 1. Coronary disease status post redo CABG in 2005. Remote anterior myocardial infarction. Prior left main stent. Clinically doing very well. Continue his current medical therapy. Will obtain Lexiscan myoview next Thursday. Prior myoview in 2010 showed inferior scar with mild peri-infarct ischemia and EF 53%. If scan is relatively low risk will clear for planned PV surgery. If high risk may need to consider cardiac cath.   2. Acute diastolic CHF secondary to volume resuscitation with SBO in April. Now back to baseline.    3. Hypertension,  well controlled  4. Hyperlipidemia on Zocor.  5. Severe PAD with limiting claudication.

## 2014-12-23 NOTE — Interval H&P Note (Signed)
History and Physical Interval Note:  12/23/2014 4:00 PM  [name]  has presented today for surgery, with the diagnosis of abnormal myoview  The various methods of treatment have been discussed with the patient and family. After consideration of risks, benefits and other options for treatment, the patient has consented to  Procedure(s): Left Heart Cath and Coronary Angiography (N/A) as a surgical intervention .  The patient's history has been reviewed, patient examined, no change in status, stable for surgery.  I have reviewed the patient's chart and labs.  Questions were answered to the patient's satisfaction.   Cath Lab Visit (complete for each Cath Lab visit)  Clinical Evaluation Leading to the Procedure:   ACS: No.  Non-ACS:    Anginal Classification: CCS II  Anti-ischemic medical therapy: Maximal Therapy (2 or more classes of medications)  Non-Invasive Test Results: Intermediate-risk stress test findings: cardiac mortality 1-3%/year  Prior CABG: Previous CABG        [name] 12/23/2014 4:00 PM'

## 2014-12-23 NOTE — Discharge Instructions (Signed)
Radial Site Care °Refer to this sheet in the next few weeks. These instructions provide you with information on caring for yourself after your procedure. Your caregiver may also give you more specific instructions. Your treatment has been planned according to current medical practices, but problems sometimes occur. Call your caregiver if you have any problems or questions after your procedure. °HOME CARE INSTRUCTIONS °· You may shower the day after the procedure. Remove the bandage (dressing) and gently wash the site with plain soap and water. Gently pat the site dry. °· Do not apply powder or lotion to the site. °· Do not submerge the affected site in water for 3 to 5 days. °· Inspect the site at least twice daily. °· Do not flex or bend the affected arm for 24 hours. °· No lifting over 5 pounds (2.3 kg) for 5 days after your procedure. °· Do not drive home if you are discharged the same day of the procedure. Have someone else drive you. °· You may drive 24 hours after the procedure unless otherwise instructed by your caregiver. °· Do not operate machinery or power tools for 24 hours. °· A responsible adult should be with you for the first 24 hours after you arrive home. °What to expect: °· Any bruising will usually fade within 1 to 2 weeks. °· Blood that collects in the tissue (hematoma) may be painful to the touch. It should usually decrease in size and tenderness within 1 to 2 weeks. °SEEK IMMEDIATE MEDICAL CARE IF: °· You have unusual pain at the radial site. °· You have redness, warmth, swelling, or pain at the radial site. °· You have drainage (other than a small amount of blood on the dressing). °· You have chills. °· You have a fever or persistent symptoms for more than 72 hours. °· You have a fever and your symptoms suddenly get worse. °· Your arm becomes pale, cool, tingly, or numb. °· You have heavy bleeding from the site. Hold pressure on the site. °Document Released: 04/16/2010 Document Revised:  06/06/2011 Document Reviewed: 04/16/2010 °ExitCare® Patient Information ©2015 [employer]. This information is not intended to replace advice given to you by your health care provider. Make sure you discuss any questions you have with your health care provider. ° °

## 2014-12-24 ENCOUNTER — Encounter (HOSPITAL_COMMUNITY): Payer: Self-pay | Admitting: Cardiology

## 2014-12-25 ENCOUNTER — Encounter (HOSPITAL_COMMUNITY): Payer: Self-pay

## 2014-12-25 ENCOUNTER — Encounter (HOSPITAL_COMMUNITY)
Admission: RE | Admit: 2014-12-25 | Discharge: 2014-12-25 | Disposition: A | Discharge status: Home / Self Care | Payer: Medicare Other | Source: Ambulatory Visit | Attending: Vascular Surgery | Admitting: Vascular Surgery

## 2014-12-25 DIAGNOSIS — Z01818 Encounter for other preprocedural examination: Secondary | ICD-10-CM | POA: Diagnosis present

## 2014-12-25 DIAGNOSIS — I70212 Atherosclerosis of native arteries of extremities with intermittent claudication, left leg: Secondary | ICD-10-CM | POA: Insufficient documentation

## 2014-12-25 HISTORY — DX: Acute myocardial infarction, unspecified: I21.9

## 2014-12-25 LAB — PROTIME-INR
INR: 1.41 (ref 0.00–1.49)
PROTHROMBIN TIME: 17.4 s — AB (ref 11.6–15.2)

## 2014-12-25 LAB — URINALYSIS, ROUTINE W REFLEX MICROSCOPIC
Bilirubin Urine: NEGATIVE
GLUCOSE, UA: 500 mg/dL — AB
Hgb urine dipstick: NEGATIVE
Ketones, ur: NEGATIVE mg/dL
LEUKOCYTES UA: NEGATIVE
NITRITE: NEGATIVE
PH: 6 (ref 5.0–8.0)
PROTEIN: NEGATIVE mg/dL
Specific Gravity, Urine: 1.022 (ref 1.005–1.030)
Urobilinogen, UA: 1 mg/dL (ref 0.0–1.0)

## 2014-12-25 LAB — SURGICAL PCR SCREEN
MRSA, PCR: NEGATIVE
STAPHYLOCOCCUS AUREUS: NEGATIVE

## 2014-12-25 LAB — APTT: aPTT: 38 seconds — ABNORMAL HIGH (ref 24–37)

## 2014-12-25 LAB — COMPREHENSIVE METABOLIC PANEL
ALK PHOS: 92 U/L (ref 38–126)
ALT: 15 U/L — AB (ref 17–63)
AST: 24 U/L (ref 15–41)
Albumin: 3.7 g/dL (ref 3.5–5.0)
Anion gap: 7 (ref 5–15)
BUN: 22 mg/dL — ABNORMAL HIGH (ref 6–20)
CALCIUM: 9.4 mg/dL (ref 8.9–10.3)
CO2: 25 mmol/L (ref 22–32)
CREATININE: 1.03 mg/dL (ref 0.61–1.24)
Chloride: 104 mmol/L (ref 101–111)
Glucose, Bld: 217 mg/dL — ABNORMAL HIGH (ref 65–99)
Potassium: 4.1 mmol/L (ref 3.5–5.1)
Sodium: 136 mmol/L (ref 135–145)
Total Bilirubin: 0.6 mg/dL (ref 0.3–1.2)
Total Protein: 6.7 g/dL (ref 6.5–8.1)

## 2014-12-25 LAB — CBC
HEMATOCRIT: 26.8 % — AB (ref 39.0–52.0)
HEMOGLOBIN: 9 g/dL — AB (ref 13.0–17.0)
MCH: 31.6 pg (ref 26.0–34.0)
MCHC: 33.6 g/dL (ref 30.0–36.0)
MCV: 94 fL (ref 78.0–100.0)
Platelets: 154 10*3/uL (ref 150–400)
RBC: 2.85 MIL/uL — AB (ref 4.22–5.81)
RDW: 14.3 % (ref 11.5–15.5)
WBC: 5.5 10*3/uL (ref 4.0–10.5)

## 2014-12-25 LAB — PREPARE RBC (CROSSMATCH)

## 2014-12-25 LAB — GLUCOSE, CAPILLARY: GLUCOSE-CAPILLARY: 195 mg/dL — AB (ref 65–99)

## 2014-12-25 NOTE — Pre-Procedure Instructions (Signed)
[name]  12/25/2014      [id number] - [name], [name] - [place] [address] [place] [address] Phone: [phone] Fax: [phone]    Your procedure is scheduled on Wednesday  12/31/14  Report to [facility] Admitting at 630 A.M.  Call this number if you have problems the morning of surgery:  [phone]   Remember:  Do not eat food or drink liquids after midnight.  Take these medicines the morning of surgery with A SIP OF WATER  ALPRAZOLAM (XANAX), AMLODIPINE (NORVASC), CARVEDILOL (COREG)   (stop plavix as instructed) How to Manage Your Diabetes Before Surgery   Why is it important to control my blood sugar before and after surgery?   Improving blood sugar levels before and after surgery helps healing and can limit problems.  A way of improving blood sugar control is eating a healthy diet by:  - Eating less sugar and carbohydrates  - Increasing activity/exercise  - Talk with your doctor about reaching your blood sugar goals  High blood sugars (greater than 180 mg/dL) can raise your risk of infections and slow down your recovery so you will need to focus on controlling your diabetes during the weeks before surgery.  Make sure that the doctor who takes care of your diabetes knows about your planned surgery including the date and location.  How do I manage my blood sugars before surgery?   Check your blood sugar at least 4 times a day, 2 days before surgery to make sure that they are not too high or low.   Check your blood sugar the morning of your surgery when you wake up and every 2               hours until you get to the Short-Stay unit.  If your blood sugar is less than 70 mg/dL, you will need to treat for low blood sugar by:  Treat a low blood sugar (less than 70 mg/dL) with 1/2 cup of clear juice (cranberry or apple), 4 glucose tablets, OR glucose gel.  Recheck blood sugar in 15 minutes  after treatment (to make sure it is greater than 70 mg/dL).  If blood sugar is not greater than 70 mg/dL on re-check, call [phone] for further instructions.   Report your blood sugar to the Short-Stay nurse when you get to Short-Stay.  References:  [facility], 2007 "How to Manage your Diabetes Before and After Surgery".  What do I do about my diabetes medications?   Do not take oral diabetes medicines (pills) the morning of surgery    Do not take other diabetes injectables the day of surgery including Byetta, Victoza, Bydureon, and Trulicity.    If your CBG is greater than 220 mg/dL, you may take 1/2 of your sliding scale (correction) dose of insulin.   For patients with "Insulin Pumps":  Contact your diabetes doctor for specific instructions before surgery.   Decrease basal insulin rates by 20% at midnight the night before surgery.  Note that if your surgery is planned to be longer than 2 hours, your insulin pump will be removed and intravenous (IV) insulin will be started and managed by the nurses and anesthesiologist.  You will be able to restart your insulin pump once you are awake and able to manage it.  Make sure to bring insulin pump supplies to the hospital with you in case  your site needs to be changed.        Do not wear jewelry, make-up or nail polish.  Do not wear lotions, powders, or perfumes.  You may wear deodorant.  Do not shave 48 hours prior to surgery.  Men may shave face and neck.  Do not bring valuables to the hospital.  [facility] is not responsible for any belongings or valuables.  Contacts, dentures or bridgework may not be worn into surgery.  Leave your suitcase in the car.  After surgery it may be brought to your room.  For patients admitted to the hospital, discharge time will be determined by your treatment team.  Patients discharged the day of surgery will not be allowed to drive home.   Name and phone number  of your driver:    Special instructions:  [facility] - Preparing for Surgery  Before surgery, you can play an important role.  Because skin is not sterile, your skin needs to be as free of germs as possible.  You can reduce the number of germs on you skin by washing with CHG (chlorahexidine gluconate) soap before surgery.  CHG is an antiseptic cleaner which kills germs and bonds with the skin to continue killing germs even after washing.  Please DO NOT use if you have an allergy to CHG or antibacterial soaps.  If your skin becomes reddened/irritated stop using the CHG and inform your nurse when you arrive at Short Stay.  Do not shave (including legs and underarms) for at least 48 hours prior to the first CHG shower.  You may shave your face.  Please follow these instructions carefully:   1.  Shower with CHG Soap the night before surgery and the                                morning of Surgery.  2.  If you choose to wash your hair, wash your hair first as usual with your       normal shampoo.  3.  After you shampoo, rinse your hair and body thoroughly to remove the                      Shampoo.  4.  Use CHG as you would any other liquid soap.  You can apply chg directly       to the skin and wash gently with scrungie or a clean washcloth.  5.  Apply the CHG Soap to your body ONLY FROM THE NECK DOWN.        Do not use on open wounds or open sores.  Avoid contact with your eyes,       ears, mouth and genitals (private parts).  Wash genitals (private parts)       with your normal soap.  6.  Wash thoroughly, paying special attention to the area where your surgery        will be performed.  7.  Thoroughly rinse your body with warm water from the neck down.  8.  DO NOT shower/wash with your normal soap after using and rinsing off       the CHG Soap.  9.  Pat yourself dry with a clean towel.            10.  Wear clean pajamas.            11.  Place clean sheets on your bed  the night of your first  shower and do not        sleep with pets.  Day of Surgery  Do not apply any lotions/deoderants the morning of surgery.  Please wear clean clothes to the hospital/surgery center.    Please read over the following fact sheets that you were given. Pain Booklet, Coughing and Deep Breathing, Blood Transfusion Information, MRSA Information and Surgical Site Infection Prevention

## 2014-12-25 NOTE — Progress Notes (Signed)
Patient stated he was instructed by DrOneida Alar office to continue aspirin and  His cardiologist asked him to stop plavix 7 days prior to surgery.

## 2014-12-25 NOTE — Progress Notes (Addendum)
Anesthesia Chart Review:  Pt is 74 year old male scheduled for B femoral endarterectomy, fem-fem bypass graft on 12/31/2014 with Dr. Oneida Alar.   PMH includes: CAD (LM stent; s/p CABG LIMA to LAD; redo CABG 2005: RIMA to OM, SVG to diagonal, DVG to PDA), diastolic CHF, PAD, HTN, hyperlipidemia, DM, melanoma. Former smoker. BMI 26.5. S/p exploratory laparotomy, LOA 08/02/14. S/p lumbar kyphoplasty 02/29/12.   Medications include: amlodipine, ASA, carvedilol, plavix, glimepiride, losartan, pioglitazone, simvastatin. Pt to stop plavix 1 week prior to surgery.   Preoperative labs reviewed.  H/H 9.0/26.8; added crossmatch for 2 units RBCs. Glucose 217, hgbA1c 6.7. PT 17.4 (INR 1.41), PTT 38.   EKG 12/23/2014: Sinus rhythm with occasional PVCs. Nonspecific T wave abnormality. Prolonged QT.  Cardiac cath 12/23/2014 for abnormal stress test:  1st Diag lesion, 80% stenosed.  Prox LAD lesion, 100% stenosed.  Ost Cx to Prox Cx lesion, 100% stenosed.  Mid RCA lesion, 100% stenosed.  SVG was injected is normal in caliber, and is anatomically normal.  SVG was injected is normal in caliber, and is anatomically normal.  LIMA was injected is normal in caliber, and is anatomically normal.  1st Mrg lesion, 90% stenosed.  Ost 2nd Diag to 2nd Diag lesion, 100% stenosed. 1. Severe 3 vessel obstructive CAD 2. Patent LIMA to the LAD 3. Patent free RIMA to the first OM. There is a severe stenosis in the proximal OM which limits flow into the distal LCx. 4. Patent SVG to PDA- this fills the entire RCA 5. Mildly elevated EDP. --Prior notes indicate a SVG to a diagonal. This was not seen. There was no aortic marker for this graft. The first diagonal has good flow from the native vessel. There are left to left collaterals from the OM 1 to a second diagonal. Given limitations of radial access I did not spend a lot of time searching for this graft. It appears that his circulation is well accounted for.  --Plan:  continue medical therapy. He is cleared to proceed with major vascular surgery from a cardiac standpoint and may stop Plavix one week prior.   Echo 08/09/2014:  - Left ventricle: There is basal and mid inferolateral hypokinesis. The cavity size was normal. Systolic function was mildly reduced. The estimated ejection fraction was 45%. Wall motion was normal; there were no regional wall motion abnormalities. Features are consistent with a pseudonormal left ventricular filling pattern, with concomitant abnormal relaxation and increased filling pressure (grade 2 diastolic dysfunction). Doppler parameters are consistent with elevated ventricular end-diastolic filling pressure. - Aortic valve: Trileaflet; moderately thickened, moderately calcified leaflets. There was no regurgitation. - Aortic root: The aortic root was normal in size. - Mitral valve: Calcified annulus. Mildly thickened leaflets . The findings are consistent with mild stenosis. There was mild regurgitation. Valve area by pressure half-time: 2.44 cm^2. - Left atrium: The atrium was mildly dilated. - Right ventricle: Systolic function was mildly reduced. - Tricuspid valve: There was mild-moderate regurgitation. - Pulmonic valve: Structurally normal valve. - Pulmonary arteries: Systolic pressure was mildly increased. PA peak pressure: 51 mm Hg (S). - Pericardium, extracardiac: There was no pericardial effusion. There was a left pleural effusion.  Notified Stephanie in Dr. Oneida Alar' office of low H/H and order for crossmatch.  Will defer to Dr. Oneida Alar and/or anesthesiologist if pt will require transfusion.   If no changes, I anticipate pt can proceed with surgery as scheduled.   Willeen Cass, FNP-BC [facility]/Anesthesiology Phone: [phone] 12/25/2014 2:57 PM

## 2014-12-26 ENCOUNTER — Telehealth: Payer: Self-pay | Admitting: Cardiology

## 2014-12-26 LAB — HEMOGLOBIN A1C
HEMOGLOBIN A1C: 6.7 % — AB (ref 4.8–5.6)
MEAN PLASMA GLUCOSE: 146 mg/dL

## 2014-12-26 NOTE — Telephone Encounter (Signed)
Dr. Martinique approved patient request to cancel appointment on 10/04 and reschedule with PA in a month

## 2014-12-26 NOTE — Telephone Encounter (Signed)
Would like to know if this appt for the f/u cath is really neccessary for him to keep

## 2014-12-30 ENCOUNTER — Ambulatory Visit: Payer: Medicare Other | Admitting: Physician Assistant

## 2014-12-30 MED ORDER — CHLORHEXIDINE GLUCONATE CLOTH 2 % EX PADS: 6.0000 | MEDICATED_PAD | Freq: Once | CUTANEOUS | Status: DC

## 2014-12-30 MED ORDER — SODIUM CHLORIDE 0.9 % IV SOLN
INTRAVENOUS | Status: DC
  Administered 2014-12-31: 15:00:00 via INTRAVENOUS

## 2014-12-30 MED ORDER — DEXTROSE 5 % IV SOLN
1.5000 g | INTRAVENOUS | Status: AC
  Administered 2014-12-31: 1.5 g via INTRAVENOUS
  Filled 2014-12-30: qty 1.5

## 2014-12-30 NOTE — Anesthesia Preprocedure Evaluation (Addendum)
Anesthesia Evaluation  Patient identified by MRN, date of birth, ID band Patient awake    Reviewed: Allergy & Precautions, NPO status , Patient's Chart, lab work & pertinent test results, reviewed documented beta blocker date and time   Airway Mallampati: III  TM Distance: >3 FB Neck ROM: Full    Dental  (+) Teeth Intact, Dental Advisory Given   Pulmonary former smoker (quit 1987),    breath sounds clear to auscultation       Cardiovascular hypertension, Pt. on medications + CAD, + Past MI, + Peripheral Vascular Disease and +CHF   Rhythm:Regular  CABG 2005, CATH 12/23/2014, Cleared by cardiology, ECHO 07/2014 EF 45%   Neuro/Psych    GI/Hepatic   Endo/Other  diabetes, Type 2  Renal/GU      Musculoskeletal   Abdominal (+)  Abdomen: soft.    Peds  Hematology  (+) anemia , 9/27   Anesthesia Other Findings   Reproductive/Obstetrics                          Anesthesia Physical Anesthesia Plan  ASA: III  Anesthesia Plan: General   Post-op Pain Management:    Induction: Intravenous  Airway Management Planned: Oral ETT  Additional Equipment: Arterial line  Intra-op Plan:   Post-operative Plan: Extubation in OR  Informed Consent: I have reviewed the patients History and Physical, chart, labs and discussed the procedure including the risks, benefits and alternatives for the proposed anesthesia with the patient or authorized representative who has indicated his/her understanding and acceptance.     Plan Discussed with:   Anesthesia Plan Comments: (Verify T&C, 2 IV, art line, will probably need blood, would have in room in cooler)        Anesthesia Quick Evaluation

## 2014-12-31 ENCOUNTER — Inpatient Hospital Stay (HOSPITAL_COMMUNITY): Payer: Medicare Other | Admitting: Anesthesiology

## 2014-12-31 ENCOUNTER — Inpatient Hospital Stay (HOSPITAL_COMMUNITY)
Admission: RE | Admit: 2014-12-31 | Discharge: 2015-01-03 | DRG: 253 | Disposition: A | Discharge status: Home / Self Care | Payer: Medicare Other | Source: Ambulatory Visit | Attending: Vascular Surgery | Admitting: Vascular Surgery

## 2014-12-31 ENCOUNTER — Encounter (HOSPITAL_COMMUNITY)
Admission: RE | Disposition: A | Discharge status: Home / Self Care | Payer: Self-pay | Source: Ambulatory Visit | Attending: Vascular Surgery

## 2014-12-31 ENCOUNTER — Inpatient Hospital Stay (HOSPITAL_COMMUNITY): Payer: Medicare Other | Admitting: Emergency Medicine

## 2014-12-31 ENCOUNTER — Encounter (HOSPITAL_COMMUNITY): Payer: Self-pay | Admitting: *Deleted

## 2014-12-31 DIAGNOSIS — Z23 Encounter for immunization: Secondary | ICD-10-CM | POA: Diagnosis not present

## 2014-12-31 DIAGNOSIS — Z7982 Long term (current) use of aspirin: Secondary | ICD-10-CM

## 2014-12-31 DIAGNOSIS — Z79899 Other long term (current) drug therapy: Secondary | ICD-10-CM | POA: Diagnosis not present

## 2014-12-31 DIAGNOSIS — M1990 Unspecified osteoarthritis, unspecified site: Secondary | ICD-10-CM | POA: Diagnosis present

## 2014-12-31 DIAGNOSIS — Z7902 Long term (current) use of antithrombotics/antiplatelets: Secondary | ICD-10-CM | POA: Diagnosis not present

## 2014-12-31 DIAGNOSIS — I5032 Chronic diastolic (congestive) heart failure: Secondary | ICD-10-CM | POA: Diagnosis present

## 2014-12-31 DIAGNOSIS — I110 Hypertensive heart disease with heart failure: Secondary | ICD-10-CM | POA: Diagnosis present

## 2014-12-31 DIAGNOSIS — Z7984 Long term (current) use of oral hypoglycemic drugs: Secondary | ICD-10-CM | POA: Diagnosis not present

## 2014-12-31 DIAGNOSIS — I2510 Atherosclerotic heart disease of native coronary artery without angina pectoris: Secondary | ICD-10-CM | POA: Diagnosis present

## 2014-12-31 DIAGNOSIS — I739 Peripheral vascular disease, unspecified: Secondary | ICD-10-CM | POA: Diagnosis present

## 2014-12-31 DIAGNOSIS — D62 Acute posthemorrhagic anemia: Secondary | ICD-10-CM | POA: Diagnosis present

## 2014-12-31 DIAGNOSIS — Z888 Allergy status to other drugs, medicaments and biological substances status: Secondary | ICD-10-CM

## 2014-12-31 DIAGNOSIS — E785 Hyperlipidemia, unspecified: Secondary | ICD-10-CM | POA: Diagnosis present

## 2014-12-31 DIAGNOSIS — M25552 Pain in left hip: Secondary | ICD-10-CM | POA: Diagnosis present

## 2014-12-31 DIAGNOSIS — E1151 Type 2 diabetes mellitus with diabetic peripheral angiopathy without gangrene: Secondary | ICD-10-CM | POA: Diagnosis present

## 2014-12-31 DIAGNOSIS — Z951 Presence of aortocoronary bypass graft: Secondary | ICD-10-CM | POA: Diagnosis not present

## 2014-12-31 DIAGNOSIS — I70212 Atherosclerosis of native arteries of extremities with intermittent claudication, left leg: Secondary | ICD-10-CM | POA: Diagnosis present

## 2014-12-31 DIAGNOSIS — Z87891 Personal history of nicotine dependence: Secondary | ICD-10-CM | POA: Diagnosis not present

## 2014-12-31 DIAGNOSIS — Z9889 Other specified postprocedural states: Secondary | ICD-10-CM

## 2014-12-31 DIAGNOSIS — I252 Old myocardial infarction: Secondary | ICD-10-CM

## 2014-12-31 HISTORY — PX: ENDARTERECTOMY FEMORAL: SHX5804

## 2014-12-31 HISTORY — PX: FEMORAL-FEMORAL BYPASS GRAFT: SHX936

## 2014-12-31 LAB — GLUCOSE, CAPILLARY
Glucose-Capillary: 147 mg/dL — ABNORMAL HIGH (ref 65–99)
Glucose-Capillary: 157 mg/dL — ABNORMAL HIGH (ref 65–99)
Glucose-Capillary: 151 mg/dL — ABNORMAL HIGH (ref 65–99)
Glucose-Capillary: 148 mg/dL — ABNORMAL HIGH (ref 65–99)

## 2014-12-31 LAB — CBC
HCT: 27.4 % — ABNORMAL LOW (ref 39.0–52.0)
HEMOGLOBIN: 9.4 g/dL — AB (ref 13.0–17.0)
MCH: 30.4 pg (ref 26.0–34.0)
MCHC: 34.3 g/dL (ref 30.0–36.0)
MCV: 88.7 fL (ref 78.0–100.0)
PLATELETS: 148 10*3/uL — AB (ref 150–400)
RBC: 3.09 MIL/uL — ABNORMAL LOW (ref 4.22–5.81)
RDW: 15.6 % — AB (ref 11.5–15.5)
WBC: 8.1 10*3/uL (ref 4.0–10.5)

## 2014-12-31 LAB — CREATININE, SERUM
CREATININE: 0.87 mg/dL (ref 0.61–1.24)
GFR calc Af Amer: >60 mL/min (ref >60)

## 2014-12-31 LAB — BLOOD PRODUCT ORDER (VERBAL) VERIFICATION

## 2014-12-31 SURGERY — ENDARTERECTOMY, FEMORAL
Anesthesia: General | Laterality: Bilateral

## 2014-12-31 MED ORDER — INSULIN ASPART 100 UNIT/ML ~~LOC~~ SOLN
0.0000 [IU] | Freq: Three times a day (TID) | SUBCUTANEOUS | Status: DC
  Administered 2014-12-31 – 2015-01-01 (×2): 2 [IU] via SUBCUTANEOUS
  Administered 2015-01-02 (×2): 1 [IU] via SUBCUTANEOUS
  Administered 2015-01-02: 2 [IU] via SUBCUTANEOUS
  Administered 2015-01-03: 1 [IU] via SUBCUTANEOUS

## 2014-12-31 MED ORDER — ROCURONIUM BROMIDE 50 MG/5ML IV SOLN
INTRAVENOUS | Status: AC
  Filled 2014-12-31: qty 1

## 2014-12-31 MED ORDER — HEPARIN SODIUM (PORCINE) 1000 UNIT/ML IJ SOLN
INTRAMUSCULAR | Status: AC
  Filled 2014-12-31: qty 1

## 2014-12-31 MED ORDER — NITROGLYCERIN 0.4 MG SL SUBL: 0.4000 mg | SUBLINGUAL_TABLET | SUBLINGUAL | Status: DC | PRN

## 2014-12-31 MED ORDER — ONDANSETRON HCL 4 MG/2ML IJ SOLN
4.0000 mg | Freq: Four times a day (QID) | INTRAMUSCULAR | Status: DC | PRN
Start: 2014-12-31 — End: 2015-01-03

## 2014-12-31 MED ORDER — MORPHINE SULFATE (PF) 2 MG/ML IV SOLN: 2.0000 mg | INTRAVENOUS | Status: DC | PRN

## 2014-12-31 MED ORDER — SODIUM CHLORIDE 0.9 % IV SOLN: INTRAVENOUS | Status: DC

## 2014-12-31 MED ORDER — FENTANYL CITRATE (PF) 250 MCG/5ML IJ SOLN
INTRAMUSCULAR | Status: AC
  Filled 2014-12-31: qty 5

## 2014-12-31 MED ORDER — 0.9 % SODIUM CHLORIDE (POUR BTL) OPTIME
TOPICAL | Status: DC | PRN
  Administered 2014-12-31: 3000 mL

## 2014-12-31 MED ORDER — ASPIRIN 81 MG PO TABS: 81.0000 mg | ORAL_TABLET | Freq: Every day | ORAL | Status: DC

## 2014-12-31 MED ORDER — ACETAMINOPHEN 650 MG RE SUPP: 325.0000 mg | RECTAL | Status: DC | PRN

## 2014-12-31 MED ORDER — GUAIFENESIN-DM 100-10 MG/5ML PO SYRP: 15.0000 mL | ORAL_SOLUTION | ORAL | Status: DC | PRN

## 2014-12-31 MED ORDER — ACETAMINOPHEN 325 MG PO TABS: 325.0000 mg | ORAL_TABLET | ORAL | Status: DC | PRN

## 2014-12-31 MED ORDER — PHENOL 1.4 % MT LIQD
1.0000 | OROMUCOSAL | Status: DC | PRN
  Administered 2015-01-01: 1 via OROMUCOSAL

## 2014-12-31 MED ORDER — NEOSTIGMINE METHYLSULFATE 10 MG/10ML IV SOLN
INTRAVENOUS | Status: DC | PRN
  Administered 2014-12-31: 4 mg via INTRAVENOUS

## 2014-12-31 MED ORDER — BISACODYL 5 MG PO TBEC: 5.0000 mg | DELAYED_RELEASE_TABLET | Freq: Every day | ORAL | Status: DC | PRN

## 2014-12-31 MED ORDER — HEPARIN SODIUM (PORCINE) 5000 UNIT/ML IJ SOLN
INTRAMUSCULAR | Status: DC | PRN
  Administered 2014-12-31: 500 mL

## 2014-12-31 MED ORDER — LIDOCAINE-EPINEPHRINE (PF) 1 %-1:200000 IJ SOLN
INTRAMUSCULAR | Status: AC
  Filled 2014-12-31: qty 30

## 2014-12-31 MED ORDER — MAGNESIUM SULFATE 2 GM/50ML IV SOLN
2.0000 g | Freq: Every day | INTRAVENOUS | Status: DC | PRN
  Filled 2014-12-31: qty 50

## 2014-12-31 MED ORDER — ONDANSETRON HCL 4 MG/2ML IJ SOLN
INTRAMUSCULAR | Status: DC | PRN
  Administered 2014-12-31: 4 mg via INTRAVENOUS

## 2014-12-31 MED ORDER — METOPROLOL TARTRATE 1 MG/ML IV SOLN: 2.0000 mg | INTRAVENOUS | Status: DC | PRN

## 2014-12-31 MED ORDER — MEPERIDINE HCL 25 MG/ML IJ SOLN: 6.2500 mg | INTRAMUSCULAR | Status: DC | PRN

## 2014-12-31 MED ORDER — PANTOPRAZOLE SODIUM 40 MG PO TBEC
40.0000 mg | DELAYED_RELEASE_TABLET | Freq: Every day | ORAL | Status: DC
  Administered 2014-12-31 – 2015-01-02 (×3): 40 mg via ORAL
  Filled 2014-12-31 (×4): qty 1

## 2014-12-31 MED ORDER — MIDAZOLAM HCL 5 MG/5ML IJ SOLN
INTRAMUSCULAR | Status: DC | PRN
  Administered 2014-12-31: 1 mg via INTRAVENOUS

## 2014-12-31 MED ORDER — LIDOCAINE HCL (CARDIAC) 20 MG/ML IV SOLN
INTRAVENOUS | Status: AC
  Filled 2014-12-31: qty 5

## 2014-12-31 MED ORDER — SODIUM CHLORIDE 0.9 % IV SOLN: Freq: Once | INTRAVENOUS | Status: DC

## 2014-12-31 MED ORDER — DEXTROSE 5 % IV SOLN
10.0000 mg | INTRAVENOUS | Status: DC | PRN
  Administered 2014-12-31: 20 ug/min via INTRAVENOUS

## 2014-12-31 MED ORDER — BUPIVACAINE HCL (PF) 0.25 % IJ SOLN
INTRAMUSCULAR | Status: AC
  Filled 2014-12-31: qty 30

## 2014-12-31 MED ORDER — CLOPIDOGREL BISULFATE 75 MG PO TABS
75.0000 mg | ORAL_TABLET | Freq: Every day | ORAL | Status: DC
  Administered 2015-01-02: 75 mg via ORAL
  Filled 2014-12-31 (×2): qty 1

## 2014-12-31 MED ORDER — CEFUROXIME SODIUM 1.5 G IJ SOLR
1.5000 g | Freq: Two times a day (BID) | INTRAMUSCULAR | Status: AC
  Administered 2014-12-31 – 2015-01-01 (×2): 1.5 g via INTRAVENOUS
  Filled 2014-12-31 (×2): qty 1.5

## 2014-12-31 MED ORDER — HYDRALAZINE HCL 20 MG/ML IJ SOLN: 5.0000 mg | INTRAMUSCULAR | Status: DC | PRN

## 2014-12-31 MED ORDER — PHENYLEPHRINE 40 MCG/ML (10ML) SYRINGE FOR IV PUSH (FOR BLOOD PRESSURE SUPPORT)
PREFILLED_SYRINGE | INTRAVENOUS | Status: AC
  Filled 2014-12-31: qty 10

## 2014-12-31 MED ORDER — GLIMEPIRIDE 4 MG PO TABS
4.0000 mg | ORAL_TABLET | Freq: Every day | ORAL | Status: DC
  Administered 2015-01-01 – 2015-01-03 (×4): 4 mg via ORAL
  Filled 2014-12-31 (×5): qty 1

## 2014-12-31 MED ORDER — POTASSIUM CHLORIDE CRYS ER 20 MEQ PO TBCR: 20.0000 meq | EXTENDED_RELEASE_TABLET | Freq: Every day | ORAL | Status: DC | PRN

## 2014-12-31 MED ORDER — OXYCODONE-ACETAMINOPHEN 5-325 MG PO TABS
1.0000 | ORAL_TABLET | ORAL | Status: DC | PRN
  Administered 2015-01-01 (×2): 1 via ORAL
  Filled 2014-12-31 (×2): qty 1

## 2014-12-31 MED ORDER — NEOSTIGMINE METHYLSULFATE 10 MG/10ML IV SOLN
INTRAVENOUS | Status: AC
  Filled 2014-12-31: qty 1

## 2014-12-31 MED ORDER — DEXTROSE 5 % IV SOLN
1.5000 g | INTRAVENOUS | Status: AC
  Administered 2014-12-31: 1.5 g via INTRAVENOUS
  Filled 2014-12-31: qty 1.5

## 2014-12-31 MED ORDER — ONDANSETRON HCL 4 MG/2ML IJ SOLN
INTRAMUSCULAR | Status: AC
  Filled 2014-12-31: qty 2

## 2014-12-31 MED ORDER — HEPARIN SODIUM (PORCINE) 1000 UNIT/ML IJ SOLN
INTRAMUSCULAR | Status: DC | PRN
  Administered 2014-12-31: 8000 [IU] via INTRAVENOUS
  Administered 2014-12-31: 5000 [IU] via INTRAVENOUS

## 2014-12-31 MED ORDER — SENNOSIDES-DOCUSATE SODIUM 8.6-50 MG PO TABS
1.0000 | ORAL_TABLET | Freq: Every evening | ORAL | Status: DC | PRN
Start: 2014-12-31 — End: 2015-01-03
  Filled 2014-12-31: qty 1

## 2014-12-31 MED ORDER — PHENYLEPHRINE HCL 10 MG/ML IJ SOLN
INTRAMUSCULAR | Status: DC | PRN
  Administered 2014-12-31: 40 ug via INTRAVENOUS

## 2014-12-31 MED ORDER — GLYCOPYRROLATE 0.2 MG/ML IJ SOLN
INTRAMUSCULAR | Status: AC
  Filled 2014-12-31: qty 3

## 2014-12-31 MED ORDER — LIDOCAINE HCL (CARDIAC) 20 MG/ML IV SOLN
INTRAVENOUS | Status: DC | PRN
  Administered 2014-12-31: 100 mg via INTRAVENOUS

## 2014-12-31 MED ORDER — SIMVASTATIN 20 MG PO TABS
20.0000 mg | ORAL_TABLET | Freq: Every day | ORAL | Status: DC
  Administered 2015-01-01 – 2015-01-02 (×2): 20 mg via ORAL
  Filled 2014-12-31 (×3): qty 1

## 2014-12-31 MED ORDER — ALUM & MAG HYDROXIDE-SIMETH 200-200-20 MG/5ML PO SUSP: 15.0000 mL | ORAL | Status: DC | PRN

## 2014-12-31 MED ORDER — SUCCINYLCHOLINE CHLORIDE 20 MG/ML IJ SOLN
INTRAMUSCULAR | Status: AC
  Filled 2014-12-31: qty 1

## 2014-12-31 MED ORDER — HYDROMORPHONE HCL 1 MG/ML IJ SOLN
0.2500 mg | INTRAMUSCULAR | Status: DC | PRN
  Administered 2014-12-31 (×2): 0.5 mg via INTRAVENOUS

## 2014-12-31 MED ORDER — DOCUSATE SODIUM 100 MG PO CAPS
100.0000 mg | ORAL_CAPSULE | Freq: Every day | ORAL | Status: DC
  Administered 2015-01-01 – 2015-01-02 (×2): 100 mg via ORAL
  Filled 2014-12-31 (×3): qty 1

## 2014-12-31 MED ORDER — PIOGLITAZONE HCL 45 MG PO TABS
45.0000 mg | ORAL_TABLET | Freq: Every day | ORAL | Status: DC
  Administered 2014-12-31 – 2015-01-02 (×3): 45 mg via ORAL
  Filled 2014-12-31 (×4): qty 1

## 2014-12-31 MED ORDER — HYDROMORPHONE HCL 1 MG/ML IJ SOLN
INTRAMUSCULAR | Status: AC
  Administered 2014-12-31: 0.5 mg via INTRAVENOUS
  Filled 2014-12-31: qty 1

## 2014-12-31 MED ORDER — SODIUM CHLORIDE 0.9 % IV SOLN: 500.0000 mL | Freq: Once | INTRAVENOUS | Status: DC | PRN

## 2014-12-31 MED ORDER — ALPRAZOLAM 0.5 MG PO TABS: 0.5000 mg | ORAL_TABLET | Freq: Three times a day (TID) | ORAL | Status: DC | PRN

## 2014-12-31 MED ORDER — ROCURONIUM BROMIDE 100 MG/10ML IV SOLN
INTRAVENOUS | Status: DC | PRN
  Administered 2014-12-31: 50 mg via INTRAVENOUS

## 2014-12-31 MED ORDER — LOSARTAN POTASSIUM 50 MG PO TABS
100.0000 mg | ORAL_TABLET | Freq: Every day | ORAL | Status: DC
  Administered 2015-01-01 – 2015-01-02 (×2): 100 mg via ORAL
  Filled 2014-12-31 (×3): qty 2

## 2014-12-31 MED ORDER — SURGIFOAM 100 EX MISC
CUTANEOUS | Status: DC | PRN
  Administered 2014-12-31: 20 mL via TOPICAL

## 2014-12-31 MED ORDER — MIDAZOLAM HCL 2 MG/2ML IJ SOLN
INTRAMUSCULAR | Status: AC
  Filled 2014-12-31: qty 4

## 2014-12-31 MED ORDER — SODIUM CHLORIDE 0.9 % IJ SOLN
INTRAMUSCULAR | Status: AC
  Filled 2014-12-31: qty 10

## 2014-12-31 MED ORDER — PROPOFOL 10 MG/ML IV BOLUS
INTRAVENOUS | Status: DC | PRN
  Administered 2014-12-31: 100 mg via INTRAVENOUS

## 2014-12-31 MED ORDER — INFLUENZA VAC SPLIT QUAD 0.5 ML IM SUSY
0.5000 mL | PREFILLED_SYRINGE | INTRAMUSCULAR | Status: DC
  Filled 2014-12-31: qty 0.5

## 2014-12-31 MED ORDER — PROTAMINE SULFATE 10 MG/ML IV SOLN
INTRAVENOUS | Status: AC
  Filled 2014-12-31: qty 10

## 2014-12-31 MED ORDER — EPHEDRINE SULFATE 50 MG/ML IJ SOLN
INTRAMUSCULAR | Status: AC
  Filled 2014-12-31: qty 1

## 2014-12-31 MED ORDER — ALBUMIN HUMAN 5 % IV SOLN
INTRAVENOUS | Status: DC | PRN
  Administered 2014-12-31: 11:00:00 via INTRAVENOUS

## 2014-12-31 MED ORDER — CARVEDILOL 12.5 MG PO TABS
12.5000 mg | ORAL_TABLET | Freq: Two times a day (BID) | ORAL | Status: DC
  Administered 2014-12-31 – 2015-01-03 (×5): 12.5 mg via ORAL
  Filled 2014-12-31 (×8): qty 1

## 2014-12-31 MED ORDER — GLYCOPYRROLATE 0.2 MG/ML IJ SOLN
INTRAMUSCULAR | Status: AC
  Filled 2014-12-31: qty 1

## 2014-12-31 MED ORDER — GLYCOPYRROLATE 0.2 MG/ML IJ SOLN
INTRAMUSCULAR | Status: DC | PRN
Start: 2014-12-31 — End: 2014-12-31
  Administered 2014-12-31: .5 mg via INTRAVENOUS
  Administered 2014-12-31: 0.2 mg via INTRAVENOUS

## 2014-12-31 MED ORDER — AMLODIPINE BESYLATE 5 MG PO TABS
5.0000 mg | ORAL_TABLET | Freq: Every day | ORAL | Status: DC
  Administered 2015-01-01 – 2015-01-02 (×2): 5 mg via ORAL
  Filled 2014-12-31 (×2): qty 1

## 2014-12-31 MED ORDER — LACTATED RINGERS IV SOLN
INTRAVENOUS | Status: DC | PRN
  Administered 2014-12-31 (×2): via INTRAVENOUS

## 2014-12-31 MED ORDER — LACTATED RINGERS IV SOLN
INTRAVENOUS | Status: DC | PRN
Start: 2014-12-31 — End: 2014-12-31
  Administered 2014-12-31 (×2): via INTRAVENOUS

## 2014-12-31 MED ORDER — ENOXAPARIN SODIUM 30 MG/0.3ML ~~LOC~~ SOLN
30.0000 mg | SUBCUTANEOUS | Status: DC
  Administered 2015-01-01 – 2015-01-02 (×2): 30 mg via SUBCUTANEOUS
  Filled 2014-12-31 (×3): qty 0.3

## 2014-12-31 MED ORDER — PROMETHAZINE HCL 25 MG/ML IJ SOLN: 6.2500 mg | INTRAMUSCULAR | Status: DC | PRN

## 2014-12-31 MED ORDER — THROMBIN 20000 UNITS EX SOLR
CUTANEOUS | Status: AC
  Filled 2014-12-31: qty 20000

## 2014-12-31 MED ORDER — LABETALOL HCL 5 MG/ML IV SOLN: 10.0000 mg | INTRAVENOUS | Status: DC | PRN

## 2014-12-31 MED ORDER — PROTAMINE SULFATE 10 MG/ML IV SOLN
INTRAVENOUS | Status: DC | PRN
Start: 2014-12-31 — End: 2014-12-31
  Administered 2014-12-31: 20 mg via INTRAVENOUS
  Administered 2014-12-31: 30 mg via INTRAVENOUS
  Administered 2014-12-31: 20 mg via INTRAVENOUS
  Administered 2014-12-31: 10 mg via INTRAVENOUS
  Administered 2014-12-31: 20 mg via INTRAVENOUS

## 2014-12-31 MED ORDER — FENTANYL CITRATE (PF) 100 MCG/2ML IJ SOLN
INTRAMUSCULAR | Status: DC | PRN
  Administered 2014-12-31: 100 ug via INTRAVENOUS
  Administered 2014-12-31 (×3): 50 ug via INTRAVENOUS

## 2014-12-31 MED ORDER — POLYSACCHARIDE IRON COMPLEX 150 MG PO CAPS
150.0000 mg | ORAL_CAPSULE | Freq: Every day | ORAL | Status: DC
  Administered 2014-12-31 – 2015-01-02 (×3): 150 mg via ORAL
  Filled 2014-12-31 (×4): qty 1

## 2014-12-31 MED ORDER — ASPIRIN EC 81 MG PO TBEC
81.0000 mg | DELAYED_RELEASE_TABLET | Freq: Every day | ORAL | Status: DC
  Administered 2015-01-01 – 2015-01-02 (×2): 81 mg via ORAL
  Filled 2014-12-31 (×2): qty 1

## 2014-12-31 MED ORDER — PROPOFOL 10 MG/ML IV BOLUS
INTRAVENOUS | Status: AC
  Filled 2014-12-31: qty 20

## 2014-12-31 SURGICAL SUPPLY — 50 items
BAG ISL DRAPE 18X18 STRL (DRAPES) ×2
BAG ISOLATION DRAPE 18X18 (DRAPES) ×1 IMPLANT
CANISTER SUCTION 2500CC (MISCELLANEOUS) ×2 IMPLANT
CANNULA VESSEL 3MM 2 BLNT TIP (CANNULA) ×4 IMPLANT
CLIP TI MEDIUM 24 (CLIP) ×2 IMPLANT
CLIP TI WIDE RED SMALL 24 (CLIP) ×2 IMPLANT
DRAIN SNY WOU (WOUND CARE) IMPLANT
DRAPE ISOLATION BAG 18X18 (DRAPES) ×2
ELECT REM PT RETURN 9FT ADLT (ELECTROSURGICAL) ×2
ELECTRODE REM PT RTRN 9FT ADLT (ELECTROSURGICAL) ×1 IMPLANT
EVACUATOR SILICONE 100CC (DRAIN) IMPLANT
GLOVE BIO SURGEON STRL SZ7.5 (GLOVE) ×2 IMPLANT
GLOVE BIOGEL PI IND STRL 7.5 (GLOVE) IMPLANT
GLOVE BIOGEL PI INDICATOR 7.5 (GLOVE) ×1
GLOVE ECLIPSE 7.0 STRL STRAW (GLOVE) ×1 IMPLANT
GLOVE SURG SS PI 7.0 STRL IVOR (GLOVE) ×1 IMPLANT
GOWN STRL REUS W/ TWL LRG LVL3 (GOWN DISPOSABLE) ×3 IMPLANT
GOWN STRL REUS W/TWL LRG LVL3 (GOWN DISPOSABLE) ×6
GRAFT CV 30X8WVN NDL (Graft) IMPLANT
GRAFT HEMASHIELD 8MM (Graft) IMPLANT
GRAFT HEMASHIELD 8MM (Vascular Products) ×2 IMPLANT
GRAFT VASC STRG 30X8KNIT (Vascular Products) IMPLANT
KIT BASIN OR (CUSTOM PROCEDURE TRAY) ×2 IMPLANT
KIT ROOM TURNOVER OR (KITS) ×2 IMPLANT
LIQUID BAND (GAUZE/BANDAGES/DRESSINGS) ×3 IMPLANT
LOOP VESSEL MAXI BLUE (MISCELLANEOUS) ×1 IMPLANT
LOOP VESSEL MINI RED (MISCELLANEOUS) ×2 IMPLANT
NS IRRIG 1000ML POUR BTL (IV SOLUTION) ×5 IMPLANT
PACK PERIPHERAL VASCULAR (CUSTOM PROCEDURE TRAY) ×2 IMPLANT
PAD ARMBOARD 7.5X6 YLW CONV (MISCELLANEOUS) ×4 IMPLANT
SPONGE INTESTINAL PEANUT (DISPOSABLE) ×1 IMPLANT
SPONGE SURGIFOAM ABS GEL 100 (HEMOSTASIS) IMPLANT
STAPLER VISISTAT 35W (STAPLE) IMPLANT
SUT ETHILON 3 0 PS 1 (SUTURE) IMPLANT
SUT PROLENE 5 0 C 1 24 (SUTURE) ×11 IMPLANT
SUT PROLENE 6 0 CC (SUTURE) ×2 IMPLANT
SUT SILK 3 0 (SUTURE) ×4
SUT SILK 3-0 18XBRD TIE 12 (SUTURE) IMPLANT
SUT VIC AB 2-0 CT1 27 (SUTURE) ×4
SUT VIC AB 2-0 CT1 TAPERPNT 27 (SUTURE) ×2 IMPLANT
SUT VIC AB 2-0 CTX 36 (SUTURE) ×1 IMPLANT
SUT VIC AB 2-0 SH 27 (SUTURE) ×2
SUT VIC AB 2-0 SH 27XBRD (SUTURE) IMPLANT
SUT VIC AB 3-0 SH 27 (SUTURE) ×6
SUT VIC AB 3-0 SH 27X BRD (SUTURE) ×2 IMPLANT
SUT VICRYL 4-0 PS2 18IN ABS (SUTURE) ×4 IMPLANT
TAPE UMBILICAL COTTON 1/8X30 (MISCELLANEOUS) ×1 IMPLANT
TRAY FOLEY W/METER SILVER 16FR (SET/KITS/TRAYS/PACK) ×2 IMPLANT
UNDERPAD 30X30 INCONTINENT (UNDERPADS AND DIAPERS) ×2 IMPLANT
WATER STERILE IRR 1000ML POUR (IV SOLUTION) ×2 IMPLANT

## 2014-12-31 NOTE — Progress Notes (Signed)
      Patient resting on and off.  Appears comfortable over all.  Feet warm with active range of motion. Palpable right DP/PT, doppler DP/PT on left Groin incisions soft without hematoma  S/P Bilateral common femoral endarterectomy, Right to left femoral-femoral bypass Disposition stable  Bernie Fobes MAUREEN PA-C

## 2014-12-31 NOTE — Transfer of Care (Signed)
Immediate Anesthesia Transfer of Care Note  Patient: [name]  Procedure(s) Performed: Procedure(s): BILATERAL FEMORAL ENDARTERECTOMY (Bilateral) FEMORAL-FEMORAL ARTERY BYPASS GRAFT (Bilateral)  Patient Location: PACU  Anesthesia Type:General  Level of Consciousness: awake, oriented and patient cooperative  Airway & Oxygen Therapy: Patient Spontanous Breathing and Patient connected to face mask oxygen  Post-op Assessment: Report given to RN and Post -op Vital signs reviewed and stable  Post vital signs: Reviewed  Last Vitals:  Filed Vitals:   12/31/14 0653  BP: 154/44  Pulse: 62  Temp: 36.8 C  Resp: 20    Complications: No apparent anesthesia complications

## 2014-12-31 NOTE — Addendum Note (Signed)
Addendum  created 12/31/14 1402 by Jenne Campus, CRNA   Modules edited: Clinical Notes   Clinical Notes:  File: [id number]

## 2014-12-31 NOTE — H&P (Signed)
Referred by:   Lona Kettle, MD [place], [place] [address]  Reason for referral: claudication  History of Present Illness  [name] is a 74 y.o. (04-13-1940) male who presents with chief complaint: left hip pain. Onset of symptom occurred 1 year ago. This pain occurs after walking one block. Sometimes he is able to walk two blocks before his hip hurts. He never has pain at rest. Rest relieves the pain. This pain limits him from walking and working in the yard. He used to be more active and walk 30 mins daily. He denies any rest pain or non healing wounds. He denies any symptoms in his right leg. The patient reports an accident where he fell onto his left hip 4 years ago from a 10 foot ladder. He believes this is unassociated with his current pain because he was asymptomatic for three years following the accident. Atherosclerotic risk factors include: history of CAD s/p redo CABG in 2005, hypertension, hyperlipidemia, diabetes mellitus and former smoker.   His current medical problems are stable.   His past surgical history includes recent exploratory laparotomy for small bowel obstruction (May 2015).   He denies any chest pain or shortness of breath. Full ROS below.     Past Medical History   Diagnosis  Date   .  CAD (coronary artery disease)     .  History of acute inferior wall MI     .  Diabetes mellitus     .  HTN (hypertension)     .  Dyslipidemia     .  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  BPH (benign prostatic hyperplasia)     .  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  Hyponatremia     .  Acute on chronic diastolic CHF (congestive heart failure), NYHA class 1  08/09/2014       Past Surgical History   Procedure  Laterality  Date   .  Coronary artery bypass graft    redo 2005       free Rima to OM, svg-diag,svg-pda   .  Tonsillectomy       .  Rotator cuff repair       .  Melanoma excision       .  Eye surgery           BILATERAL    .   Kyphoplasty    02/29/2012       Procedure: KYPHOPLASTY;  Surgeon: Sinclair Ship, MD;  Location: [facility];  Service: Orthopedics;  Laterality: Bilateral;  T 10 kyphoplasty   .  Laparotomy  N/A  08/02/2014       Procedure: EXPLORATORY LAPAROTOMY LYSIS OF ADHESIONS;  Surgeon: Donnie Mesa, MD;  Location: [facility];  Service: General;  Laterality: N/A;       Social History      Social History   .  Marital Status:  Married       Spouse Name:  N/A   .  Number of Children:  2   .  Years of Education:  N/A      Occupational History   .  insurance        Social History Main Topics   .  Smoking status:  Former Smoker       Quit date:  02/22/1986   .  Smokeless tobacco:  Not on file   .  Alcohol Use:  No   .  Drug Use:  No   .  Sexual Activity:  Not on file      Other Topics  Concern   .  Not on file      Social History Narrative       Family History   Problem  Relation  Age of Onset   .  Dementia  Mother         Current Outpatient Prescriptions on File Prior to Visit   Medication  Sig  Dispense  Refill   .  ALPRAZolam (XANAX) 0.5 MG tablet  Take 0.5 mg by mouth 3 (three) times daily as needed for anxiety.        Marland Kitchen  amLODipine (NORVASC) 5 MG tablet  Take 1 tablet (5 mg total) by mouth daily.  90 tablet  1   .  aspirin 81 MG tablet  Take 81 mg by mouth daily.         .  carvedilol (COREG) 12.5 MG tablet  TAKE 1 TABLET TWICE A DAY  180 tablet  2   .  clopidogrel (PLAVIX) 75 MG tablet  Take 1 tablet (75 mg total) by mouth daily.  90 tablet  2   .  glimepiride (AMARYL) 4 MG tablet  Take 4 mg by mouth daily before breakfast.        .  losartan (COZAAR) 100 MG tablet  Take 100 mg by mouth daily.        .  nitroGLYCERIN (NITROSTAT) 0.4 MG SL tablet  Place 1 tablet (0.4 mg total) under the tongue every 5 (five) minutes as needed for chest pain.  25 tablet  3   .  pioglitazone (ACTOS) 45 MG tablet  Take 45 mg by mouth daily.        .  simvastatin (ZOCOR) 20 MG tablet  Take 1 tablet (20 mg  total) by mouth at bedtime.  90 tablet  3      No current facility-administered medications on file prior to visit.       Allergies   Allergen  Reactions   .  Hctz [Hydrochlorothiazide]  Other (See Comments)       Hyponatremia       REVIEW OF SYSTEMS:  (Positives checked otherwise negative)  CARDIOVASCULAR:  [ ]  chest pain, [ ]  chest pressure, [ ]  palpitations, [ ]  shortness of breath when laying flat, [ ]  shortness of breath with exertion,   [s ] pain in legs when walking, [ ]  pain in feet when laying flat, [ ]  history of blood clot in veins (DVT), [ ]  history of phlebitis, [ ]  swelling in legs, [ ]  varicose veins  PULMONARY:  [ ]  productive cough, [ ]  asthma, [ ]  wheezing  NEUROLOGIC:  [s ] weakness in legs, [ ]  numbness in arms or legs, [ ]  difficulty speaking or slurred speech, [ ]  temporary loss of vision in one eye, [ ]  dizziness  HEMATOLOGIC:  [x]  bleeding problems, [ ]  problems with blood clotting too easily  MUSCULOSKEL:  [ ]  joint pain, [ ]  joint swelling  GASTROINTEST:  [ ]   Vomiting blood, [ ]   Blood in stool        GENITOURINARY:  [ ]   Burning with urination, [ ]   Blood in urine  PSYCHIATRIC:  [ ]  history of major depression  INTEGUMENTARY:  [ ]  rashes, [ ]  ulcers  CONSTITUTIONAL:  [ ]  fever, [ ]  chills  For  VQI Use Only  PRE-ADM LIVING: Home  AMB STATUS: Ambulatory  CAD Sx: History of MI, but no symptoms No MI within 6 months  PRIOR CHF: Mild, iatrogenic secondary to volume resuscitation with SBO.   STRESS TEST: [ ]  No, [ ]  Normal, [ ]  + ischemia, [ ]  + MI, [ ]  Both    Physical Examination Filed Vitals:     11/27/14 1255  11/27/14 1259   BP:  147/74  143/73   Pulse:  60  59   Temp:  98.1 F (36.7 C)     TempSrc:  Oral     Resp:  12     Height:  5\' 9"  (1.753 m)     Weight:  176 lb (79.833 kg)     SpO2:  100%      Body mass index is 25.98 kg/(m^2).  General: A&O x 3, WDWN appears stated age  Head: [place]/AT  Neck: Supple  Pulmonary: Sym  exp, good air movt, CTAB, no rales, rhonchi  Cardiac: RRR, Nl S1, S2, no Murmurs, rubs or gallops, no carotid bruits.    Vascular: 1+ right femoral pulse, non-palpable left femoral pulse. Non palpable popliteal and pedal pulses bilaterally   Gastrointestinal: soft, NTND, no masses  Musculoskeletal: M/S 5/5 throughot, Extremities without ischemic changes  Neurologic: No focal deficits. Motor and sensory intact.   Psychiatric: Judgment intact, Mood & affect appropriatefor pt's clinical situation  Dermatologic: See M/S exam for extremity exam, no rashes otherwise noted   Non-Invasive Vascular Imaging  ABI (Date: 11/27/2014)  R: 0.66, DP: monophasic, PT: monophasic, TBI: 0.46  L: non-compressible vessels, DP: monophasic, PT: monophasic, TBI: n/a  Outside Studies/Documentation 4 pages of outside documents were reviewed including: PCP.  Medical Decision Making  [name] is a 74 y.o. male who presents peripheral vascular disease with lifestyle limiting intermittent left hip claudication. .     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    He will need an aortogram with bilateral runoff with possible intervention. This will be scheduled at the patient's convenience.  He is currently on maximal medical management with ASA, a statin and plavix. He is a former smoker.  Virgina Jock, PA-C [facility] Office: [phone]  11/27/2014, 1:29 PM  This patient was seen and examined in conjunction with Dr. Oneida Alar.    History and exam findings as above. Patient with left hip pain which may be related to iliac artery occlusive disease. He also has evidence of lower extremity arterial occlusive disease on the right leg but not really any symptoms on that side. Arteriogram shows bilateral common femoral occlusive disease with high grade left iliac stenosis.  Plan for bilateral femoral endarterectomy and fem fem today. Risks benefits  possible, occasions and procedure details were discussed with the patient today. He understands and agrees to proceed.  Ruta Hinds, MD [facility] Office: [phone] Pager: [phone]

## 2014-12-31 NOTE — Anesthesia Postprocedure Evaluation (Signed)
  Anesthesia Post-op Note  Patient: [name]  Procedure(s) Performed: Procedure(s): BILATERAL FEMORAL ENDARTERECTOMY (Bilateral) FEMORAL-FEMORAL ARTERY BYPASS GRAFT (Bilateral)  Patient Location: 2  Anesthesia Type:General  Level of Consciousness: awake and alert   Airway and Oxygen Therapy: Patient Spontanous Breathing and Patient connected to nasal cannula oxygen  Post-op Pain: mild  Post-op Assessment: Post-op Vital signs reviewed, Patient's Cardiovascular Status Stable, Respiratory Function Stable and Patent Airway              Post-op Vital Signs: Reviewed and stable  Last Vitals:  Filed Vitals:   12/31/14 0653  BP: 154/44  Pulse: 62  Temp: 36.8 C  Resp: 20    Complications: No apparent anesthesia complications

## 2014-12-31 NOTE — Op Note (Signed)
Procedure: Bilateral common femoral endarterectomy, Right to left femoral-femoral bypass  Preoperative diagnosis: Claudication  Postoperative diagnosis: Same  Anesthesia: Gen.  Assistant: Gerri Lins PA-C  Operative findings: 8 mm Dacron graft  Operative details: After obtaining informed consent, the patient was taken to the operating room. The patient was placed in supine position on theoperating room table. After induction of general anesthesia, a Foley catheter was placed. Next the patient was prepped and draped in the usual sterile fashion from the umbilicus to the toes. A longitudinal incision was made in the right groin. The incision was taken through the subcutaneous tissues down to level of the right common femoral artery. The profunda femoris and superficial femoral artery dissected free circumferentially. The native common femoral artery was also dissected free circumferentially.  This was heavily calcified but did have a pulse within it. Vessel loops were placed around all these. Attention was then turned to the left groin. In similar fashion a longitudinal incision was made in the left groin.  The native common femoral, the profunda femoris and superficial femoral arteries were dissected free circumferentially. Vessel loops were placed around all these. There was minimal pulse in the left common femoral artery.  It was calcified as well but not as much as on the right. Next a subcutaneous tunnel was created over the suprapubic region connecting the left and right groin incisions. An 8 mm Dacron graft was brought through this. The patient was given 8000 units of intravenous heparin. The patient was given an additional 5000 units of heparin during the course of the case. Next the right common femoral artery was controlled with a Cooley clamp. The superficial femoral and profunda femoris arteries were controlled with vessel loops. A longitudinal opening was made in the common femoral artery.   There was a large amount of exophytic calcified plaque obstructing most of the lumen. An endarterectomy was performed over the full length of the common femoral artery. The new 8 mm Dacron graft was spatulated and sewn end of graft to side of the artery using a running 5 0 prolene suture. Just prior to completion of the anastomosis, it was forebled backbled and thoroughly flushed the anastomosis was secured and clamps released and there was good pulsatile flow in the graft immediately. The suture line was slightly loose and this was repaired with a single 5-0 Prolene suture. Hemostasis was obtained. Attention was then turned to the left groin. The native common femoral artery was controlled proximally with a Cooley clamp. The SFA and profunda were controlled with vessel loops.  A longitudinal opening was made in the common femoral artery.  There was again a large amount of calcified exophytic plaque obstructing 80% of the lumen and femoral endarterectomy was performed. The new 8 mm Dacron graft was cut to length and beveled. This was then sewn end of graft to side of native common femoral artery using a running 5-0 Prolene suture. Just prior to completion of the anastomosis it was forebled backbled and thoroughly flushed. the anastomosis was secured; clamps released; and there was pulsatile flow in the graft immediately. The patient had good posterior tibial and dorsalis pedis Doppler signals bilaterally.  There was also good pulsatile flow in the distal superficial femoral artery at the level of the groin. The patient was given 100 mg of protamine for heparin reversal. Hemostasis was obtained. The inguinal ligament was repaired with a running 2-0 Prolene suture on both sides to make sure that there was no femoral hernia. The groin  was then closed in multiple layers using running 2 0 and 3 0 Vicryl suture. The skin of both incisions was closed with a 4 0 Vicryl subcuticular stitch. Dermabond was applied to both  incisions. The patient tolerated procedure well and there were no complications. The patient was extubated in the operating room and taken to recovery in stable condition.  Ruta Hinds, MD [facility] Office: [phone] Pager: [phone]

## 2014-12-31 NOTE — Care Management Note (Signed)
Case Management Note  Patient Details  Name: [name] MRN: [id number] Date of Birth: 1940-11-20  Subjective/Objective:                 From home with wife admitted with claudication s/p Bilateral common femoral endarterectomy, Right to left femoral-femoral bypass. Independent with ADL'S. Uses cane intermittently with ambulation.  Action/Plan: Return to home when medically stable. Awaiting PT/OT evaluations, CM to f/u with d/c disposition needs.  Expected Discharge Date:  01/02/15               Expected Discharge Plan:  Home/Self Care  In-House Referral:     Discharge planning Services  CM Consult  Post Acute Care Choice:    Choice offered to:     DME Arranged:    DME Agency:     HH Arranged:    HH Agency:     Status of Service:  In process, will continue to follow  Medicare Important Message Given:    Date Medicare IM Given:    Medicare IM give by:    Date Additional Medicare IM Given:    Additional Medicare Important Message give by:     If discussed at [place] of Stay Meetings, dates discussed:    Additional Comments: Dewie Ahart (Spouse) [phone]  Whitman Hero [place], [place] [phone] 12/31/2014, 7:46 PM

## 2014-12-31 NOTE — Anesthesia Procedure Notes (Signed)
Procedure Name: Intubation Date/Time: 12/31/2014 8:40 AM Performed by: Luciana Axe K Pre-anesthesia Checklist: Patient identified, Emergency Drugs available, Suction available, Patient being monitored and Timeout performed Patient Re-evaluated:Patient Re-evaluated prior to inductionOxygen Delivery Method: Circle system utilized Preoxygenation: Pre-oxygenation with 100% oxygen Intubation Type: IV induction Ventilation: Mask ventilation without difficulty Laryngoscope Size: Miller and 2 Grade View: Grade I Tube type: Oral Tube size: 7.5 mm Number of attempts: 1 Airway Equipment and Method: Stylet Placement Confirmation: ETT inserted through vocal cords under direct vision,  positive ETCO2,  CO2 detector and breath sounds checked- equal and bilateral Secured at: 22 cm Tube secured with: Tape Dental Injury: Teeth and Oropharynx as per pre-operative assessment

## 2015-01-01 ENCOUNTER — Encounter (HOSPITAL_COMMUNITY): Payer: Self-pay | Admitting: Vascular Surgery

## 2015-01-01 LAB — BASIC METABOLIC PANEL
Anion gap: 6 (ref 5–15)
BUN: 11 mg/dL (ref 6–20)
CALCIUM: 8.3 mg/dL — AB (ref 8.9–10.3)
CO2: 24 mmol/L (ref 22–32)
CREATININE: 0.99 mg/dL (ref 0.61–1.24)
Chloride: 101 mmol/L (ref 101–111)
GFR calc Af Amer: >60 mL/min (ref >60)
GLUCOSE: 135 mg/dL — AB (ref 65–99)
Potassium: 3.9 mmol/L (ref 3.5–5.1)
Sodium: 131 mmol/L — ABNORMAL LOW (ref 135–145)

## 2015-01-01 LAB — POCT I-STAT 4, (NA,K, GLUC, HGB,HCT)
GLUCOSE: 161 mg/dL — AB (ref 65–99)
Glucose, Bld: 167 mg/dL — ABNORMAL HIGH (ref 65–99)
HCT: 20 % — ABNORMAL LOW (ref 39.0–52.0)
HEMATOCRIT: 22 % — AB (ref 39.0–52.0)
HEMOGLOBIN: 7.5 g/dL — AB (ref 13.0–17.0)
Hemoglobin: 6.8 g/dL — CL (ref 13.0–17.0)
POTASSIUM: 3.9 mmol/L (ref 3.5–5.1)
Potassium: 3.9 mmol/L (ref 3.5–5.1)
SODIUM: 135 mmol/L (ref 135–145)
Sodium: 135 mmol/L (ref 135–145)

## 2015-01-01 LAB — CBC
HCT: 24.4 % — ABNORMAL LOW (ref 39.0–52.0)
Hemoglobin: 8.6 g/dL — ABNORMAL LOW (ref 13.0–17.0)
MCH: 31.2 pg (ref 26.0–34.0)
MCHC: 35.2 g/dL (ref 30.0–36.0)
MCV: 88.4 fL (ref 78.0–100.0)
PLATELETS: 126 10*3/uL — AB (ref 150–400)
RBC: 2.76 MIL/uL — ABNORMAL LOW (ref 4.22–5.81)
RDW: 15.8 % — AB (ref 11.5–15.5)
WBC: 7.6 10*3/uL (ref 4.0–10.5)

## 2015-01-01 LAB — GLUCOSE, CAPILLARY
Glucose-Capillary: 114 mg/dL — ABNORMAL HIGH (ref 65–99)
Glucose-Capillary: 191 mg/dL — ABNORMAL HIGH (ref 65–99)
Glucose-Capillary: 174 mg/dL — ABNORMAL HIGH (ref 65–99)
Glucose-Capillary: 123 mg/dL — ABNORMAL HIGH (ref 65–99)

## 2015-01-01 MED ORDER — MENTHOL 3 MG MT LOZG
1.0000 | LOZENGE | OROMUCOSAL | Status: DC | PRN
  Filled 2015-01-01: qty 9

## 2015-01-01 MED ORDER — OXYCODONE HCL 5 MG PO TABS: 5.0000 mg | ORAL_TABLET | Freq: Four times a day (QID) | ORAL | Status: DC | PRN

## 2015-01-01 NOTE — Progress Notes (Signed)
Pt arrived to unit via [place] from RN on 3S.  Pt accompanied by wife.  Attached telebox and alerted CCMD to Pt arrival.  VS stable and Pt assessment performed.  Pt c/o surgical pain, 2/10.  Denies chest pain and SOB.  Pt instructed on use of phone and call light.  Pt oriented to room.  Call placed to dietary to inform them Pt had transferred from 3S to 2W and dietary states they are making his tray and will send Pt's lunch tray to his current room.

## 2015-01-01 NOTE — Progress Notes (Signed)
UR Completed. Jacqulynn Shappell, RN, BSN.  [phone] 

## 2015-01-01 NOTE — Evaluation (Signed)
Occupational Therapy Evaluation Patient Details Name: [name] MRN: [id number] DOB: 16-Apr-1940 Today's Date: 01/01/2015    History of Present Illness [name] is a 74 y.o. (September 21, 1940) male PNHx CAD, MI, PAD, who presents with chief complaint: left hip pain.that comes on with walking and has become function limiting.  Pt s/p Bilateral common femoral endarterectomy, Right to left femoral-femoral bypass   Clinical Impression   Patient evaluated by Occupational Therapy with no further acute OT needs identified. All education has been completed and the patient has no further questions. Pt is able to perform ADLs at supervision level, with anticipation he will progress to modified independent quickly.  All education completed.  See below for any follow-up Occupational Therapy or equipment needs. OT is signing off. Thank you for this referral.      Follow Up Recommendations  No OT follow up;Supervision - Intermittent    Equipment Recommendations  None recommended by OT    Recommendations for Other Services       Precautions / Restrictions Precautions Precautions: None      Mobility Bed Mobility Overal bed mobility: Modified Independent Bed Mobility: Supine to Sit;Sit to Supine     Supine to sit: Supervision Sit to supine: Supervision      Transfers Overall transfer level: Needs assistance   Transfers: Sit to/from Stand;Stand Pivot Transfers Sit to Stand: Supervision Stand pivot transfers: Supervision       General transfer comment: uses hands to assist appropriately    Balance Overall balance assessment: Needs assistance Sitting-balance support: Feet supported Sitting balance-Leahy Scale: Good     Standing balance support: No upper extremity supported Standing balance-Leahy Scale: Fair                              ADL Overall ADL's : Needs assistance/impaired Eating/Feeding: Independent   Grooming: Wash/dry hands;Wash/dry face;Oral  care;Supervision/safety;Standing   Upper Body Bathing: Set up;Sitting   Lower Body Bathing: Supervison/ safety;Sit to/from stand   Upper Body Dressing : Supervision/safety;Sitting   Lower Body Dressing: Supervision/safety;Sit to/from stand   Toilet Transfer: Supervision/safety;Ambulation;Comfort height toilet;Grab bars;RW   Toileting- Clothing Manipulation and Hygiene: Supervision/safety;Sit to/from stand   Tub/ Shower Transfer: Walk-in shower;Supervision/safety;Ambulation;Grab bars;Rolling walker   Functional mobility during ADLs: Supervision/safety;Rolling walker General ADL Comments: Pt able to cross ankles over knees without dificulty to access bil. feet.      Vision     Perception     Praxis      Pertinent Vitals/Pain Pain Assessment: 0-10 Pain Score: 5  Faces Pain Scale: Hurts even more Pain Location: bil groins Pain Descriptors / Indicators: Burning Pain Intervention(s): Monitored during session     Hand Dominance     Extremity/Trunk Assessment Upper Extremity Assessment Upper Extremity Assessment: Overall WFL for tasks assessed   Lower Extremity Assessment Lower Extremity Assessment: Defer to PT evaluation   Cervical / Trunk Assessment Cervical / Trunk Assessment: Normal   Communication Communication Communication: No difficulties   Cognition Arousal/Alertness: Awake/alert Behavior During Therapy: WFL for tasks assessed/performed Overall Cognitive Status: Within Functional Limits for tasks assessed                     General Comments       Exercises       Shoulder Instructions      Home Living Family/patient expects to be discharged to:: Private residence Living Arrangements: Spouse/significant other Available Help at Discharge: Family;Available 24 hours/day  Type of Home: House Home Access: Stairs to enter [employer] of Steps: 5 Entrance Stairs-Rails: None Home Layout: One level (half basement garage)     Bathroom  Shower/Tub: Occupational psychologist: Standard     Home Equipment: Cane - single point;Grab bars - tub/shower          Prior Functioning/Environment Level of Independence: Independent;Independent with assistive device(s)        Comments: occasionally uses a cane    OT Diagnosis: Acute pain   OT Problem List:     OT Treatment/Interventions:      OT Goals(Current goals can be found in the care plan section) Acute Rehab OT Goals Patient Stated Goal: to go home  OT Goal Formulation: All assessment and education complete, DC therapy  OT Frequency:     Barriers to D/C:            Co-evaluation              End of Session Equipment Utilized During Treatment: Rolling walker Nurse Communication: Mobility status  Activity Tolerance: Patient tolerated treatment well Patient left: in bed;with call bell/phone within reach;with family/visitor present   Time: 1310-1326 OT Time Calculation (min): 16 min Charges:  OT General Charges $OT Visit: 1 Procedure OT Evaluation $Initial OT Evaluation Tier I: 1 Procedure G-Codes:    Delilah Mulgrew M Jan 22, 2015, 1:33 PM

## 2015-01-01 NOTE — Progress Notes (Addendum)
  Progress Note    01/01/2015 7:41 AM 1 Day Post-Op  Subjective:  No complaints  Tm 101.1 now 100.5 HR  70's-80's NSR 759'F-638'G systolic 66% RA  Filed Vitals:   01/01/15 0417  BP: 109/39  Pulse: 79  Temp: 100.5 F (38.1 C)  Resp: 20    Physical Exam: Cardiac:  regular Lungs:  Non labored Incisions:  Bilateral groins are c/d/i and soft without hematoma Extremities:  Brisk doppler signals bilateral DP/PT   CBC    Component Value Date/Time   WBC 7.6 01/01/2015 0455   RBC 2.76* 01/01/2015 0455   RBC 2.89* 07/27/2014 0550   HGB 8.6* 01/01/2015 0455   HCT 24.4* 01/01/2015 0455   PLT 126* 01/01/2015 0455   MCV 88.4 01/01/2015 0455   MCH 31.2 01/01/2015 0455   MCHC 35.2 01/01/2015 0455   RDW 15.8* 01/01/2015 0455   LYMPHSABS 1.0 12/17/2014 1607   MONOABS 0.8 12/17/2014 1607   EOSABS 0.1 12/17/2014 1607   BASOSABS 0.0 12/17/2014 1607    BMET    Component Value Date/Time   NA 131* 01/01/2015 0455   K 3.9 01/01/2015 0455   CL 101 01/01/2015 0455   CO2 24 01/01/2015 0455   GLUCOSE 135* 01/01/2015 0455   BUN 11 01/01/2015 0455   CREATININE 0.99 01/01/2015 0455   CREATININE 1.02 12/17/2014 1157   CALCIUM 8.3* 01/01/2015 0455   GFRNONAA >60 01/01/2015 0455   GFRAA >60 01/01/2015 0455    INR    Component Value Date/Time   INR 1.41 12/25/2014 1257     Intake/Output Summary (Last 24 hours) at 01/01/15 0741 Last data filed at 01/01/15 0400  Gross per 24 hour  Intake   4210 ml  Output   2450 ml  Net   1760 ml     Assessment:  74 y.o. male is s/p:  Bilateral common femoral endarterectomy, Right to left femoral-femoral bypass  1 Day Post-Op  Plan: -pt doing well this am with brisk DP/PT bilaterally and warm feet bilaterally -foley out earlier this morning-he has not voided yet -pt with fever earlier this am-most likely atelectasis.  IS to pt's room and 10x/hr -acute surgical blood loss anemia-received 2 PRBC's yesterday -he has not mobilized  yet -needs to be OOB to chair and mobilizing -discussed groin wound care and the importance of keeping groins dry-he expressed understanding -if ambulating, voiding and pain controlled-possibly home in next day or two -DVT prophylaxis:  Lovenox to start this am -pt is on statin and aspirin   Leontine Locket, PA-C [facility] [phone] 01/01/2015 7:41 AM   Some groin soreness Bilateral feet warm Incisions clean without hematoma Blood loss anemia baseline hemoglobin around 9.  Continue to follow for now unless symptomatic Ambulate, transfer 2w Home next 1-2 days  Ruta Hinds, MD [facility] [place]: [phone] Pager: [phone]

## 2015-01-01 NOTE — Progress Notes (Addendum)
Physical Therapy Evaluation Patient Details Name: [name] MRN: [id number] DOB: [birth year] Today's Date: 01/01/2015   History of Present Illness  [name] is a 74 y.o. ([birth year]) male PNHx CAD, MI, PAD, who presents with chief complaint: left hip pain.that comes on with walking and has become function limiting.  Pt s/p Bilateral common femoral endarterectomy, Right to left femoral-femoral bypass  Clinical Impression  Pt admitted with/for endarterectomies and BPG stated above.  Pt currently limited functionally due to the problems listed below.  (see problems list.)  Pt will benefit from PT to maximize function and safety to be able to get home safely with available assist of family.     Follow Up Recommendations No PT follow up    Equipment Recommendations  None recommended by PT    Recommendations for Other Services       Precautions / Restrictions Precautions Precautions: None      Mobility  Bed Mobility Overal bed mobility: Needs Assistance Bed Mobility: Supine to Sit;Sit to Supine     Supine to sit: Supervision Sit to supine: Supervision      Transfers Overall transfer level: Needs assistance   Transfers: Sit to/from Stand Sit to Stand: Supervision         General transfer comment: uses hands to assist appropriately  Ambulation/Gait Ambulation/Gait assistance: Supervision Ambulation Distance (Feet): 250 Feet Assistive device: Rolling walker (2 wheeled) Gait Pattern/deviations: Step-through pattern   Gait velocity interpretation: at or above normal speed for age/gender General Gait Details: steady gait, no sign of pain in gait.  Stairs            Wheelchair Mobility    Modified Rankin (Stroke Patients Only)       Balance Overall balance assessment: Needs assistance Sitting-balance support: No upper extremity supported Sitting balance-Leahy Scale: Good     Standing balance support: No upper extremity supported Standing  balance-Leahy Scale: Fair                               Pertinent Vitals/Pain Pain Assessment: Faces Faces Pain Scale: Hurts even more Pain Location: R and L groins Pain Descriptors / Indicators: Burning;Grimacing Pain Intervention(s): Monitored during session;Premedicated before session    Home Living Family/patient expects to be discharged to:: Private residence Living Arrangements: Spouse/significant other Available Help at Discharge: Family;Available 24 hours/day Type of Home: House Home Access: Stairs to enter Entrance Stairs-Rails: None Entrance Stairs-Number of Steps: 5 Home Layout: One level (half basement garage) Home Equipment: Cane - single point      Prior Function Level of Independence: Independent;Independent with assistive device(s)         Comments: occasionally uses a cane     Hand Dominance        Extremity/Trunk Assessment   Upper Extremity Assessment: Defer to OT evaluation           Lower Extremity Assessment: Overall WFL for tasks assessed (limited MMT due to proximal pain)      Cervical / Trunk Assessment: Normal  Communication   Communication: No difficulties  Cognition Arousal/Alertness: Awake/alert Behavior During Therapy: WFL for tasks assessed/performed Overall Cognitive Status: Within Functional Limits for tasks assessed                      General Comments General comments (skin integrity, edema, etc.): VSS    Exercises        Assessment/Plan    PT  Assessment Patient needs continued PT services  PT Diagnosis Acute pain   PT Problem List Decreased mobility;Decreased knowledge of use of DME;Decreased skin integrity;Decreased strength  PT Treatment Interventions DME instruction;Gait training;Stair training;Functional mobility training;Therapeutic activities;Patient/family education   PT Goals (Current goals can be found in the Care Plan section) Acute Rehab PT Goals Patient Stated Goal: home, back  to doing more activity PT Goal Formulation: With patient Time For Goal Achievement: 01/08/15 Potential to Achieve Goals: Good    Frequency Min 3X/week   Barriers to discharge        Co-evaluation               End of Session   Activity Tolerance: Patient tolerated treatment well Patient left: in bed;with call bell/phone within reach;with family/visitor present Nurse Communication: Mobility status         Time: 1153-1209 PT Time Calculation (min) (ACUTE ONLY): 16 min   Charges:   PT Evaluation $Initial PT Evaluation Tier I: 1 Procedure     PT G Codes:        [name], Tessie Fass 01/01/2015, 1:14 PM 01/01/2015  Donnella Sham, PT [phone]  (pager)

## 2015-01-02 ENCOUNTER — Encounter (HOSPITAL_COMMUNITY): Payer: Medicare Other

## 2015-01-02 LAB — GLUCOSE, CAPILLARY
GLUCOSE-CAPILLARY: 130 mg/dL — AB (ref 65–99)
GLUCOSE-CAPILLARY: 157 mg/dL — AB (ref 65–99)
Glucose-Capillary: 149 mg/dL — ABNORMAL HIGH (ref 65–99)
Glucose-Capillary: 213 mg/dL — ABNORMAL HIGH (ref 65–99)

## 2015-01-02 NOTE — Care Management Note (Signed)
Case Management Note  Patient Details  Name: [name] MRN: [id number] Date of Birth: 06/12/1940  Subjective/Objective:     Pt s/p fem bypass graft               Action/Plan:  Pt is independent from home with wife.  CM will continue to follow for disposition needs   Expected Discharge Date:  01/02/15               Expected Discharge Plan:  Home/Self Care  In-House Referral:     Discharge planning Services  CM Consult  Post Acute Care Choice:    Choice offered to:     DME Arranged:   [facility] DME Agency:   [facility]  HH Arranged:    [place] Agency:     Status of Service:  In process, will continue to follow  Medicare Important Message Given:  Yes-second notification given Date Medicare IM Given:    Medicare IM give by:    Date Additional Medicare IM Given:    Additional Medicare Important Message give by:     If discussed at [place] of Stay Meetings, dates discussed:    Additional Comments: CM assessed pt.  Pt requested RW and order has been written.  CM offered choice, pt chose [facility], agency contacted and referral accepted. Maryclare Labrador, RN 01/02/2015, 1:44 PM

## 2015-01-02 NOTE — Progress Notes (Signed)
Physical Therapy Treatment Patient Details Name: [name] MRN: [id number] DOB: [birth year] Today's Date: 01/02/2015    History of Present Illness [name] is a 74 y.o. (1941/03/15) male PNHx CAD, MI, PAD, who presents with chief complaint: left hip pain.that comes on with walking and has become function limiting.  Pt s/p Bilateral common femoral endarterectomy, Right to left femoral-femoral bypass    PT Comments    Pt progressing towards physical therapy goals. Was able to negotiate stairs this session with HHA for safety. Pt and family were educated on use of SPC and HHA to enter home safely, as there are no hand rails available. Will continue to follow and progress as able per POC.   Follow Up Recommendations  No PT follow up     Equipment Recommendations  None recommended by PT    Recommendations for Other Services       Precautions / Restrictions Precautions Precautions: Fall Restrictions Weight Bearing Restrictions: No    Mobility  Bed Mobility Overal bed mobility: Modified Independent Bed Mobility: Supine to Sit           General bed mobility comments: No physical assist required. HOB flat and rails down  Transfers Overall transfer level: Needs assistance Equipment used: None Transfers: Sit to/from Stand Sit to Stand: Supervision         General transfer comment: Proper hand placement and safety awareness. Supervision for safety.   Ambulation/Gait Ambulation/Gait assistance: Supervision;Min guard Ambulation Distance (Feet): 300 Feet Assistive device: None Gait Pattern/deviations: Step-through pattern;Decreased stride length;Wide base of support Gait velocity: Decreased Gait velocity interpretation: Below normal speed for age/gender General Gait Details: Decreased knee flexion during swing through bilaterally. No physical assist required however occasional min guard was provided for safety.    Stairs Stairs: Yes Stairs assistance: Min  assist   Number of Stairs: 10 General stair comments: A couple minor losses of balance when attempting to ascend stairs without rails. Pt was provided HHA for increased stability, and it was recommended to pt and family that he use SPC with one hand and HHA with the other while ascending stairs into home.   Wheelchair Mobility    Modified Rankin (Stroke Patients Only)       Balance Overall balance assessment: Needs assistance Sitting-balance support: Feet supported;No upper extremity supported Sitting balance-Leahy Scale: Good     Standing balance support: No upper extremity supported Standing balance-Leahy Scale: Fair                      Cognition Arousal/Alertness: Awake/alert Behavior During Therapy: WFL for tasks assessed/performed Overall Cognitive Status: Within Functional Limits for tasks assessed                      Exercises      General Comments        Pertinent Vitals/Pain Pain Assessment: 0-10 Pain Score: 4  Pain Location: B groins Pain Descriptors / Indicators: Operative site guarding Pain Intervention(s): Limited activity within patient's tolerance;Monitored during session;Repositioned    Home Living                      Prior Function            PT Goals (current goals can now be found in the care plan section) Acute Rehab PT Goals Patient Stated Goal: to go home  PT Goal Formulation: With patient Time For Goal Achievement: 01/08/15 Potential to Achieve Goals: Good  Progress towards PT goals: Progressing toward goals    Frequency  Min 3X/week    PT Plan Current plan remains appropriate    Co-evaluation             End of Session Equipment Utilized During Treatment: Gait belt Activity Tolerance: Patient tolerated treatment well Patient left: with call bell/phone within reach;with family/visitor present;in chair     Time: 1150-1210 PT Time Calculation (min) (ACUTE ONLY): 20 min  Charges:  $Gait  Training: 8-22 mins                    G Codes:      Rolinda Roan 2015/01/10, 12:42 PM   Rolinda Roan, PT, DPT Acute Rehabilitation Services Pager: [phone]

## 2015-01-02 NOTE — Discharge Summary (Signed)
Discharge Summary     [name] August 31, 1940 74 y.o. 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 male who presents with chief complaint: left hip pain. Onset of symptom occurred 1 year ago. This pain occurs after walking one block. Sometimes he is able to walk two blocks before his hip hurts. He never has pain at rest. Rest relieves the pain. This pain limits him from walking and working in the yard. He used to be more active and walk 30 mins daily. He denies any rest pain or non healing wounds. He denies any symptoms in his right leg. The patient reports an accident where he fell onto his left hip 4 years ago from a 10 foot ladder. He believes this is unassociated with his current pain because he was asymptomatic for three years following the accident. Atherosclerotic risk factors include: history of CAD s/p redo CABG in 2005, hypertension, hyperlipidemia, diabetes mellitus and former smoker.   Hospital Course:  The patient was admitted to the hospital and taken to the operating room on 12/31/2014 and underwent: Bilateral common femoral endarterectomy, Right to left femoral-femoral bypass.    The pt tolerated the procedure well and was transported to the PACU in good condition.   By POD 1, he was doing well with brisk doppler signals.  He was transferred to the telemetry floor that day.  On POD 2, he had ambulated and discharged home.  Groin wound care was discussed extensively with the pt.  The remainder of the hospital course consisted of increasing mobilization and increasing intake of solids without difficulty.  CBC    Component Value Date/Time   WBC 7.6 01/01/2015 0455   RBC 2.76* 01/01/2015 0455   RBC 2.89* 07/27/2014 0550   HGB 8.6* 01/01/2015 0455   HCT 24.4* 01/01/2015 0455   PLT  126* 01/01/2015 0455   MCV 88.4 01/01/2015 0455   MCH 31.2 01/01/2015 0455   MCHC 35.2 01/01/2015 0455   RDW 15.8* 01/01/2015 0455   LYMPHSABS 1.0 12/17/2014 1607   MONOABS 0.8 12/17/2014 1607   EOSABS 0.1 12/17/2014 1607   BASOSABS 0.0 12/17/2014 1607    BMET    Component Value Date/Time   NA 131* 01/01/2015 0455   K 3.9 01/01/2015 0455   CL 101 01/01/2015 0455   CO2 24 01/01/2015 0455   GLUCOSE 135* 01/01/2015 0455   BUN 11 01/01/2015 0455   CREATININE 0.99 01/01/2015 0455   CREATININE 1.02 12/17/2014 1157   CALCIUM 8.3* 01/01/2015 0455   GFRNONAA >60 01/01/2015 0455   GFRAA >60 01/01/2015 0455       Discharge Instructions    Call MD for:  redness, tenderness, or signs of infection (pain, swelling, bleeding, redness, odor or green/yellow discharge around incision site)    Complete by:  As directed      Call MD for:  severe or increased pain, loss or decreased feeling  in affected limb(s)    Complete by:  As directed      Call MD for:  temperature >100.5    Complete by:  As directed      Discharge wound care:    Complete by:  As directed   Wash the groin wound with soap and water daily and pat dry. (No tub bath-only shower)  Then put a dry gauze  or washcloth there to keep this area dry daily and as needed to help prevent wound infection.  Do not use Vaseline or neosporin on your incisions.  Only use soap and water on your incisions and then protect and keep dry.     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 Abnormal nuclear stress test 12/23/2014  . PAD (peripheral artery disease) ([place]) 12/11/2014  . Acute  diastolic CHF (congestive heart failure) ([place]) 09/01/2014  . Acute on chronic diastolic CHF (congestive heart failure), NYHA class 1 ([place]) 08/09/2014  . CAP (community acquired pneumonia) 07/29/2014  . Hypokalemia 07/29/2014  . SBO (small bowel obstruction) ([place]) 07/25/2014  . Partial small bowel obstruction ([place]) 07/25/2014  . 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 CAD (coronary artery disease)   . History of acute inferior wall MI   . Diabetes mellitus ([place])   . HTN (hypertension)   . Dyslipidemia    Past Medical History  Diagnosis Date  . CAD (coronary artery disease)   . History of acute inferior wall MI   . Diabetes mellitus   . HTN (hypertension)   . Dyslipidemia   . 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 BPH (benign prostatic hyperplasia)   . 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 Hyponatremia   . Acute on chronic diastolic CHF (congestive heart failure), NYHA class 1 ([place]) 08/09/2014  . PAD (peripheral artery disease) ([place])   . Myocardial infarction [facility])     x2  1986, 2005       Medication List    TAKE these medications        ALPRAZolam 0.5 MG tablet  Commonly known as:  XANAX  Take 0.5 mg by mouth 3 (three) times daily as needed for anxiety.     amLODipine 5 MG tablet  Commonly known as:  NORVASC  Take 1 tablet (5 mg total) by mouth daily.     aspirin 81 MG tablet  Take 81 mg by mouth daily.     carvedilol 12.5 MG tablet  Commonly known as:  COREG  TAKE 1 TABLET TWICE A DAY     clopidogrel 75 MG tablet  Commonly known as:  PLAVIX  Take 1 tablet (75 mg total) by mouth daily.     glimepiride 4 MG tablet  Commonly known as:  AMARYL  Take 4 mg by mouth daily before breakfast.     iron polysaccharides 150 MG capsule  Commonly known as:  NIFEREX  Take 150 mg by mouth daily.     losartan 100 MG tablet  Commonly known as:  COZAAR  Take 100 mg by mouth daily.     nitroGLYCERIN 0.4 MG SL tablet  Commonly known as:  NITROSTAT  Place 1  tablet (0.4 mg total) under the tongue every 5 (five) minutes as needed for chest pain.     oxyCODONE 5 MG immediate release tablet  Commonly known as:  ROXICODONE  Take 1 tablet (5 mg total) by mouth every  6 (six) hours as needed.     pioglitazone 45 MG tablet  Commonly known as:  ACTOS  Take 45 mg by mouth daily.     simvastatin 20 MG tablet  Commonly known as:  ZOCOR  Take 1 tablet (20 mg total) by mouth at bedtime.        Prescriptions given: 1.  Roxicodone #30 No Refill  Instructions: 1.  Wash the groin wound with soap and water daily and pat dry. (No tub bath-only shower)  Then put a dry gauze or washcloth there to keep this area dry daily and as needed.  Do not use Vaseline or neosporin on your incisions.  Only use soap and water on your incisions and then protect and keep dry.  Disposition: home  Patient's condition: is Good  Follow up: 1. Dr. Oneida Alar in 2 weeks   Leontine Locket, PA-C [facility] [phone] 01/02/2015  7:43 AM  - For VQI Registry use --- Instructions: Press F2 to tab through selections.  Delete question if not applicable.   Post-op:  Wound infection: No  Graft infection: No  Transfusion: No  If yes, n/a units given New Arrhythmia: No Ipsilateral amputation: No, [ ]  Minor, [ ]  BKA, [ ]  AKA Discharge patency: [x]  Primary, [ ]  Primary assisted, [ ]  Secondary, [ ]  Occluded Patency judged by: [x ] Dopper only, [ ]  Palpable graft pulse, [ ]  Palpable distal pulse, [ ]  ABI inc. > 0.15, [ ]  Duplex Discharge ABI: R not done, L  Discharge TBI: R , L  D/C Ambulatory Status: Ambulatory  Complications: MI: No, [ ]  Troponin only, [ ]  EKG or Clinical CHF: No Resp failure:No, [ ]  Pneumonia, [ ]  Ventilator Chg in renal function: No, [ ]  Inc. Cr > 0.5, [ ]  Temp. Dialysis, [ ]  Permanent dialysis Stroke: No, [ ]  Minor, [ ]  Major Return to OR: No  Reason for return to OR: [ ]  Bleeding, [ ]  Infection, [ ]  Thrombosis, [ ]   Revision  Discharge medications: Statin use:  yes ASA use:  yes Plavix use:  yes Beta blocker use: no Coumadin use: no

## 2015-01-02 NOTE — Progress Notes (Addendum)
  Progress Note    01/02/2015 7:37 AM 2 Days Post-Op  Subjective:  Pain is getting less  Tm 99.9 now 98.8 HR 40'J-81'X NSR 914'N systolic 82% RA  Filed Vitals:   01/02/15 0530  BP: 109/34  Pulse: 80  Temp: 98.8 F (37.1 C)  Resp: 18    Physical Exam: Lungs:  Non labored Incisions:  Bilateral groins are soft without hematoma Extremities:  Bilateral feet are warm & well perfused.   CBC    Component Value Date/Time   WBC 7.6 01/01/2015 0455   RBC 2.76* 01/01/2015 0455   RBC 2.89* 07/27/2014 0550   HGB 8.6* 01/01/2015 0455   HCT 24.4* 01/01/2015 0455   PLT 126* 01/01/2015 0455   MCV 88.4 01/01/2015 0455   MCH 31.2 01/01/2015 0455   MCHC 35.2 01/01/2015 0455   RDW 15.8* 01/01/2015 0455   LYMPHSABS 1.0 12/17/2014 1607   MONOABS 0.8 12/17/2014 1607   EOSABS 0.1 12/17/2014 1607   BASOSABS 0.0 12/17/2014 1607    BMET    Component Value Date/Time   NA 131* 01/01/2015 0455   K 3.9 01/01/2015 0455   CL 101 01/01/2015 0455   CO2 24 01/01/2015 0455   GLUCOSE 135* 01/01/2015 0455   BUN 11 01/01/2015 0455   CREATININE 0.99 01/01/2015 0455   CREATININE 1.02 12/17/2014 1157   CALCIUM 8.3* 01/01/2015 0455   GFRNONAA >60 01/01/2015 0455   GFRAA >60 01/01/2015 0455    INR    Component Value Date/Time   INR 1.41 12/25/2014 1257     Intake/Output Summary (Last 24 hours) at 01/02/15 0737 Last data filed at 01/01/15 2103  Gross per 24 hour  Intake    480 ml  Output      0 ml  Net    480 ml     Assessment:  74 y.o. male is s/p:  Bilateral common femoral endarterectomy, Right to left femoral-femoral bypass   2 Days Post-Op  Plan: -pt doing well  -walked 3x with walker yesterday-going to walk today after breakfast -home today -DVT prophylaxis:  Lovenox -f/u with Dr. Oneida Alar in 2 weeks   Leontine Locket, PA-C [facility] [phone] 01/02/2015 7:37 AM  Agree with above exam findings Discussed situation with pt and wife.  He would  like one more day to work on ambulation and groin soreness.  Plan d/c tomorrow morning  Ruta Hinds, MD [facility] [place] Office: [phone] Pager: [phone]

## 2015-01-02 NOTE — Care Management Important Message (Signed)
Important Message  Patient Details  Name: [name] MRN: [id number] Date of Birth: [birth year]   Medicare Important Message Given:  Yes-second notification given    [name] May 01/02/2015, 12:02 PM

## 2015-01-03 DIAGNOSIS — I70212 Atherosclerosis of native arteries of extremities with intermittent claudication, left leg: Secondary | ICD-10-CM | POA: Diagnosis not present

## 2015-01-03 LAB — GLUCOSE, CAPILLARY: Glucose-Capillary: 134 mg/dL — ABNORMAL HIGH (ref 65–99)

## 2015-01-03 MED ORDER — INFLUENZA VAC SPLIT QUAD 0.5 ML IM SUSY
0.5000 mL | PREFILLED_SYRINGE | Freq: Once | INTRAMUSCULAR | Status: AC
  Administered 2015-01-03: 0.5 mL via INTRAMUSCULAR
  Filled 2015-01-03: qty 0.5

## 2015-01-03 NOTE — Progress Notes (Signed)
Discharged to home with family office visits in place teaching done  

## 2015-01-03 NOTE — Progress Notes (Signed)
Patient laying in bed, no distress or pain. Wife at bedside, call light within reach.

## 2015-01-03 NOTE — Progress Notes (Addendum)
[facility] [place]  Subjective  - Doing well.  Ready to go home.   Objective 107/39 76 99.7 F (37.6 C) (Oral) 18 98%  Intake/Output Summary (Last 24 hours) at 01/03/15 0855 Last data filed at 01/03/15 0839  Gross per 24 hour  Intake    600 ml  Output      0 ml  Net    600 ml    Incisions healing well without hematoma Feet warm and well perfused Lungs non labored breathing  Assessment/Planning: POD # 3 Bilateral common femoral endarterectomy, Right to left femoral-femoral bypass   Ambulating , taking PO's well and has had BM -f/u with Dr. Oneida Alar in 2 weeks  Theda Sers [place] 01/03/2015 8:55 AM --  Laboratory Lab Results:  Recent Labs  12/31/14 1710 01/01/15 0455  WBC 8.1 7.6  HGB 9.4* 8.6*  HCT 27.4* 24.4*  PLT 148* 126*   BMET  Recent Labs  12/31/14 1226 12/31/14 1710 01/01/15 0455  NA 135  --  131*  K 3.9  --  3.9  CL  --   --  101  CO2  --   --  24  GLUCOSE 167*  --  135*  BUN  --   --  11  CREATININE  --  0.87 0.99  CALCIUM  --   --  8.3*    COAG Lab Results  Component Value Date   INR 1.41 12/25/2014   INR 1.21 12/17/2014   INR 1.05 02/17/2012   No results found for: PTT

## 2015-01-04 LAB — TYPE AND SCREEN
ABO/RH(D): A POS
Antibody Screen: NEGATIVE
UNIT DIVISION: 0
UNIT DIVISION: 0
Unit division: 0
Unit division: 0

## 2015-01-05 ENCOUNTER — Telehealth: Payer: Self-pay | Admitting: Vascular Surgery

## 2015-01-05 NOTE — Telephone Encounter (Addendum)
-----   Message from Gabriel Earing, [place] sent at 01/01/2015  7:51 AM EDT ----- S/p bilateral femoral endarterectomy; fem-fem bypass 12/31/14.  F/u with [facility] in 2 weeks.  Thanks Anderson  notified patient of post op appt. on 01-14-15 at 1:00 for lab and then to see dr. Oneida Alar

## 2015-01-13 ENCOUNTER — Encounter: Payer: Self-pay | Admitting: Vascular Surgery

## 2015-01-13 ENCOUNTER — Other Ambulatory Visit: Payer: Self-pay | Admitting: *Deleted

## 2015-01-13 DIAGNOSIS — I739 Peripheral vascular disease, unspecified: Secondary | ICD-10-CM

## 2015-01-13 DIAGNOSIS — Z48812 Encounter for surgical aftercare following surgery on the circulatory system: Secondary | ICD-10-CM

## 2015-01-14 ENCOUNTER — Encounter: Payer: Self-pay | Admitting: Vascular Surgery

## 2015-01-14 ENCOUNTER — Ambulatory Visit (INDEPENDENT_AMBULATORY_CARE_PROVIDER_SITE_OTHER): Payer: Medicare Other | Admitting: Vascular Surgery

## 2015-01-14 ENCOUNTER — Encounter: Payer: Medicare Other | Admitting: Vascular Surgery

## 2015-01-14 ENCOUNTER — Ambulatory Visit (HOSPITAL_COMMUNITY)
Admission: RE | Admit: 2015-01-14 | Discharge: 2015-01-14 | Disposition: A | Discharge status: Home / Self Care | Payer: Medicare Other | Source: Ambulatory Visit | Attending: Vascular Surgery | Admitting: Vascular Surgery

## 2015-01-14 VITALS — BP 140/59 | HR 66 | Temp 98.1°F | Resp 16 | Ht 69.0 in | Wt 172.0 lb

## 2015-01-14 DIAGNOSIS — I2510 Atherosclerotic heart disease of native coronary artery without angina pectoris: Secondary | ICD-10-CM | POA: Insufficient documentation

## 2015-01-14 DIAGNOSIS — E119 Type 2 diabetes mellitus without complications: Secondary | ICD-10-CM | POA: Insufficient documentation

## 2015-01-14 DIAGNOSIS — E785 Hyperlipidemia, unspecified: Secondary | ICD-10-CM | POA: Diagnosis not present

## 2015-01-14 DIAGNOSIS — Z48812 Encounter for surgical aftercare following surgery on the circulatory system: Secondary | ICD-10-CM

## 2015-01-14 DIAGNOSIS — I739 Peripheral vascular disease, unspecified: Secondary | ICD-10-CM

## 2015-01-14 DIAGNOSIS — I10 Essential (primary) hypertension: Secondary | ICD-10-CM | POA: Insufficient documentation

## 2015-01-14 NOTE — Progress Notes (Signed)
    Postoperative Visit   History of Present Illness  [name] is a 74 y.o. year old male who presents for postoperative follow-up for: right to left femoral-femoral bypass (Date: 12/31/14). The patient notes improvement of left hip claudication. He is now able to walk 30 minutes daily versus only from his house to the car. He denies any significant pain.   For VQI Use Only  PRE-ADM LIVING: Home  AMB STATUS: Ambulatory  Physical Examination  Filed Vitals:   01/14/15 1414  BP: 140/59  Pulse: 66  Temp:   Resp:    Easily palpable fem-fem graft pulse Left groin incision healing well Right groin incision with maceration at distal aspect  Non palpable pedal pulses.   ABIs (01/14/15) Non compressible vessels Biphasic right  posterior tibial and anterior tibial Monophasic left posterior tibial and anterior tibial   Medical Decision Making  [name] is a 74 y.o. year old male who presents s/p right to left femoral-femoral bypass (Date: 12/31/14)  The patient's bypass incisions are healing appropriately except for some maceration at the distal right groin incision.  Advised the patient to keep incisions clean and dry and to apply gauze to wick moisture.   His claudication symptoms have significantly improved and he is now able to walk 30 minutes daily.  Follow up in 3 weeks for evaluate right groin incision  Virgina Jock, PA-C [facility] [place]: [phone]  01/14/2015, 2:38 PM   This patient was seen and examined in conjunction with Dr. Oneida Alar  History and exam findings as above. Some maceration of the inferior 2 cm of the right groin incision. No obvious deep tissue involvement. Patient will place a dry dressing in the right groin and wash once daily with soap and water. He'll return in 3 weeks to make sure that his groin incision is completely healed. He has an easily palpable graft pulse. His claudication symptoms are  resolved.  Ruta Hinds, MD [facility] [place] Office: [phone] Pager: [phone]

## 2015-01-14 NOTE — Progress Notes (Signed)
Filed Vitals:   01/14/15 1409 01/14/15 1414  BP: 151/56 140/59  Pulse: 68 66  Temp: 98.1 F (36.7 C)   TempSrc: Oral   Resp: 16   Height: 5\' 9"  (1.753 m)   Weight: 172 lb (78.019 kg)   SpO2: 100%

## 2015-02-10 ENCOUNTER — Encounter: Payer: Self-pay | Admitting: Vascular Surgery

## 2015-02-12 ENCOUNTER — Encounter: Payer: Self-pay | Admitting: Vascular Surgery

## 2015-02-12 ENCOUNTER — Ambulatory Visit (INDEPENDENT_AMBULATORY_CARE_PROVIDER_SITE_OTHER): Payer: Medicare Other | Admitting: Vascular Surgery

## 2015-02-12 VITALS — BP 139/62 | HR 69 | Temp 97.8°F | Resp 16 | Ht 69.0 in | Wt 178.0 lb

## 2015-02-12 DIAGNOSIS — Z95828 Presence of other vascular implants and grafts: Secondary | ICD-10-CM

## 2015-02-12 NOTE — Progress Notes (Signed)
POST OPERATIVE OFFICE NOTE    CC:  F/u for surgery  HPI:  This is a 74 y.o. male who is s/p bilateral common femoral endarterectomy and right to left femoral - femoral bypass grafting on 12/31/14.  He returns today for wound check.  He states the left groin is doing well but the right groin is not healing like the right groin.  He has been using the antibacterial soap and a bandaid on the wound.  He states his claudication symptoms are better.  Allergies  Allergen Reactions  . Hctz [Hydrochlorothiazide] Other (See Comments)    Hyponatremia Low potassium     Current Outpatient Prescriptions  Medication Sig Dispense Refill  . ALPRAZolam (XANAX) 0.5 MG tablet Take 0.5 mg by mouth 3 (three) times daily as needed for anxiety.     Marland Kitchen amLODipine (NORVASC) 5 MG tablet Take 1 tablet (5 mg total) by mouth daily. 90 tablet 0  . aspirin 81 MG tablet Take 81 mg by mouth daily.      . carvedilol (COREG) 12.5 MG tablet TAKE 1 TABLET TWICE A DAY 180 tablet 2  . clopidogrel (PLAVIX) 75 MG tablet Take 1 tablet (75 mg total) by mouth daily. 90 tablet 2  . glimepiride (AMARYL) 4 MG tablet Take 4 mg by mouth daily before breakfast.     . iron polysaccharides (NIFEREX) 150 MG capsule Take 150 mg by mouth daily.    . nitroGLYCERIN (NITROSTAT) 0.4 MG SL tablet Place 1 tablet (0.4 mg total) under the tongue every 5 (five) minutes as needed for chest pain. 25 tablet 3  . pioglitazone (ACTOS) 45 MG tablet Take 45 mg by mouth daily.     . simvastatin (ZOCOR) 20 MG tablet Take 1 tablet (20 mg total) by mouth at bedtime. 90 tablet 3  . losartan (COZAAR) 100 MG tablet Take 100 mg by mouth daily.     Marland Kitchen oxyCODONE (ROXICODONE) 5 MG immediate release tablet Take 1 tablet (5 mg total) by mouth every 6 (six) hours as needed. (Patient not taking: Reported on 02/12/2015) 30 tablet 0   No current facility-administered medications for this visit.     ROS:  See HPI  Physical Exam:  Filed Vitals:   02/12/15 1443  BP:  139/62  Pulse: 69  Temp: 97.8 F (36.6 C)  Resp: 16    Incision:  Left groin incision is healing nicely.  Right groin wound with ~ 1cm separation with yellow fibrinous tissue present Extremities:  + palpable femoral-femoral bypass graft   Assessment/Plan:  This is a 74 y.o. male who is s/p: Bilateral common femoral endarterectomy with right to left femoral to femoral bypass grafting 12/31/14 by Dr. Oneida Alar  -patent femoral to femoral bypass graft (palpable) -yellow fibrinous exudate debrided from right groin today and redressed with wet to dry dressing.  Advised pt to try for bid dressing changes, but must be done at least once a day. -he will f/u in 3 weeks for re-check of his wound. -pt is on a statin/aspirin/plavix  Leontine Locket, PA-C [facility] [phone]  Clinic MD:  Pt seen and examined with Dr. Oneida Alar  History and exam details as above. Patient has palpable femorofemoral pulse. His walking distance has improved significantly. He still has an open wound at the inferior aspect of his right groin incision. This was superficially debrided today. There is no exposed graft in the wound does not penetrate very deep hopefully this will heal in a few weeks. He'll do  normal saline wet to dry dressings andcome back to see me in a few weeks.  Ruta Hinds, MD [facility] [place] Office: [phone] Pager: [phone]

## 2015-02-13 ENCOUNTER — Ambulatory Visit (INDEPENDENT_AMBULATORY_CARE_PROVIDER_SITE_OTHER): Payer: Medicare Other | Admitting: Urology

## 2015-02-13 DIAGNOSIS — R351 Nocturia: Secondary | ICD-10-CM | POA: Diagnosis not present

## 2015-02-13 DIAGNOSIS — N401 Enlarged prostate with lower urinary tract symptoms: Secondary | ICD-10-CM | POA: Diagnosis not present

## 2015-02-13 DIAGNOSIS — N138 Other obstructive and reflux uropathy: Secondary | ICD-10-CM | POA: Diagnosis not present

## 2015-02-27 ENCOUNTER — Encounter: Payer: Self-pay | Admitting: Vascular Surgery

## 2015-03-05 ENCOUNTER — Encounter: Payer: Self-pay | Admitting: Vascular Surgery

## 2015-03-05 ENCOUNTER — Ambulatory Visit (INDEPENDENT_AMBULATORY_CARE_PROVIDER_SITE_OTHER): Payer: Medicare Other | Admitting: Vascular Surgery

## 2015-03-05 VITALS — BP 146/75 | HR 49 | Temp 98.2°F | Ht 69.0 in | Wt 179.5 lb

## 2015-03-05 DIAGNOSIS — I739 Peripheral vascular disease, unspecified: Secondary | ICD-10-CM

## 2015-03-05 NOTE — Progress Notes (Signed)
POST OPERATIVE OFFICE NOTE    CC:  F/u for surgery  HPI:  This is a 74 y.o. male who is s/p bilateral common femoral endarterectomy and right to left femoral - femoral bypass grafting on 12/31/14.  He returns today for wound check.  he had some maceration of the right groin several weeks ago.   He has been using the antibacterial soap and a bandaid on the wound.  He states his claudication symptoms are better.    Allergies   Allergen  Reactions   .  Hctz [Hydrochlorothiazide]  Other (See Comments)       Hyponatremia  Low potassium        Current Outpatient Prescriptions   Medication  Sig  Dispense  Refill   .  ALPRAZolam (XANAX) 0.5 MG tablet  Take 0.5 mg by mouth 3 (three) times daily as needed for anxiety.        Marland Kitchen  amLODipine (NORVASC) 5 MG tablet  Take 1 tablet (5 mg total) by mouth daily.  90 tablet  0   .  aspirin 81 MG tablet  Take 81 mg by mouth daily.         .  carvedilol (COREG) 12.5 MG tablet  TAKE 1 TABLET TWICE A DAY  180 tablet  2   .  clopidogrel (PLAVIX) 75 MG tablet  Take 1 tablet (75 mg total) by mouth daily.  90 tablet  2   .  glimepiride (AMARYL) 4 MG tablet  Take 4 mg by mouth daily before breakfast.        .  iron polysaccharides (NIFEREX) 150 MG capsule  Take 150 mg by mouth daily.       .  nitroGLYCERIN (NITROSTAT) 0.4 MG SL tablet  Place 1 tablet (0.4 mg total) under the tongue every 5 (five) minutes as needed for chest pain.  25 tablet  3   .  pioglitazone (ACTOS) 45 MG tablet  Take 45 mg by mouth daily.        .  simvastatin (ZOCOR) 20 MG tablet  Take 1 tablet (20 mg total) by mouth at bedtime.  90 tablet  3   .  losartan (COZAAR) 100 MG tablet  Take 100 mg by mouth daily.        Marland Kitchen  oxyCODONE (ROXICODONE) 5 MG immediate release tablet  Take 1 tablet (5 mg total) by mouth every 6 (six) hours as needed. (Patient not taking: Reported on 02/12/2015)  30 tablet  0      No current facility-administered medications for this visit.      ROS:  See HPI  Physical  Exam:  Filed Vitals:   03/05/15 1436 03/05/15 1438  BP: 145/83 146/75  Pulse: 49   Temp: 98.2 F (36.8 C)   TempSrc: Oral   Height: 5\' 9"  (1.753 m)   Weight: 179 lb 8 oz (81.421 kg)   SpO2: 100%     Incision:  Left groin incision healed .  Right groin wound with ~ 1cm separation with yellow fibrinous tissue present debrided today Extremities:  + palpable femoral-femoral bypass graft, thickened toenails first toe bilaterally  Assessment/Plan: Patient has palpable femorofemoral pulse. His walking distance has improved significantly. He still has an open wound at the inferior aspect of his right groin incision. This was superficially debrided today. There is no exposed graft in the wound does not penetrate very deep hopefully this will heal in a few weeks. He'll do normal saline wet to dry  dressings andcome back to see me in a few weeks.  Ruta Hinds, MD [facility] [place] Office: [phone] Pager: [phone]

## 2015-03-06 ENCOUNTER — Ambulatory Visit (INDEPENDENT_AMBULATORY_CARE_PROVIDER_SITE_OTHER): Payer: Medicare Other | Admitting: Cardiology

## 2015-03-06 ENCOUNTER — Encounter: Payer: Self-pay | Admitting: Cardiology

## 2015-03-06 VITALS — BP 116/74 | HR 62 | Ht 69.0 in | Wt 181.0 lb

## 2015-03-06 DIAGNOSIS — I739 Peripheral vascular disease, unspecified: Secondary | ICD-10-CM

## 2015-03-06 DIAGNOSIS — Z951 Presence of aortocoronary bypass graft: Secondary | ICD-10-CM

## 2015-03-06 DIAGNOSIS — I25810 Atherosclerosis of coronary artery bypass graft(s) without angina pectoris: Secondary | ICD-10-CM | POA: Diagnosis not present

## 2015-03-06 NOTE — Addendum Note (Signed)
Addended by: Thresa Ross C on: 03/06/2015 01:55 PM   Modules accepted: Orders

## 2015-03-06 NOTE — Progress Notes (Signed)
[name] Date of Birth: 01/13/1941 Medical Record [id number]  History of Present Illness: [name] is seen for follow up CAD.  He has a history of coronary disease and is status post redo coronary bypass surgery in 2005 after emergent stenting of the left main. This included a free RIMA graft to the OM, SVG to diagonal, and SVG to PDA. LIMA to LAD was intact from original surgery. He has had an old anterior myocardial infarction.  In April 2016 he was admitted with SBO and underwent surgery with exploratory lap and lysis of adhesions. His post op course was complicated by marked volume overload due to IVF with over 20 lb weight gain. He was diuresed. Echo showed EF 45%.  In August he presented with progressive claudication in the left hip and leg. Dopplers showed severe PAD. Angiography was done by Dr. Oneida Alar and showed severe left iliac and bilateral common femoral disease. He had an abnormal Myoview study and cardiac cath was done as noted below. He underwent surgery by Dr. Oneida Alar with bilateral femoral endarterectomy and fem- fem BPG. On follow up today he is doing very well.   No chest pain or dyspnea.  Claudication has improved. Surgical incisions are doing well. He does note some edema R>L.  Bowels are working [employer]. No Nausea or abdominal pain.    Current Outpatient Prescriptions on File Prior to Visit  Medication Sig Dispense Refill  . ALPRAZolam (XANAX) 0.5 MG tablet Take 0.5 mg by mouth 3 (three) times daily as needed for anxiety.     Marland Kitchen amLODipine (NORVASC) 5 MG tablet Take 1 tablet (5 mg total) by mouth daily. 90 tablet 0  . aspirin 81 MG tablet Take 81 mg by mouth daily.      . carvedilol (COREG) 12.5 MG tablet TAKE 1 TABLET TWICE A DAY 180 tablet 2  . clopidogrel (PLAVIX) 75 MG tablet Take 1 tablet (75 mg total) by mouth daily. 90 tablet 2  . glimepiride (AMARYL) 4 MG tablet Take 4 mg by mouth daily before breakfast.     . iron polysaccharides (NIFEREX) 150 MG capsule Take 150  mg by mouth daily.    Marland Kitchen losartan (COZAAR) 100 MG tablet Take 100 mg by mouth daily.     . nitroGLYCERIN (NITROSTAT) 0.4 MG SL tablet Place 1 tablet (0.4 mg total) under the tongue every 5 (five) minutes as needed for chest pain. 25 tablet 3  . pioglitazone (ACTOS) 45 MG tablet Take 45 mg by mouth daily.     . simvastatin (ZOCOR) 20 MG tablet Take 1 tablet (20 mg total) by mouth at bedtime. 90 tablet 3   No current facility-administered medications on file prior to visit.    Allergies  Allergen Reactions  . Hctz [Hydrochlorothiazide] Other (See Comments)    Hyponatremia Low potassium     Past Medical History  Diagnosis Date  . CAD (coronary artery disease)   . History of acute inferior wall MI   . Diabetes mellitus   . HTN (hypertension)   . Dyslipidemia   . 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 BPH (benign prostatic hyperplasia)   . 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 Hyponatremia   . Acute on chronic diastolic CHF (congestive heart failure), NYHA class 1 ([place]) 08/09/2014  . PAD (peripheral artery disease) ([place])   . Myocardial infarction [facility])     x2  1986, 2005    Past Surgical History  Procedure Laterality Date  .  Coronary artery bypass graft  redo 2005    free Rima to OM, svg-diag,svg-pda  . Tonsillectomy    . Rotator cuff repair    . Melanoma excision    . Eye surgery      BILATERAL   . Kyphoplasty  02/29/2012    Procedure: KYPHOPLASTY;  Surgeon: Sinclair Ship, MD;  Location: [place];  Service: Orthopedics;  Laterality: Bilateral;  T 10 kyphoplasty  . Laparotomy N/A 08/02/2014    Procedure: EXPLORATORY LAPAROTOMY LYSIS OF ADHESIONS;  Surgeon: Donnie Mesa, MD;  Location: [place];  Service: General;  Laterality: N/A;  . Peripheral vascular catheterization N/A 12/05/2014    Procedure: Abdominal Aortogram;  Surgeon: Elam Dutch, MD;  Location: [place] [facility];  Service: Cardiovascular;  Laterality: N/A;  . Cardiac catheterization  12/23/2014    Procedure: Left  Heart Cath and Cors/Grafts Angiography;  Surgeon: Peter M Martinique, MD;  Location: [place] [facility];  Service: Cardiovascular;;  . Coronary angioplasty      cardiac stent   . Endarterectomy femoral Bilateral 12/31/2014    Procedure: BILATERAL FEMORAL ENDARTERECTOMY;  Surgeon: Elam Dutch, MD;  Location: [place];  Service: Vascular;  Laterality: Bilateral;  . Femoral-femoral bypass graft Bilateral 12/31/2014    Procedure: FEMORAL-FEMORAL ARTERY BYPASS GRAFT;  Surgeon: Elam Dutch, MD;  Location: [facility];  Service: Vascular;  Laterality: Bilateral;    History  Smoking status  . Former Smoker  . Quit date: 02/22/1986  Smokeless tobacco  . Never Used    History  Alcohol Use  . 0.0 oz/week  . 0 Standard drinks or equivalent per week    Comment: occ beer    Family History  Problem Relation Age of Onset  . Dementia Mother     Review of Systems: As noted in history of present illness.  All other systems were reviewed and are negative.  Physical Exam: BP 116/74 mmHg  Pulse 62  Ht 5\' 9"  (1.753 m)  Wt 82.101 kg (181 lb)  BMI 26.72 kg/m2 He is a pleasant white male in no acute distress.The patient is alert and oriented x 3.    The skin is warm and dry.  Color is normal.  The HEENT exam is unremarkable. Sclera are clear. PERRLA. The mucous membranes are moist.  The carotids are 2+ without bruits.  There is no thyromegaly.  There is no JVD.  The lungs are clear.   The heart exam reveals a regular rate with a normal S1 and S2.  There is a grade [id number] systolic ejection murmur the left sternal border.  The PMI is not displaced.   Abdominal exam is nontender. Bowel sounds are positive. There is no hepatosplenomegaly or tenderness.  There are no masses.  Exam of the legs reveal 1+ edema.  Feet are warm. Cranial nerves II - XII are intact.  Motor and sensory functions are intact.  The gait is normal.  LABORATORY DATA: Lab Results  Component Value Date   WBC 7.6 01/01/2015   HGB 8.6*  01/01/2015   HCT 24.4* 01/01/2015   PLT 126* 01/01/2015   GLUCOSE 135* 01/01/2015   TRIG 44 08/07/2014   ALT 15* 12/25/2014   AST 24 12/25/2014   NA 131* 01/01/2015   K 3.9 01/01/2015   CL 101 01/01/2015   CREATININE 0.99 01/01/2015   BUN 11 01/01/2015   CO2 24 01/01/2015   INR 1.41 12/25/2014   HGBA1C 6.7* 12/25/2014   Left Heart Cath and Cors/Grafts  Angiography    Conclusion     1st Diag lesion, 80% stenosed.  Prox LAD lesion, 100% stenosed.  Ost Cx to Prox Cx lesion, 100% stenosed.  Mid RCA lesion, 100% stenosed.  SVG was injected is normal in caliber, and is anatomically normal.  SVG was injected is normal in caliber, and is anatomically normal.  LIMA was injected is normal in caliber, and is anatomically normal.  1st Mrg lesion, 90% stenosed.  Ost 2nd Diag to 2nd Diag lesion, 100% stenosed.  1. Severe 3 vessel obstructive CAD 2. Patent LIMA to the LAD 3. Patent free RIMA to the first OM. There is a severe stenosis in the proximal OM which limits flow into the distal LCx. 4. Patent SVG to PDA- this fills the entire RCA 5. Mildly elevated EDP.  Prior notes indicate a SVG to a diagonal. This was not seen. There was no aortic marker for this graft. The first diagonal has good flow from the native vessel. There are left to left collaterals from the OM 1 to a second diagonal. Given limitations of radial access I did not spend a lot of time searching for this graft. It appears that his circulation is well accounted for.   Plan: continue medical therapy. He is cleared to proceed with major vascular surgery from a cardiac standpoint and may stop Plavix one week prior.      Indications    Abnormal nuclear stress test [R93.1 (ICD-10-CM)]    Technique and Indications    Indication: 74 yo WM s/p redo CABG in 2005 presents with progressive claudication with plans for major vascular surgery. Myoview study was abnormal.  Procedural Details: The left wrist was  prepped, draped, and anesthetized with 1% lidocaine. Using the modified Seldinger technique, a 6 French slender sheath was introduced into the leftradial artery. The left radial artery was heavily calcified and the sheath could only be partially advanced into the artery. The procedure was completed using 4 Fr catheters. 3 mg of verapamil was administered through the sheath, weight-based unfractionated heparin was administered intravenously. Standard Judkins catheters were used for selective coronary and graft angiography and left ventricular pressures. Catheter exchanges were performed over an exchange length guidewire. There were no immediate procedural complications. A TR band was used for radial hemostasis at the completion of the procedure. The patient was transferred to the post catheterization recovery area for further monitoring.  Estimated blood loss <50 mL. There were no immediate complications during the procedure.    Coronary Findings    Dominance: Right   Left Main   . Ost LM to LM lesion, 0% stenosed. Previously placed Ost LM to LM stent (unknown type) is patent.     Left Anterior Descending   . Prox LAD lesion, 100% stenosed.   . First Diagonal Branch   . 1st Diag lesion, 80% stenosed.   . Second Diagonal Branch   2nd Diag filled by collaterals from [place] 2nd Diag to 2nd Diag lesion, 100% stenosed.     Ramus Intermedius  The vessel is small .   Marland Kitchen Ost Ramus lesion, 0% stenosed.     Left Circumflex   . Ost Cx to Prox Cx lesion, 100% stenosed.   . First Obtuse Marginal Branch   The vessel is large in size.   . 1st Mrg lesion, 90% stenosed.   . Second Obtuse Marginal Branch   The vessel is small in size.   . Third Obtuse Marginal Branch  The vessel is small in size.     Right Coronary Artery   . Mid RCA lesion, 100% stenosed.     Graft Angiography    Free Graft to RPDA  SVG was injected is normal in caliber, and is anatomically normal.     Free  Graft to 1st Mrg  SVG was injected is normal in caliber, and is anatomically normal.     Free LIMA Graft to Mid LAD  LIMA was injected is normal in caliber, and is anatomically normal.          Coronary Diagrams    Diagnostic Diagram               Assessment / Plan: 1. Coronary disease status post redo CABG in 2005. Remote anterior myocardial infarction. Prior left main stent. Cardiac cath showed patent grafts. Continue medical therapy.   2. Acute diastolic CHF secondary to volume resuscitation with SBO in April. Resolved. Now some edema post vascular surgery.  3. Hypertension,  well controlled  4. Hyperlipidemia on Zocor.  5. Severe PAD with limiting claudication. S/p bilateral femoral endarterectomy and fem-fem bypass. Good recovery.

## 2015-03-13 ENCOUNTER — Encounter: Payer: Self-pay | Admitting: Vascular Surgery

## 2015-03-19 ENCOUNTER — Encounter: Payer: Self-pay | Admitting: Podiatry

## 2015-03-19 ENCOUNTER — Ambulatory Visit: Payer: Medicare Other | Admitting: Vascular Surgery

## 2015-03-19 ENCOUNTER — Ambulatory Visit (INDEPENDENT_AMBULATORY_CARE_PROVIDER_SITE_OTHER): Payer: Medicare Other | Admitting: Vascular Surgery

## 2015-03-19 ENCOUNTER — Ambulatory Visit (INDEPENDENT_AMBULATORY_CARE_PROVIDER_SITE_OTHER): Payer: Medicare Other | Admitting: Podiatry

## 2015-03-19 ENCOUNTER — Encounter: Payer: Self-pay | Admitting: Vascular Surgery

## 2015-03-19 VITALS — BP 125/53 | HR 72 | Temp 98.5°F | Resp 16 | Ht 69.0 in | Wt 188.0 lb

## 2015-03-19 VITALS — BP 124/53 | HR 75 | Resp 16

## 2015-03-19 DIAGNOSIS — IMO0001 Reserved for inherently not codable concepts without codable children: Secondary | ICD-10-CM | POA: Insufficient documentation

## 2015-03-19 DIAGNOSIS — E1142 Type 2 diabetes mellitus with diabetic polyneuropathy: Secondary | ICD-10-CM | POA: Insufficient documentation

## 2015-03-19 DIAGNOSIS — M79676 Pain in unspecified toe(s): Secondary | ICD-10-CM | POA: Diagnosis not present

## 2015-03-19 DIAGNOSIS — I739 Peripheral vascular disease, unspecified: Secondary | ICD-10-CM | POA: Insufficient documentation

## 2015-03-19 DIAGNOSIS — I10 Essential (primary) hypertension: Secondary | ICD-10-CM | POA: Insufficient documentation

## 2015-03-19 DIAGNOSIS — C439 Malignant melanoma of skin, unspecified: Secondary | ICD-10-CM | POA: Insufficient documentation

## 2015-03-19 DIAGNOSIS — D649 Anemia, unspecified: Secondary | ICD-10-CM | POA: Insufficient documentation

## 2015-03-19 DIAGNOSIS — B351 Tinea unguium: Secondary | ICD-10-CM

## 2015-03-19 DIAGNOSIS — M1990 Unspecified osteoarthritis, unspecified site: Secondary | ICD-10-CM | POA: Insufficient documentation

## 2015-03-19 DIAGNOSIS — E871 Hypo-osmolality and hyponatremia: Secondary | ICD-10-CM | POA: Insufficient documentation

## 2015-03-19 DIAGNOSIS — I2510 Atherosclerotic heart disease of native coronary artery without angina pectoris: Secondary | ICD-10-CM | POA: Insufficient documentation

## 2015-03-19 NOTE — Progress Notes (Signed)
POST OPERATIVE OFFICE NOTE    CC:  F/u for surgery  HPI:  This is a 74 y.o. male who is s/p bilateral common femoral endarterectomy and right to left femoral - femoral bypass grafting on 12/31/14.  He returns today for wound check.  he had some maceration of the right groin several weeks ago.   He has been using the antibacterial soap and a bandaid on the wound.  He states his claudication symptoms are better.    Allergies    Allergen   Reactions    .   Hctz [Hydrochlorothiazide]   Other (See Comments)          Hyponatremia    Low potassium         Current Outpatient Prescriptions    Medication   Sig   Dispense   Refill    .   ALPRAZolam (XANAX) 0.5 MG tablet   Take 0.5 mg by mouth 3 (three) times daily as needed for anxiety.           Marland Kitchen   amLODipine (NORVASC) 5 MG tablet   Take 1 tablet (5 mg total) by mouth daily.   90 tablet   0    .   aspirin 81 MG tablet   Take 81 mg by mouth daily.            .   carvedilol (COREG) 12.5 MG tablet   TAKE 1 TABLET TWICE A DAY   180 tablet   2    .   clopidogrel (PLAVIX) 75 MG tablet   Take 1 tablet (75 mg total) by mouth daily.   90 tablet   2    .   glimepiride (AMARYL) 4 MG tablet   Take 4 mg by mouth daily before breakfast.           .   iron polysaccharides (NIFEREX) 150 MG capsule   Take 150 mg by mouth daily.          .   nitroGLYCERIN (NITROSTAT) 0.4 MG SL tablet   Place 1 tablet (0.4 mg total) under the tongue every 5 (five) minutes as needed for chest pain.   25 tablet   3    .   pioglitazone (ACTOS) 45 MG tablet   Take 45 mg by mouth daily.           .   simvastatin (ZOCOR) 20 MG tablet   Take 1 tablet (20 mg total) by mouth at bedtime.   90 tablet   3    .   losartan (COZAAR) 100 MG tablet   Take 100 mg by mouth daily.           Marland Kitchen   oxyCODONE (ROXICODONE) 5 MG immediate release tablet   Take 1 tablet (5 mg total) by mouth every 6 (six) hours as needed. (Patient not taking: Reported on 02/12/2015)   30 tablet   0       No current  facility-administered medications for this visit.       ROS:  See HPI  Physical Exam:    Filed Vitals:   03/19/15 1534  BP: 125/53  Pulse: 72  Temp: 98.5 F (36.9 C)  TempSrc: Oral  Resp: 16  Height: 5\' 9"  (1.753 m)  Weight: 188 lb (85.276 kg)  SpO2: 97%    Incision:  Left groin incision healed .  Right groin wound with  Small dry eschar inferior pole of right groin incision essentially healed  no surrounding erythema Extremities:  + palpable femoral-femoral bypass graft  Assessment/Plan: Patient has palpable femorofemoral pulse. His walking distance has improved significantly. All wounds healed at this point. Patient will follow-up in 3 months time for bilateral ABIs  Ruta Hinds, MD [facility] [place] Office: [phone] Pager: [phone]

## 2015-03-19 NOTE — Progress Notes (Signed)
   Subjective:    Patient ID: [name], male    DOB: 09/25/1940, 74 y.o.   MRN: [id number]  HPI: He presents today with chief complaint of painfully thick mycotic nails hallux bilateral. He states nothing to have ingrown nails that need to be cut out. He relates a history of cardiac disease including open heart surgery as well as a femorofemoral bypass and is currently taking Plavix.  Review of Systems  All other systems reviewed and are negative.      Objective:   Physical Exam: 74 year old male very pleasant in no apparent distress. Vital signs stable he is alert and oriented 3. Pulses are strongly palpable. Neurologic sensorium is intact. Deep tendon reflexes are intact. Muscle strength is normal bilateral. Orthopedic evaluation due straights all joints distal to the ankle for range of motion without crepitus some early osteoarthritic changes in the bilateral toes. Cutaneous evaluation demonstrates thick yellow dystrophic onychomycotic nails are sharply incurvated nail margins particularly the hallux bilaterally. Otherwise the remainder of the nails are slightly thickened but less tender.  Assessment: Pain in limb secondary to onychomycosis bilateral.  Plan: Debridement of toenails 1 through 5 bilateral covered service secondary to pain.      Assessment & Plan:

## 2015-03-20 NOTE — Addendum Note (Signed)
Addended by: Dorthula Rue L on: 03/20/2015 04:19 PM   Modules accepted: Orders

## 2015-03-26 ENCOUNTER — Telehealth: Payer: Self-pay | Admitting: Podiatry

## 2015-03-26 NOTE — Telephone Encounter (Signed)
Left message for pt. To call to r/s appt

## 2015-05-21 ENCOUNTER — Ambulatory Visit (INDEPENDENT_AMBULATORY_CARE_PROVIDER_SITE_OTHER): Payer: Medicare Other | Admitting: Podiatry

## 2015-05-21 ENCOUNTER — Encounter: Payer: Self-pay | Admitting: Podiatry

## 2015-05-21 DIAGNOSIS — B351 Tinea unguium: Secondary | ICD-10-CM

## 2015-05-21 DIAGNOSIS — M79676 Pain in unspecified toe(s): Secondary | ICD-10-CM | POA: Diagnosis not present

## 2015-05-21 NOTE — Progress Notes (Signed)
He presents today with chief complaint of painful elongated toenails were sharply debrided margins. He states that his diabetes is under good control.  Objective: vital signs are stable he is alert and oriented 3. Pulses are palpable. Neurologic sensorium is intact. Thick yellow dystrophic clinic with mycotic nails with sharply incurvated nail margins.  Assessment: A limb secondary to onychomycosis.  Plan: Debrided nails 1 through 5 bilateral. Follow up with him in 3 months

## 2015-06-18 ENCOUNTER — Encounter (HOSPITAL_COMMUNITY): Payer: Medicare Other

## 2015-06-18 ENCOUNTER — Ambulatory Visit: Payer: Medicare Other | Admitting: Vascular Surgery

## 2015-07-08 ENCOUNTER — Encounter: Payer: Self-pay | Admitting: Vascular Surgery

## 2015-07-16 ENCOUNTER — Encounter: Payer: Self-pay | Admitting: Vascular Surgery

## 2015-07-16 ENCOUNTER — Ambulatory Visit (INDEPENDENT_AMBULATORY_CARE_PROVIDER_SITE_OTHER): Payer: Medicare Other | Admitting: Vascular Surgery

## 2015-07-16 ENCOUNTER — Ambulatory Visit (HOSPITAL_COMMUNITY)
Admission: RE | Admit: 2015-07-16 | Discharge: 2015-07-16 | Disposition: A | Discharge status: Home / Self Care | Payer: Medicare Other | Source: Ambulatory Visit | Attending: Vascular Surgery | Admitting: Vascular Surgery

## 2015-07-16 VITALS — BP 128/70 | HR 66 | Temp 97.6°F | Resp 12 | Ht 69.0 in | Wt 174.0 lb

## 2015-07-16 DIAGNOSIS — I5032 Chronic diastolic (congestive) heart failure: Secondary | ICD-10-CM | POA: Diagnosis not present

## 2015-07-16 DIAGNOSIS — I110 Hypertensive heart disease with heart failure: Secondary | ICD-10-CM | POA: Diagnosis not present

## 2015-07-16 DIAGNOSIS — I2510 Atherosclerotic heart disease of native coronary artery without angina pectoris: Secondary | ICD-10-CM | POA: Diagnosis not present

## 2015-07-16 DIAGNOSIS — R938 Abnormal findings on diagnostic imaging of other specified body structures: Secondary | ICD-10-CM | POA: Diagnosis not present

## 2015-07-16 DIAGNOSIS — I739 Peripheral vascular disease, unspecified: Secondary | ICD-10-CM | POA: Diagnosis not present

## 2015-07-16 DIAGNOSIS — E785 Hyperlipidemia, unspecified: Secondary | ICD-10-CM | POA: Insufficient documentation

## 2015-07-16 DIAGNOSIS — E119 Type 2 diabetes mellitus without complications: Secondary | ICD-10-CM | POA: Insufficient documentation

## 2015-07-16 DIAGNOSIS — R0989 Other specified symptoms and signs involving the circulatory and respiratory systems: Secondary | ICD-10-CM | POA: Diagnosis present

## 2015-07-16 NOTE — Progress Notes (Signed)
Referring Physician: Lona Kettle, MD  Patient name: [name] MRN: [id number] DOB: 26-Oct-1940 Sex: male  REASON FOR visit: f/u s/p Fem-Fem by pass and bilateral endarterectomy.  HPI: [name] is a 75 y.o. male,  With history of left LE claudication.  He states he is able to do yard work for up to 2 hours without LE pain.  He continue to take aspirin and  plavix since he has hadd heart surgery.  No new complaints since his surgery.  He underwent femoral-femoral bypass with bilateral femoral endarterectomy October 2016.  Past Medical History  Diagnosis Date  . CAD (coronary artery disease)   . History of acute inferior wall MI   . Diabetes mellitus   . HTN (hypertension)   . Dyslipidemia   . 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 BPH (benign prostatic hyperplasia)   . 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 Hyponatremia   . Acute on chronic diastolic CHF (congestive heart failure), NYHA class 1 ([place]) 08/09/2014  . PAD (peripheral artery disease) ([place])   . Myocardial infarction [facility])     x2  1986, 2005   Past Surgical History  Procedure Laterality Date  . Coronary artery bypass graft  redo 2005    free Rima to OM, svg-diag,svg-pda  . Tonsillectomy    . Rotator cuff repair    . Melanoma excision    . Eye surgery      BILATERAL   . Kyphoplasty  02/29/2012    Procedure: KYPHOPLASTY;  Surgeon: Sinclair Ship, MD;  Location: [place];  Service: Orthopedics;  Laterality: Bilateral;  T 10 kyphoplasty  . Laparotomy N/A 08/02/2014    Procedure: EXPLORATORY LAPAROTOMY LYSIS OF ADHESIONS;  Surgeon: Donnie Mesa, MD;  Location: [place];  Service: General;  Laterality: N/A;  . Peripheral vascular catheterization N/A 12/05/2014    Procedure: Abdominal Aortogram;  Surgeon: Elam Dutch, MD;  Location: [place] [facility];  Service: Cardiovascular;  Laterality: N/A;  . Cardiac catheterization  12/23/2014    Procedure: Left Heart Cath and Cors/Grafts Angiography;  Surgeon: Peter M  Martinique, MD;  Location: [place] [facility];  Service: Cardiovascular;;  . Coronary angioplasty      cardiac stent   . Endarterectomy femoral Bilateral 12/31/2014    Procedure: BILATERAL FEMORAL ENDARTERECTOMY;  Surgeon: Elam Dutch, MD;  Location: [place];  Service: Vascular;  Laterality: Bilateral;  . Femoral-femoral bypass graft Bilateral 12/31/2014    Procedure: FEMORAL-FEMORAL ARTERY BYPASS GRAFT;  Surgeon: Elam Dutch, MD;  Location: [facility] OR;  Service: Vascular;  Laterality: Bilateral;    Family History  Problem Relation Age of Onset  . Dementia Mother     SOCIAL HISTORY: Social History   Social History  . Marital Status: Married    Spouse Name: N/A  . Number of Children: 2  . Years of Education: N/A   Occupational History  . insurance    Social History Main Topics  . Smoking status: Former Smoker    Quit date: 02/22/1986  . Smokeless tobacco: Never Used  . Alcohol Use: 0.0 oz/week    0 Standard drinks or equivalent per week     Comment: occ beer  . Drug Use: No  . Sexual Activity: Not on file   Other Topics Concern  . Not on file   Social History Narrative    Allergies  Allergen Reactions  . Hctz [Hydrochlorothiazide] Other (See Comments)    Hyponatremia Low  potassium     Current Outpatient Prescriptions  Medication Sig Dispense Refill  . ALPRAZolam (XANAX) 0.5 MG tablet Take 0.5 mg by mouth 3 (three) times daily as needed for anxiety.     Marland Kitchen amLODipine (NORVASC) 5 MG tablet Take 1 tablet (5 mg total) by mouth daily. 90 tablet 0  . aspirin 81 MG tablet Take 81 mg by mouth daily.      . carvedilol (COREG) 12.5 MG tablet TAKE 1 TABLET TWICE A DAY 180 tablet 2  . clopidogrel (PLAVIX) 75 MG tablet Take 1 tablet (75 mg total) by mouth daily. 90 tablet 2  . glimepiride (AMARYL) 4 MG tablet Take 4 mg by mouth daily at 2 PM.     . iron polysaccharides (NIFEREX) 150 MG capsule Take 150 mg by mouth daily.    Marland Kitchen losartan (COZAAR) 100 MG tablet Take 100 mg by  mouth daily.     . pioglitazone (ACTOS) 45 MG tablet Take 45 mg by mouth daily.     . simvastatin (ZOCOR) 20 MG tablet Take 1 tablet (20 mg total) by mouth at bedtime. 90 tablet 3  . nitroGLYCERIN (NITROSTAT) 0.4 MG SL tablet Place 1 tablet (0.4 mg total) under the tongue every 5 (five) minutes as needed for chest pain. (Patient not taking: Reported on 07/16/2015) 25 tablet 3   No current facility-administered medications for this visit.    ROS:   General:  No weight loss, Fever, chills  HEENT: No recent headaches, no nasal bleeding, no visual changes, no sore throat  Neurologic: No dizziness, blackouts, seizures. No recent symptoms of stroke or mini- stroke. No recent episodes of slurred speech, or temporary blindness.  Cardiac: No recent episodes of chest pain/pressure, no shortness of breath at rest.  No shortness of breath with exertion.  Denies history of atrial fibrillation or irregular heartbeat  Vascular: No history of rest pain in feet.  No history of claudication.  No history of non-healing ulcer, No history of DVT   Pulmonary: No home oxygen, no productive cough, no hemoptysis,  No asthma or wheezing  Musculoskeletal:  [ ]  Arthritis, [ ]  Low back pain,  [ ]  Joint pain  Hematologic:No history of hypercoagulable state.  No history of easy bleeding.  No history of anemia  Gastrointestinal: No hematochezia or melena,  No gastroesophageal reflux, no trouble swallowing  Urinary: [ ]  chronic Kidney disease, [ ]  on HD - [ ]  MWF or [ ]  TTHS, [ ]  Burning with urination, [ ]  Frequent urination, [ ]  Difficulty urinating;   Skin: No rashes  Psychological: No history of anxiety,  No history of depression   Physical Examination  Filed Vitals:   07/16/15 1348  BP: 128/70  Pulse: 66  Temp: 97.6 F (36.4 C)  TempSrc: Oral  Resp: 12  Height: 5\' 9"  (1.753 m)  Weight: 174 lb (78.926 kg)  SpO2: 100%    Body mass index is 25.68 kg/(m^2).  General:  Alert and oriented, no acute  distress HEENT: Normal Neck: No bruit or JVD Pulmonary: Clear to auscultation bilaterally Cardiac: Regular Rate and Rhythm without murmur Skin: No rash Extremity Pulses:  2+ radial, brachial, femoral bilaterally 2+ pulses, easily palpable graft pulse Musculoskeletal: No deformity or edema  Neurologic: Upper and lower extremity motor 5/5 and symmetric  DATA:  ABI Right Biphasic 1.0 Left monophasic non compressible due to calcification  ASSESSMENT:   PAD with history of claudication   PLAN:   We encourage walking for exercise.  He will f/u in 6 months for repeat ABI's.  Dr. Oneida Alar discussed if his symptoms of claudication return he should let us know other wise activity as tolerates.   Theda Sers EMMA [facility] PA-C [facility] [place] Office: [phone]  The patient was seen in conjunction with Dr. Oneida Alar  History and exam findings as above. Patient is an easily palpable femoral femoral graft pulse. He has 2+ femoral pulses bilaterally. He has no claudication symptoms currently. He will follow-up in 6 months time for repeat ABIs.  Ruta Hinds, MD [facility] [place] Office: [phone] Pager: [phone]

## 2015-07-23 ENCOUNTER — Ambulatory Visit (INDEPENDENT_AMBULATORY_CARE_PROVIDER_SITE_OTHER): Payer: Medicare Other | Admitting: Podiatry

## 2015-07-23 ENCOUNTER — Encounter: Payer: Self-pay | Admitting: Podiatry

## 2015-07-23 DIAGNOSIS — M79676 Pain in unspecified toe(s): Secondary | ICD-10-CM

## 2015-07-23 DIAGNOSIS — B351 Tinea unguium: Secondary | ICD-10-CM

## 2015-07-23 NOTE — Progress Notes (Signed)
He presents today with a chief complaint of painful elongated toenails 1 through 5 bilateral.  Objective: Pulses are palpable bilateral. Toenails are thick yellow dystrophic onychomycotic. No open lesions or wounds.  Assessment: Pain in limb secondary to onychomycosis.  Plan: Debridement of toenails 1 through 5 bilateral covered service secondary to pain follow up with him in 8 weeks.

## 2015-08-21 NOTE — Addendum Note (Signed)
Addended by: Mena Goes on: 08/21/2015 01:23 PM   Modules accepted: Orders

## 2015-09-03 ENCOUNTER — Encounter: Payer: Self-pay | Admitting: Cardiology

## 2015-09-03 ENCOUNTER — Ambulatory Visit (INDEPENDENT_AMBULATORY_CARE_PROVIDER_SITE_OTHER): Payer: Medicare Other | Admitting: Cardiology

## 2015-09-03 VITALS — BP 126/68 | HR 66 | Ht 69.0 in | Wt 174.0 lb

## 2015-09-03 DIAGNOSIS — Z951 Presence of aortocoronary bypass graft: Secondary | ICD-10-CM | POA: Diagnosis not present

## 2015-09-03 DIAGNOSIS — E1151 Type 2 diabetes mellitus with diabetic peripheral angiopathy without gangrene: Secondary | ICD-10-CM | POA: Diagnosis not present

## 2015-09-03 DIAGNOSIS — I25810 Atherosclerosis of coronary artery bypass graft(s) without angina pectoris: Secondary | ICD-10-CM

## 2015-09-03 NOTE — Progress Notes (Signed)
[name] Date of Birth: 1940/10/31 Medical Record [id number]  History of Present Illness: [name] is seen for follow up CAD.  He has a history of coronary disease and is status post redo coronary bypass surgery in 2005 after emergent stenting of the left main. This included a free RIMA graft to the OM, SVG to diagonal, and SVG to PDA. LIMA to LAD was intact from original surgery. He has had an old anterior myocardial infarction.  In April 2016 he was admitted with SBO and underwent surgery with exploratory lap and lysis of adhesions. His post op course was complicated by marked volume overload due to IVF with over 20 lb weight gain. He was diuresed. Echo showed EF 45%.  In August he presented with progressive claudication in the left hip and leg. Dopplers showed severe PAD. Angiography was done by Dr. Oneida Alar and showed severe left iliac and bilateral common femoral disease. He had an abnormal Myoview study and cardiac cath was done and showed patent grafts.  He underwent surgery by Dr. Oneida Alar with bilateral femoral endarterectomy and fem- fem BPG. On follow up today he is doing very well.   No chest pain or dyspnea.  No significant claudication.  Edema improved. He is not walking much due to lack of motivation. Reports last A1c up to 8.1%. He reports he stays cold a lot. Bruises easily on ASA and Plavix.    Current Outpatient Prescriptions on File Prior to Visit  Medication Sig Dispense Refill  . ALPRAZolam (XANAX) 0.5 MG tablet Take 0.5 mg by mouth 3 (three) times daily as needed for anxiety.     Marland Kitchen amLODipine (NORVASC) 5 MG tablet Take 1 tablet (5 mg total) by mouth daily. 90 tablet 0  . aspirin 81 MG tablet Take 81 mg by mouth daily.      . carvedilol (COREG) 12.5 MG tablet TAKE 1 TABLET TWICE A DAY 180 tablet 2  . clopidogrel (PLAVIX) 75 MG tablet Take 1 tablet (75 mg total) by mouth daily. 90 tablet 2  . glimepiride (AMARYL) 4 MG tablet Take 4 mg by mouth daily at 2 PM.     . iron  polysaccharides (NIFEREX) 150 MG capsule Take 150 mg by mouth daily.    Marland Kitchen losartan (COZAAR) 100 MG tablet Take 100 mg by mouth daily.     . nitroGLYCERIN (NITROSTAT) 0.4 MG SL tablet Place 1 tablet (0.4 mg total) under the tongue every 5 (five) minutes as needed for chest pain. 25 tablet 3  . pioglitazone (ACTOS) 45 MG tablet Take 45 mg by mouth daily.     . simvastatin (ZOCOR) 20 MG tablet Take 1 tablet (20 mg total) by mouth at bedtime. 90 tablet 3   No current facility-administered medications on file prior to visit.    Allergies  Allergen Reactions  . Hctz [Hydrochlorothiazide] Other (See Comments)    Hyponatremia Low potassium     Past Medical History  Diagnosis Date  . CAD (coronary artery disease)   . History of acute inferior wall MI   . Diabetes mellitus   . HTN (hypertension)   . Dyslipidemia   . 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 BPH (benign prostatic hyperplasia)   . 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 Hyponatremia   . Acute on chronic diastolic CHF (congestive heart failure), NYHA class 1 ([place]) 08/09/2014  . PAD (peripheral artery disease) ([place])   . Myocardial infarction ([place])     x2  1986, 2005    Past Surgical History  Procedure Laterality Date  . Coronary artery bypass graft  redo 2005    free Rima to OM, svg-diag,svg-pda  . Tonsillectomy    . Rotator cuff repair    . Melanoma excision    . Eye surgery      BILATERAL   . Kyphoplasty  02/29/2012    Procedure: KYPHOPLASTY;  Surgeon: Sinclair Ship, MD;  Location: [place];  Service: Orthopedics;  Laterality: Bilateral;  T 10 kyphoplasty  . Laparotomy N/A 08/02/2014    Procedure: EXPLORATORY LAPAROTOMY LYSIS OF ADHESIONS;  Surgeon: Donnie Mesa, MD;  Location: [place];  Service: General;  Laterality: N/A;  . Peripheral vascular catheterization N/A 12/05/2014    Procedure: Abdominal Aortogram;  Surgeon: Elam Dutch, MD;  Location: [place] [facility];  Service: Cardiovascular;  Laterality: N/A;  .  Cardiac catheterization  12/23/2014    Procedure: Left Heart Cath and Cors/Grafts Angiography;  Surgeon: Hollyn Stucky M Martinique, MD;  Location: [place] [facility];  Service: Cardiovascular;;  . Coronary angioplasty      cardiac stent   . Endarterectomy femoral Bilateral 12/31/2014    Procedure: BILATERAL FEMORAL ENDARTERECTOMY;  Surgeon: Elam Dutch, MD;  Location: [place];  Service: Vascular;  Laterality: Bilateral;  . Femoral-femoral bypass graft Bilateral 12/31/2014    Procedure: FEMORAL-FEMORAL ARTERY BYPASS GRAFT;  Surgeon: Elam Dutch, MD;  Location: [facility];  Service: Vascular;  Laterality: Bilateral;    History  Smoking status  . Former Smoker  . Quit date: 02/22/1986  Smokeless tobacco  . Never Used    History  Alcohol Use  . 0.0 oz/week  . 0 Standard drinks or equivalent per week    Comment: occ beer    Family History  Problem Relation Age of Onset  . Dementia Mother     Review of Systems: As noted in history of present illness.  All other systems were reviewed and are negative.  Physical Exam: BP 126/68 mmHg  Pulse 66  Ht 5\' 9"  (1.753 m)  Wt 174 lb (78.926 kg)  BMI 25.68 kg/m2 He is a pleasant white male in no acute distress.The patient is alert and oriented x 3.    The skin is warm and dry.  Color is normal.  The HEENT exam is unremarkable. Sclera are clear. PERRLA. The mucous membranes are moist.  The carotids are 2+ without bruits.  There is no thyromegaly.  There is no JVD.  The lungs are clear.   The heart exam reveals a regular rate with a normal S1 and S2.  There is a grade [id number] systolic ejection murmur the left sternal border.  The PMI is not displaced.   Abdominal exam is nontender. Bowel sounds are positive. There is no hepatosplenomegaly or tenderness.  There are no masses.  Exam of the legs reveal 1+ edema.  Feet are warm. Cranial nerves II - XII are intact.  Motor and sensory functions are intact.  The gait is normal.  LABORATORY DATA: Lab Results   Component Value Date   WBC 7.6 01/01/2015   HGB 8.6* 01/01/2015   HCT 24.4* 01/01/2015   PLT 126* 01/01/2015   GLUCOSE 135* 01/01/2015   TRIG 44 08/07/2014   ALT 15* 12/25/2014   AST 24 12/25/2014   NA 131* 01/01/2015   K 3.9 01/01/2015   CL 101 01/01/2015   CREATININE 0.99 01/01/2015   BUN 11 01/01/2015   CO2 24 01/01/2015   INR  1.41 12/25/2014   HGBA1C 6.7* 12/25/2014    Assessment / Plan: 1. Coronary disease status post redo CABG in 2005. Remote anterior myocardial infarction. Prior left main stent. Cardiac cath in September 2016 showed patent grafts. Continue medical therapy.   2. Chronic diastolic CHF secondary to volume resuscitation with SBO in April 2016. Resolved. Appears euvolemic today.  3. Hypertension,  well controlled  4. Hyperlipidemia on Zocor.  5. Severe PAD- S/p bilateral femoral endarterectomy and fem-fem bypass. Good recovery. Stressed importance of regular aerobic walking.   6. DM type 2

## 2015-09-03 NOTE — Patient Instructions (Addendum)
Continue your current therapy  Increase your walking  I will see you in 6 months.

## 2015-09-24 ENCOUNTER — Encounter: Payer: Self-pay | Admitting: Podiatry

## 2015-09-24 ENCOUNTER — Ambulatory Visit (INDEPENDENT_AMBULATORY_CARE_PROVIDER_SITE_OTHER): Payer: Medicare Other | Admitting: Podiatry

## 2015-09-24 DIAGNOSIS — M79676 Pain in unspecified toe(s): Secondary | ICD-10-CM | POA: Diagnosis not present

## 2015-09-24 DIAGNOSIS — B351 Tinea unguium: Secondary | ICD-10-CM

## 2015-09-24 NOTE — Progress Notes (Signed)
He presents today with chief complaint of painful elongated toenails 1 through 5 bilateral.  Objective: Vital signs are stable alert and oriented 3 pulses are palpable. Neurologic sensorium is intact. His toenails are thick yellow dystrophic with mycotic painful elongated.  Assessment: Pain secondary to onychomycosis.  Plan: Debridement of toenails 1 through 5 bilateral.

## 2015-10-21 ENCOUNTER — Other Ambulatory Visit: Payer: Self-pay

## 2015-10-21 MED ORDER — SIMVASTATIN 20 MG PO TABS: 20.0000 mg | ORAL_TABLET | Freq: Every day | ORAL | 3 refills | Status: DC

## 2015-10-21 NOTE — Telephone Encounter (Signed)
Per pt he med refill on SIMVASTATIN 20mg  90days please. He is out and [facility] [place].  per pt office rejected refill request.

## 2015-10-21 NOTE — Telephone Encounter (Signed)
Rx sent to pharmacy-unable to reach pt, left message to make aware (ok per DPR).

## 2015-11-26 ENCOUNTER — Ambulatory Visit (INDEPENDENT_AMBULATORY_CARE_PROVIDER_SITE_OTHER): Payer: Medicare Other | Admitting: Podiatry

## 2015-11-26 ENCOUNTER — Encounter: Payer: Self-pay | Admitting: Podiatry

## 2015-11-26 DIAGNOSIS — B351 Tinea unguium: Secondary | ICD-10-CM

## 2015-11-26 DIAGNOSIS — M79676 Pain in unspecified toe(s): Secondary | ICD-10-CM | POA: Diagnosis not present

## 2015-11-26 NOTE — Progress Notes (Signed)
He presents today with a chief complaint of painful elongated toenails.  Objective: Toenails are thick yellow dystrophic onychomycotic pulses remain palpable.  Assessment: Pain and limp secondary to onychomycosis.  Plan: Debridement of toenails 1 through 5 bilateral.

## 2016-01-03 ENCOUNTER — Other Ambulatory Visit: Payer: Self-pay | Admitting: Cardiology

## 2016-01-19 ENCOUNTER — Encounter: Payer: Self-pay | Admitting: Family

## 2016-01-19 ENCOUNTER — Encounter: Payer: Self-pay | Admitting: Podiatry

## 2016-01-19 ENCOUNTER — Ambulatory Visit (INDEPENDENT_AMBULATORY_CARE_PROVIDER_SITE_OTHER): Payer: Medicare Other | Admitting: Podiatry

## 2016-01-19 DIAGNOSIS — M79676 Pain in unspecified toe(s): Secondary | ICD-10-CM | POA: Diagnosis not present

## 2016-01-19 DIAGNOSIS — B351 Tinea unguium: Secondary | ICD-10-CM | POA: Diagnosis not present

## 2016-01-19 NOTE — Progress Notes (Signed)
He presents today chief complaint of painful elongated toenails bilateral.  Objective: Pulses are palpable no open lesions or wounds. Toenails are thick yellow dystrophic with mycotic and painful palpation.  Assessment: Pain limb secondary to onychomycosis.  Plan: Debridement of toenails 1 through 5 bilateral. Follow up with him in 6 weeks.

## 2016-01-21 ENCOUNTER — Encounter: Payer: Self-pay | Admitting: Family

## 2016-01-21 ENCOUNTER — Ambulatory Visit (INDEPENDENT_AMBULATORY_CARE_PROVIDER_SITE_OTHER): Payer: Medicare Other | Admitting: Family

## 2016-01-21 ENCOUNTER — Ambulatory Visit (HOSPITAL_COMMUNITY)
Admission: RE | Admit: 2016-01-21 | Discharge: 2016-01-21 | Disposition: A | Discharge status: Home / Self Care | Payer: Medicare Other | Source: Ambulatory Visit | Attending: Vascular Surgery | Admitting: Vascular Surgery

## 2016-01-21 ENCOUNTER — Ambulatory Visit: Payer: Medicare Other | Admitting: Podiatry

## 2016-01-21 VITALS — BP 132/54 | HR 64 | Temp 97.4°F | Resp 18 | Ht 69.0 in | Wt 180.0 lb

## 2016-01-21 DIAGNOSIS — E785 Hyperlipidemia, unspecified: Secondary | ICD-10-CM | POA: Diagnosis not present

## 2016-01-21 DIAGNOSIS — I739 Peripheral vascular disease, unspecified: Secondary | ICD-10-CM | POA: Diagnosis not present

## 2016-01-21 DIAGNOSIS — R0989 Other specified symptoms and signs involving the circulatory and respiratory systems: Secondary | ICD-10-CM | POA: Diagnosis present

## 2016-01-21 DIAGNOSIS — I779 Disorder of arteries and arterioles, unspecified: Secondary | ICD-10-CM | POA: Diagnosis not present

## 2016-01-21 DIAGNOSIS — E1151 Type 2 diabetes mellitus with diabetic peripheral angiopathy without gangrene: Secondary | ICD-10-CM | POA: Insufficient documentation

## 2016-01-21 DIAGNOSIS — I10 Essential (primary) hypertension: Secondary | ICD-10-CM | POA: Insufficient documentation

## 2016-01-21 DIAGNOSIS — Z87891 Personal history of nicotine dependence: Secondary | ICD-10-CM | POA: Diagnosis not present

## 2016-01-21 DIAGNOSIS — Z95828 Presence of other vascular implants and grafts: Secondary | ICD-10-CM | POA: Diagnosis not present

## 2016-01-21 NOTE — Progress Notes (Signed)
[facility] [place]   CC: Follow up peripheral artery occlusive disease  History of Present Illness [name] is a 75 y.o. male patient of Dr. Oneida Alar with a history of left LE claudication.  He is s/p femoral-femoral bypass with bilateral femoral endarterectomy October 2016.  He states his left leg is not as strong as his right since his first CABG. He had veins harvested from his left leg for his CABG in 1987, again in 2005 (vein harvested from right leg), stent in the left main coronary artery.   He denies pain in his calves or thighs with walking, denies non healing wounds.  He denies any history of stroke or TIA.   He continue to take aspirin and plavix since he has had heart surgery.    Pt Diabetic: Yes, states last A1C was 6.2 Pt smoker: former smoker, quit in 1987, started at age 47.  Pt meds include: Statin :Yes Betablocker: Yes ASA: Yes Other anticoagulants/antiplatelets: Plavix  Past Medical History:  Diagnosis Date  . Acute on chronic diastolic CHF (congestive heart failure), NYHA class 1 ([place]) 08/09/2014  . Arthritis    IN FINGERS  . BPH (benign prostatic hyperplasia)   . CAD (coronary artery disease)   . Diabetes mellitus   . Dyslipidemia   . History of acute inferior wall MI   . HTN (hypertension)   . Hyponatremia   . 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 Myocardial infarction    x2  1986, 2005  . PAD (peripheral artery disease) ([place])     Social History Social History  Substance Use Topics  . Smoking status: Former Smoker    Quit date: 02/22/1986  . Smokeless tobacco: Never Used  . Alcohol use 0.0 oz/week     Comment: occ beer    Family History Family History  Problem Relation Age of Onset  . Dementia Mother     Past Surgical History:  Procedure Laterality Date  . CARDIAC CATHETERIZATION  12/23/2014   Procedure: Left Heart Cath and Cors/Grafts Angiography;  Surgeon: Peter M Martinique, MD;  Location: [place] [facility];  Service: Cardiovascular;;  . CORONARY ANGIOPLASTY     cardiac stent   . CORONARY ARTERY BYPASS GRAFT  redo 2005   free Rima to OM, svg-diag,svg-pda  . ENDARTERECTOMY FEMORAL Bilateral 12/31/2014   Procedure: BILATERAL FEMORAL ENDARTERECTOMY;  Surgeon: Elam Dutch, MD;  Location: [place];  Service: Vascular;  Laterality: Bilateral;  . EYE SURGERY     BILATERAL   . FEMORAL-FEMORAL BYPASS GRAFT Bilateral 12/31/2014   Procedure: FEMORAL-FEMORAL ARTERY BYPASS GRAFT;  Surgeon: Elam Dutch, MD;  Location: [place];  Service: Vascular;  Laterality: Bilateral;  . KYPHOPLASTY  02/29/2012   Procedure: KYPHOPLASTY;  Surgeon: Sinclair Ship, MD;  Location: [place];  Service: Orthopedics;  Laterality: Bilateral;  T 10 kyphoplasty  . LAPAROTOMY N/A 08/02/2014   Procedure: EXPLORATORY LAPAROTOMY LYSIS OF ADHESIONS;  Surgeon: Donnie Mesa, MD;  Location: [place];  Service: General;  Laterality: N/A;  . MELANOMA EXCISION    . PERIPHERAL VASCULAR CATHETERIZATION N/A 12/05/2014   Procedure: Abdominal Aortogram;  Surgeon: Elam Dutch, MD;  Location: [place] [facility];  Service: Cardiovascular;  Laterality: N/A;  . ROTATOR CUFF REPAIR    . TONSILLECTOMY      Allergies  Allergen Reactions  . Hctz [Hydrochlorothiazide] Other (See Comments)    Hyponatremia Low potassium     Current Outpatient Prescriptions  Medication Sig  Dispense Refill  . ALPRAZolam (XANAX) 0.5 MG tablet Take 0.5 mg by mouth 3 (three) times daily as needed for anxiety.     Marland Kitchen amLODipine (NORVASC) 5 MG tablet Take 1 tablet (5 mg total) by mouth daily. 90 tablet 0  . aspirin 81 MG tablet Take 81 mg by mouth daily.      . carvedilol (COREG) 12.5 MG tablet TAKE 1 TABLET TWICE A DAY 180 tablet 2  . clopidogrel (PLAVIX) 75 MG tablet Take 1 tablet (75 mg total) by mouth daily. 90 tablet 2  . glimepiride (AMARYL) 4 MG tablet Take 4 mg by mouth daily at 2 PM.     . iron polysaccharides (NIFEREX) 150 MG capsule Take 150 mg by mouth  daily.    Marland Kitchen losartan (COZAAR) 100 MG tablet Take 100 mg by mouth daily.     . nitroGLYCERIN (NITROSTAT) 0.4 MG SL tablet PLACE 1 TABLET (0.4 MG TOTAL) UNDER THE TONGUE EVERY 5 (FIVE) MINUTES AS NEEDED FOR CHEST PAIN. 25 tablet 2  . pioglitazone (ACTOS) 45 MG tablet Take 45 mg by mouth daily.     . simvastatin (ZOCOR) 20 MG tablet Take 1 tablet (20 mg total) by mouth at bedtime. 90 tablet 3   No current facility-administered medications for this visit.     ROS: See HPI for pertinent positives and negatives.   Physical Examination  Vitals:   01/21/16 1359 01/21/16 1402  BP: (!) 130/58 (!) 132/54  Pulse: 62 64  Resp:  18  Temp:  97.4 F (36.3 C)  TempSrc:  Oral  SpO2:  99%  Weight:  180 lb (81.6 kg)  Height:  5\' 9"  (1.753 m)   Body mass index is 26.58 kg/m.  General: A&O x 3, WDWN. Gait: normal Eyes: PERRLA. Pulmonary: Respirations are non labored, CTAB, good air movement Cardiac: regular rhythm, no detected murmur.         Carotid Bruits Right Left   Positive Negative  Aorta is not palpable. Radial pulses: 2+ palpable and = Fem-fem bypass graft pulse is 3+palpable.                           VASCULAR EXAM: Extremities without ischemic changes, without Gangrene; without open wounds.                                                                                                          LE Pulses Right Left       FEMORAL  3+ palpable  2+ palpable        POPLITEAL  not palpable   not palpable       POSTERIOR TIBIAL  not palpable   not palpable        DORSALIS PEDIS      ANTERIOR TIBIAL not palpable  not palpable    Abdomen: soft, NT, no palpable masses. Skin: no rashes, no ulcers noted. Musculoskeletal: no muscle wasting or atrophy.  Neurologic: A&O X 3; Appropriate Affect ; SENSATION: normal; MOTOR FUNCTION:  moving all  extremities equally, motor strength 5/5 throughout. Speech is fluent/normal. CN 2-12 intact.     ASSESSMENT: [name] is a 75 y.o.  male who is s/p femoral-femoral bypass with bilateral femoral endarterectomy October 2016 for a history of left LE intermittent claudication. He has no claudication since the surgery. There are no signs of ischemia in his feet/legs. Fem-fem bypass graft pulse is 3+ palpable.   He has a right carotid bruit, has no history of stroke or TIA, no carotid duplex results on file, see Plan.   DATA TBI's are improved bilaterally to 0.86 on the right, was 0.20 on 07/16/15, and0.62 on the left, was 0.37. Vessels are non compressible bilaterally, ABI's are falsely elevated.  Waveforms are biphasic on the right, monophasic on the left.   PLAN:  Based on the patient's vascular studies and examination, pt will return to clinic in 6 months with ABI's and carotid duplex.  I discussed in depth with the patient the nature of atherosclerosis, and emphasized the importance of maximal medical management including strict control of blood pressure, blood glucose, and lipid levels, obtaining regular exercise, and continued cessation of smoking.  The patient is aware that without maximal medical management the underlying atherosclerotic disease process will progress, limiting the benefit of any interventions.  The patient was given information about PAD including signs, symptoms, treatment, what symptoms should prompt the patient to seek immediate medical care, and risk reduction measures to take.  Clemon Chambers, RN, MSN, FNP-C [facility] Phone: [phone]  Clinic MD: Oneida Alar  01/21/16 2:20 PM

## 2016-01-21 NOTE — Patient Instructions (Signed)
Peripheral Vascular Disease Peripheral vascular disease (PVD) is a disease of the blood vessels that are not part of your heart and brain. A simple term for PVD is poor circulation. In most cases, PVD narrows the blood vessels that carry blood from your heart to the rest of your body. This can result in a decreased supply of blood to your arms, legs, and internal organs, like your stomach or kidneys. However, it most often affects a person's lower legs and feet. There are two types of PVD.  Organic PVD. This is the more common type. It is caused by damage to the structure of blood vessels.  Functional PVD. This is caused by conditions that make blood vessels contract and tighten (spasm). Without treatment, PVD tends to get worse over time. PVD can also lead to acute ischemic limb. This is when an arm or limb suddenly has trouble getting enough blood. This is a medical emergency. CAUSES Each type of PVD has many different causes. The most common cause of PVD is buildup of a fatty material (plaque) inside of your arteries (atherosclerosis). Small amounts of plaque can break off from the walls of the blood vessels and become lodged in a smaller artery. This blocks blood flow and can cause acute ischemic limb. Other common causes of PVD include:  Blood clots that form inside of blood vessels.  Injuries to blood vessels.  Diseases that cause inflammation of blood vessels or cause blood vessel spasms.  Health behaviors and health history that increase your risk of developing PVD. RISK FACTORS  You may have a greater risk of PVD if you:  Have a family history of PVD.  Have certain medical conditions, including:  High cholesterol.  Diabetes.  High blood pressure (hypertension).  Coronary heart disease.  Past problems with blood clots.  Past injury, such as burns or a broken bone. These may have damaged blood vessels in your limbs.  Buerger disease. This is caused by inflamed blood  vessels in your hands and feet.  Some forms of arthritis.  Rare birth defects that affect the arteries in your legs.  Use tobacco.  Do not get enough exercise.  Are obese.  Are age 50 or older. SIGNS AND SYMPTOMS  PVD may cause many different symptoms. Your symptoms depend on what part of your body is not getting enough blood. Some common signs and symptoms include:  Cramps in your lower legs. This may be a symptom of poor leg circulation (claudication).  Pain and weakness in your legs while you are physically active that goes away when you rest (intermittent claudication).  Leg pain when at rest.  Leg numbness, tingling, or weakness.  Coldness in a leg or foot, especially when compared with the other leg.  Skin or hair changes. These can include:  Hair loss.  Shiny skin.  Pale or bluish skin.  Thick toenails.  Inability to get or maintain an erection (erectile dysfunction). People with PVD are more prone to developing ulcers and sores on their toes, feet, or legs. These may take longer than normal to heal. DIAGNOSIS Your health care provider may diagnose PVD from your signs and symptoms. The health care provider will also do a physical exam. You may have tests to find out what is causing your PVD and determine its severity. Tests may include:  Blood pressure recordings from your arms and legs and measurements of the strength of your pulses (pulse volume recordings).  Imaging studies using sound waves to take pictures of   the blood flow through your blood vessels (Doppler ultrasound).  Injecting a dye into your blood vessels before having imaging studies using:  X-rays (angiogram or arteriogram).  Computer-generated X-rays (CT angiogram).  A powerful electromagnetic field and a computer (magnetic resonance angiogram or MRA). TREATMENT Treatment for PVD depends on the cause of your condition and the severity of your symptoms. It also depends on your age. Underlying  causes need to be treated and controlled. These include long-lasting (chronic) conditions, such as diabetes, high cholesterol, and high blood pressure. You may need to first try making lifestyle changes and taking medicines. Surgery may be needed if these do not work. Lifestyle changes may include:  Quitting smoking.  Exercising regularly.  Following a low-fat, low-cholesterol diet. Medicines may include:  Blood thinners to prevent blood clots.  Medicines to improve blood flow.  Medicines to improve your blood cholesterol levels. Surgical procedures may include:  A procedure that uses an inflated balloon to open a blocked artery and improve blood flow (angioplasty).  A procedure to put in a tube (stent) to keep a blocked artery open (stent implant).  Surgery to reroute blood flow around a blocked artery (peripheral bypass surgery).  Surgery to remove dead tissue from an infected wound on the affected limb.  Amputation. This is surgical removal of the affected limb. This may be necessary in cases of acute ischemic limb that are not improved through medical or surgical treatments. HOME CARE INSTRUCTIONS  Take medicines only as directed by your health care provider.  Do not use any tobacco products, including cigarettes, chewing tobacco, or electronic cigarettes. If you need help quitting, ask your health care provider.  Lose weight if you are overweight, and maintain a healthy weight as directed by your health care provider.  Eat a diet that is low in fat and cholesterol. If you need help, ask your health care provider.  Exercise regularly. Ask your health care provider to suggest some good activities for you.  Use compression stockings or other mechanical devices as directed by your health care provider.  Take good care of your feet.  Wear comfortable shoes that fit well.  Check your feet often for any cuts or sores. SEEK MEDICAL CARE IF:  You have cramps in your legs  while walking.  You have leg pain when you are at rest.  You have coldness in a leg or foot.  Your skin changes.  You have erectile dysfunction.  You have cuts or sores on your feet that are not healing. SEEK IMMEDIATE MEDICAL CARE IF:  Your arm or leg turns cold and blue.  Your arms or legs become red, warm, swollen, painful, or numb.  You have chest pain or trouble breathing.  You suddenly have weakness in your face, arm, or leg.  You become very confused or lose the ability to speak.  You suddenly have a very bad headache or lose your vision.   This information is not intended to replace advice given to you by your health care provider. Make sure you discuss any questions you have with your health care provider.   Document Released: 04/21/2004 Document Revised: 04/04/2014 Document Reviewed: 08/22/2013 Elsevier Interactive Patient Education 2016 [employer]  

## 2016-02-09 NOTE — Addendum Note (Signed)
Addended by: Thresa Ross C on: 02/09/2016 02:04 PM   Modules accepted: Orders

## 2016-02-12 ENCOUNTER — Other Ambulatory Visit (HOSPITAL_COMMUNITY)
Admission: RE | Admit: 2016-02-12 | Discharge: 2016-02-12 | Disposition: A | Discharge status: Home / Self Care | Payer: Medicare Other | Source: Other Acute Inpatient Hospital | Attending: Urology | Admitting: Urology

## 2016-02-12 ENCOUNTER — Ambulatory Visit (INDEPENDENT_AMBULATORY_CARE_PROVIDER_SITE_OTHER): Payer: Medicare Other | Admitting: Urology

## 2016-02-12 DIAGNOSIS — N390 Urinary tract infection, site not specified: Secondary | ICD-10-CM

## 2016-02-12 DIAGNOSIS — R3121 Asymptomatic microscopic hematuria: Secondary | ICD-10-CM

## 2016-02-12 DIAGNOSIS — N401 Enlarged prostate with lower urinary tract symptoms: Secondary | ICD-10-CM | POA: Diagnosis not present

## 2016-02-12 DIAGNOSIS — R351 Nocturia: Secondary | ICD-10-CM

## 2016-02-12 LAB — URINE MICROSCOPIC-ADD ON

## 2016-02-12 LAB — URINALYSIS, ROUTINE W REFLEX MICROSCOPIC
Bilirubin Urine: NEGATIVE
Glucose, UA: NEGATIVE mg/dL
Ketones, ur: NEGATIVE mg/dL
NITRITE: NEGATIVE
PROTEIN: 30 mg/dL — AB
Specific Gravity, Urine: 1.02 (ref 1.005–1.030)
pH: 6 (ref 5.0–8.0)

## 2016-02-15 LAB — URINE CULTURE: Culture: >=100000 — AB

## 2016-03-24 ENCOUNTER — Ambulatory Visit: Payer: Medicare Other | Admitting: Podiatry

## 2016-03-29 ENCOUNTER — Ambulatory Visit: Payer: Medicare Other | Admitting: Podiatry

## 2016-03-31 ENCOUNTER — Observation Stay (HOSPITAL_COMMUNITY)
Admission: EM | Admit: 2016-03-31 | Discharge: 2016-04-03 | Disposition: A | Discharge status: Home / Self Care | Payer: Medicare Other | Attending: Internal Medicine | Admitting: Internal Medicine

## 2016-03-31 ENCOUNTER — Encounter (HOSPITAL_COMMUNITY): Payer: Self-pay | Admitting: Emergency Medicine

## 2016-03-31 ENCOUNTER — Emergency Department (HOSPITAL_COMMUNITY): Payer: Medicare Other

## 2016-03-31 DIAGNOSIS — Z87891 Personal history of nicotine dependence: Secondary | ICD-10-CM | POA: Insufficient documentation

## 2016-03-31 DIAGNOSIS — Z951 Presence of aortocoronary bypass graft: Secondary | ICD-10-CM | POA: Diagnosis not present

## 2016-03-31 DIAGNOSIS — I2510 Atherosclerotic heart disease of native coronary artery without angina pectoris: Secondary | ICD-10-CM | POA: Diagnosis present

## 2016-03-31 DIAGNOSIS — Z85820 Personal history of malignant melanoma of skin: Secondary | ICD-10-CM | POA: Insufficient documentation

## 2016-03-31 DIAGNOSIS — Z79899 Other long term (current) drug therapy: Secondary | ICD-10-CM | POA: Insufficient documentation

## 2016-03-31 DIAGNOSIS — E1151 Type 2 diabetes mellitus with diabetic peripheral angiopathy without gangrene: Secondary | ICD-10-CM | POA: Insufficient documentation

## 2016-03-31 DIAGNOSIS — E785 Hyperlipidemia, unspecified: Secondary | ICD-10-CM

## 2016-03-31 DIAGNOSIS — Z7982 Long term (current) use of aspirin: Secondary | ICD-10-CM | POA: Insufficient documentation

## 2016-03-31 DIAGNOSIS — I252 Old myocardial infarction: Secondary | ICD-10-CM | POA: Insufficient documentation

## 2016-03-31 DIAGNOSIS — F419 Anxiety disorder, unspecified: Secondary | ICD-10-CM | POA: Diagnosis not present

## 2016-03-31 DIAGNOSIS — I5042 Chronic combined systolic (congestive) and diastolic (congestive) heart failure: Secondary | ICD-10-CM | POA: Diagnosis present

## 2016-03-31 DIAGNOSIS — I110 Hypertensive heart disease with heart failure: Secondary | ICD-10-CM | POA: Insufficient documentation

## 2016-03-31 DIAGNOSIS — I4891 Unspecified atrial fibrillation: Principal | ICD-10-CM | POA: Diagnosis present

## 2016-03-31 DIAGNOSIS — I10 Essential (primary) hypertension: Secondary | ICD-10-CM | POA: Diagnosis present

## 2016-03-31 DIAGNOSIS — R011 Cardiac murmur, unspecified: Secondary | ICD-10-CM

## 2016-03-31 DIAGNOSIS — I25810 Atherosclerosis of coronary artery bypass graft(s) without angina pectoris: Secondary | ICD-10-CM

## 2016-03-31 DIAGNOSIS — D649 Anemia, unspecified: Secondary | ICD-10-CM | POA: Diagnosis not present

## 2016-03-31 DIAGNOSIS — R05 Cough: Secondary | ICD-10-CM | POA: Diagnosis not present

## 2016-03-31 DIAGNOSIS — Z7902 Long term (current) use of antithrombotics/antiplatelets: Secondary | ICD-10-CM | POA: Insufficient documentation

## 2016-03-31 DIAGNOSIS — E1142 Type 2 diabetes mellitus with diabetic polyneuropathy: Secondary | ICD-10-CM | POA: Diagnosis present

## 2016-03-31 DIAGNOSIS — I480 Paroxysmal atrial fibrillation: Secondary | ICD-10-CM | POA: Diagnosis present

## 2016-03-31 DIAGNOSIS — Z7984 Long term (current) use of oral hypoglycemic drugs: Secondary | ICD-10-CM | POA: Diagnosis not present

## 2016-03-31 DIAGNOSIS — I5043 Acute on chronic combined systolic (congestive) and diastolic (congestive) heart failure: Secondary | ICD-10-CM | POA: Diagnosis not present

## 2016-03-31 HISTORY — DX: ST elevation (STEMI) myocardial infarction involving other coronary artery of inferior wall: I21.19

## 2016-03-31 HISTORY — DX: Type 2 diabetes mellitus without complications: E11.9

## 2016-03-31 HISTORY — DX: Personal history of other medical treatment: Z92.89

## 2016-03-31 HISTORY — DX: Anemia, unspecified: D64.9

## 2016-03-31 HISTORY — DX: Unspecified osteoarthritis, unspecified site: M19.90

## 2016-03-31 LAB — BASIC METABOLIC PANEL
Anion gap: 10 (ref 5–15)
BUN: 18 mg/dL (ref 6–20)
CALCIUM: 9.4 mg/dL (ref 8.9–10.3)
CHLORIDE: 102 mmol/L (ref 101–111)
CO2: 23 mmol/L (ref 22–32)
CREATININE: 1.2 mg/dL (ref 0.61–1.24)
GFR calc non Af Amer: 57 mL/min — ABNORMAL LOW (ref >60)
Glucose, Bld: 164 mg/dL — ABNORMAL HIGH (ref 65–99)
Potassium: 4 mmol/L (ref 3.5–5.1)
Sodium: 135 mmol/L (ref 135–145)

## 2016-03-31 LAB — T4, FREE: FREE T4: 1.38 ng/dL — AB (ref 0.61–1.12)

## 2016-03-31 LAB — TROPONIN I
TROPONIN I: 0.03 ng/mL — AB (ref <0.03)
Troponin I: 0.05 ng/mL (ref <0.03)

## 2016-03-31 LAB — CBC
HCT: 29.8 % — ABNORMAL LOW (ref 39.0–52.0)
Hemoglobin: 10.1 g/dL — ABNORMAL LOW (ref 13.0–17.0)
MCH: 31 pg (ref 26.0–34.0)
MCHC: 33.9 g/dL (ref 30.0–36.0)
MCV: 91.4 fL (ref 78.0–100.0)
PLATELETS: 201 10*3/uL (ref 150–400)
RBC: 3.26 MIL/uL — AB (ref 4.22–5.81)
RDW: 14.2 % (ref 11.5–15.5)
WBC: 6.5 10*3/uL (ref 4.0–10.5)

## 2016-03-31 LAB — GLUCOSE, CAPILLARY: GLUCOSE-CAPILLARY: 124 mg/dL — AB (ref 65–99)

## 2016-03-31 LAB — TSH: TSH: 1.009 u[IU]/mL (ref 0.350–4.500)

## 2016-03-31 LAB — BRAIN NATRIURETIC PEPTIDE: B Natriuretic Peptide: 225.4 pg/mL — ABNORMAL HIGH (ref 0.0–100.0)

## 2016-03-31 MED ORDER — SIMVASTATIN 20 MG PO TABS
20.0000 mg | ORAL_TABLET | Freq: Every day | ORAL | Status: DC
  Administered 2016-04-01 – 2016-04-02 (×2): 20 mg via ORAL
  Filled 2016-03-31 (×2): qty 1

## 2016-03-31 MED ORDER — GUAIFENESIN-CODEINE 100-10 MG/5ML PO SOLN
5.0000 mL | ORAL | Status: DC | PRN
  Administered 2016-03-31 – 2016-04-03 (×9): 5 mL via ORAL
  Filled 2016-03-31 (×9): qty 5

## 2016-03-31 MED ORDER — ONDANSETRON HCL 4 MG/2ML IJ SOLN: 4.0000 mg | Freq: Four times a day (QID) | INTRAMUSCULAR | Status: DC | PRN

## 2016-03-31 MED ORDER — HYDROCODONE-ACETAMINOPHEN 5-325 MG PO TABS: 1.0000 | ORAL_TABLET | Freq: Four times a day (QID) | ORAL | Status: DC | PRN

## 2016-03-31 MED ORDER — POLYSACCHARIDE IRON COMPLEX 150 MG PO CAPS
150.0000 mg | ORAL_CAPSULE | Freq: Every day | ORAL | Status: DC
  Administered 2016-03-31 – 2016-04-02 (×3): 150 mg via ORAL
  Filled 2016-03-31 (×3): qty 1

## 2016-03-31 MED ORDER — GUAIFENESIN-DM 100-10 MG/5ML PO SYRP
5.0000 mL | ORAL_SOLUTION | ORAL | Status: DC | PRN
  Administered 2016-04-01 – 2016-04-03 (×2): 5 mL via ORAL
  Filled 2016-03-31 (×2): qty 5

## 2016-03-31 MED ORDER — CLOPIDOGREL BISULFATE 75 MG PO TABS
75.0000 mg | ORAL_TABLET | Freq: Every day | ORAL | Status: DC
  Administered 2016-04-01 – 2016-04-02 (×2): 75 mg via ORAL
  Filled 2016-03-31 (×2): qty 1

## 2016-03-31 MED ORDER — ASPIRIN EC 81 MG PO TBEC
81.0000 mg | DELAYED_RELEASE_TABLET | Freq: Every day | ORAL | Status: DC
  Administered 2016-04-01 – 2016-04-02 (×2): 81 mg via ORAL
  Filled 2016-03-31 (×2): qty 1

## 2016-03-31 MED ORDER — AMLODIPINE BESYLATE 5 MG PO TABS
5.0000 mg | ORAL_TABLET | Freq: Every day | ORAL | Status: DC
  Administered 2016-04-01: 5 mg via ORAL
  Filled 2016-03-31: qty 1

## 2016-03-31 MED ORDER — ENOXAPARIN SODIUM 40 MG/0.4ML ~~LOC~~ SOLN
40.0000 mg | SUBCUTANEOUS | Status: DC
  Administered 2016-03-31 – 2016-04-01 (×2): 40 mg via SUBCUTANEOUS
  Filled 2016-03-31 (×2): qty 0.4

## 2016-03-31 MED ORDER — FUROSEMIDE 10 MG/ML IJ SOLN
20.0000 mg | Freq: Once | INTRAMUSCULAR | Status: AC
  Administered 2016-03-31: 20 mg via INTRAVENOUS
  Filled 2016-03-31: qty 2

## 2016-03-31 MED ORDER — ACETAMINOPHEN 325 MG PO TABS: 650.0000 mg | ORAL_TABLET | ORAL | Status: DC | PRN

## 2016-03-31 MED ORDER — DILTIAZEM HCL 100 MG IV SOLR
5.0000 mg/h | INTRAVENOUS | Status: DC
  Filled 2016-03-31: qty 100

## 2016-03-31 MED ORDER — ZOLPIDEM TARTRATE 5 MG PO TABS: 5.0000 mg | ORAL_TABLET | Freq: Every evening | ORAL | Status: DC | PRN

## 2016-03-31 MED ORDER — DILTIAZEM HCL 100 MG IV SOLR
5.0000 mg/h | INTRAVENOUS | Status: DC
  Administered 2016-03-31: 5 mg/h via INTRAVENOUS
  Filled 2016-03-31: qty 100

## 2016-03-31 MED ORDER — ALPRAZOLAM 0.5 MG PO TABS
0.5000 mg | ORAL_TABLET | Freq: Three times a day (TID) | ORAL | Status: DC | PRN
Start: 2016-03-31 — End: 2016-04-03
  Administered 2016-03-31 – 2016-04-01 (×2): 0.5 mg via ORAL
  Filled 2016-03-31 (×2): qty 1

## 2016-03-31 MED ORDER — DILTIAZEM LOAD VIA INFUSION
15.0000 mg | Freq: Once | INTRAVENOUS | Status: AC
  Administered 2016-03-31: 15 mg via INTRAVENOUS
  Filled 2016-03-31: qty 15

## 2016-03-31 NOTE — ED Notes (Signed)
EKG given to Dr. Campos 

## 2016-03-31 NOTE — ED Notes (Signed)
Pt was transported from hallway to x-ray prior to EKG being done. Transport to bring patient to room 16. MD aware.

## 2016-03-31 NOTE — ED Provider Notes (Addendum)
[facility] DEPT Provider Note   CSN: [id number] Arrival date & time: 03/31/16  1534     History   Chief Complaint Chief Complaint  Patient presents with  . Atrial Fibrillation    HPI [name] is a 76 y.o. male.  HPI Patient has a history of coronary artery disease status post CABG as well as a history of diastolic congestive heart failure who presents to the emergency department from the [facility] walking clinic where he is found to be in atrial fibrillation with rapid ventricular response.  He states he's had cough over the past several days with new orthopnea.  Denies significant dyspnea on exertion.  Denies palpitations.  He's never had atrial fibrillation before.  Reports no fevers or chills.  Reports she does have some mucus production with this cough.  Denies unilateral leg swelling.  He is compliant with his medications.  He presents with a heart rate of the 120s and is in atrial fibrillation   Past Medical History:  Diagnosis Date  . Acute on chronic diastolic CHF (congestive heart failure), NYHA class 1 ([place]) 08/09/2014  . Arthritis    IN FINGERS  . BPH (benign prostatic hyperplasia)   . CAD (coronary artery disease)   . Diabetes mellitus   . Dyslipidemia   . History of acute inferior wall MI   . HTN (hypertension)   . Hyponatremia   . 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 Myocardial infarction    x2  1986, 2005  . PAD (peripheral artery disease) [facility])     Patient Active Problem List   Diagnosis Date Noted  . Anemia 03/19/2015  . Altered blood in stool 03/19/2015  . Peripheral vascular disease ([place]) 03/19/2015  . Essential (primary) hypertension 03/19/2015  . Hypo-osmolality and hyponatremia 03/19/2015  . 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 Osteoarthritis 03/19/2015  . Type 2 diabetes mellitus with diabetic polyneuropathy ([place]) 03/19/2015  . Abnormal nuclear stress test 12/23/2014  . PAD (peripheral artery disease) ([place]) 12/11/2014  .  Anxiety disorder 10/22/2014  . CAP (community acquired pneumonia) 07/29/2014  . Hypokalemia 07/29/2014  . SBO (small bowel obstruction) 07/25/2014  . Partial small bowel obstruction 07/25/2014  . 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 CAD (coronary artery disease)   . History of acute inferior wall MI   . Diabetes mellitus ([place])   . HTN (hypertension)   . Dyslipidemia     Past Surgical History:  Procedure Laterality Date  . CARDIAC CATHETERIZATION  12/23/2014   Procedure: Left Heart Cath and Cors/Grafts Angiography;  Surgeon: Peter M Martinique, MD;  Location: [place] [facility];  Service: Cardiovascular;;  . CORONARY ANGIOPLASTY     cardiac stent   . CORONARY ARTERY BYPASS GRAFT  redo 2005   free Rima to OM, svg-diag,svg-pda  . ENDARTERECTOMY FEMORAL Bilateral 12/31/2014   Procedure: BILATERAL FEMORAL ENDARTERECTOMY;  Surgeon: Elam Dutch, MD;  Location: [place];  Service: Vascular;  Laterality: Bilateral;  . EYE SURGERY     BILATERAL   . FEMORAL-FEMORAL BYPASS GRAFT Bilateral 12/31/2014   Procedure: FEMORAL-FEMORAL ARTERY BYPASS GRAFT;  Surgeon: Elam Dutch, MD;  Location: [place];  Service: Vascular;  Laterality: Bilateral;  . KYPHOPLASTY  02/29/2012   Procedure: KYPHOPLASTY;  Surgeon: Sinclair Ship, MD;  Location: [place];  Service: Orthopedics;  Laterality: Bilateral;  T 10 kyphoplasty  . LAPAROTOMY N/A 08/02/2014   Procedure: EXPLORATORY LAPAROTOMY LYSIS OF ADHESIONS;  Surgeon: Donnie Mesa, MD;  [facility];  Service: General;  Laterality: N/A;  . MELANOMA EXCISION    . PERIPHERAL VASCULAR CATHETERIZATION N/A 12/05/2014   Procedure: Abdominal Aortogram;  Surgeon: Elam Dutch, MD;  Location: [place] [facility];  Service: Cardiovascular;  Laterality: N/A;  . ROTATOR CUFF REPAIR    . TONSILLECTOMY         Home Medications    Prior to Admission medications   Medication Sig Start Date End Date Taking? Authorizing Provider  ALPRAZolam Duanne Moron) 0.5  MG tablet Take 0.5 mg by mouth 3 (three) times daily as needed for anxiety.     Historical Provider, MD  amLODipine (NORVASC) 5 MG tablet Take 1 tablet (5 mg total) by mouth daily. 12/17/14   Peter M Martinique, MD  aspirin 81 MG tablet Take 81 mg by mouth daily.      Historical Provider, MD  carvedilol (COREG) 12.5 MG tablet TAKE 1 TABLET TWICE A DAY 02/22/11   Peter M Martinique, MD  clopidogrel (PLAVIX) 75 MG tablet Take 1 tablet (75 mg total) by mouth daily. 06/11/13   Peter M Martinique, MD  glimepiride (AMARYL) 4 MG tablet Take 4 mg by mouth daily at 2 PM.     Historical Provider, MD  iron polysaccharides (NIFEREX) 150 MG capsule Take 150 mg by mouth daily.    Historical Provider, MD  losartan (COZAAR) 100 MG tablet Take 100 mg by mouth daily.  11/05/12   Historical Provider, MD  nitroGLYCERIN (NITROSTAT) 0.4 MG SL tablet PLACE 1 TABLET (0.4 MG TOTAL) UNDER THE TONGUE EVERY 5 (FIVE) MINUTES AS NEEDED FOR CHEST PAIN. 01/04/16   Peter M Martinique, MD  pioglitazone (ACTOS) 45 MG tablet Take 45 mg by mouth daily.  04/13/12   Historical Provider, MD  simvastatin (ZOCOR) 20 MG tablet Take 1 tablet (20 mg total) by mouth at bedtime. 10/21/15   Peter M Martinique, MD    Family History Family History  Problem Relation Age of Onset  . Dementia Mother     Social History Social History  Substance Use Topics  . Smoking status: Former Smoker    Quit date: 02/22/1986  . Smokeless tobacco: Never Used  . Alcohol use 0.0 oz/week     Comment: occ beer     Allergies   Hctz [hydrochlorothiazide]   Review of Systems Review of Systems  All other systems reviewed and are negative.    Physical Exam Updated Vital Signs BP (!) 134/41   Pulse (!) 44   Temp 98 F (36.7 C) (Oral)   Resp 18   SpO2 98%   Physical Exam  Constitutional: He is oriented to person, place, and time. He appears well-developed and well-nourished.  HENT:  Head: Normocephalic and atraumatic.  Eyes: EOM are normal.  Neck: Normal range of  motion.  Cardiovascular: Normal heart sounds and intact distal pulses.   Irregularly irregular rhythm,  tachycardic rate  Pulmonary/Chest: Effort normal and breath sounds normal. No respiratory distress.  Abdominal: Soft. He exhibits no distension. There is no tenderness.  Musculoskeletal: Normal range of motion.  Neurological: He is alert and oriented to person, place, and time.  Skin: Skin is warm and dry.  Psychiatric: He has a normal mood and affect. Judgment normal.  Nursing note and vitals reviewed.    ED Treatments / Results  Labs (all labs ordered are listed, but only abnormal results are displayed) Labs Reviewed  BASIC METABOLIC PANEL - Abnormal; Notable for the following:       Result  Value   Glucose, Bld 164 (*)    GFR calc non Af Amer 57 (*)    All other components within normal limits  CBC - Abnormal; Notable for the following:    RBC 3.26 (*)    Hemoglobin 10.1 (*)    HCT 29.8 (*)    All other components within normal limits  TROPONIN I - Abnormal; Notable for the following:    Troponin I 0.03 (*)    All other components within normal limits  BRAIN NATRIURETIC PEPTIDE - Abnormal; Notable for the following:    B Natriuretic Peptide 225.4 (*)    All other components within normal limits    EKG  EKG Interpretation  Date/Time:  Thursday March 31 2016 16:32:57 EST Ventricular Rate:  74 PR Interval:    QRS Duration: 92 QT Interval:  379 QTC Calculation: 421 R Axis:   57 Text Interpretation:  Atrial fibrillation Repol abnrm suggests ischemia, lateral leads Borderline ST elevation, inferior leads afib is new since prior tracing Confirmed by Tamara Kenyon  MD, Lennette Bihari ([id number]) on 03/31/2016 5:56:58 PM       Radiology Dg Chest 2 View  Result Date: 03/31/2016 CLINICAL DATA:  Cough. EXAM: CHEST  2 VIEW COMPARISON:  07/26/2014 . FINDINGS: Mediastinum and hilar structures normal. Prior CABG. Cardiomegaly with mild pulmonary vascular prominence and interstitial prominence  consistent with mild CHF. Previously identified infiltrate left upper lobe has cleared. Mid thoracic vertebral body compression fracture. Prior vertebroplasty . IMPRESSION: 1. Prior CABG. Cardiomegaly with mild interstitial prominence consistent mild CHF. 2. Previously identified left upper lobe infiltrate noted on chest x-ray of 07/26/2014 has cleared. Changes of pleuroparenchymal scarring again noted. Electronically Signed   By: Marcello Moores  Register   On: 03/31/2016 16:17    Procedures Procedures (including critical care time)  Medications Ordered in ED Medications  diltiazem (CARDIZEM) 1 mg/mL load via infusion 15 mg (not administered)    And  diltiazem (CARDIZEM) 100 mg in dextrose 5 % 100 mL (1 mg/mL) infusion (not administered)     Initial Impression / Assessment and Plan / ED Course  I have reviewed the triage vital signs and the nursing notes.  Pertinent labs & imaging results that were available during my care of the patient were reviewed by me and considered in my medical decision making (see chart for details).  Clinical Course    Primary cardiologist: Dr. Peter Martinique  Atrial fibrillation with rapid ventricular response.  This is new onset A. fib with him with likely mild CHF exacerbation.  Patient will receive IV Lasix now.  Rate control with IV Cardizem.  Patient be admitted the hospital.  I'll discuss case with cardiology  Cardiology: Dr Fransico Him consulted. Will consult. Recommends hospitalist consultation.   Final Clinical Impressions(s) / ED Diagnoses   Final diagnoses:  Atrial fibrillation with RVR [facility])    New Prescriptions New Prescriptions   No medications on file     Jola Schmidt, MD 03/31/16 [place], MD 03/31/16 [phone]

## 2016-03-31 NOTE — H&P (Signed)
History and Physical    QUANTEL DRISKEL [id number] DOB: 1941/01/07 DOA: 03/31/2016  PCP: Melinda Crutch, MD   Patient coming from: Home, by way of PCP office   Chief Complaint: New a fib with rapid rate   HPI: [name] is a 76 y.o. male with medical history significant for hypertension, type 2 diabetes mellitus, peripheral arterial disease, coronary artery disease status post CABG, and chronic combined systolic/diastolic CHF who presents to the emergency department at the direction of his primary care physician for evaluation of new onset atrial fibrillation with RVR. Patient reports that he had been in his usual state of health until approximately one week ago when he developed upper respiratory symptoms with sore throat, nasal congestion, and cough. Since that time, the congestion and sore throat have improved significantly, but he has a persisting cough. He was evaluated today and that his primary care clinic for the persistent cough and noted to have a pulse rate in the 130s. EKG was obtained, revealing new atrial fibrillation with RVR. Patient denies any chest pain or palpitations associated with this and denies any significant dyspnea. He describes his cough is nonproductive and denies any fevers, chills, or lightheadedness. He has been taking over-the-counter cough syrups and lozenges without any relief.    ED Course: Upon arrival to the ED, patient is found to be afebrile, saturating well on room air, and with vital signs stable. EKG demonstrates atrial fibrillation with nonspecific T-wave abnormality in the inferior leads. Chest x-ray is notable for cardiomegaly and mild interstitial prominence consistent with mild CHF. Chemistry panels notable for serum creatinine 1.20, up from an apparent baseline of approximately 1. CBC features a normocytic anemia with hemoglobin of 10.1, up from recent priors in the 9 range. Troponin is borderline elevated at 0.03 and BNP is elevated to 225. Patient was  treated with a IV push of diltiazem and started on diltiazem infusion. Cardiology was consulted by the ED physician, agreed to consult on the patient, but requested a medical admission. Patient has remained hemodynamically stable in the ED with normalization of his heart rate on diltiazem infusion. He will be observed on the telemetry unit for ongoing evaluation and management of new onset atrial fibrillation with RVR, and mild acute on chronic combined CHF.  Review of Systems:  All other systems reviewed and apart from HPI, are negative.  Past Medical History:  Diagnosis Date  . Acute on chronic diastolic CHF (congestive heart failure), NYHA class 1 ([place]) 08/09/2014  . Arthritis    IN FINGERS  . BPH (benign prostatic hyperplasia)   . CAD (coronary artery disease)   . Diabetes mellitus   . Dyslipidemia   . History of acute inferior wall MI   . HTN (hypertension)   . Hyponatremia   . 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 Myocardial infarction    x2  1986, 2005  . PAD (peripheral artery disease) ([place])     Past Surgical History:  Procedure Laterality Date  . CARDIAC CATHETERIZATION  12/23/2014   Procedure: Left Heart Cath and Cors/Grafts Angiography;  Surgeon: Peter M Martinique, MD;  Location: [place] [facility];  Service: Cardiovascular;;  . CORONARY ANGIOPLASTY     cardiac stent   . CORONARY ARTERY BYPASS GRAFT  redo 2005   free Rima to OM, svg-diag,svg-pda  . ENDARTERECTOMY FEMORAL Bilateral 12/31/2014   Procedure: BILATERAL FEMORAL ENDARTERECTOMY;  Surgeon: Elam Dutch, MD;  Location: [place];  Service: Vascular;  Laterality: Bilateral;  .  EYE SURGERY     BILATERAL   . FEMORAL-FEMORAL BYPASS GRAFT Bilateral 12/31/2014   Procedure: FEMORAL-FEMORAL ARTERY BYPASS GRAFT;  Surgeon: Elam Dutch, MD;  Location: [place];  Service: Vascular;  Laterality: Bilateral;  . KYPHOPLASTY  02/29/2012   Procedure: KYPHOPLASTY;  Surgeon: Sinclair Ship, MD;  Location: [place];  Service:  Orthopedics;  Laterality: Bilateral;  T 10 kyphoplasty  . LAPAROTOMY N/A 08/02/2014   Procedure: EXPLORATORY LAPAROTOMY LYSIS OF ADHESIONS;  Surgeon: Donnie Mesa, MD;  Location: [place];  Service: General;  Laterality: N/A;  . MELANOMA EXCISION    . PERIPHERAL VASCULAR CATHETERIZATION N/A 12/05/2014   Procedure: Abdominal Aortogram;  Surgeon: Elam Dutch, MD;  Location: [place] [facility];  Service: Cardiovascular;  Laterality: N/A;  . ROTATOR CUFF REPAIR    . TONSILLECTOMY       reports that he quit smoking about 30 years ago. He has never used smokeless tobacco. He reports that he drinks alcohol. He reports that he does not use drugs.  Allergies  Allergen Reactions  . Hctz [Hydrochlorothiazide] Other (See Comments)    Hypokalemia     Family History  Problem Relation Age of Onset  . Dementia Mother      Prior to Admission medications   Medication Sig Start Date End Date Taking? Authorizing Provider  ALPRAZolam Duanne Moron) 0.5 MG tablet Take 0.5 mg by mouth 3 (three) times daily as needed for anxiety.    Yes Historical Provider, MD  amLODipine (NORVASC) 5 MG tablet Take 1 tablet (5 mg total) by mouth daily. 12/17/14  Yes Peter M Martinique, MD  aspirin 81 MG tablet Take 81 mg by mouth daily.     Yes Historical Provider, MD  clopidogrel (PLAVIX) 75 MG tablet Take 1 tablet (75 mg total) by mouth daily. 06/11/13  Yes Peter M Martinique, MD  glimepiride (AMARYL) 4 MG tablet Take 4 mg by mouth daily at 2 PM.    Yes Historical Provider, MD  guaifenesin (ROBITUSSIN) 100 MG/5ML syrup Take 200 mg by mouth 3 (three) times daily as needed for cough.   Yes Historical Provider, MD  iron polysaccharides (NIFEREX) 150 MG capsule Take 150 mg by mouth at bedtime.    Yes Historical Provider, MD  losartan (COZAAR) 100 MG tablet Take 100 mg by mouth daily.  11/05/12  Yes Historical Provider, MD  nitroGLYCERIN (NITROSTAT) 0.4 MG SL tablet PLACE 1 TABLET (0.4 MG TOTAL) UNDER THE TONGUE EVERY 5 (FIVE) MINUTES AS  NEEDED FOR CHEST PAIN. 01/04/16  Yes Peter M Martinique, MD  pioglitazone (ACTOS) 45 MG tablet Take 45 mg by mouth daily.  04/13/12  Yes Historical Provider, MD  simvastatin (ZOCOR) 20 MG tablet Take 1 tablet (20 mg total) by mouth at bedtime. 10/21/15  Yes Peter M Martinique, MD  Throat Lozenges (COUGH DROPS MT) Use as directed 1 lozenge in the mouth or throat daily as needed (cough).   Yes Historical Provider, MD  carvedilol (COREG) 12.5 MG tablet TAKE 1 TABLET TWICE A DAY 02/22/11   Peter M Martinique, MD    Physical Exam: Vitals:   03/31/16 1745 03/31/16 1800 03/31/16 1936 03/31/16 2006  BP: 108/68 (!) 129/103 108/55   Pulse: 71 83 91   Resp: (!) 29 18 21    Temp:      TempSrc:      SpO2: 99% 100% 97%   Weight:    73.1 kg (161 lb 3.2 oz)  Height:    5\' 9"  (1.753 m)  Constitutional: NAD, calm, comfortable Eyes: PERTLA, lids and conjunctivae normal ENMT: Mucous membranes are moist. Posterior pharynx clear of any exudate or lesions.   Neck: normal, supple, no masses, no thyromegaly Respiratory: Fine crackles in bilateral bases, no wheezing, no rhonchi. Normal respiratory effort. No accessory muscle use.  Cardiovascular: Rate ~120 and irregular. No significant extremity edema. JVP 12 cm. Abdomen: No distension, no tenderness, no masses palpated. Bowel sounds normal.  Musculoskeletal: no clubbing / cyanosis. No joint deformity upper and lower extremities. Normal muscle tone.  Skin: no significant rashes, lesions, ulcers. Warm, dry, well-perfused. Neurologic: CN 2-12 grossly intact. Sensation intact, DTR normal. Strength 5/5 in all 4 limbs.  Psychiatric: Normal judgment and insight. Alert and oriented x 3. Normal mood and affect.     Labs on Admission: I have personally reviewed following labs and imaging studies  CBC:  Recent Labs Lab 03/31/16 1619  WBC 6.5  HGB 10.1*  HCT 29.8*  MCV 91.4  PLT 123456   Basic Metabolic Panel:  Recent Labs Lab 03/31/16 1619  NA 135  K 4.0  CL  102  CO2 23  GLUCOSE 164*  BUN 18  CREATININE 1.20  CALCIUM 9.4   GFR: Estimated Creatinine Clearance: 53.2 mL/min (by C-G formula based on SCr of 1.2 mg/dL). Liver Function Tests: No results for input(s): AST, ALT, ALKPHOS, BILITOT, PROT, ALBUMIN in the last 168 hours. No results for input(s): LIPASE, AMYLASE in the last 168 hours. No results for input(s): AMMONIA in the last 168 hours. Coagulation Profile: No results for input(s): INR, PROTIME in the last 168 hours. Cardiac Enzymes:  Recent Labs Lab 03/31/16 1619  TROPONINI 0.03*   BNP (last 3 results) No results for input(s): PROBNP in the last 8760 hours. HbA1C: No results for input(s): HGBA1C in the last 72 hours. CBG:  Recent Labs Lab 03/31/16 2006  GLUCAP 124*   Lipid Profile: No results for input(s): CHOL, HDL, LDLCALC, TRIG, CHOLHDL, LDLDIRECT in the last 72 hours. Thyroid Function Tests: No results for input(s): TSH, T4TOTAL, FREET4, T3FREE, THYROIDAB in the last 72 hours. Anemia Panel: No results for input(s): VITAMINB12, FOLATE, FERRITIN, TIBC, IRON, RETICCTPCT in the last 72 hours. Urine analysis:    Component Value Date/Time   COLORURINE YELLOW 02/12/2016 0950   APPEARANCEUR HAZY (A) 02/12/2016 0950   LABSPEC 1.020 02/12/2016 0950   PHURINE 6.0 02/12/2016 0950   GLUCOSEU NEGATIVE 02/12/2016 0950   HGBUR SMALL (A) 02/12/2016 0950   BILIRUBINUR NEGATIVE 02/12/2016 0950   KETONESUR NEGATIVE 02/12/2016 0950   PROTEINUR 30 (A) 02/12/2016 0950   UROBILINOGEN 1.0 12/25/2014 1257   NITRITE NEGATIVE 02/12/2016 0950   LEUKOCYTESUR LARGE (A) 02/12/2016 0950   Sepsis Labs: @LABRCNTIP (procalcitonin:4,lacticidven:4) )No results found for this or any previous visit (from the past 240 hour(s)).   Radiological Exams on Admission: Dg Chest 2 View  Result Date: 03/31/2016 CLINICAL DATA:  Cough. EXAM: CHEST  2 VIEW COMPARISON:  07/26/2014 . FINDINGS: Mediastinum and hilar structures normal. Prior CABG.  Cardiomegaly with mild pulmonary vascular prominence and interstitial prominence consistent with mild CHF. Previously identified infiltrate left upper lobe has cleared. Mid thoracic vertebral body compression fracture. Prior vertebroplasty . IMPRESSION: 1. Prior CABG. Cardiomegaly with mild interstitial prominence consistent mild CHF. 2. Previously identified left upper lobe infiltrate noted on chest x-ray of 07/26/2014 has cleared. Changes of pleuroparenchymal scarring again noted. Electronically Signed   By: Marcello Moores  Register   On: 03/31/2016 16:17    EKG: Independently reviewed. Atrial fibrillation, non-specific T-wave abnormality  inferiorly  Assessment/Plan  1. Atrial fibrillation with RVR  - Pt saw his PCP for persistent cough, was noted to have HR 130s, and EKG revealed a fib; he was directed to ED  - No known hx of a fib or palpitations, but no palpitations today with HR in 130s  - He had mild enlargement of left atrium on TTE in May 2016  - Was on Coreg until stopping on his own 1 month ago d/t SPB in 100-range  - This is possibly precipitated by the acute URI  - Rate is well-controlled with IV diltiazem - Thyroid studies pending - Troponin is 0.03, but no angina; will trend troponin to exclude ischemic etiology  - CHADS-VASc is 16 (age x2, CHF, HTN, DM, CAD); will likely need AC, thyroid studies could reveal reversible etiology, will defer to cardiology consultant  - Cardiology is consulting and much appreciated; will follow-up recommendations   2. Acute on chronic combined systolic/diastolic CHF  - Pt presents with JVD, mild interstitial edema on CXR  - TTE (08/09/14) with 45%, basal & mid-inferolateral HK, grade 2 diastolic dysfunction, mild LAE, mild MS, mild MR, mild-mod TR, mild elevation in PA pressures  - He is managed with losartan at home; he stopped Coreg (12.5 mg) on his own a month ago d/t SBP in 100-range, could likely resume at lower dose  - Losartan held on admission given  small bump in SCr and plan for diuresis  - He was given Lasix 20 mg IV x1 on admission  - Plan to SLIV, follow daily wts and I/Os, fluid-restrict diet  - TTE is ordered   3. CAD s/p CABG  - No anginal complaints on admission   - Troponin borderline elevated at 0.03, will trend  - He is being monitored on telemetry and TTE is ordered  - Continue Zocor, ASA, Plavix; losartan held as above   4. Hypertension  - BP has remained at goal here  - He is on diltiazem infusion on admission  - He is managed with losartan at home, held on admission as above, and Coreg (stopped taking a month ago)  - Resume losartan and Coreg as appropriate    5. Type II DM  - A1c 6.7% in September 2016  - Managed with Actos and Amaryl at home; these will be held  - Check CBG with meals and qHS  - Start low-intensity SSI correctional and adjust prn    6. Normocytic anemia  - Hgb is 10.1 on admission  - This is stable and there is no sign of bleeding   7. Acute viral URI  - Pt appears comfortable on admission and is afebrile  - Complains of persistent cough, will treat symptomatically    DVT prophylaxis: sq Lovenox  Code Status: Full  Family Communication: Wife updated at bedside Disposition Plan: Observe on telemetry Consults called: Cardiology Admission status: Observation    Vianne Bulls, MD [facility] Pager [phone]  If 7PM-7AM, please contact night-coverage [email or web address] Password [email or web address]  03/31/2016, 8:08 PM

## 2016-03-31 NOTE — ED Notes (Signed)
Ambulated pt to bathroom, no issues. 

## 2016-03-31 NOTE — ED Triage Notes (Signed)
Per [facility]: Pt to ED from [facility] [address] c/o asymptomatic Atrial Fibrillation with rate between 90-130. Pt went to doctor for cough/cold symptoms he's been experiencing past few days when staff noticed his irregular rhythm. Pt has extensive cardiac hx and HTN and was on losartan, amlodipine, and carvedilol. Per EMS, pt reported he'd been taking his own BP at home and a month ago, noticed it in the 100s so he stopped taking the carvedilol without notifying MD. Soon after stopping it, he began noticing intermittent palpitations. Pt denies pain/SOB/N/V. He is A&O x 4. EMS VS: 132/63, RR 18, 97% RA, CBG 138.

## 2016-04-01 DIAGNOSIS — I5043 Acute on chronic combined systolic (congestive) and diastolic (congestive) heart failure: Secondary | ICD-10-CM | POA: Diagnosis not present

## 2016-04-01 DIAGNOSIS — I25810 Atherosclerosis of coronary artery bypass graft(s) without angina pectoris: Secondary | ICD-10-CM

## 2016-04-01 DIAGNOSIS — I10 Essential (primary) hypertension: Secondary | ICD-10-CM | POA: Diagnosis not present

## 2016-04-01 DIAGNOSIS — R011 Cardiac murmur, unspecified: Secondary | ICD-10-CM

## 2016-04-01 DIAGNOSIS — E785 Hyperlipidemia, unspecified: Secondary | ICD-10-CM

## 2016-04-01 DIAGNOSIS — I4891 Unspecified atrial fibrillation: Secondary | ICD-10-CM

## 2016-04-01 LAB — IRON AND TIBC
Iron: 24 ug/dL — ABNORMAL LOW (ref 45–182)
SATURATION RATIOS: 8 % — AB (ref 17.9–39.5)
TIBC: 305 ug/dL (ref 250–450)
UIBC: 281 ug/dL

## 2016-04-01 LAB — GLUCOSE, CAPILLARY
GLUCOSE-CAPILLARY: 136 mg/dL — AB (ref 65–99)
GLUCOSE-CAPILLARY: 131 mg/dL — AB (ref 65–99)
Glucose-Capillary: 127 mg/dL — ABNORMAL HIGH (ref 65–99)
Glucose-Capillary: 122 mg/dL — ABNORMAL HIGH (ref 65–99)

## 2016-04-01 LAB — BASIC METABOLIC PANEL
ANION GAP: 12 (ref 5–15)
BUN: 17 mg/dL (ref 6–20)
CHLORIDE: 105 mmol/L (ref 101–111)
CO2: 21 mmol/L — ABNORMAL LOW (ref 22–32)
Calcium: 9.2 mg/dL (ref 8.9–10.3)
Creatinine, Ser: 1.16 mg/dL (ref 0.61–1.24)
GFR calc Af Amer: >60 mL/min (ref >60)
GFR, EST NON AFRICAN AMERICAN: 60 mL/min — AB (ref >60)
GLUCOSE: 140 mg/dL — AB (ref 65–99)
POTASSIUM: 3.8 mmol/L (ref 3.5–5.1)
Sodium: 138 mmol/L (ref 135–145)

## 2016-04-01 LAB — TROPONIN I
TROPONIN I: 0.05 ng/mL — AB (ref <0.03)
Troponin I: 0.04 ng/mL (ref <0.03)

## 2016-04-01 MED ORDER — INSULIN ASPART 100 UNIT/ML ~~LOC~~ SOLN
0.0000 [IU] | Freq: Three times a day (TID) | SUBCUTANEOUS | Status: DC
  Administered 2016-04-01 – 2016-04-02 (×5): 1 [IU] via SUBCUTANEOUS
  Administered 2016-04-03: 2 [IU] via SUBCUTANEOUS

## 2016-04-01 MED ORDER — CARVEDILOL 12.5 MG PO TABS
12.5000 mg | ORAL_TABLET | Freq: Two times a day (BID) | ORAL | Status: DC
  Administered 2016-04-01 – 2016-04-03 (×4): 12.5 mg via ORAL
  Filled 2016-04-01 (×4): qty 1

## 2016-04-01 MED ORDER — CARVEDILOL 12.5 MG PO TABS: 12.5000 mg | ORAL_TABLET | Freq: Two times a day (BID) | ORAL | Status: DC

## 2016-04-01 MED ORDER — INSULIN ASPART 100 UNIT/ML ~~LOC~~ SOLN
0.0000 [IU] | Freq: Every day | SUBCUTANEOUS | Status: DC
Start: 2016-04-01 — End: 2016-04-03
  Administered 2016-04-02: 2 [IU] via SUBCUTANEOUS

## 2016-04-01 NOTE — Consult Note (Signed)
CARDIOLOGY CONSULT NOTE   Patient ID: [name] MRN: [id number] DOB/AGE: 1940-07-16 76 y.o.  Admit date: 03/31/2016  Requesting Physician: Dr. Tyrell Antonio Primary Physician:   Melinda Crutch, MD Primary Cardiologist: Dr. Martinique  Reason for Consultation:  New onset afib with RVR  HPI: [name] is a 76 y.o. 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   He has a history of coronary disease and is status post redo coronary bypass surgery in 2005 after emergent stenting of the left main. This included a free RIMA graft to the OM, SVG to diagonal, and SVG to PDA. LIMA to LAD was intact from original surgery.  He was last seen by Dr. Martinique in 08/2015 for follow-up and doing well at that time. He was in his usual state of health until last week when he started to notice cough and cold type symptoms. He continued to have cough and saw his primary care doctor yesterday. His primary care PA-C noticed an elevated heart rate and EKG was obtained. This showed new onset atrial fibrillation with RVR and he was referred to the emergency department for further workup and treatment.  In the ER he continued to have A. fib with RVR. Chest x-ray showed cardiomegaly and mild CHF. BNP 225, troponin 0.03, and creatinine 1.2, elevated from baseline of 1. He was treated with an IV push of diltiazem and started on an infusion with improvement of his heart rates.  He is now off diltiazem gtt since early this AM. He was previously treated with Coreg 12.5mg  BID as an outpatient but patient had self discontinued this due to SBPs in 100s. This was restarted today with first dose to be given tonight. He is feeling well. Still has a cough, but codeine syrup  did help this. No chest pain or SOB. Has chronic mild LE edema which is unchanged. No orthopnea or PND. No dizziness or syncope. No palpitations recently but about a month ago he was awoken from sleep with the feeling like his heart was racing, but he never had this evaluated.    Past Medical History:  Diagnosis Date  . Acute inferior myocardial infarction (HCC)    hx  . Acute on chronic diastolic CHF (congestive heart failure), NYHA class 1 ([place]) 08/09/2014  . Anemia   . BPH (benign prostatic hyperplasia)   . CAD (coronary artery disease)   . Dyslipidemia   . History of blood transfusion    "I've had one; don't remember when"  . HTN (hypertension)   . Hyponatremia   . 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 Myocardial infarction x2  1986, 2005  . 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 PAD (peripheral artery disease) ([place])   . Type II diabetes mellitus ([place])      Past Surgical History:  Procedure Laterality Date  . CARDIAC CATHETERIZATION  12/23/2014   Procedure: Left Heart Cath and Cors/Grafts Angiography;  Surgeon: Peter M Martinique, MD;  Location: [place] [facility];  Service: Cardiovascular;;  . CATARACT EXTRACTION W/ INTRAOCULAR LENS  IMPLANT, BILATERAL Bilateral   . CORONARY ANGIOPLASTY WITH STENT PLACEMENT    . CORONARY ARTERY BYPASS GRAFT  1986; redo 2005   ; free Rima to OM, svg-diag,svg-pda  . ENDARTERECTOMY FEMORAL Bilateral 12/31/2014   Procedure: BILATERAL FEMORAL ENDARTERECTOMY;  Surgeon: Elam Dutch, MD;  Location: [place];  Service: Vascular;  Laterality: Bilateral;  . FEMORAL-FEMORAL BYPASS GRAFT Bilateral 12/31/2014   Procedure: FEMORAL-FEMORAL ARTERY BYPASS GRAFT;  Surgeon: Elam Dutch, MD;  Location: [place];  Service: Vascular;  Laterality: Bilateral;  . KYPHOPLASTY  02/29/2012   Procedure: KYPHOPLASTY;  Surgeon: Sinclair Ship, MD;  Location: [place];  Service: Orthopedics;  Laterality: Bilateral;  T 10 kyphoplasty  . LAPAROTOMY N/A  08/02/2014   Procedure: EXPLORATORY LAPAROTOMY LYSIS OF ADHESIONS;  Surgeon: Donnie Mesa, MD;  Location: [place];  Service: General;  Laterality: N/A;  . MELANOMA EXCISION     scalp  . PERIPHERAL VASCULAR CATHETERIZATION N/A 12/05/2014   Procedure: Abdominal Aortogram;  Surgeon: Elam Dutch, MD;  Location: [place] [facility];  Service: Cardiovascular;  Laterality: N/A;  . ROTATOR CUFF REPAIR Right   . TONSILLECTOMY      Allergies  Allergen Reactions  . Hctz [Hydrochlorothiazide] Other (See Comments)    Hypokalemia     I have reviewed the patient's current medications . aspirin EC  81 mg Oral Daily  . clopidogrel  75 mg Oral Daily  . enoxaparin (LOVENOX) injection  40 mg Subcutaneous Q24H  . insulin aspart  0-5 Units Subcutaneous QHS  . insulin aspart  0-9 Units Subcutaneous TID WC  . iron polysaccharides  150 mg Oral QHS  . simvastatin  20 mg Oral QHS    acetaminophen, ALPRAZolam, guaiFENesin-codeine, guaiFENesin-dextromethorphan, HYDROcodone-acetaminophen, ondansetron (ZOFRAN) IV, zolpidem  Prior to Admission medications   Medication Sig Start Date End Date Taking? Authorizing Provider  ALPRAZolam Duanne Moron) 0.5 MG tablet Take 0.5 mg by mouth 3 (three) times daily as needed for anxiety.    Yes Historical Provider, MD  amLODipine (NORVASC) 5 MG tablet Take 1 tablet (5 mg total) by mouth daily. 12/17/14  Yes Peter M Martinique, MD  aspirin 81 MG tablet Take 81 mg by mouth daily.     Yes Historical Provider, MD  clopidogrel (PLAVIX) 75 MG tablet Take 1 tablet (75 mg total) by mouth daily. 06/11/13  Yes Peter M Martinique, MD  glimepiride (AMARYL) 4 MG tablet Take 4 mg by mouth daily at 2 PM.    Yes Historical Provider, MD  guaifenesin (ROBITUSSIN) 100 MG/5ML syrup Take 200 mg by mouth 3 (three) times daily as needed for cough.   Yes Historical Provider, MD  iron polysaccharides (NIFEREX) 150 MG capsule Take 150 mg by mouth at bedtime.    Yes Historical Provider, MD  losartan (COZAAR) 100 MG  tablet Take 100 mg by mouth daily.  11/05/12  Yes Historical Provider, MD  nitroGLYCERIN (NITROSTAT) 0.4 MG SL tablet PLACE 1 TABLET (0.4 MG TOTAL) UNDER THE TONGUE EVERY 5 (FIVE) MINUTES AS NEEDED FOR CHEST PAIN. 01/04/16  Yes Peter M Martinique, MD  pioglitazone (ACTOS) 45 MG tablet Take 45 mg by mouth daily.  04/13/12  Yes Historical Provider, MD  simvastatin (ZOCOR) 20 MG tablet Take 1 tablet (20 mg total) by mouth at bedtime. 10/21/15  Yes Peter M Martinique, MD  Throat Lozenges (COUGH DROPS MT) Use as directed 1 lozenge in the mouth or throat daily as needed (cough).   Yes Historical Provider, MD  carvedilol (COREG) 12.5 MG tablet TAKE 1 TABLET TWICE A DAY 02/22/11   Peter M Martinique, MD     Social History  Social History  . Marital status: Married    Spouse name: N/A  . Number of children: 2  . Years of education: N/A   Occupational History  . insurance Retired   Social History Main Topics  . Smoking status: Former Smoker    Packs/day: 2.00    Years: 25.00    Types: Cigarettes    Quit date: 02/22/1986  . Smokeless tobacco: Never Used  . Alcohol use 3.6 oz/week    6 Cans of beer per week  . Drug use: No  . Sexual activity: Not Currently   Other Topics Concern  . Not on file   Social History Narrative  . No narrative on file    Family Status  Relation Status  . Mother Deceased at age 91  . Father Deceased at age 44   alzheimers  . Sister Alive  . Brother Deceased at age 41   cirrhosis  . Brother Alive  . Maternal Grandmother Deceased  . Maternal Grandfather Deceased  . Paternal Grandmother Deceased  . Paternal Grandfather Deceased   Family History  Problem Relation Age of Onset  . Dementia Mother     ROS:  Full 14 point review of systems complete and found to be negative unless listed above.  Physical Exam: Blood pressure (!) 132/48, pulse 68, temperature 98.6 F (37 C), temperature source Oral, resp. rate 16, height 5\' 9"  (1.753 m), weight 160 lb 6.4 oz (72.8 kg),  SpO2 95 %.  General: Well developed, well nourished, male in no acute distress Head: Eyes PERRLA, No xanthomas.   Normocephalic and atraumatic, oropharynx without edema or exudate.  Lungs: CTAB Heart: irreg irreg S1 S2, no rub/gallop, Heart irregular rate and rhythm with S1, S2 no murmur. pulses are 2+ extrem.   Neck: No carotid bruits. No lymphadenopathy.  No JVD. Abdomen: Bowel sounds present, abdomen soft and non-tender without masses or hernias noted. Msk:  No spine or cva tenderness. No weakness, no joint deformities or effusions. Extremities: No clubbing or cyanosis. 1+ bilateral LE edema.  Neuro: Alert and oriented X 3. No focal deficits noted. Psych:  Good affect, responds appropriately Skin: No rashes or lesions noted.  Labs:   Lab Results  Component Value Date   WBC 6.5 03/31/2016   HGB 10.1 (L) 03/31/2016   HCT 29.8 (L) 03/31/2016   MCV 91.4 03/31/2016   PLT 201 03/31/2016   No results for input(s): INR in the last 72 hours.  Recent Labs Lab 04/01/16 0352  NA 138  K 3.8  CL 105  CO2 21*  BUN 17  CREATININE 1.16  CALCIUM 9.2  GLUCOSE 140*   Magnesium  Date Value Ref Range Status  08/10/2014 1.7 1.7 - 2.4 mg/dL Final    Recent Labs  03/31/16 1619 03/31/16 2141 04/01/16 0352 04/01/16 1104  TROPONINI 0.03* 0.05* 0.05* 0.04*   No results for input(s): TROPIPOC in the last 72 hours. No results found for: PROBNP Lab Results  Component Value Date   TRIG 44 08/07/2014   No results found for: DDIMER Lipase  Date/Time Value Ref Range Status  07/25/2014 06:28 PM 31 11 - 59 U/L Final   TSH  Date/Time Value Ref Range Status  03/31/2016 09:41 PM 1.009 0.350 - 4.500 uIU/mL Final    Comment:    Performed by a 3rd Generation assay with a functional sensitivity of <=0.01 uIU/mL.   Vitamin B-12  Date/Time Value Ref Range Status  07/27/2014 05:50 AM 1,149 (H) 180 - 914 pg/mL  Final    Comment:    (NOTE) This assay is not validated for testing neonatal  or myeloproliferative syndrome specimens for Vitamin B12 levels.    Folate  Date/Time Value Ref Range Status  07/27/2014 05:50 AM 14.2 >5.9 ng/mL Final   Ferritin  Date/Time Value Ref Range Status  07/27/2014 05:50 AM 226 24 - 336 ng/mL Final   TIBC  Date/Time Value Ref Range Status  07/27/2014 05:50 AM 302 250 - 450 ug/dL Final   Iron  Date/Time Value Ref Range Status  07/27/2014 05:50 AM 39 (L) 45 - 182 ug/dL Final   Retic Ct Pct  Date/Time Value Ref Range Status  07/27/2014 05:50 AM 2.5 0.4 - 3.1 % Final    Echo: TTE (08/09/14) with 45%, basal & mid-inferolateral HK, grade 2 diastolic dysfunction, mild LAE, mild MS, mild MR, mild-mod TR, mild elevation in PA pressures   ECG: afib with CVR  Radiology:  Dg Chest 2 View  Result Date: 03/31/2016 CLINICAL DATA:  Cough. EXAM: CHEST  2 VIEW COMPARISON:  07/26/2014 . FINDINGS: Mediastinum and hilar structures normal. Prior CABG. Cardiomegaly with mild pulmonary vascular prominence and interstitial prominence consistent with mild CHF. Previously identified infiltrate left upper lobe has cleared. Mid thoracic vertebral body compression fracture. Prior vertebroplasty . IMPRESSION: 1. Prior CABG. Cardiomegaly with mild interstitial prominence consistent mild CHF. 2. Previously identified left upper lobe infiltrate noted on chest x-ray of 07/26/2014 has cleared. Changes of pleuroparenchymal scarring again noted. Electronically Signed   By: [place]   On: 03/31/2016 16:17    ASSESSMENT AND PLAN:    Principal Problem:   Atrial fibrillation with rapid ventricular response (HCC) Active Problems:   CAD (coronary artery disease)   Acute on chronic combined systolic and diastolic CHF (congestive heart failure) ([place])   Essential (primary) hypertension   Type 2 diabetes mellitus with diabetic polyneuropathy (HCC)   Atrial fibrillation with RVR ([place])  [name] is a 76 y.o. 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  New-onset atrial fibrillation with RVR: TSH normal but free T4 mildly elevated at 1.38. He was previously treated with Coreg 12.5mg  BID but the patient self discontinued this secondary to low blood pressures. Was placed on diltiazem gtt in the ER with good rate control. Dilt gtt stopped early this AM and HR sustaining in 70s on no AVN blocking agents. Restarted on home Coreg 12.5mg  BID, to start tonight. CHADSVASC is at least 6 (CHF, HTN, age, DM, vasc dz). He will likely need to be started on oral anticoagulation with a DOAC.  Acute on chronic combined S/D CHF: EF 45% in 07/2014. Patient had some mild CHF noted on chest x-ray and BNP mildly elevated. His home Losartan was discontinued given mild AKI and plan for diuresis. He was given IV lasix 20mg . He does not take any diuretics at home. 2D ECHO pending.   CAD s/p CABG and redo CABG: Mildly elevated troponin with low and flat trend consistent with demand ischemia in the setting of A. fib with RVR and acute CHF. Currently on aspirin and Plavix. We will likely need to discontinue one or both of these with a need for long-term oral anticoagulation for atrial fibrillation  PAD: Followed by Dr.  Fields.  HTN: Blood pressure well controlled currently  DMT2: Hemoglobin A1c 6.7 in 11/2014.   Signed: Angelena Form, PA-C 04/01/2016 1:48 PM  Pager [phone]  Co-Sign MD  Patient seen and examined. Agree with assessment and plan. [name] is a 76 year old Caucasian male who is followed by Dr. Peter Martinique for his cardiac issues.  The patient has a history of significant CAD and PVD as well as hypertension, and type 2 diabetes mellitus.  He underwent initial CABG revascularization surgery in 1987 and a redo in 2005.  PVD  and is status post bilateral femoral endarterectomy and femoropopliteal them bypass graft surgery in 2016.  Possibly one month ago, the patient believes that he noticed his heart rate was regular but went to sleep and it spontaneously resolved.  Yesterday he was seen by his primary physician and was found to be in atrial fibrillation referred to the hospital for further evaluation and treatment.  He admits to a cough that has been nonproductive over the past 6 days.  Of note, over the past month.  He has self discontinued his carvedilol since he felt his blood pressure was getting too low.  His additional medication.  He denies any recent anginal type symptomatology.  He tells me that he has been on supplemental iron therapy for the past 6 months but has not had follow-up laboratory.  He did denies any overt GI bleeding.  He has been on dual antiplatelet therapy with aspirin and Plavix.  Since admission.  He initially was treated with IV diltiazem but this was discontinued when he became bradycardic.  Presently, he is in atrial fibrillation with a ventricular rate in the 90s.  He has been restarted on carvedilol 12.5 mg twice a day.  HEENT is normal.  JVD is increased at 8 cm.  Lungs without wheezing.  His rhythm was irregularly irregular.  There was a 1 to 2/6 systolic murmur in the aortic region and upper left sternal border.  There was no S3 gallop.  Abdomen was soft and nontender.  Homans sign was negative.  There was trace to 1+ lower extremity edema. Neurologic exam is grossly nonfocal.  His ECG shows atrial fibrillation with a controlled ventricular rate at 74 with mild ST-T changes in the lateral leads.  With his elevated cha2ds2vascscore he is a candidate for systemic anticoagulation as long as there is not significant GI contraindication.  I have recommended initiation of eliquis 5 mg twice a day, and with his CAD.   I recommend that he continue baby aspirin 81 mg.  I will discontinue his Plavix.  He has  not had any recent stenting.  I am recommending a 2-D echo Doppler study to reevaluate both systolic and diastolic function, chamber dimensions, and systolic murmur.  I will also recheck iron studies in light of his treatment with Niferex.  Will order a fasting lipid panel to make certain he is at target less than 70 in this patient with significant CAD, and if not his simvastatin should be discontinued and switched to  high potency statin therapy with either atorvastatin or rosuvastatin.  We will follow the patient with you.   Troy Sine, MD, [facility] 04/01/2016 3:14 PM

## 2016-04-01 NOTE — Progress Notes (Signed)
PROGRESS NOTE    MOSTYN BUETER  [id number] DOB: [birth year] DOA: 03/31/2016 PCP: Melinda Crutch, MD    Brief Narrative: [name] is a 76 y.o. male with medical history significant for hypertension, type 2 diabetes mellitus, peripheral arterial disease, coronary artery disease status post CABG, and chronic combined systolic/diastolic CHF who presents to the emergency department at the direction of his primary care physician for evaluation of new onset atrial fibrillation with RVR. Patient reports that he had been in his usual state of health until approximately one week ago when he developed upper respiratory symptoms with sore throat, nasal congestion, and cough. Since that time, the congestion and sore throat have improved significantly, but he has a persisting cough. He was evaluated today and that his primary care clinic for the persistent cough and noted to have a pulse rate in the 130s. EKG was obtained, revealing new atrial fibrillation with RVR. Patient denies any chest pain or palpitations associated with this and denies any significant dyspnea. He describes his cough is nonproductive and denies any fevers, chills, or lightheadedness. He has been taking over-the-counter cough syrups and lozenges without any relief.    Assessment & Plan:   Principal Problem:   Atrial fibrillation with rapid ventricular response (HCC) Active Problems:   CAD (coronary artery disease)   Acute on chronic combined systolic and diastolic CHF (congestive heart failure) (HCC)   Essential (primary) hypertension   Type 2 diabetes mellitus with diabetic polyneuropathy (HCC)   Atrial fibrillation with RVR (HCC)  1-A fib RVR;  Was on Cardizem Gtt , discontinue due to bradycardia.  New onset A fib. Cardiology consulted. Patient on aspirin and plavix, defer anticoagulation to cardiology.  resume carvedilol. Will discontinue Norvasc.   2. Acute on chronic combined systolic/diastolic CHF  - Pt presents with JVD,  mild interstitial edema on CXR  - TTE (08/09/14) with 45%, basal & mid-inferolateral HK, grade 2 diastolic dysfunction,  - Losartan held on admission given small bump in SCr and plan for diuresis  -defer further lasix doses to cardio.   3. Normal TSH, mildly elevated T 4 at 1.38 normal 1.1. Will check free T 3.    4-CAD s/p CABG  - No anginal complaints on admission   -mild elevation of troponin.  - Continue Zocor, ASA, Plavix; losartan held as above   5- Hypertension  - Resume losartan as appropriate, if BP allows it.   6. Type II DM  - SSI, hold oral medications while inpatient. SSI  7. Normocytic anemia  - Hgb is 10.1 on admission  Stable.   8. Acute viral URI  -symptomatic treatment.      DVT prophylaxis: Lovenox.  Code Status: Full Code.  Family Communication: care discussed with wife  Disposition Plan: To be determine.    Consultants:   Cardiology    Procedures:   none   Antimicrobials:  none   Subjective: Feeling better. Still coughing.   Objective: Vitals:   04/01/16 0507 04/01/16 0807 04/01/16 0942 04/01/16 1113  BP: (!) 115/38  117/81 (!) 132/48  Pulse:   78 68  Resp:    16  Temp:    98.6 F (37 C)  TempSrc:    Oral  SpO2:    95%  Weight:  72.8 kg (160 lb 6.4 oz)    Height:        Intake/Output Summary (Last 24 hours) at 04/01/16 1437 Last data filed at 04/01/16 0720  Gross per 24 hour  Intake  267 ml  Output              859 ml  Net             -592 ml   Filed Weights   03/31/16 2006 04/01/16 0807  Weight: 73.1 kg (161 lb 3.2 oz) 72.8 kg (160 lb 6.4 oz)    Examination:  General exam: Appears calm and comfortable  Respiratory system: Clear to auscultation. Respiratory effort normal. Cardiovascular system: S1 & S2 heard, RRR. No JVD, murmurs, rubs, gallops or clicks. No pedal edema. Gastrointestinal system: Abdomen is nondistended, soft and nontender. No organomegaly or masses felt. Normal bowel sounds  heard. Central nervous system: Alert and oriented. No focal neurological deficits. Extremities: Symmetric 5 x 5 power. Skin: No rashes, lesions or ulcers Psychiatry: Judgement and insight appear normal. Mood & affect appropriate.     Data Reviewed: I have personally reviewed following labs and imaging studies  CBC:  Recent Labs Lab 03/31/16 1619  WBC 6.5  HGB 10.1*  HCT 29.8*  MCV 91.4  PLT 123456   Basic Metabolic Panel:  Recent Labs Lab 03/31/16 1619 04/01/16 0352  NA 135 138  K 4.0 3.8  CL 102 105  CO2 23 21*  GLUCOSE 164* 140*  BUN 18 17  CREATININE 1.20 1.16  CALCIUM 9.4 9.2   GFR: Estimated Creatinine Clearance: 55 mL/min (by C-G formula based on SCr of 1.16 mg/dL). Liver Function Tests: No results for input(s): AST, ALT, ALKPHOS, BILITOT, PROT, ALBUMIN in the last 168 hours. No results for input(s): LIPASE, AMYLASE in the last 168 hours. No results for input(s): AMMONIA in the last 168 hours. Coagulation Profile: No results for input(s): INR, PROTIME in the last 168 hours. Cardiac Enzymes:  Recent Labs Lab 03/31/16 1619 03/31/16 2141 04/01/16 0352 04/01/16 1104  TROPONINI 0.03* 0.05* 0.05* 0.04*   BNP (last 3 results) No results for input(s): PROBNP in the last 8760 hours. HbA1C: No results for input(s): HGBA1C in the last 72 hours. CBG:  Recent Labs Lab 03/31/16 2006 04/01/16 0635 04/01/16 1112  GLUCAP 124* 127* 136*   Lipid Profile: No results for input(s): CHOL, HDL, LDLCALC, TRIG, CHOLHDL, LDLDIRECT in the last 72 hours. Thyroid Function Tests:  Recent Labs  03/31/16 2141  TSH 1.009  FREET4 1.38*   Anemia Panel: No results for input(s): VITAMINB12, FOLATE, FERRITIN, TIBC, IRON, RETICCTPCT in the last 72 hours. Sepsis Labs: No results for input(s): PROCALCITON, LATICACIDVEN in the last 168 hours.  No results found for this or any previous visit (from the past 240 hour(s)).       Radiology Studies: Dg Chest 2  View  Result Date: 03/31/2016 CLINICAL DATA:  Cough. EXAM: CHEST  2 VIEW COMPARISON:  07/26/2014 . FINDINGS: Mediastinum and hilar structures normal. Prior CABG. Cardiomegaly with mild pulmonary vascular prominence and interstitial prominence consistent with mild CHF. Previously identified infiltrate left upper lobe has cleared. Mid thoracic vertebral body compression fracture. Prior vertebroplasty . IMPRESSION: 1. Prior CABG. Cardiomegaly with mild interstitial prominence consistent mild CHF. 2. Previously identified left upper lobe infiltrate noted on chest x-ray of 07/26/2014 has cleared. Changes of pleuroparenchymal scarring again noted. Electronically Signed   By: [place]   On: 03/31/2016 16:17        Scheduled Meds: . aspirin EC  81 mg Oral Daily  . clopidogrel  75 mg Oral Daily  . enoxaparin (LOVENOX) injection  40 mg Subcutaneous Q24H  . insulin aspart  0-5 Units Subcutaneous QHS  .  insulin aspart  0-9 Units Subcutaneous TID WC  . iron polysaccharides  150 mg Oral QHS  . simvastatin  20 mg Oral QHS   Continuous Infusions:   LOS: 0 days    Time spent: 35 minutes.     Elmarie Shiley, MD [facility] Pager [phone]  If 7PM-7AM, please contact night-coverage [email or web address] Password [email or web address] 04/01/2016, 2:37 PM

## 2016-04-02 ENCOUNTER — Observation Stay (HOSPITAL_BASED_OUTPATIENT_CLINIC_OR_DEPARTMENT_OTHER): Payer: Medicare Other

## 2016-04-02 DIAGNOSIS — I4891 Unspecified atrial fibrillation: Secondary | ICD-10-CM | POA: Diagnosis not present

## 2016-04-02 DIAGNOSIS — I5043 Acute on chronic combined systolic (congestive) and diastolic (congestive) heart failure: Secondary | ICD-10-CM | POA: Diagnosis not present

## 2016-04-02 DIAGNOSIS — I2510 Atherosclerotic heart disease of native coronary artery without angina pectoris: Secondary | ICD-10-CM | POA: Diagnosis not present

## 2016-04-02 DIAGNOSIS — I110 Hypertensive heart disease with heart failure: Secondary | ICD-10-CM | POA: Diagnosis not present

## 2016-04-02 LAB — ECHOCARDIOGRAM COMPLETE
AOVTI: 33.8 cm
AV Area VTI index: 1.11 cm2/m2
AV Area VTI: 2.02 cm2
AV Area mean vel: 2.1 cm2
AV Peak grad: 9 mmHg
AV VEL mean LVOT/AV: 0.74
AV area mean vel ind: 1.11 cm2/m2
AVA: 2.09 cm2
AVG: 5 mmHg
AVPKVEL: 152 cm/s
Ao pk vel: 0.71 m/s
Area-P 1/2: 3.06 cm2
CHL CUP AV PEAK INDEX: 1.07
CHL CUP AV VEL: 2.09
CHL CUP DOP CALC LVOT VTI: 24.9 cm
CHL CUP LVOT MV VTI INDEX: 0.87 cm2/m2
CHL CUP LVOT MV VTI: 1.63
CHL CUP MV M VEL: 71
CHL CUP RV SYS PRESS: 51 mmHg
CHL CUP STROKE VOLUME: 61 mL
DOP CAL AO MEAN VELOCITY: 100 cm/s
E decel time: 197 msec
FS: 21 % — AB (ref 28–44)
HEIGHTINCHES: 69 in
IV/PV OW: 1.01
LA diam end sys: 45 mm
LA diam index: 2.39 cm/m2
LA vol A4C: 77 ml
LA vol index: 40.2 mL/m2
LA vol: 75.6 mL
LASIZE: 45 mm
LV sys vol index: 19 mL/m2
LVDIAVOL: 97 mL (ref 62–150)
LVDIAVOLIN: 51 mL/m2
LVOT area: 2.84 cm2
LVOT diameter: 19 mm
LVOTPV: 108 cm/s
LVOTSV: 71 mL
LVOTVTI: 0.74 cm
LVSYSVOL: 35 mL (ref 21–61)
MV Dec: 197
MV Peak grad: 9 mmHg
MVANNULUSVTI: 43.4 cm
MVPKEVEL: 153 m/s
Mean grad: 3 mmHg
P 1/2 time: 72 ms
PW: 11 mm — AB (ref 0.6–1.1)
RV TAPSE: 16.1 mm
Reg peak vel: 300 cm/s
Simpson's disk: 63
TR max vel: 300 cm/s
Valve area index: 1.11
WEIGHTICAEL: 2555.2 [oz_av]

## 2016-04-02 LAB — BASIC METABOLIC PANEL
ANION GAP: 9 (ref 5–15)
BUN: 20 mg/dL (ref 6–20)
CALCIUM: 8.9 mg/dL (ref 8.9–10.3)
CHLORIDE: 103 mmol/L (ref 101–111)
CO2: 24 mmol/L (ref 22–32)
Creatinine, Ser: 1.09 mg/dL (ref 0.61–1.24)
GFR calc non Af Amer: >60 mL/min (ref >60)
GLUCOSE: 114 mg/dL — AB (ref 65–99)
POTASSIUM: 3.6 mmol/L (ref 3.5–5.1)
Sodium: 136 mmol/L (ref 135–145)

## 2016-04-02 LAB — GLUCOSE, CAPILLARY
GLUCOSE-CAPILLARY: 142 mg/dL — AB (ref 65–99)
Glucose-Capillary: 117 mg/dL — ABNORMAL HIGH (ref 65–99)
Glucose-Capillary: 121 mg/dL — ABNORMAL HIGH (ref 65–99)
Glucose-Capillary: 203 mg/dL — ABNORMAL HIGH (ref 65–99)

## 2016-04-02 LAB — T3, FREE: T3, Free: 2.6 pg/mL (ref 2.0–4.4)

## 2016-04-02 LAB — FERRITIN: Ferritin: 244 ng/mL (ref 24–336)

## 2016-04-02 LAB — CBC
HEMATOCRIT: 27.5 % — AB (ref 39.0–52.0)
HEMOGLOBIN: 9.2 g/dL — AB (ref 13.0–17.0)
MCH: 30.7 pg (ref 26.0–34.0)
MCHC: 33.5 g/dL (ref 30.0–36.0)
MCV: 91.7 fL (ref 78.0–100.0)
Platelets: 184 10*3/uL (ref 150–400)
RBC: 3 MIL/uL — AB (ref 4.22–5.81)
RDW: 14 % (ref 11.5–15.5)
WBC: 6.7 10*3/uL (ref 4.0–10.5)

## 2016-04-02 LAB — IRON AND TIBC
Iron: 36 ug/dL — ABNORMAL LOW (ref 45–182)
SATURATION RATIOS: 17 % — AB (ref 17.9–39.5)
TIBC: 214 ug/dL — ABNORMAL LOW (ref 250–450)
UIBC: 178 ug/dL

## 2016-04-02 LAB — HEMOGLOBIN A1C
Hgb A1c MFr Bld: 6.5 % — ABNORMAL HIGH (ref 4.8–5.6)
Mean Plasma Glucose: 140 mg/dL

## 2016-04-02 LAB — LIPID PANEL
CHOL/HDL RATIO: 2.1 ratio
Cholesterol: 105 mg/dL (ref 0–200)
HDL: 51 mg/dL (ref >40)
LDL Cholesterol: 43 mg/dL (ref 0–99)
Triglycerides: 54 mg/dL (ref <150)
VLDL: 11 mg/dL (ref 0–40)

## 2016-04-02 LAB — RETICULOCYTES
RBC.: 2.96 MIL/uL — AB (ref 4.22–5.81)
RETIC COUNT ABSOLUTE: 53.3 10*3/uL (ref 19.0–186.0)
RETIC CT PCT: 1.8 % (ref 0.4–3.1)

## 2016-04-02 LAB — TROPONIN I: Troponin I: 0.04 ng/mL (ref <0.03)

## 2016-04-02 LAB — VITAMIN B12: Vitamin B-12: 687 pg/mL (ref 180–914)

## 2016-04-02 LAB — FOLATE: FOLATE: 22.9 ng/mL (ref >5.9)

## 2016-04-02 MED ORDER — APIXABAN 5 MG PO TABS
5.0000 mg | ORAL_TABLET | Freq: Two times a day (BID) | ORAL | Status: DC
  Administered 2016-04-02 – 2016-04-03 (×3): 5 mg via ORAL
  Filled 2016-04-02 (×3): qty 1

## 2016-04-02 MED ORDER — DIGOXIN 125 MCG PO TABS
0.2500 mg | ORAL_TABLET | Freq: Once | ORAL | Status: AC
  Administered 2016-04-02: 0.25 mg via ORAL
  Filled 2016-04-02: qty 2

## 2016-04-02 MED ORDER — DIGOXIN 125 MCG PO TABS
0.1250 mg | ORAL_TABLET | Freq: Every day | ORAL | Status: DC
  Administered 2016-04-03: 0.125 mg via ORAL
  Filled 2016-04-02: qty 1

## 2016-04-02 NOTE — Progress Notes (Signed)
Patient Name: [name] Date of Encounter: 04/02/2016  Primary Cardiologist: Dr Martinique  Hospital Problem List     Principal Problem:   Atrial fibrillation with rapid ventricular response ([place]) Active Problems:   CAD (coronary artery disease)   Acute on chronic combined systolic and diastolic CHF (congestive heart failure) ([place])   Essential (primary) hypertension   Type 2 diabetes mellitus with diabetic polyneuropathy (HCC)   Atrial fibrillation with RVR ([place])   Coronary artery disease involving coronary bypass graft of native heart without angina pectoris   Hyperlipidemia LDL goal <70   Murmur, cardiac     Subjective   Denies CP or dyspnea  Inpatient Medications    Scheduled Meds: . aspirin EC  81 mg Oral Daily  . carvedilol  12.5 mg Oral BID  . clopidogrel  75 mg Oral Daily  . enoxaparin (LOVENOX) injection  40 mg Subcutaneous Q24H  . insulin aspart  0-5 Units Subcutaneous QHS  . insulin aspart  0-9 Units Subcutaneous TID WC  . iron polysaccharides  150 mg Oral QHS  . simvastatin  20 mg Oral QHS   Continuous Infusions:  PRN Meds: acetaminophen, ALPRAZolam, guaiFENesin-codeine, guaiFENesin-dextromethorphan, HYDROcodone-acetaminophen, ondansetron (ZOFRAN) IV, zolpidem   Vital Signs    Vitals:   04/01/16 2013 04/02/16 0015 04/02/16 0557 04/02/16 1159  BP: (!) 116/43 (!) 110/49 116/68 (!) 107/44  Pulse:  67 93 65  Resp: 18 18 16 18   Temp: 98.2 F (36.8 C) 98.7 F (37.1 C) 98.3 F (36.8 C) 98.3 F (36.8 C)  TempSrc: Oral Oral Oral Oral  SpO2: 99% 96% 95% 95%  Weight:   159 lb 11.2 oz (72.4 kg)   Height:        Intake/Output Summary (Last 24 hours) at 04/02/16 1218 Last data filed at 04/02/16 0900  Gross per 24 hour  Intake              730 ml  Output              555 ml  Net              175 ml   Filed Weights   03/31/16 2006 04/01/16 0807 04/02/16 0557  Weight: 161 lb 3.2 oz (73.1 kg) 160 lb 6.4 oz (72.8 kg) 159 lb 11.2 oz (72.4 kg)     Physical Exam    GEN: Well nourished, well developed, in no acute distress.  HEENT: Grossly normal.  Neck: Supple Cardiac: Irregular Respiratory:  CTA GI: Soft, nontender, nondistended. MS: no deformity or atrophy. Skin: warm and dry, no rash. Neuro:  Strength and sensation are intact. Psych: AAOx3.  Normal affect.  Labs    CBC  Recent Labs  03/31/16 1619 04/02/16 0748  WBC 6.5 6.7  HGB 10.1* 9.2*  HCT 29.8* 27.5*  MCV 91.4 91.7  PLT 201 Q000111Q   Basic Metabolic Panel  Recent Labs  04/01/16 0352 04/02/16 0748  NA 138 136  K 3.8 3.6  CL 105 103  CO2 21* 24  GLUCOSE 140* 114*  BUN 17 20  CREATININE 1.16 1.09  CALCIUM 9.2 8.9   Cardiac Enzymes  Recent Labs  04/01/16 0352 04/01/16 1104 04/02/16 0748  TROPONINI 0.05* 0.04* 0.04*   Hemoglobin A1C  Recent Labs  04/01/16 0352  HGBA1C 6.5*   Fasting Lipid Panel  Recent Labs  04/02/16 0523  CHOL 105  HDL 51  LDLCALC 43  TRIG 54  CHOLHDL 2.1   Thyroid Function Tests  Recent  Labs  03/31/16 2141 04/01/16 1104  TSH 1.009  --   T3FREE  --  2.6    Telemetry    Atrial fibrillation with elevated rate - Personally Reviewed  Radiology    Dg Chest 2 View  Result Date: 03/31/2016 CLINICAL DATA:  Cough. EXAM: CHEST  2 VIEW COMPARISON:  07/26/2014 . FINDINGS: Mediastinum and hilar structures normal. Prior CABG. Cardiomegaly with mild pulmonary vascular prominence and interstitial prominence consistent with mild CHF. Previously identified infiltrate left upper lobe has cleared. Mid thoracic vertebral body compression fracture. Prior vertebroplasty . IMPRESSION: 1. Prior CABG. Cardiomegaly with mild interstitial prominence consistent mild CHF. 2. Previously identified left upper lobe infiltrate noted on chest x-ray of 07/26/2014 has cleared. Changes of pleuroparenchymal scarring again noted. Electronically Signed   By: Marcello Moores  Register   On: 03/31/2016 16:17    Patient Profile     [name] is  a 76 y.o. male with a history of HTN, DMT2, PAD s/p bilateral femoral endarterectomy and fem- fem BPG, CAD s/p CABG (redo CABG in 2005 after emergent stenting of left main), chronic combined S/D CHF (EF 45% 07/2014) who presented to [facility] ED from PCP office for new onset atrial fibrillation with RVR.  Assessment & Plan    1 new-onset atrial fibrillation-TSH normal. Await echocardiogram. Heart rate is elevated. Blood pressure is borderline. Continue carvedilol. Add digoxin. CHADSvasc 6. DC ASA and plavix; add apixaban 5 mg Bid. If his heart rate can be controlled and he is asymptomatic then would discharge on these medications and planned cardioversion in 4 weeks. If there is difficulty controlling his heart rate we will plan TEE guided cardioversion next week.  2 acute on chronic combined systolic/diastolic congestive heart failure-much improved following Lasix.  3 coronary artery disease-given need for anticoagulation I will discontinue aspirin and Plavix. Continue statin.  4 minimally elevated troponin-not consistent with acute coronary syndrome.  5 normocytic anemia-further evaluation per primary care.  Signed, Kirk Ruths, MD  04/02/2016, 12:18 PM

## 2016-04-02 NOTE — Progress Notes (Signed)
*  PRELIMINARY RESULTS* Echocardiogram 2D Echocardiogram has been performed.  [name] 04/02/2016, 2:36 PM

## 2016-04-02 NOTE — Progress Notes (Signed)
PROGRESS NOTE    [name]  [id number] DOB: Aug 05, 1940 DOA: 03/31/2016 PCP: Melinda Crutch, MD    Brief Narrative: [name] is a 76 y.o. male with medical history significant for hypertension, type 2 diabetes mellitus, peripheral arterial disease, coronary artery disease status post CABG, and chronic combined systolic/diastolic CHF who presents to the emergency department at the direction of his primary care physician for evaluation of new onset atrial fibrillation with RVR. Patient reports that he had been in his usual state of health until approximately one week ago when he developed upper respiratory symptoms with sore throat, nasal congestion, and cough. Since that time, the congestion and sore throat have improved significantly, but he has a persisting cough. He was evaluated today and that his primary care clinic for the persistent cough and noted to have a pulse rate in the 130s. EKG was obtained, revealing new atrial fibrillation with RVR. Patient denies any chest pain or palpitations associated with this and denies any significant dyspnea. He describes his cough is nonproductive and denies any fevers, chills, or lightheadedness. He has been taking over-the-counter cough syrups and lozenges without any relief.    Assessment & Plan:   Principal Problem:   Atrial fibrillation with rapid ventricular response (HCC) Active Problems:   CAD (coronary artery disease)   Acute on chronic combined systolic and diastolic CHF (congestive heart failure) (HCC)   Essential (primary) hypertension   Type 2 diabetes mellitus with diabetic polyneuropathy (HCC)   Atrial fibrillation with RVR (HCC)   Coronary artery disease involving coronary bypass graft of native heart without angina pectoris   Hyperlipidemia LDL goal <70   Murmur, cardiac  1-A fib RVR; New onset A fib.  Was on Cardizem Gtt , discontinue due to bradycardia.  New onset A fib. Cardiology consulted. Patient on aspirin and plavix,  defer anticoagulation to cardiology.  Continue with carvedilol.  Norvasc discontinue.  eliquis started for stroke prevention.  Started on digoxin.   2. Acute on chronic combined systolic/diastolic CHF  - Pt presents with JVD, mild interstitial edema on CXR  - TTE (08/09/14) with 45%, basal & mid-inferolateral HK, grade 2 diastolic dysfunction,  - Losartan held on admission given small bump in SCr and plan for diuresis  -defer further lasix doses to cardio.   3. Normal TSH, mildly elevated T 4 at 1.38 normal 1.1. T 3 normal.  Needs repeat labs in 4 weeks.    4-CAD s/p CABG  - No anginal complaints on admission   -mild elevation of troponin.  - Continue Zocor, ASA, Plavix; losartan held as above   5- Hypertension  - Resume losartan as appropriate, if BP allows it.   6. Type II DM  - SSI, hold oral medications while inpatient. SSI  7. Normocytic anemia  - Hgb is 10.1 on admission  Stable.   8. Acute viral URI  -symptomatic treatment.      DVT prophylaxis: Lovenox.  Code Status: Full Code.  Family Communication: care discussed with patient  Disposition Plan: To be determine.    Consultants:   Cardiology    Procedures:   none   Antimicrobials:  none   Subjective: Doing fine. Denies worsening dyspnea.   Objective: Vitals:   04/01/16 2013 04/02/16 0015 04/02/16 0557 04/02/16 1159  BP: (!) 116/43 (!) 110/49 116/68 (!) 107/44  Pulse:  67 93 65  Resp: 18 18 16 18   Temp: 98.2 F (36.8 C) 98.7 F (37.1 C) 98.3 F (36.8 C) 98.3  F (36.8 C)  TempSrc: Oral Oral Oral Oral  SpO2: 99% 96% 95% 95%  Weight:   72.4 kg (159 lb 11.2 oz)   Height:        Intake/Output Summary (Last 24 hours) at 04/02/16 1525 Last data filed at 04/02/16 0900  Gross per 24 hour  Intake              730 ml  Output              555 ml  Net              175 ml   Filed Weights   03/31/16 2006 04/01/16 0807 04/02/16 0557  Weight: 73.1 kg (161 lb 3.2 oz) 72.8 kg (160 lb 6.4  oz) 72.4 kg (159 lb 11.2 oz)    Examination:  General exam: Appears calm and comfortable  Respiratory system: Clear to auscultation. Respiratory effort normal. Cardiovascular system: S1 & S2 heard, RRR. No JVD, murmurs, rubs, gallops or clicks. No pedal edema. Gastrointestinal system: Abdomen is nondistended, soft and nontender. No organomegaly or masses felt. Normal bowel sounds heard. Central nervous system: Alert and oriented. No focal neurological deficits. Extremities: Symmetric 5 x 5 power. Skin: No rashes, lesions or ulcers Psychiatry: Judgement and insight appear normal. Mood & affect appropriate.     Data Reviewed: I have personally reviewed following labs and imaging studies  CBC:  Recent Labs Lab 03/31/16 1619 04/02/16 0748  WBC 6.5 6.7  HGB 10.1* 9.2*  HCT 29.8* 27.5*  MCV 91.4 91.7  PLT 201 Q000111Q   Basic Metabolic Panel:  Recent Labs Lab 03/31/16 1619 04/01/16 0352 04/02/16 0748  NA 135 138 136  K 4.0 3.8 3.6  CL 102 105 103  CO2 23 21* 24  GLUCOSE 164* 140* 114*  BUN 18 17 20   CREATININE 1.20 1.16 1.09  CALCIUM 9.4 9.2 8.9   GFR: Estimated Creatinine Clearance: 58.6 mL/min (by C-G formula based on SCr of 1.09 mg/dL). Liver Function Tests: No results for input(s): AST, ALT, ALKPHOS, BILITOT, PROT, ALBUMIN in the last 168 hours. No results for input(s): LIPASE, AMYLASE in the last 168 hours. No results for input(s): AMMONIA in the last 168 hours. Coagulation Profile: No results for input(s): INR, PROTIME in the last 168 hours. Cardiac Enzymes:  Recent Labs Lab 03/31/16 1619 03/31/16 2141 04/01/16 0352 04/01/16 1104 04/02/16 0748  TROPONINI 0.03* 0.05* 0.05* 0.04* 0.04*   BNP (last 3 results) No results for input(s): PROBNP in the last 8760 hours. HbA1C:  Recent Labs  04/01/16 0352  HGBA1C 6.5*   CBG:  Recent Labs Lab 04/01/16 1112 04/01/16 1637 04/01/16 2052 04/02/16 0713 04/02/16 1034  GLUCAP 136* 122* 131* 117* 142*    Lipid Profile:  Recent Labs  04/02/16 0523  CHOL 105  HDL 51  LDLCALC 43  TRIG 54  CHOLHDL 2.1   Thyroid Function Tests:  Recent Labs  03/31/16 2141 04/01/16 1104  TSH 1.009  --   FREET4 1.38*  --   T3FREE  --  2.6   Anemia Panel:  Recent Labs  04/01/16 1545 04/02/16 0523  FERRITIN  --  244  TIBC 305  --   IRON 24*  --    Sepsis Labs: No results for input(s): PROCALCITON, LATICACIDVEN in the last 168 hours.  No results found for this or any previous visit (from the past 240 hour(s)).       Radiology Studies: Dg Chest 2 View  Result Date: 03/31/2016 CLINICAL  DATA:  Cough. EXAM: CHEST  2 VIEW COMPARISON:  07/26/2014 . FINDINGS: Mediastinum and hilar structures normal. Prior CABG. Cardiomegaly with mild pulmonary vascular prominence and interstitial prominence consistent with mild CHF. Previously identified infiltrate left upper lobe has cleared. Mid thoracic vertebral body compression fracture. Prior vertebroplasty . IMPRESSION: 1. Prior CABG. Cardiomegaly with mild interstitial prominence consistent mild CHF. 2. Previously identified left upper lobe infiltrate noted on chest x-ray of 07/26/2014 has cleared. Changes of pleuroparenchymal scarring again noted. Electronically Signed   By: Marcello Moores  Register   On: 03/31/2016 16:17        Scheduled Meds: . apixaban  5 mg Oral BID  . carvedilol  12.5 mg Oral BID  . [START ON 04/03/2016] digoxin  0.125 mg Oral Daily  . insulin aspart  0-5 Units Subcutaneous QHS  . insulin aspart  0-9 Units Subcutaneous TID WC  . iron polysaccharides  150 mg Oral QHS  . simvastatin  20 mg Oral QHS   Continuous Infusions:   LOS: 0 days    Time spent: 35 minutes.     Elmarie Shiley, MD [facility] Pager [phone]  If 7PM-7AM, please contact night-coverage [email or web address] Password [email or web address] 04/02/2016, 3:25 PM

## 2016-04-02 NOTE — Progress Notes (Signed)
Cardiology on call called for suspicious EKG this am indicating acute MI. Cardiology called back indicating not a true MI. Dr. Eulas Post was notified too.  Pt is asymptomatic. Will report to incoming RN to monitor pt closely.

## 2016-04-03 ENCOUNTER — Other Ambulatory Visit (HOSPITAL_COMMUNITY): Payer: Medicare Other

## 2016-04-03 DIAGNOSIS — I4891 Unspecified atrial fibrillation: Secondary | ICD-10-CM | POA: Diagnosis not present

## 2016-04-03 LAB — CBC
HEMATOCRIT: 27.1 % — AB (ref 39.0–52.0)
Hemoglobin: 9.2 g/dL — ABNORMAL LOW (ref 13.0–17.0)
MCH: 30.8 pg (ref 26.0–34.0)
MCHC: 33.9 g/dL (ref 30.0–36.0)
MCV: 90.6 fL (ref 78.0–100.0)
Platelets: 191 10*3/uL (ref 150–400)
RBC: 2.99 MIL/uL — ABNORMAL LOW (ref 4.22–5.81)
RDW: 13.8 % (ref 11.5–15.5)
WBC: 5.2 10*3/uL (ref 4.0–10.5)

## 2016-04-03 LAB — BASIC METABOLIC PANEL
Anion gap: 7 (ref 5–15)
BUN: 24 mg/dL — ABNORMAL HIGH (ref 6–20)
CALCIUM: 8.8 mg/dL — AB (ref 8.9–10.3)
CO2: 26 mmol/L (ref 22–32)
Chloride: 103 mmol/L (ref 101–111)
Creatinine, Ser: 1.15 mg/dL (ref 0.61–1.24)
GFR calc non Af Amer: >60 mL/min (ref >60)
GLUCOSE: 98 mg/dL (ref 65–99)
Potassium: 3.6 mmol/L (ref 3.5–5.1)
Sodium: 136 mmol/L (ref 135–145)

## 2016-04-03 LAB — GLUCOSE, CAPILLARY
Glucose-Capillary: 110 mg/dL — ABNORMAL HIGH (ref 65–99)
Glucose-Capillary: 159 mg/dL — ABNORMAL HIGH (ref 65–99)

## 2016-04-03 MED ORDER — DIGOXIN 125 MCG PO TABS: 0.1250 mg | ORAL_TABLET | Freq: Every day | ORAL | 0 refills | Status: DC

## 2016-04-03 MED ORDER — AMOXICILLIN-POT CLAVULANATE 875-125 MG PO TABS: 1.0000 | ORAL_TABLET | Freq: Two times a day (BID) | ORAL | 0 refills | Status: DC

## 2016-04-03 MED ORDER — APIXABAN 5 MG PO TABS
5.0000 mg | ORAL_TABLET | Freq: Two times a day (BID) | ORAL | 0 refills | Status: DC
Start: 2016-04-03 — End: 2020-09-02

## 2016-04-03 MED ORDER — AMOXICILLIN-POT CLAVULANATE 875-125 MG PO TABS
1.0000 | ORAL_TABLET | Freq: Two times a day (BID) | ORAL | Status: DC
  Administered 2016-04-03: 1 via ORAL
  Filled 2016-04-03: qty 1

## 2016-04-03 NOTE — Progress Notes (Signed)
I have sent a message to our office's scheduler requesting a follow-up appointment, and our office will call the patient with this information.  Keisuke Hollabaugh PA-C  

## 2016-04-03 NOTE — Discharge Summary (Signed)
Physician Discharge Summary  [name] [id number] DOB: 08-13-1940 DOA: 03/31/2016  PCP: Melinda Crutch, MD  Admit date: 03/31/2016 Discharge date: 04/03/2016  Admitted From: Home  Disposition: Home   Recommendations for Outpatient Follow-up:  1. Follow up with PCP in 1-2 weeks 2. Please obtain BMP/CBC in one week 3. Follow up with cardiology for further care of A fib and HF.  4. Needs repeat TSH , Free t4 and T 3. labs in 4 weeks.  5. Needs further evaluation of anemia by PCP     Discharge Condition: Stable.  CODE STATUS; full code.  Diet recommendation: Heart Healthy   Brief/Interim Summary:  Kyle Holsapple McCuenis a 76 y.o.malewith medical history significant forhypertension, type 2 diabetes mellitus, peripheral arterial disease, coronary artery disease status post CABG, and chronic combined systolic/diastolic CHF who presents to the emergency department at the direction of his primary care physician for evaluation of new onset atrial fibrillation with RVR. Patient reports that he had been in his usual state of health until approximately one week ago when he developed upper respiratory symptoms with sore throat, nasal congestion, and cough. Since that time, the congestion and sore throat have improved significantly, but he has a persisting cough. He was evaluated today and that his primary care clinic for the persistent cough and noted to have a pulse rate in the 130s. EKG was obtained, revealing new atrial fibrillation with RVR. Patient denies any chest pain or palpitations associated with this and denies any significant dyspnea. He describes his cough is nonproductive and denies any fevers, chills, or lightheadedness. He has been taking over-the-counter cough syrups and lozenges without any relief.  Assessment & Plan: 1-A fib RVR; New onset A fib.  Was on Cardizem Gtt , discontinue due to bradycardia.  Continue with carvedilol.  Norvasc discontinue.  eliquis started for stroke  prevention.  Started on digoxin.  stable to be discharge, plan for cardioversion in 4 weeks.   2. Acute on chronic combined systolic/diastolic CHF  - Pt presents with JVD, mild interstitial edema on CXR  - TTE (08/09/14) with 45%, basal &mid-inferolateral HK, grade 2 diastolic dysfunction,  - Losartan held on admission given small bump in SCr and plan for diuresis  -Defer further lasix doses to cardio.   3. Normal TSH, mildly elevated T 4 at 1.38 normal 1.1. T 3 normal.  Needs repeat labs in 4 weeks.    4-CAD s/p CABG  - No anginal complaints on admission  -mild elevation of troponin.  - Continue Zocor, ASA, Plavix; losartan held as above   5- Hypertension  - Resume losartan as appropriate, if BP allows it.   6. Type II DM  - resume oral care.   7. Normocytic anemia  - Hgb is 10.1 on admission  -Stable.   8.  URI  -symptomatic treatment.  -still coughing, report yellow sputum. Will try augmenting for 5 days.    Discharge Diagnoses:  Principal Problem:   Atrial fibrillation with rapid ventricular response (HCC) Active Problems:   CAD (coronary artery disease)   Acute on chronic combined systolic and diastolic CHF (congestive heart failure) (HCC)   Essential (primary) hypertension   Type 2 diabetes mellitus with diabetic polyneuropathy (HCC)   Atrial fibrillation with RVR (HCC)   Coronary artery disease involving coronary bypass graft of native heart without angina pectoris   Hyperlipidemia LDL goal <70   Murmur, cardiac    Discharge Instructions  Discharge Instructions    Diet - low sodium heart  healthy    Complete by:  As directed    Increase activity slowly    Complete by:  As directed      Allergies as of 04/03/2016      Reactions   Hctz [hydrochlorothiazide] Other (See Comments)   Hypokalemia      Medication List    STOP taking these medications   amLODipine 5 MG tablet Commonly known as:  NORVASC   clopidogrel 75 MG tablet Commonly  known as:  PLAVIX   losartan 100 MG tablet Commonly known as:  COZAAR     TAKE these medications   ALPRAZolam 0.5 MG tablet Commonly known as:  XANAX Take 0.5 mg by mouth 3 (three) times daily as needed for anxiety.   amoxicillin-clavulanate 875-125 MG tablet Commonly known as:  AUGMENTIN Take 1 tablet by mouth every 12 (twelve) hours.   apixaban 5 MG Tabs tablet Commonly known as:  ELIQUIS Take 1 tablet (5 mg total) by mouth 2 (two) times daily.   aspirin 81 MG tablet Take 81 mg by mouth daily.   carvedilol 12.5 MG tablet Commonly known as:  COREG TAKE 1 TABLET TWICE A DAY   COUGH DROPS MT Use as directed 1 lozenge in the mouth or throat daily as needed (cough).   digoxin 0.125 MG tablet Commonly known as:  LANOXIN Take 1 tablet (0.125 mg total) by mouth daily. Start taking on:  04/04/2016   glimepiride 4 MG tablet Commonly known as:  AMARYL Take 4 mg by mouth daily at 2 PM.   guaifenesin 100 MG/5ML syrup Commonly known as:  ROBITUSSIN Take 200 mg by mouth 3 (three) times daily as needed for cough.   iron polysaccharides 150 MG capsule Commonly known as:  NIFEREX Take 150 mg by mouth at bedtime.   nitroGLYCERIN 0.4 MG SL tablet Commonly known as:  NITROSTAT PLACE 1 TABLET (0.4 MG TOTAL) UNDER THE TONGUE EVERY 5 (FIVE) MINUTES AS NEEDED FOR CHEST PAIN.   pioglitazone 45 MG tablet Commonly known as:  ACTOS Take 45 mg by mouth daily.   simvastatin 20 MG tablet Commonly known as:  ZOCOR Take 1 tablet (20 mg total) by mouth at bedtime.      Follow-up Information    Melinda Crutch, MD.   Specialty:  [facility] Contact information: [place] [address] [phone]          Allergies  Allergen Reactions  . Hctz [Hydrochlorothiazide] Other (See Comments)    Hypokalemia     Consultations:  Cardiology    Procedures/Studies: Dg Chest 2 View  Result Date: 03/31/2016 CLINICAL DATA:  Cough. EXAM: CHEST  2 VIEW COMPARISON:   07/26/2014 . FINDINGS: Mediastinum and hilar structures normal. Prior CABG. Cardiomegaly with mild pulmonary vascular prominence and interstitial prominence consistent with mild CHF. Previously identified infiltrate left upper lobe has cleared. Mid thoracic vertebral body compression fracture. Prior vertebroplasty . IMPRESSION: 1. Prior CABG. Cardiomegaly with mild interstitial prominence consistent mild CHF. 2. Previously identified left upper lobe infiltrate noted on chest x-ray of 07/26/2014 has cleared. Changes of pleuroparenchymal scarring again noted. Electronically Signed   By: Marcello Moores  Register   On: 03/31/2016 16:17    (Echo, Carotid, EGD, Colonoscopy, ERCP)    Subjective:   Discharge Exam: Vitals:   04/02/16 2056 04/03/16 0457  BP: (!) 117/48 (!) 113/40  Pulse: 78 66  Resp: 18 18  Temp: 98.9 F (37.2 C) 98.5 F (36.9 C)   Vitals:   04/02/16 0557 04/02/16  1159 04/02/16 2056 04/03/16 0457  BP: 116/68 (!) 107/44 (!) 117/48 (!) 113/40  Pulse: 93 65 78 66  Resp: 16 18 18 18   Temp: 98.3 F (36.8 C) 98.3 F (36.8 C) 98.9 F (37.2 C) 98.5 F (36.9 C)  TempSrc: Oral Oral Oral Oral  SpO2: 95% 95% 96% 99%  Weight: 72.4 kg (159 lb 11.2 oz)   72.4 kg (159 lb 11.2 oz)  Height:        General: Pt is alert, awake, not in acute distress Cardiovascular: RRR, S1/S2 +, no rubs, no gallops Respiratory: CTA bilaterally, no wheezing, no rhonchi Abdominal: Soft, NT, ND, bowel sounds + Extremities: no edema, no cyanosis    The results of significant diagnostics from this hospitalization (including imaging, microbiology, ancillary and laboratory) are listed below for reference.     Microbiology: No results found for this or any previous visit (from the past 240 hour(s)).   Labs: BNP (last 3 results)  Recent Labs  03/31/16 1619  BNP [id number]*   Basic Metabolic Panel:  Recent Labs Lab 03/31/16 1619 04/01/16 0352 04/02/16 0748 04/03/16 0224  NA 135 138 136 136  K 4.0 3.8  3.6 3.6  CL 102 105 103 103  CO2 23 21* 24 26  GLUCOSE 164* 140* 114* 98  BUN 18 17 20  24*  CREATININE 1.20 1.16 1.09 1.15  CALCIUM 9.4 9.2 8.9 8.8*   Liver Function Tests: No results for input(s): AST, ALT, ALKPHOS, BILITOT, PROT, ALBUMIN in the last 168 hours. No results for input(s): LIPASE, AMYLASE in the last 168 hours. No results for input(s): AMMONIA in the last 168 hours. CBC:  Recent Labs Lab 03/31/16 1619 04/02/16 0748 04/03/16 0224  WBC 6.5 6.7 5.2  HGB 10.1* 9.2* 9.2*  HCT 29.8* 27.5* 27.1*  MCV 91.4 91.7 90.6  PLT 201 184 191   Cardiac Enzymes:  Recent Labs Lab 03/31/16 1619 03/31/16 2141 04/01/16 0352 04/01/16 1104 04/02/16 0748  TROPONINI 0.03* 0.05* 0.05* 0.04* 0.04*   BNP: Invalid input(s): POCBNP CBG:  Recent Labs Lab 04/02/16 1034 04/02/16 1613 04/02/16 2054 04/03/16 0541 04/03/16 1029  GLUCAP 142* 121* 203* 110* 159*   D-Dimer No results for input(s): DDIMER in the last 72 hours. Hgb A1c  Recent Labs  04/01/16 0352  HGBA1C 6.5*   Lipid Profile  Recent Labs  04/02/16 0523  CHOL 105  HDL 51  LDLCALC 43  TRIG 54  CHOLHDL 2.1   Thyroid function studies  Recent Labs  03/31/16 2141 04/01/16 1104  TSH 1.009  --   T3FREE  --  2.6   Anemia work up  Recent Labs  04/01/16 1545 04/02/16 0523 04/02/16 1438  VITAMINB12  --   --  687  FOLATE  --   --  22.9  FERRITIN  --  244  --   TIBC 305  --  214*  IRON 24*  --  36*  RETICCTPCT  --   --  1.8   Urinalysis    Component Value Date/Time   COLORURINE YELLOW 02/12/2016 0950   APPEARANCEUR HAZY (A) 02/12/2016 0950   LABSPEC 1.020 02/12/2016 0950   PHURINE 6.0 02/12/2016 0950   GLUCOSEU NEGATIVE 02/12/2016 0950   HGBUR SMALL (A) 02/12/2016 0950   BILIRUBINUR NEGATIVE 02/12/2016 0950   KETONESUR NEGATIVE 02/12/2016 0950   PROTEINUR 30 (A) 02/12/2016 0950   UROBILINOGEN 1.0 12/25/2014 1257   NITRITE NEGATIVE 02/12/2016 0950   LEUKOCYTESUR LARGE (A) 02/12/2016 0950    Sepsis Labs Invalid  input(s): PROCALCITONIN,  WBC,  LACTICIDVEN Microbiology No results found for this or any previous visit (from the past 240 hour(s)).   Time coordinating discharge: Over 30 minutes  SIGNED:   Elmarie Shiley, MD  [facility] 04/03/2016, 12:06 PM Pager   If 7PM-7AM, please contact night-coverage [email or web address] Password [email or web address]

## 2016-04-03 NOTE — Discharge Summary (Signed)
Pt got discharged, discharge instructions provided and patient showed understanding to it, IV taken out,Telemonitor DC,pt left unit in wheelchair with all of the belongings accompanied with his wife. 

## 2016-04-03 NOTE — Progress Notes (Addendum)
Patient Name: [name] Date of Encounter: 04/03/2016  Primary Cardiologist: Dr Martinique  Hospital Problem List     Principal Problem:   Atrial fibrillation with rapid ventricular response ([place]) Active Problems:   CAD (coronary artery disease)   Acute on chronic combined systolic and diastolic CHF (congestive heart failure) ([place])   Essential (primary) hypertension   Type 2 diabetes mellitus with diabetic polyneuropathy (HCC)   Atrial fibrillation with RVR ([place])   Coronary artery disease involving coronary bypass graft of native heart without angina pectoris   Hyperlipidemia LDL goal <70   Murmur, cardiac     Subjective   Denies CP or dyspnea; complains of productive cough  Inpatient Medications    Scheduled Meds: . apixaban  5 mg Oral BID  . carvedilol  12.5 mg Oral BID  . digoxin  0.125 mg Oral Daily  . insulin aspart  0-5 Units Subcutaneous QHS  . insulin aspart  0-9 Units Subcutaneous TID WC  . iron polysaccharides  150 mg Oral QHS  . simvastatin  20 mg Oral QHS   Continuous Infusions:  PRN Meds: acetaminophen, ALPRAZolam, guaiFENesin-codeine, guaiFENesin-dextromethorphan, HYDROcodone-acetaminophen, ondansetron (ZOFRAN) IV, zolpidem   Vital Signs    Vitals:   04/02/16 0557 04/02/16 1159 04/02/16 2056 04/03/16 0457  BP: 116/68 (!) 107/44 (!) 117/48 (!) 113/40  Pulse: 93 65 78 66  Resp: 16 18 18 18   Temp: 98.3 F (36.8 C) 98.3 F (36.8 C) 98.9 F (37.2 C) 98.5 F (36.9 C)  TempSrc: Oral Oral Oral Oral  SpO2: 95% 95% 96% 99%  Weight: 159 lb 11.2 oz (72.4 kg)   159 lb 11.2 oz (72.4 kg)  Height:        Intake/Output Summary (Last 24 hours) at 04/03/16 1008 Last data filed at 04/03/16 0900  Gross per 24 hour  Intake              820 ml  Output              770 ml  Net               50 ml   Filed Weights   04/01/16 0807 04/02/16 0557 04/03/16 0457  Weight: 160 lb 6.4 oz (72.8 kg) 159 lb 11.2 oz (72.4 kg) 159 lb 11.2 oz (72.4 kg)    Physical Exam     GEN: Well nourished, well developed, in no acute distress.  HEENT: Grossly normal.  Neck: Supple Cardiac: Irregular; normal rate Respiratory:  CTA GI: Soft, nontender, nondistended. MS: no deformity or atrophy. Skin: warm and dry, no rash. Neuro:  Strength and sensation are intact. Psych: AAOx3.  Normal affect.  Labs    CBC  Recent Labs  04/02/16 0748 04/03/16 0224  WBC 6.7 5.2  HGB 9.2* 9.2*  HCT 27.5* 27.1*  MCV 91.7 90.6  PLT 184 [id number]   Basic Metabolic Panel  Recent Labs  04/02/16 0748 04/03/16 0224  NA 136 136  K 3.6 3.6  CL 103 103  CO2 24 26  GLUCOSE 114* 98  BUN 20 24*  CREATININE 1.09 1.15  CALCIUM 8.9 8.8*   Cardiac Enzymes  Recent Labs  04/01/16 0352 04/01/16 1104 04/02/16 0748  TROPONINI 0.05* 0.04* 0.04*   Hemoglobin A1C  Recent Labs  04/01/16 0352  HGBA1C 6.5*   Fasting Lipid Panel  Recent Labs  04/02/16 0523  CHOL 105  HDL 51  LDLCALC 43  TRIG 54  CHOLHDL 2.1   Thyroid Function Tests  Recent Labs  03/31/16 2141 04/01/16 1104  TSH 1.009  --   T3FREE  --  2.6    Telemetry    Atrial fibrillation rate controlled - Personally Reviewed   Patient Profile     [name] is a 76 y.o. male with a history of HTN, DMT2, PAD s/p bilateral femoral endarterectomy and fem- fem BPG, CAD s/p CABG (redo CABG in 2005 after emergent stenting of left main), chronic combined S/D CHF (EF 45% 07/2014) who presented to [facility] ED from PCP office for new onset atrial fibrillation with RVR.  Assessment & Plan    1 new-onset atrial fibrillation-TSH normal. Echo with EF 40-45 (similar to previous). Heart rate better; continue beta blocker and digoxin. CHADSvasc 6. Continue apixaban 5 mg Bid. Pt asymptomatic; he can be DCed from a cardiac standpoint and fu with APP one week to arrange DCCV in 4 weeks and then FU with Dr Martinique.  2 acute on chronic combined systolic/diastolic congestive heart failure-much improved following Lasix.  3  coronary artery disease-given need for anticoagulation aspirin and Plavix DCed. Continue statin.  4 minimally elevated troponin-not consistent with acute coronary syndrome.  5 normocytic anemia-further evaluation per primary care.  6 Possible bronchitis-may need antibiotic; will leave to primary care.  Signed, Kirk Ruths, MD  04/03/2016, 10:08 AM

## 2016-04-04 ENCOUNTER — Telehealth: Payer: Self-pay | Admitting: Cardiology

## 2016-04-04 NOTE — Telephone Encounter (Signed)
SPOKE TO PATIENT . USE SALINE NASAL SPRAY , USE HUMIDIFIER IF POSSIBLE. CONTINUE TO USE ELIQIUS  IF BLEEDING CONTINUES WITHOUT PATIENT ABLE TO STOP IT GO TO ER.  PATEINT VERBALIZED UNDERSTANDING . WILL KEEP APPOINTMENT WITH RHONDA

## 2016-04-04 NOTE — Telephone Encounter (Signed)
New Message   Pt was taking   apixaban (ELIQUIS) 5 MG TABS tablet   Was told if he ever had blood in mucous he needed to call in. Pt scheduled appt with Rosaria Ferries for tomorrow.

## 2016-04-05 ENCOUNTER — Encounter: Payer: Self-pay | Admitting: Physician Assistant

## 2016-04-05 ENCOUNTER — Telehealth: Payer: Self-pay | Admitting: Cardiology

## 2016-04-05 ENCOUNTER — Ambulatory Visit (INDEPENDENT_AMBULATORY_CARE_PROVIDER_SITE_OTHER): Payer: Medicare Other | Admitting: Physician Assistant

## 2016-04-05 VITALS — BP 146/69 | HR 66 | Ht 69.0 in | Wt 169.8 lb

## 2016-04-05 DIAGNOSIS — I5022 Chronic systolic (congestive) heart failure: Secondary | ICD-10-CM | POA: Diagnosis not present

## 2016-04-05 DIAGNOSIS — Z7901 Long term (current) use of anticoagulants: Secondary | ICD-10-CM

## 2016-04-05 DIAGNOSIS — I25810 Atherosclerosis of coronary artery bypass graft(s) without angina pectoris: Secondary | ICD-10-CM

## 2016-04-05 DIAGNOSIS — I481 Persistent atrial fibrillation: Secondary | ICD-10-CM | POA: Diagnosis not present

## 2016-04-05 DIAGNOSIS — I10 Essential (primary) hypertension: Secondary | ICD-10-CM

## 2016-04-05 DIAGNOSIS — I4819 Other persistent atrial fibrillation: Secondary | ICD-10-CM

## 2016-04-05 NOTE — Telephone Encounter (Signed)
Returned call to patient-pt states he has appointment today at 1:30 and was wondering if he needed to come.    Advised patient he should keep appointment as he was recently d/c from hospital.    Pt continued to state "they wanted me to see a cardiologist in 2 weeks but haven't made an appointment".  Continued to explain that Rosaria Ferries PA is a cardiology PA who works together with our Cardiologist MDs and they will inform him when he needs to be seen again today at his [place].  Pt verbalized understanding and states he will be here today for scheduled OV.

## 2016-04-05 NOTE — Telephone Encounter (Signed)
New Message  Pt voiced he is wanting to speak with nurse and want them to call him back.  Please f/u

## 2016-04-05 NOTE — Progress Notes (Signed)
Cardiology Office Note   Date:  04/05/2016   ID:  [name], DOB 1940/04/16, MRN [id number]  PCP:  Melinda Crutch, MD  Cardiologist:  Dr Martinique  Barrett, Rhonda, PA-C   Chief Complaint  Patient presents with  . Follow-up    6 months; Pt states no Sx.     History of Present Illness: [name] is a 76 y.o. male with a history of  HTN, DMT2, PAD s/p bilateral femoral endarterectomy and fem- fem BPG (12/2014), CAD s/p CABG (1987 with redo CABG in 2005 after emergent stenting of left main), chronic combined S/D CHF (EF 45% 07/2014)   Admit 01/04-01/07 w/ URI, CHF, new dx atrial fib. Rate control w/ Coreg & dig. Bradycardia w/ Cardizem. Plan DCCV in 4 weeks. TSH nl, EF 40-45% CHA2DS2VASc=6 (age x 2, HTN, DM, CAD, CHF)  [name] presents for post-hospital.   Since dc, he has done pretty well. He is not aware of the atrial fib. He does not ever feel his heart beat. He has not been light-headed or dizzy. He has not had bleeding issues.   He is breathing ok. He is still coughing, not coughing up anything. No LE edema. Some DOE, but not too bad. He is able to walk around the house x 20 minutes and gets a little SOB with this. Denies orthopnea or PND. Definitely was breathing better after the Lasix in the hospital. He knows his weight is up today, but feels it is mostly clothes and shoes. He does not eat a high-sodium diet.  Knows his BP is up today, but says it has been low at times at home. Also notes that his HR would go up to 140 with activity.   Past Medical History:  Diagnosis Date  . Acute inferior myocardial infarction (HCC)    hx  . Acute on chronic diastolic CHF (congestive heart failure), NYHA class 1 ([place]) 08/09/2014  . Anemia   . BPH (benign prostatic hyperplasia)   . CAD (coronary artery disease)   . Dyslipidemia   . History of blood transfusion    "I've had one; don't remember when"  . HTN (hypertension)   . Hyponatremia   . 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 Myocardial infarction x2  1986, 2005  . 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 PAD (peripheral artery disease) ([place])   . Type II diabetes mellitus ([place])     Past Surgical History:  Procedure Laterality Date  . CARDIAC CATHETERIZATION  12/23/2014   Procedure: Left Heart Cath and Cors/Grafts Angiography;  Surgeon: Peter M Martinique, MD;  Location: [place] [facility];  Service: Cardiovascular;;  . CATARACT EXTRACTION W/ INTRAOCULAR LENS  IMPLANT, BILATERAL Bilateral   . CORONARY ANGIOPLASTY WITH STENT PLACEMENT    . CORONARY ARTERY BYPASS GRAFT  1986; redo 2005   ; free Rima to OM, svg-diag,svg-pda  . ENDARTERECTOMY FEMORAL Bilateral 12/31/2014   Procedure: BILATERAL FEMORAL ENDARTERECTOMY;  Surgeon: Elam Dutch, MD;  Location: [place];  Service: Vascular;  Laterality: Bilateral;  . FEMORAL-FEMORAL BYPASS GRAFT Bilateral 12/31/2014   Procedure: FEMORAL-FEMORAL ARTERY BYPASS GRAFT;  Surgeon: Elam Dutch, MD;  Location: [place];  Service: Vascular;  Laterality: Bilateral;  . KYPHOPLASTY  02/29/2012   Procedure: KYPHOPLASTY;  Surgeon: Sinclair Ship, MD;  Location: [place];  Service: Orthopedics;  Laterality: Bilateral;  T 10 kyphoplasty  . LAPAROTOMY N/A 08/02/2014   Procedure: EXPLORATORY LAPAROTOMY  LYSIS OF ADHESIONS;  Surgeon: Donnie Mesa, MD;  Location: [place];  Service: General;  Laterality: N/A;  . MELANOMA EXCISION     scalp  . PERIPHERAL VASCULAR CATHETERIZATION N/A 12/05/2014   Procedure: Abdominal Aortogram;  Surgeon: Elam Dutch, MD;  Location: [place] [facility];  Service: Cardiovascular;  Laterality: N/A;  . ROTATOR CUFF REPAIR Right   . TONSILLECTOMY      Current Outpatient Prescriptions  Medication Sig Dispense Refill  . ALPRAZolam (XANAX) 0.5 MG tablet Take 0.5 mg by mouth 3 (three) times daily as needed for anxiety.     Marland Kitchen amoxicillin-clavulanate (AUGMENTIN) 875-125 MG tablet Take 1 tablet by mouth every 12 (twelve) hours. 10 tablet 0  .  apixaban (ELIQUIS) 5 MG TABS tablet Take 1 tablet (5 mg total) by mouth 2 (two) times daily. 60 tablet 0  . aspirin 81 MG tablet Take 81 mg by mouth daily.      . carvedilol (COREG) 12.5 MG tablet TAKE 1 TABLET TWICE A DAY 180 tablet 2  . digoxin (LANOXIN) 0.125 MG tablet Take 1 tablet (0.125 mg total) by mouth daily. 30 tablet 0  . glimepiride (AMARYL) 4 MG tablet Take 4 mg by mouth daily at 2 PM.     . guaifenesin (ROBITUSSIN) 100 MG/5ML syrup Take 200 mg by mouth 3 (three) times daily as needed for cough.    . iron polysaccharides (NIFEREX) 150 MG capsule Take 150 mg by mouth at bedtime.     . nitroGLYCERIN (NITROSTAT) 0.4 MG SL tablet PLACE 1 TABLET (0.4 MG TOTAL) UNDER THE TONGUE EVERY 5 (FIVE) MINUTES AS NEEDED FOR CHEST PAIN. 25 tablet 2  . pioglitazone (ACTOS) 45 MG tablet Take 45 mg by mouth daily.     . simvastatin (ZOCOR) 20 MG tablet Take 1 tablet (20 mg total) by mouth at bedtime. 90 tablet 3  . Throat Lozenges (COUGH DROPS MT) Use as directed 1 lozenge in the mouth or throat daily as needed (cough).     No current facility-administered medications for this visit.     Allergies:   Hctz [hydrochlorothiazide]    Social History:  The patient  reports that he quit smoking about 30 years ago. His smoking use included Cigarettes. He has a 50.00 pack-year smoking history. He has never used smokeless tobacco. He reports that he drinks about 3.6 oz of alcohol per week . He reports that he does not use drugs.   Family History:  The patient's family history includes Dementia in his mother.    ROS:  Please see the history of present illness. All other systems are reviewed and negative.    PHYSICAL EXAM: VS:  BP (!) 146/69   Pulse 66   Ht 5\' 9"  (1.753 m)   Wt 169 lb 12.8 oz (77 kg)   BMI 25.08 kg/m  , BMI Body mass index is 25.08 kg/m. GEN: Well nourished, well developed, male in no acute distress  HEENT: normal for age  Neck: JVD at 8-9 cm, no carotid bruit, no masses Cardiac:  Irreg R&R; 2/6 murmur, no rubs, or gallops Respiratory: decreased BS bases bilaterally, normal work of breathing GI: soft, nontender, nondistended, + BS MS: no deformity or atrophy; no edema; distal pulses are 2+ in all 4 extremities   Skin: warm and dry, no rash Neuro:  Strength and sensation are intact Psych: euthymic mood, full affect   EKG:  EKG is not ordered today.  ECHO: 04/02/2016 - Left ventricle: The cavity size  was mildly dilated. Wall   thickness was normal. Systolic function was mildly to moderately   reduced. The estimated ejection fraction was in the range of 40%   to 45%. There is akinesis of the inferolateral myocardium. - Mitral valve: Calcified annulus. There was mild regurgitation. - Aortic valve: Indexed valve area (VTI): 1.11 cm^2/m^2. Peak velocity ratio of LVOT to aortic valve: 0.71. Mean gradient (S): 5 mm Hg. Peak gradient (S): 9 mm Hg. - Left atrium: The atrium was moderately dilated. - Pulmonary arteries: Systolic pressure was moderately increased.   PA peak pressure: 51 mm Hg (S). Impressions: - Akinesis of the inferolateral wall with overall mild to moderate   LV dysfunction; mild MR; moderate LAE; mild TR; moderately   elevated pulmonary pressure.  Dg Chest 2 View Result Date: 03/31/2016 CLINICAL DATA:  Cough. EXAM: CHEST  2 VIEW COMPARISON:  07/26/2014 . FINDINGS: Mediastinum and hilar structures normal. Prior CABG. Cardiomegaly with mild pulmonary vascular prominence and interstitial prominence consistent with mild CHF. Previously identified infiltrate left upper lobe has cleared. Mid thoracic vertebral body compression fracture. Prior vertebroplasty . IMPRESSION: 1. Prior CABG. Cardiomegaly with mild interstitial prominence consistent mild CHF. 2. Previously identified left upper lobe infiltrate noted on chest x-ray of 07/26/2014 has cleared. Changes of pleuroparenchymal scarring again noted. Electronically Signed   By: Marcello Moores  Register   On: 03/31/2016  16:17     Recent Labs: 03/31/2016: B Natriuretic Peptide 225.4; TSH 1.009 04/03/2016: BUN 24; Creatinine, Ser 1.15; Hemoglobin 9.2; Platelets 191; Potassium 3.6; Sodium 136    Lipid Panel    Component Value Date/Time   CHOL 105 04/02/2016 0523   TRIG 54 04/02/2016 0523   HDL 51 04/02/2016 0523   CHOLHDL 2.1 04/02/2016 0523   VLDL 11 04/02/2016 0523   LDLCALC 43 04/02/2016 0523     Wt Readings from Last 3 Encounters:  04/05/16 169 lb 12.8 oz (77 kg)  04/03/16 159 lb 11.2 oz (72.4 kg)  01/21/16 180 lb (81.6 kg)     Other studies Reviewed: Additional studies/ records that were reviewed today include: hospital records, office notes and testing.  ASSESSMENT AND PLAN:  1.  Chronic systolic CHF: Pt has mild volume overload by exam. However, he is still recovering from his URI. He feels his SOB is almost at baseline. Asked him to follow his weight and be careful about sodium. If he gains weight or gets SOB, call us, we can start Lasix 20 mg qd.   2. HTN: His BP is up today in the office, but he says he has had readings approximately 100 elsewhere. Follow for now.   3. Persistent atrial fib: still in afib. Explained that we could not do DCCV in less than 4 weeks without a TEE. He does not want a TEE. Will have him follow up in a month and schedule DCCV at that time if he is still in afib.   As L atrium is moderately dilated and pt is 75 and not having many sx, MD advise if pursuing SR is necessary. Pt may do just as well if we rate-control him.  4. Chronic anticoagulation: pt admits he has missed a couple of doses of Eliquis. Emphasized compliance and that we could not do DCCV unless no doses missed.  5. CAD: No ischemic sx. On ASA, Coreg, statin  Current medicines are reviewed at length with the patient today.  The patient does not have concerns regarding medicines.  The following changes have been made:  no  change  Labs/ tests ordered today include:  No orders of the defined  types were placed in this encounter.    Disposition:   FU with Dr Martinique  Signed, Lenoard Aden  04/05/2016 4:39 PM    [place] Phone: [phone]; Fax: [phone]  This note was written with the assistance of speech recognition software. Please excuse any transcriptional errors.

## 2016-04-05 NOTE — Patient Instructions (Signed)
Medication Instructions:  No changes   If you need a refill on your cardiac medications before your next appointment, please call your pharmacy.  Labwork: none  Follow-Up: FOLLOW UP IN 1 MONTH WITH RHONDA ON A DAY WHEN DR Martinique IS HERE TO DISCUSS CARDIOVERSION  Special Instructions: DO DAILY WEIGHTS AND LOG DAILY  CALL us IF WEIGHT GAIN 3 PONDS IN 1 DAY OR 5 POUNDS IN ONE WEEK BRING WITH YOU TO YOUR NEXT APPOINTMENT IN 1 MONTH  CALL us IF SHORTNESS OF BREATH BECOMES WORSE     Thank you for choosing [facility] at [employer], LPN RHONDA BARRETT PA-C

## 2016-04-29 ENCOUNTER — Encounter: Payer: Self-pay | Admitting: Physician Assistant

## 2016-04-29 ENCOUNTER — Ambulatory Visit (INDEPENDENT_AMBULATORY_CARE_PROVIDER_SITE_OTHER): Payer: Medicare Other | Admitting: Physician Assistant

## 2016-04-29 ENCOUNTER — Encounter: Payer: Self-pay | Admitting: Cardiology

## 2016-04-29 VITALS — BP 164/65 | HR 55 | Ht 69.0 in | Wt 172.0 lb

## 2016-04-29 DIAGNOSIS — Z01812 Encounter for preprocedural laboratory examination: Secondary | ICD-10-CM

## 2016-04-29 DIAGNOSIS — Z7901 Long term (current) use of anticoagulants: Secondary | ICD-10-CM

## 2016-04-29 DIAGNOSIS — Z79899 Other long term (current) drug therapy: Secondary | ICD-10-CM

## 2016-04-29 DIAGNOSIS — I481 Persistent atrial fibrillation: Secondary | ICD-10-CM | POA: Diagnosis not present

## 2016-04-29 DIAGNOSIS — I25810 Atherosclerosis of coronary artery bypass graft(s) without angina pectoris: Secondary | ICD-10-CM

## 2016-04-29 DIAGNOSIS — E1151 Type 2 diabetes mellitus with diabetic peripheral angiopathy without gangrene: Secondary | ICD-10-CM

## 2016-04-29 DIAGNOSIS — I4819 Other persistent atrial fibrillation: Secondary | ICD-10-CM

## 2016-04-29 LAB — BASIC METABOLIC PANEL
BUN: 22 mg/dL (ref 7–25)
CHLORIDE: 105 mmol/L (ref 98–110)
CO2: 27 mmol/L (ref 20–31)
Calcium: 9.7 mg/dL (ref 8.6–10.3)
Creat: 0.97 mg/dL (ref 0.70–1.18)
GLUCOSE: 157 mg/dL — AB (ref 65–99)
POTASSIUM: 4.6 mmol/L (ref 3.5–5.3)
SODIUM: 140 mmol/L (ref 135–146)

## 2016-04-29 LAB — CBC
HEMATOCRIT: 31.8 % — AB (ref 38.5–50.0)
HEMOGLOBIN: 10.6 g/dL — AB (ref 13.2–17.1)
MCH: 31.4 pg (ref 27.0–33.0)
MCHC: 33.3 g/dL (ref 32.0–36.0)
MCV: 94.1 fL (ref 80.0–100.0)
MPV: 9.8 fL (ref 7.5–12.5)
Platelets: 144 10*3/uL (ref 140–400)
RBC: 3.38 MIL/uL — ABNORMAL LOW (ref 4.20–5.80)
RDW: 15.4 % — ABNORMAL HIGH (ref 11.0–15.0)
WBC: 4.3 10*3/uL (ref 3.8–10.8)

## 2016-04-29 NOTE — Patient Instructions (Addendum)
Your physician has recommended you make the following change in your medication:  -- DECREASE digoxin to half tablet daily  Your physician has recommended that you have a Cardioversion (DCCV) on Thursday or Friday of next week @ [facility]. Electrical Cardioversion uses a jolt of electricity to your heart either through paddles or wired patches attached to your chest. This is a controlled, usually prescheduled, procedure. Defibrillation is done under light anesthesia in the hospital, and you usually go home the day of the procedure. This is done to get your heart back into a normal rhythm. You are not awake for the procedure. Please see the instruction sheet given to you today.  1. Blood work - the blood work can be done no more than 14 days prior to the procedure.  It can be done at any [facility] lab. There is a lab downstairs on the first floor of this building in [address] and one at [address].    Your physician recommends that you schedule a follow-up appointment Monday March 5 @ 1:30pm w/Dr. Martinique

## 2016-04-29 NOTE — Progress Notes (Signed)
Cardiology Office Note   Date:  04/29/2016   ID:  [name], DOB [birth year], MRN [id number]  PCP:  Melinda Crutch, MD  Cardiologist:  Dr. Martinique 09/03/2015 Rosaria Ferries, PA-C 04/05/2016  Chief Complaint  Patient presents with  . Follow-up    one month visit to dicuss cardioversion    History of Present Illness: [name] is a 76 y.o. 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 Rate control w/ Coreg & dig. Bradycardia w/ Cardizem. Plan DCCV in 4 weeks. TSH nl, EF 40-45% CHA2DS2VASc=6 (age x 2, HTN, DM, CAD, CHF) 04/05/2016 office visit, patient still in atrial fibrillation, follow-up office visit planned and then schedule cardioversion if he remains in A. fib. Eliquis compliance emphasized  [name] presents for cardiology follow up  He is having fatigue and some dizziness. Feels this must be from the atrial fib.   No chest pain, he is not as active as he would like to be. He can walk up stairs but gets tired doing it. He works around [employer], works in the yard when the weather is good. He recently had to use a wheelbarrow to take wood to the house, did not get CP with this.   His DOE has not changed recently. He has some ankle swelling chronically, no recent change.    Past Medical History:  Diagnosis Date  . Acute inferior myocardial infarction (HCC)    hx  . Acute on chronic diastolic CHF (congestive heart failure), NYHA class 1 ([place]) 08/09/2014  . Anemia   . BPH (benign prostatic hyperplasia)   . CAD (coronary artery disease)   . Dyslipidemia   . History of blood transfusion    "I've had one; don't remember when"  . HTN (hypertension)   . Hyponatremia   . 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 Myocardial  infarction x2  1986, 2005  . 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 PAD (peripheral artery disease) ([place])   . Type II diabetes mellitus ([place])     Past Surgical History:  Procedure Laterality Date  . CARDIAC CATHETERIZATION  12/23/2014   Procedure: Left Heart Cath and Cors/Grafts Angiography;  Surgeon: Peter M Martinique, MD;  Location: [facility];  Service: Cardiovascular;;  . CATARACT EXTRACTION W/ INTRAOCULAR LENS  IMPLANT, BILATERAL Bilateral   . CORONARY ANGIOPLASTY WITH STENT PLACEMENT    . CORONARY ARTERY BYPASS GRAFT  1986; redo 2005   ; free Rima to OM, svg-diag,svg-pda  . ENDARTERECTOMY FEMORAL Bilateral 12/31/2014   Procedure: BILATERAL FEMORAL ENDARTERECTOMY;  Surgeon: Elam Dutch, MD;  Location: [facility];  Service: Vascular;  Laterality: Bilateral;  . FEMORAL-FEMORAL BYPASS GRAFT Bilateral 12/31/2014   Procedure: FEMORAL-FEMORAL ARTERY BYPASS GRAFT;  Surgeon: Elam Dutch, MD;  Location: [facility];  Service: Vascular;  Laterality: Bilateral;  . KYPHOPLASTY  02/29/2012   Procedure: KYPHOPLASTY;  Surgeon: Sinclair Ship, MD;  Location: [facility];  Service: Orthopedics;  Laterality: Bilateral;  T 10 kyphoplasty  . LAPAROTOMY N/A 08/02/2014   Procedure: EXPLORATORY LAPAROTOMY LYSIS OF ADHESIONS;  Surgeon: Donnie Mesa, MD;  Location: [facility];  Service: General;  Laterality: N/A;  . MELANOMA EXCISION  scalp  . PERIPHERAL VASCULAR CATHETERIZATION N/A 12/05/2014   Procedure: Abdominal Aortogram;  Surgeon: Elam Dutch, MD;  Location: [facility];  Service: Cardiovascular;  Laterality: N/A;  . ROTATOR CUFF REPAIR Right   . TONSILLECTOMY      Medication Sig  . ALPRAZolam (XANAX) 0.5 MG tablet Take 0.5 mg by mouth 3 (three) times daily as needed for anxiety.   Marland Kitchen amoxicillin-clavulanate (AUGMENTIN) 875-125 MG tablet Take 1 tablet by mouth every 12 (twelve) hours.  Marland Kitchen apixaban (ELIQUIS) 5 MG TABS tablet Take 1 tablet (5 mg total) by mouth 2 (two) times daily.  Marland Kitchen  aspirin 81 MG tablet Take 81 mg by mouth daily.    . carvedilol (COREG) 12.5 MG tablet TAKE 1 TABLET TWICE A DAY  . digoxin (LANOXIN) 0.125 MG tablet Take 1 tablet (0.125 mg total) by mouth daily.  Marland Kitchen glimepiride (AMARYL) 4 MG tablet Take 4 mg by mouth daily at 2 PM.   . guaifenesin (ROBITUSSIN) 100 MG/5ML syrup Take 200 mg by mouth 3 (three) times daily as needed for cough.  . iron polysaccharides (NIFEREX) 150 MG capsule Take 150 mg by mouth at bedtime.   . nitroGLYCERIN (NITROSTAT) 0.4 MG SL tablet PLACE 1 TABLET (0.4 MG TOTAL) UNDER THE TONGUE EVERY 5 (FIVE) MINUTES AS NEEDED FOR CHEST PAIN.  Marland Kitchen pioglitazone (ACTOS) 45 MG tablet Take 45 mg by mouth daily.   . simvastatin (ZOCOR) 20 MG tablet Take 1 tablet (20 mg total) by mouth at bedtime.  . Throat Lozenges (COUGH DROPS MT) Use as directed 1 lozenge in the mouth or throat daily as needed (cough).   No current facility-administered medications for this visit.     Allergies:   Hctz [hydrochlorothiazide]    Social History:  The patient  reports that he quit smoking about 30 years ago. His smoking use included Cigarettes. He has a 50.00 pack-year smoking history. He has never used smokeless tobacco. He reports that he drinks about 3.6 oz of alcohol per week . He reports that he does not use drugs.   Family History:  The patient's family history includes Dementia in his mother.    ROS:  Please see the history of present illness. All other systems are reviewed and negative.    PHYSICAL EXAM: VS:  BP (!) 164/65   Pulse (!) 55   Ht 5\' 9"  (1.753 m)   Wt 172 lb (78 kg)   BMI 25.40 kg/m  , BMI Body mass index is 25.4 kg/m. GEN: Well nourished, well developed, male in no acute distress  HEENT: normal for age  Neck: no JVD, faint R carotid bruit, no masses Cardiac: Irreg R&R; soft murmur, no rubs, or gallops Respiratory:  clear to auscultation bilaterally, normal work of breathing GI: soft, nontender, nondistended, + BS MS: no deformity  or atrophy; trace pedal edema; distal pulses are 2+ in all 4 extremities   Skin: warm and dry, no rash Neuro:  Strength and sensation are intact Psych: euthymic mood, full affect   EKG:  EKG is ordered today. The ekg ordered today demonstrates Atrial fib, HR 55. Diffuse T wave flattening, mildly different from 04/02/2016  ECHO: 04/02/2016 - Left ventricle: The cavity size was mildly dilated. Wall thickness was normal. Systolic function was mildly to moderately reduced. The estimated ejection fraction was in the range of 40% to 45%. There is akinesis of the inferolateral myocardium. - Mitral valve: Calcified annulus. There was mild regurgitation. - Aortic valve: Indexed valve area (  VTI): 1.11 cm^2/m^2. Peak velocity ratio of LVOT to aortic valve: 0.71. Mean gradient (S): 5 mm Hg. Peak gradient (S): 9 mm Hg. - Left atrium: The atrium was moderately dilated. - Pulmonary arteries: Systolic pressure was moderately increased. PA peak pressure: 51 mm Hg (S). Impressions: - Akinesis of the inferolateral wall with overall mild to moderate LV dysfunction; mild MR; moderate LAE; mild TR; moderately elevated pulmonary pressure.  Dg Chest 2 View Result Date: 03/31/2016 CLINICAL DATA:  Cough. EXAM: CHEST  2 VIEW COMPARISON:  07/26/2014 . FINDINGS: Mediastinum and hilar structures normal. Prior CABG. Cardiomegaly with mild pulmonary vascular prominence and interstitial prominence consistent with mild CHF. Previously identified infiltrate left upper lobe has cleared. Mid thoracic vertebral body compression fracture. Prior vertebroplasty . IMPRESSION: 1. Prior CABG. Cardiomegaly with mild interstitial prominence consistent mild CHF. 2. Previously identified left upper lobe infiltrate noted on chest x-ray of 07/26/2014 has cleared. Changes of pleuroparenchymal scarring again noted. Electronically Signed   By: Marcello Moores  Register   On: 03/31/2016 16:17   Recent Labs: 03/31/2016: B Natriuretic  Peptide 225.4; TSH 1.009 04/03/2016: BUN 24; Creatinine, Ser 1.15; Hemoglobin 9.2; Platelets 191; Potassium 3.6; Sodium 136    Lipid Panel    Component Value Date/Time   CHOL 105 04/02/2016 0523   TRIG 54 04/02/2016 0523   HDL 51 04/02/2016 0523   CHOLHDL 2.1 04/02/2016 0523   VLDL 11 04/02/2016 0523   LDLCALC 43 04/02/2016 0523     Wt Readings from Last 3 Encounters:  04/29/16 172 lb (78 kg)  04/05/16 169 lb 12.8 oz (77 kg)  04/03/16 159 lb 11.2 oz (72.4 kg)     Other studies Reviewed: Additional studies/ records that were reviewed today include: office notes, hospital records and testing.  ASSESSMENT AND PLAN:  1.  Persistent atrial fibrillation: His heart rate is controlled, even a little slow, but he still symptomatic. I believe he would benefit from sinus rhythm. As he has been compliant with anticoagulation for greater than 3 weeks, no TEE will be needed.   We will schedule this for next week.   His rate is low right now, we will decrease his digoxin to one half tablet daily, may be able to discontinue it completely after the cardioversion.  2. Chronic anticoagulation: He has been compliant with the Eliquis and not missed any doses since his last office visit. Continue Eliquis, compliance was emphasized.  3. CAD: He is not having any ischemic symptoms. Continue low-dose aspirin, beta blocker, statin.  4. Diabetes: Take his diabetes medications after the cardioversion on that day.   Current medicines are reviewed at length with the patient today.  The patient does not have concerns regarding medicines.  The following changes have been made:  Decrease digoxin  Labs/ tests ordered today include:   Orders Placed This Encounter  Procedures  . ELECTRICAL CARDIOVERSION  . Basic metabolic panel  . CBC  . EKG 12-Lead     Disposition:   FU with Dr. Martinique  Signed, Rosaria Ferries, PA-C  04/29/2016 2:23 PM    [place] Phone: [phone]; Fax: [phone]  This note was written with the assistance of speech recognition software. Please excuse any transcriptional errors.

## 2016-05-04 NOTE — Anesthesia Preprocedure Evaluation (Addendum)
Anesthesia Evaluation  Patient identified by MRN, date of birth, ID band Patient awake    Reviewed: Allergy & Precautions, NPO status , Patient's Chart, lab work & pertinent test results  Airway Mallampati: II  TM Distance: >3 FB Neck ROM: Full    Dental   Pulmonary former smoker,    breath sounds clear to auscultation       Cardiovascular hypertension, Pt. on medications and Pt. on home beta blockers + CAD, + Past MI, + Cardiac Stents, + CABG, + Peripheral Vascular Disease and +CHF  + dysrhythmias Atrial Fibrillation  Rhythm:Regular Rate:Normal  Akinesis of the inferolateral wall with overall mild to moderate LV dysfunction; mild MR; moderate LAE; mild TR; moderately elevated pulmonary pressure. EF 40-45%   Neuro/Psych negative neurological ROS     GI/Hepatic negative GI ROS, Neg liver ROS,   Endo/Other  diabetes, Type 2  Renal/GU negative Renal ROS     Musculoskeletal  (+) Arthritis ,   Abdominal   Peds  Hematology  (+) anemia ,   Anesthesia Other Findings   Reproductive/Obstetrics                            Anesthesia Physical Anesthesia Plan  ASA: III  Anesthesia Plan: General   Post-op Pain Management:    Induction: Intravenous  Airway Management Planned: Natural Airway and Mask  Additional Equipment:   Intra-op Plan:   Post-operative Plan:   Informed Consent: I have reviewed the patients History and Physical, chart, labs and discussed the procedure including the risks, benefits and alternatives for the proposed anesthesia with the patient or authorized representative who has indicated his/her understanding and acceptance.     Plan Discussed with: CRNA  Anesthesia Plan Comments:        Anesthesia Quick Evaluation

## 2016-05-05 ENCOUNTER — Encounter (HOSPITAL_COMMUNITY): Payer: Self-pay | Admitting: *Deleted

## 2016-05-05 ENCOUNTER — Ambulatory Visit (HOSPITAL_COMMUNITY): Payer: Medicare Other | Admitting: Anesthesiology

## 2016-05-05 ENCOUNTER — Ambulatory Visit (HOSPITAL_COMMUNITY)
Admission: RE | Admit: 2016-05-05 | Discharge: 2016-05-05 | Disposition: A | Discharge status: Home / Self Care | Payer: Medicare Other | Source: Ambulatory Visit | Attending: Cardiology | Admitting: Cardiology

## 2016-05-05 ENCOUNTER — Encounter (HOSPITAL_COMMUNITY)
Admission: RE | Disposition: A | Discharge status: Home / Self Care | Payer: Self-pay | Source: Ambulatory Visit | Attending: Cardiology

## 2016-05-05 DIAGNOSIS — Z951 Presence of aortocoronary bypass graft: Secondary | ICD-10-CM | POA: Insufficient documentation

## 2016-05-05 DIAGNOSIS — E119 Type 2 diabetes mellitus without complications: Secondary | ICD-10-CM | POA: Insufficient documentation

## 2016-05-05 DIAGNOSIS — Z7982 Long term (current) use of aspirin: Secondary | ICD-10-CM | POA: Insufficient documentation

## 2016-05-05 DIAGNOSIS — I110 Hypertensive heart disease with heart failure: Secondary | ICD-10-CM | POA: Diagnosis not present

## 2016-05-05 DIAGNOSIS — Z79899 Other long term (current) drug therapy: Secondary | ICD-10-CM | POA: Insufficient documentation

## 2016-05-05 DIAGNOSIS — I5042 Chronic combined systolic (congestive) and diastolic (congestive) heart failure: Secondary | ICD-10-CM | POA: Diagnosis not present

## 2016-05-05 DIAGNOSIS — Z87891 Personal history of nicotine dependence: Secondary | ICD-10-CM | POA: Insufficient documentation

## 2016-05-05 DIAGNOSIS — Z95820 Peripheral vascular angioplasty status with implants and grafts: Secondary | ICD-10-CM | POA: Insufficient documentation

## 2016-05-05 DIAGNOSIS — E785 Hyperlipidemia, unspecified: Secondary | ICD-10-CM | POA: Insufficient documentation

## 2016-05-05 DIAGNOSIS — I4819 Other persistent atrial fibrillation: Secondary | ICD-10-CM

## 2016-05-05 DIAGNOSIS — Z7901 Long term (current) use of anticoagulants: Secondary | ICD-10-CM | POA: Insufficient documentation

## 2016-05-05 DIAGNOSIS — D649 Anemia, unspecified: Secondary | ICD-10-CM | POA: Diagnosis not present

## 2016-05-05 DIAGNOSIS — I2510 Atherosclerotic heart disease of native coronary artery without angina pectoris: Secondary | ICD-10-CM | POA: Insufficient documentation

## 2016-05-05 DIAGNOSIS — Z7984 Long term (current) use of oral hypoglycemic drugs: Secondary | ICD-10-CM | POA: Insufficient documentation

## 2016-05-05 DIAGNOSIS — Z955 Presence of coronary angioplasty implant and graft: Secondary | ICD-10-CM | POA: Diagnosis not present

## 2016-05-05 DIAGNOSIS — I4892 Unspecified atrial flutter: Secondary | ICD-10-CM | POA: Insufficient documentation

## 2016-05-05 DIAGNOSIS — Z85820 Personal history of malignant melanoma of skin: Secondary | ICD-10-CM | POA: Diagnosis not present

## 2016-05-05 DIAGNOSIS — I252 Old myocardial infarction: Secondary | ICD-10-CM | POA: Diagnosis not present

## 2016-05-05 HISTORY — PX: CARDIOVERSION: SHX1299

## 2016-05-05 LAB — GLUCOSE, CAPILLARY: GLUCOSE-CAPILLARY: 162 mg/dL — AB (ref 65–99)

## 2016-05-05 SURGERY — CARDIOVERSION
Anesthesia: General

## 2016-05-05 MED ORDER — LIDOCAINE HCL (CARDIAC) 20 MG/ML IV SOLN
INTRAVENOUS | Status: DC | PRN
  Administered 2016-05-05: 20 mg via INTRATRACHEAL

## 2016-05-05 MED ORDER — SODIUM CHLORIDE 0.9 % IV SOLN
INTRAVENOUS | Status: DC
  Administered 2016-05-05: 08:00:00 via INTRAVENOUS

## 2016-05-05 MED ORDER — PROPOFOL 10 MG/ML IV BOLUS
INTRAVENOUS | Status: DC | PRN
  Administered 2016-05-05: 50 mg via INTRAVENOUS

## 2016-05-05 NOTE — H&P (Signed)
04/29/2016 1:30 PM  Office Visit  MRN:  [id number]  Description: Male DOB: 1940/09/24 Provider: Lonn Georgia, PA-C Department: [facility]  Vitals   BP    164/65   Pulse    55   Ht  5\' 9"  (1.753 m)   Wt  172 lb (78 kg)   BMI  25.40 kg/m    Progress Notes   Lonn Georgia, PA-C at 04/29/2016 1:30 PM   Status: Signed  Expand All Collapse All       Cardiology Office Note   Date:  04/29/2016   ID:  MATAYO UPADHYAY, DOB 1940/06/24, MRN [id number]  PCP:  Melinda Crutch, MD  Cardiologist:  Dr. Martinique 09/03/2015 Rosaria Ferries, PA-C 04/05/2016      Chief Complaint  Patient presents with  . Follow-up    one month visit to dicuss cardioversion    History of Present Illness: [name] is a 76 y.o. 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 Rate control w/ Coreg & dig. Bradycardia w/ Cardizem. Plan DCCV in 4 weeks. TSH nl, EF 40-45% CHA2DS2VASc=6 (age x 2, HTN, DM, CAD, CHF) 04/05/2016 office visit, patient still in atrial fibrillation, follow-up office visit planned and then schedule cardioversion if he remains in A. fib. Eliquis compliance emphasized  CASHUS WAGY presents for cardiology follow up  He is having fatigue and some dizziness. Feels this must be from the atrial fib.   No chest pain, he is not as active as he would like to be. He can walk up stairs but gets tired doing it. He works around [employer], works in the yard when the weather is good. He recently had to use a wheelbarrow to take wood to the house, did not get CP with this.   His DOE has not changed recently. He has some ankle swelling chronically, no recent change.        Past Medical History:  Diagnosis Date  . Acute inferior myocardial infarction  (HCC)    hx  . Acute on chronic diastolic CHF (congestive heart failure), NYHA class 1 ([place]) 08/09/2014  . Anemia   . BPH (benign prostatic hyperplasia)   . CAD (coronary artery disease)   . Dyslipidemia   . History of blood transfusion    "I've had one; don't remember when"  . HTN (hypertension)   . Hyponatremia   . 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 Myocardial infarction x2  1986, 2005  . 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 PAD (peripheral artery disease) ([place])   . Type II diabetes mellitus ([place])          Past Surgical History:  Procedure Laterality Date  . CARDIAC CATHETERIZATION  12/23/2014   Procedure: Left Heart Cath and Cors/Grafts Angiography;  Surgeon: Peter M Martinique, MD;  Location: [facility];  Service: Cardiovascular;;  . CATARACT EXTRACTION W/ INTRAOCULAR LENS  IMPLANT, BILATERAL Bilateral   . CORONARY ANGIOPLASTY WITH STENT PLACEMENT    . CORONARY ARTERY BYPASS GRAFT  1986; redo 2005   ; free Rima to OM, svg-diag,svg-pda  . ENDARTERECTOMY FEMORAL Bilateral 12/31/2014   Procedure: BILATERAL FEMORAL ENDARTERECTOMY;  Surgeon: Elam Dutch, MD;  Location: [facility];  Service: Vascular;  Laterality: Bilateral;  . FEMORAL-FEMORAL BYPASS GRAFT Bilateral 12/31/2014   Procedure: FEMORAL-FEMORAL ARTERY BYPASS GRAFT;  Surgeon: Elam Dutch, MD;  Location: [facility];  Service: Vascular;  Laterality: Bilateral;  . KYPHOPLASTY  02/29/2012   Procedure: KYPHOPLASTY;  Surgeon: Sinclair Ship, MD;  Location: [facility];  Service: Orthopedics;  Laterality: Bilateral;  T 10 kyphoplasty  . LAPAROTOMY N/A 08/02/2014   Procedure: EXPLORATORY LAPAROTOMY LYSIS OF ADHESIONS;  Surgeon: Donnie Mesa, MD;  Location: [facility];  Service: General;  Laterality: N/A;  . MELANOMA EXCISION     scalp  . PERIPHERAL VASCULAR CATHETERIZATION N/A 12/05/2014   Procedure: Abdominal Aortogram;  Surgeon: Elam Dutch, MD;  Location: [facility];  Service: Cardiovascular;  Laterality: N/A;  . ROTATOR CUFF REPAIR Right   . TONSILLECTOMY          Medication Sig  . ALPRAZolam (XANAX) 0.5 MG tablet Take 0.5 mg by mouth 3 (three) times daily as needed for anxiety.   Marland Kitchen amoxicillin-clavulanate (AUGMENTIN) 875-125 MG tablet Take 1 tablet by mouth every 12 (twelve) hours.  Marland Kitchen apixaban (ELIQUIS) 5 MG TABS tablet Take 1 tablet (5 mg total) by mouth 2 (two) times daily.  Marland Kitchen aspirin 81 MG tablet Take 81 mg by mouth daily.    . carvedilol (COREG) 12.5 MG tablet TAKE 1 TABLET TWICE A DAY  . digoxin (LANOXIN) 0.125 MG tablet Take 1 tablet (0.125 mg total) by mouth daily.  Marland Kitchen glimepiride (AMARYL) 4 MG tablet Take 4 mg by mouth daily at 2 PM.   . guaifenesin (ROBITUSSIN) 100 MG/5ML syrup Take 200 mg by mouth 3 (three) times daily as needed for cough.  . iron polysaccharides (NIFEREX) 150 MG capsule Take 150 mg by mouth at bedtime.   . nitroGLYCERIN (NITROSTAT) 0.4 MG SL tablet PLACE 1 TABLET (0.4 MG TOTAL) UNDER THE TONGUE EVERY 5 (FIVE) MINUTES AS NEEDED FOR CHEST PAIN.  Marland Kitchen pioglitazone (ACTOS) 45 MG tablet Take 45 mg by mouth daily.   . simvastatin (ZOCOR) 20 MG tablet Take 1 tablet (20 mg total) by mouth at bedtime.  . Throat Lozenges (COUGH DROPS MT) Use as directed 1 lozenge in the mouth or throat daily as needed (cough).   No current facility-administered medications for this visit.     Allergies:   Hctz [hydrochlorothiazide]    Social History:  The patient  reports that he quit smoking about 30 years ago. His smoking use included Cigarettes. He has a 50.00 pack-year smoking history. He has never used smokeless tobacco. He reports that he drinks about 3.6 oz of alcohol per week . He reports that he does not use drugs.   Family History:  The patient's family history includes Dementia in his mother.    ROS:  Please see the history of present illness. All other systems are reviewed and negative.    PHYSICAL EXAM: VS:  BP  (!) 164/65   Pulse (!) 55   Ht 5\' 9"  (1.753 m)   Wt 172 lb (78 kg)   BMI 25.40 kg/m  , BMI Body mass index is 25.4 kg/m. GEN: Well nourished, well developed, male in no acute distress  HEENT: normal for age  Neck: no JVD, faint R carotid bruit, no masses Cardiac: Irreg R&R; soft murmur, no rubs, or gallops Respiratory:  clear to auscultation bilaterally, normal work of breathing GI: soft, nontender, nondistended, + BS MS: no deformity or atrophy; trace pedal edema; distal pulses are 2+ in all  4 extremities   Skin: warm and dry, no rash Neuro:  Strength and sensation are intact Psych: euthymic mood, full affect   EKG:  EKG is ordered today. The ekg ordered today demonstrates Atrial fib, HR 55. Diffuse T wave flattening, mildly different from 04/02/2016  ECHO: 04/02/2016 - Left ventricle: The cavity size was mildly dilated. Wall thickness was normal. Systolic function was mildly to moderately reduced. The estimated ejection fraction was in the range of 40% to 45%. There is akinesis of the inferolateral myocardium. - Mitral valve: Calcified annulus. There was mild regurgitation. - Aortic valve: Indexed valve area (VTI): 1.11 cm^2/m^2. Peak velocity ratio of LVOT to aortic valve: 0.71. Mean gradient (S): 5 mm Hg. Peak gradient (S): 9 mm Hg. - Left atrium: The atrium was moderately dilated. - Pulmonary arteries: Systolic pressure was moderately increased. PA peak pressure: 51 mm Hg (S). Impressions: - Akinesis of the inferolateral wall with overall mild to moderate LV dysfunction; mild MR; moderate LAE; mild TR; moderately elevated pulmonary pressure.  Dg Chest 2 View Result Date: 03/31/2016 CLINICAL DATA: Cough. EXAM: CHEST 2 VIEW COMPARISON: 07/26/2014 . FINDINGS: Mediastinum and hilar structures normal. Prior CABG. Cardiomegaly with mild pulmonary vascular prominence and interstitial prominence consistent with mild CHF. Previously identified infiltrate left  upper lobe has cleared. Mid thoracic vertebral body compression fracture. Prior vertebroplasty . IMPRESSION: 1. Prior CABG. Cardiomegaly with mild interstitial prominence consistent mild CHF. 2. Previously identified left upper lobe infiltrate noted on chest x-ray of 07/26/2014 has cleared. Changes of pleuroparenchymal scarring again noted.Electronically Signed By: Marcello Moores Register On: 03/31/2016 16:17   Recent Labs: 03/31/2016: B Natriuretic Peptide 225.4; TSH 1.009 04/03/2016: BUN 24; Creatinine, Ser 1.15; Hemoglobin 9.2; Platelets 191; Potassium 3.6; Sodium 136    Lipid Panel Labs (Brief)          Component Value Date/Time   CHOL 105 04/02/2016 0523   TRIG 54 04/02/2016 0523   HDL 51 04/02/2016 0523   CHOLHDL 2.1 04/02/2016 0523   VLDL 11 04/02/2016 0523   LDLCALC 43 04/02/2016 0523          Wt Readings from Last 3 Encounters:  04/29/16 172 lb (78 kg)  04/05/16 169 lb 12.8 oz (77 kg)  04/03/16 159 lb 11.2 oz (72.4 kg)     Other studies Reviewed: Additional studies/ records that were reviewed today include: office notes, hospital records and testing.  ASSESSMENT AND PLAN:  1.  Persistent atrial fibrillation: His heart rate is controlled, even a little slow, but he still symptomatic. I believe he would benefit from sinus rhythm. As he has been compliant with anticoagulation for greater than 3 weeks, no TEE will be needed.   We will schedule this for next week.   His rate is low right now, we will decrease his digoxin to one half tablet daily, may be able to discontinue it completely after the cardioversion.  2. Chronic anticoagulation: He has been compliant with the Eliquis and not missed any doses since his last office visit. Continue Eliquis, compliance was emphasized.  3. CAD: He is not having any ischemic symptoms. Continue low-dose aspirin, beta blocker, statin.  4. Diabetes: Take his diabetes medications after the cardioversion on that  day.   Current medicines are reviewed at length with the patient today.  The patient does not have concerns regarding medicines.  The following changes have been made:  Decrease digoxin  Labs/ tests ordered today include:      Orders Placed This Encounter  Procedures  .  ELECTRICAL CARDIOVERSION  . Basic metabolic panel  . CBC  . EKG 12-Lead     Disposition:   FU with Dr. Martinique  Signed, Rosaria Ferries, PA-C  04/29/2016 2:23 PM    [place] Phone: [phone]; Fax: [phone]  This note was written with the assistance of speech recognition software. Please excuse any transcriptional errors.     For DCCV; pt has been compliant with apixaban. Kirk Ruths, MD

## 2016-05-05 NOTE — Anesthesia Procedure Notes (Signed)
Date/Time: 05/05/2016 8:00 AM Performed by: Lance Coon Pre-anesthesia Checklist: Patient identified, Emergency Drugs available, Patient being monitored, Suction available and Timeout performed Patient Re-evaluated:Patient Re-evaluated prior to inductionOxygen Delivery Method: Ambu bag Preoxygenation: Pre-oxygenation with 100% oxygen Intubation Type: IV induction

## 2016-05-05 NOTE — Anesthesia Postprocedure Evaluation (Addendum)
Anesthesia Post Note  Patient: [name]  Procedure(s) Performed: Procedure(s) (LRB): CARDIOVERSION (N/A)  Patient location during evaluation: PACU Anesthesia Type: General Level of consciousness: awake and alert Pain management: pain level controlled Vital Signs Assessment: post-procedure vital signs reviewed and stable Respiratory status: spontaneous breathing, nonlabored ventilation, respiratory function stable and patient connected to nasal cannula oxygen Cardiovascular status: blood pressure returned to baseline and stable Postop Assessment: no signs of nausea or vomiting Anesthetic complications: no Comments: Medical condition and procedure does not require multiple anti-emetics.       Last Vitals:  Vitals:   05/05/16 0830 05/05/16 0835  BP: (!) 149/56 (!) 147/80  Pulse: (!) 58 (!) 57  Resp: (!) 22 18  Temp:      Last Pain:  Vitals:   05/05/16 0810  TempSrc: Leonard Schwartz

## 2016-05-05 NOTE — Transfer of Care (Signed)
Immediate Anesthesia Transfer of Care Note  Patient: [name]  Procedure(s) Performed: Procedure(s): CARDIOVERSION (N/A)  Patient Location: Endoscopy Unit  Anesthesia Type:MAC  Level of Consciousness: awake, alert , oriented and patient cooperative  Airway & Oxygen Therapy: Patient Spontanous Breathing  Post-op Assessment: Report given to RN and Post -op Vital signs reviewed and stable  Post vital signs: Reviewed and stable  Last Vitals:  Vitals:   05/05/16 0655  BP: (!) 168/46  Pulse: 65  Resp: 18  Temp: 36.5 C    Last Pain:  Vitals:   05/05/16 0655  TempSrc: Oral         Complications: No apparent anesthesia complications

## 2016-05-05 NOTE — Discharge Instructions (Signed)
Electrical Cardioversion, Care After °This sheet gives you information about how to care for yourself after your procedure. Your health care provider may also give you more specific instructions. If you have problems or questions, contact your health care provider. °What can I expect after the procedure? °After the procedure, it is common to have: °· Some redness on the skin where the shocks were given. °Follow these instructions at home: °· Do not drive for 24 hours if you were given a medicine to help you relax (sedative). °· Take over-the-counter and prescription medicines only as told by your health care provider. °· Ask your health care provider how to check your pulse. Check it often. °· Rest for 48 hours after the procedure or as told by your health care provider. °· Avoid or limit your caffeine use as told by your health care provider. °Contact a health care provider if: °· You feel like your heart is beating too quickly or your pulse is not regular. °· You have a serious muscle cramp that does not go away. °Get help right away if: °· You have discomfort in your chest. °· You are dizzy or you feel faint. °· You have trouble breathing or you are short of breath. °· Your speech is slurred. °· You have trouble moving an arm or leg on one side of your body. °· Your fingers or toes turn cold or blue. °This information is not intended to replace advice given to you by your health care provider. Make sure you discuss any questions you have with your health care provider. °Document Released: 01/02/2013 Document Revised: 10/16/2015 Document Reviewed: 09/18/2015 °Elsevier Interactive Patient Education © 2017 [employer] Inc[employer] ° °

## 2016-05-05 NOTE — Procedures (Signed)
Electrical Cardioversion Procedure Note [name] [id number] [birth year]  Procedure: Electrical Cardioversion Indications:  Atrial Flutter  Procedure Details Consent: Risks of procedure as well as the alternatives and risks of each were explained to the (patient/caregiver).  Consent for procedure obtained. Time Out: Verified patient identification, verified procedure, site/side was marked, verified correct patient position, special equipment/implants available, medications/allergies/relevent history reviewed, required imaging and test results available.  Performed  Patient placed on cardiac monitor, pulse oximetry, supplemental oxygen as necessary.  Sedation given: Pt sedated by anesthesia with lidocaine 20 mg and diprovan 50 mg IV. Pacer pads placed anterior and posterior chest.  Cardioverted 1 time(s).  Cardioverted at 120J.  Evaluation Findings: Post procedure EKG shows: NSR Complications: None Patient did tolerate procedure well.   Kirk Ruths 05/05/2016, 7:19 AM

## 2016-05-06 ENCOUNTER — Telehealth: Payer: Self-pay | Admitting: Cardiology

## 2016-05-06 ENCOUNTER — Encounter (HOSPITAL_COMMUNITY): Payer: Self-pay | Admitting: Cardiology

## 2016-05-06 DIAGNOSIS — Z79899 Other long term (current) drug therapy: Secondary | ICD-10-CM

## 2016-05-06 MED ORDER — LOSARTAN POTASSIUM 50 MG PO TABS: 50.0000 mg | ORAL_TABLET | Freq: Every day | ORAL | 3 refills | Status: DC

## 2016-05-06 NOTE — Telephone Encounter (Signed)
Spoke to [name].  He states his BPs have been up since leaving the hospital (04/03/16).  At the time, his losartan (100mg  daily) and his amlodipine (5mg  daily) were discontinued. Since then, he states his systolic numbers have consistently been 165-175 at home, elevated at his medical appointments as well. He states "no one seems to be concerned about this but me". Notes that his systolic used to run [id number]. "Shot up overnight" following med changes.  He states no symptoms other than occasional dizziness. He states this dizziness has been going on for months.  Informed him they may have been concerned about BP being overcorrected in hospital, discontinued meds out of caution. I let him know I would seek advice on this from provider.

## 2016-05-06 NOTE — Telephone Encounter (Signed)
New Message  Requesting call back  Pt c/o BP issue: STAT if pt c/o blurred vision, one-sided weakness or slurred speech  1. What are your last 5 BP readings? Systolic has been running between 165-175, didn't write down diastolic numbers  2. Are you having any other symptoms (ex. Dizziness, headache, blurred vision, passed out)? Dizziness  3. What is your BP issue? Per pt t was removed from 2 blood pressure medication, and now systolic BP is going up

## 2016-05-06 NOTE — Telephone Encounter (Signed)
Patient advised on recommendations, voiced understanding and thanks. Will resume losartan at 50mg  daily (has 100mg  losartan tabs and will take 1/2 until refill needed)  Will present to [facility] labs in 2 weeks for lab draw. Slips mailed to pt at his PO Box at patient request.

## 2016-05-06 NOTE — Telephone Encounter (Signed)
Losartan held in January during acute care admission due to increased SCR.    BMET repeat on 04/29/16 showed Scr back to baseline.  Recommendation to resume losartan at 50mg  daily and repeat BMET in 2 weeks.

## 2016-05-06 NOTE — Telephone Encounter (Signed)
Agree with pharmacy recommendation  Manpreet Strey Martinique MD, [facility]

## 2016-05-13 ENCOUNTER — Ambulatory Visit (INDEPENDENT_AMBULATORY_CARE_PROVIDER_SITE_OTHER): Payer: Medicare Other | Admitting: Urology

## 2016-05-13 ENCOUNTER — Other Ambulatory Visit (HOSPITAL_COMMUNITY)
Admission: AD | Admit: 2016-05-13 | Discharge: 2016-05-13 | Disposition: A | Discharge status: Home / Self Care | Payer: Medicare Other | Source: Other Acute Inpatient Hospital | Attending: Urology | Admitting: Urology

## 2016-05-13 DIAGNOSIS — N390 Urinary tract infection, site not specified: Secondary | ICD-10-CM

## 2016-05-13 DIAGNOSIS — N401 Enlarged prostate with lower urinary tract symptoms: Secondary | ICD-10-CM | POA: Diagnosis not present

## 2016-05-13 DIAGNOSIS — R351 Nocturia: Secondary | ICD-10-CM

## 2016-05-13 LAB — URINALYSIS, COMPLETE (UACMP) WITH MICROSCOPIC
BILIRUBIN URINE: NEGATIVE
GLUCOSE, UA: NEGATIVE mg/dL
KETONES UR: NEGATIVE mg/dL
NITRITE: NEGATIVE
PH: 6 (ref 5.0–8.0)
PROTEIN: 30 mg/dL — AB
Specific Gravity, Urine: 1.013 (ref 1.005–1.030)

## 2016-05-16 LAB — URINE CULTURE: Culture: >=100000 — AB

## 2016-05-20 ENCOUNTER — Encounter: Payer: Self-pay | Admitting: Cardiology

## 2016-05-20 LAB — BASIC METABOLIC PANEL
BUN: 18 mg/dL (ref 7–25)
CHLORIDE: 104 mmol/L (ref 98–110)
CO2: 26 mmol/L (ref 20–31)
Calcium: 9.4 mg/dL (ref 8.6–10.3)
Creat: 1.19 mg/dL — ABNORMAL HIGH (ref 0.70–1.18)
Glucose, Bld: 121 mg/dL — ABNORMAL HIGH (ref 65–99)
POTASSIUM: 4.1 mmol/L (ref 3.5–5.3)
SODIUM: 140 mmol/L (ref 135–146)

## 2016-05-28 NOTE — Progress Notes (Signed)
[name] Date of Birth: Dec 07, 1940 Medical Record [id number]  History of Present Illness: [name] is seen for follow up Afib.  He has a history of coronary disease and is status post redo coronary bypass surgery in 2005 after emergent stenting of the left main. This included a free RIMA graft to the OM, SVG to diagonal, and SVG to PDA. LIMA to LAD was intact from original surgery. He has had an old anterior myocardial infarction.  In April 2016 he was admitted with SBO and underwent surgery with exploratory lap and lysis of adhesions. His post op course was complicated by marked volume overload due to IVF with over 20 lb weight gain. He was diuresed. Echo showed EF 45%.  In August 2016 he presented with progressive claudication in the left hip and leg. Dopplers showed severe PAD. Angiography was done by Dr. Oneida Alar and showed severe left iliac and bilateral common femoral disease. He had an abnormal Myoview study and cardiac cath was done and showed patent grafts.  He underwent surgery by Dr. Oneida Alar with bilateral femoral endarterectomy and fem- fem BPG.  Admit 01/04-01/07/18 w/ URI, CHF, new dx atrial fib. Rate control w/ Coreg & dig. Bradycardia w/ Cardizem. Started on Eliquis and Plavix discontinued. TSH nl, EF 40-45% CHA2DS2VASc=6 (age x 2, HTN, DM, CAD, CHF). He was anticoagulated for 4 weeks and underwent DCCV on 05/05/16. Losartan was held for elevated creatinine. This later returned to normal and losartan resumed at 50 mg daily.   On follow up today he is doing very well.   No chest pain or dyspnea.  No significant claudication.  Edema resolved. Has some chronic hip pain. Mild intermittent lightheadedness.   Current Outpatient Prescriptions on File Prior to Visit  Medication Sig Dispense Refill  . apixaban (ELIQUIS) 5 MG TABS tablet Take 1 tablet (5 mg total) by mouth 2 (two) times daily. 60 tablet 0  . carvedilol (COREG) 12.5 MG tablet TAKE 1 TABLET TWICE A DAY 180 tablet 2  .  digoxin (LANOXIN) 0.125 MG tablet Take 0.0625 mg by mouth daily.    Marland Kitchen glimepiride (AMARYL) 4 MG tablet Take 4 mg by mouth daily at 2 PM.     . iron polysaccharides (NIFEREX) 150 MG capsule Take 150 mg by mouth at bedtime.     Marland Kitchen losartan (COZAAR) 50 MG tablet Take 1 tablet (50 mg total) by mouth daily. 90 tablet 3  . nitroGLYCERIN (NITROSTAT) 0.4 MG SL tablet PLACE 1 TABLET (0.4 MG TOTAL) UNDER THE TONGUE EVERY 5 (FIVE) MINUTES AS NEEDED FOR CHEST PAIN. 25 tablet 2  . pioglitazone (ACTOS) 45 MG tablet Take 45 mg by mouth daily.     . simvastatin (ZOCOR) 20 MG tablet Take 1 tablet (20 mg total) by mouth at bedtime. 90 tablet 3  . aspirin 81 MG tablet Take 81 mg by mouth daily.       No current facility-administered medications on file prior to visit.     Allergies  Allergen Reactions  . Hctz [Hydrochlorothiazide] Other (See Comments)    Hypokalemia     Past Medical History:  Diagnosis Date  . Acute inferior myocardial infarction (HCC)    hx  . Acute on chronic diastolic CHF (congestive heart failure), NYHA class 1 ([place]) 08/09/2014  . Anemia   . BPH (benign prostatic hyperplasia)   . CAD (coronary artery disease)   . Dyslipidemia   . History of blood transfusion    "I've had one; don't remember when"  .  HTN (hypertension)   . Hyponatremia   . 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 Myocardial infarction x2  1986, 2005  . 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 PAD (peripheral artery disease) ([place])   . Type II diabetes mellitus ([place])     Past Surgical History:  Procedure Laterality Date  . CARDIAC CATHETERIZATION  12/23/2014   Procedure: Left Heart Cath and Cors/Grafts Angiography;  Surgeon: Peter M Martinique, MD;  Location: [place] [facility];  Service: Cardiovascular;;  . CARDIOVERSION N/A 05/05/2016   Procedure: CARDIOVERSION;  Surgeon: Lelon Perla, MD;  Location: [facility] ENDOSCOPY;  Service: Cardiovascular;  Laterality: N/A;  . CATARACT EXTRACTION W/  INTRAOCULAR LENS  IMPLANT, BILATERAL Bilateral   . CORONARY ANGIOPLASTY WITH STENT PLACEMENT    . CORONARY ARTERY BYPASS GRAFT  1986; redo 2005   ; free Rima to OM, svg-diag,svg-pda  . ENDARTERECTOMY FEMORAL Bilateral 12/31/2014   Procedure: BILATERAL FEMORAL ENDARTERECTOMY;  Surgeon: Elam Dutch, MD;  Location: [place];  Service: Vascular;  Laterality: Bilateral;  . FEMORAL-FEMORAL BYPASS GRAFT Bilateral 12/31/2014   Procedure: FEMORAL-FEMORAL ARTERY BYPASS GRAFT;  Surgeon: Elam Dutch, MD;  Location: [place];  Service: Vascular;  Laterality: Bilateral;  . KYPHOPLASTY  02/29/2012   Procedure: KYPHOPLASTY;  Surgeon: Sinclair Ship, MD;  Location: [place];  Service: Orthopedics;  Laterality: Bilateral;  T 10 kyphoplasty  . LAPAROTOMY N/A 08/02/2014   Procedure: EXPLORATORY LAPAROTOMY LYSIS OF ADHESIONS;  Surgeon: Donnie Mesa, MD;  Location: [place];  Service: General;  Laterality: N/A;  . MELANOMA EXCISION     scalp  . PERIPHERAL VASCULAR CATHETERIZATION N/A 12/05/2014   Procedure: Abdominal Aortogram;  Surgeon: Elam Dutch, MD;  Location: [place] CV LAB;  Service: Cardiovascular;  Laterality: N/A;  . ROTATOR CUFF REPAIR Right   . TONSILLECTOMY      History  Smoking Status  . Former Smoker  . Packs/day: 2.00  . Years: 25.00  . Types: Cigarettes  . Quit date: 02/22/1986  Smokeless Tobacco  . Never Used    History  Alcohol Use  . 3.6 oz/week  . 6 Cans of beer per week    Family History  Problem Relation Age of Onset  . Dementia Mother     Review of Systems: As noted in history of present illness.  All other systems were reviewed and are negative.  Physical Exam: BP 102/68   Pulse 60   Ht 5\' 9"  (1.753 m)   Wt 169 lb (76.7 kg)   BMI 24.96 kg/m  He is a pleasant white male in no acute distress.The patient is alert and oriented x 3.    The skin is warm and dry.  Color is normal.  The HEENT exam is unremarkable. Sclera are clear. PERRLA. The mucous membranes  are moist.  The carotids are 2+ without bruits.  There is no thyromegaly.  There is no JVD.  The lungs are clear.   The heart exam reveals a regular rate with a normal S1 and S2.  There is a grade [id number] systolic ejection murmur the left sternal border.  The PMI is not displaced.   Abdominal exam is nontender. Bowel sounds are positive. There is no hepatosplenomegaly or tenderness.  There are no masses.  Exam of the legs reveal no edema.  Feet are warm. Cranial nerves II - XII are intact.  Motor and sensory functions are intact.  The gait is normal.  LABORATORY DATA:  Lab Results  Component Value Date   WBC 4.3 04/29/2016   HGB 10.6 (L) 04/29/2016   HCT 31.8 (L) 04/29/2016   PLT 144 04/29/2016   GLUCOSE 121 (H) 05/19/2016   CHOL 105 04/02/2016   TRIG 54 04/02/2016   HDL 51 04/02/2016   LDLCALC 43 04/02/2016   ALT 15 (L) 12/25/2014   AST 24 12/25/2014   NA 140 05/19/2016   K 4.1 05/19/2016   CL 104 05/19/2016   CREATININE 1.19 (H) 05/19/2016   BUN 18 05/19/2016   CO2 26 05/19/2016   TSH 1.009 03/31/2016   INR 1.41 12/25/2014   HGBA1C 6.5 (H) 04/01/2016   ECHO: 04/02/2016 - Left ventricle: The cavity size was mildly dilated. Wall thickness was normal. Systolic function was mildly to moderately reduced. The estimated ejection fraction was in the range of 40% to 45%. There is akinesis of the inferolateral myocardium. - Mitral valve: Calcified annulus. There was mild regurgitation. - Aortic valve: Indexed valve area (VTI): 1.11 cm^2/m^2. Peak velocity ratio of LVOT to aortic valve: 0.71. Mean gradient (S): 5 mm Hg. Peak gradient (S): 9 mm Hg. - Left atrium: The atrium was moderately dilated. - Pulmonary arteries: Systolic pressure was moderately increased. PA peak pressure: 51 mm Hg (S). Impressions: - Akinesis of the inferolateral wall with overall mild to moderate LV dysfunction; mild MR; moderate LAE; mild TR; moderately elevated pulmonary pressure.  Ecg today  shows NSR rate 60, first degree AV block. NST-T changes c/w dig effect. I have personally reviewed and interpreted this study.  Assessment / Plan: 1. Coronary disease status post redo CABG in 2005. Remote anterior myocardial infarction. Prior left main stent. Cardiac cath in September 2016 showed patent grafts. Continue medical therapy. Given stent in left main I would continue ASA 81 mg daily even though on eliquis.   2. Chronic diastolic CHF secondary to volume resuscitation with SBO in April 2016. Resolved. Appears euvolemic today.  3. Atrial fibrillation s/p DCCV on 05/05/16. Maintaining NSR. It appears Afib triggered by respiratory illness. [place] Vasc score of 6. Will continue Eliquis long term.  4. Hyperlipidemia on Zocor.  5. Severe PAD- S/p bilateral femoral endarterectomy and fem-fem bypass. Stressed importance of regular aerobic walking.   6. DM type 2  7. HTN

## 2016-05-30 ENCOUNTER — Ambulatory Visit (INDEPENDENT_AMBULATORY_CARE_PROVIDER_SITE_OTHER): Payer: Medicare Other | Admitting: Cardiology

## 2016-05-30 ENCOUNTER — Encounter: Payer: Self-pay | Admitting: Cardiology

## 2016-05-30 VITALS — BP 102/68 | HR 60 | Ht 69.0 in | Wt 169.0 lb

## 2016-05-30 DIAGNOSIS — Z7901 Long term (current) use of anticoagulants: Secondary | ICD-10-CM | POA: Diagnosis not present

## 2016-05-30 DIAGNOSIS — I481 Persistent atrial fibrillation: Secondary | ICD-10-CM | POA: Diagnosis not present

## 2016-05-30 DIAGNOSIS — I5022 Chronic systolic (congestive) heart failure: Secondary | ICD-10-CM

## 2016-05-30 DIAGNOSIS — I739 Peripheral vascular disease, unspecified: Secondary | ICD-10-CM | POA: Diagnosis not present

## 2016-05-30 DIAGNOSIS — Z951 Presence of aortocoronary bypass graft: Secondary | ICD-10-CM | POA: Diagnosis not present

## 2016-05-30 DIAGNOSIS — I4819 Other persistent atrial fibrillation: Secondary | ICD-10-CM

## 2016-05-30 DIAGNOSIS — I25810 Atherosclerosis of coronary artery bypass graft(s) without angina pectoris: Secondary | ICD-10-CM | POA: Diagnosis not present

## 2016-05-30 NOTE — Patient Instructions (Signed)
Continue your current therapy  I will see you in 6 months.   

## 2016-06-20 ENCOUNTER — Telehealth: Payer: Self-pay | Admitting: Cardiology

## 2016-06-20 NOTE — Telephone Encounter (Signed)
Would recommend that he stay on meds.  Bleeding probably due to excessive coughing and nose blowing.  Use nasal saline 2-3 times daily to help with nose and stay on Robitussin and cough lozenges.   If he is worried about the bleeding I would rather he skip a dose or two of the ASA but not the Eliquis

## 2016-06-20 NOTE — Telephone Encounter (Signed)
Pt agreeable to advice given, aware I will call back w further recommendations if Dr. Martinique advises. Pt voiced thanks for call.

## 2016-06-20 NOTE — Telephone Encounter (Signed)
New Message   Pt c/o medication issue:  1. Name of Medication:   apixaban (ELIQUIS) 5 MG TABS tablet   2. How are you currently taking this medication (dosage and times per day)? 5mg /Twice a day  3. Are you having a reaction (difficulty breathing--STAT)? No  4. What is your medication issue? Per pt thinks medication may be causing bleeding from nostril, and coughing up blood  Per pt took BP last night & they were 166/81, 176/78

## 2016-06-20 NOTE — Telephone Encounter (Signed)
Spoke to patient, who notes he was recently seen by family medicine for cough. He's on eliquis and ASA for A Fib and stent protection.  Notes cough has been very bad, throat irritated, he had blood on pillow from nostril when he woke Sunday morning and coughed up bloody phlegm. This went on intermittently through Sunday afternoon. He held his AM dose of Eliquis yesterday. "I know I'm not supposed to do that". Denies other missed doses.  Notes HR is OK, 66-72 when checked. Systolic BP is noteably high at 164-174 when patient has checked this. He thinks this is due to all the coughing. Informs me he took bourbon w/ honey, and robotussin for relief of cough.  Recommended patient continue current therapy (Eliquis) w/o interruption. Aware BP could be up from DM meds.  Will seek advice for other meds for symptom relief.  Pt aware I will defer for further recommendations and return call later today or tomorrow. He voiced OK to leave detailed msg on machine if he is not at home.

## 2016-06-20 NOTE — Telephone Encounter (Signed)
Agree with advice given.  Peter Martinique MD, [facility]

## 2016-07-26 ENCOUNTER — Encounter: Payer: Self-pay | Admitting: Family

## 2016-08-04 ENCOUNTER — Ambulatory Visit (HOSPITAL_COMMUNITY)
Admission: RE | Admit: 2016-08-04 | Discharge: 2016-08-04 | Disposition: A | Discharge status: Home / Self Care | Payer: Medicare Other | Source: Ambulatory Visit | Attending: Family | Admitting: Family

## 2016-08-04 ENCOUNTER — Ambulatory Visit (INDEPENDENT_AMBULATORY_CARE_PROVIDER_SITE_OTHER)
Admission: RE | Admit: 2016-08-04 | Discharge: 2016-08-04 | Disposition: A | Discharge status: Home / Self Care | Payer: Medicare Other | Source: Ambulatory Visit | Attending: Family | Admitting: Family

## 2016-08-04 ENCOUNTER — Encounter: Payer: Self-pay | Admitting: Family

## 2016-08-04 ENCOUNTER — Ambulatory Visit (INDEPENDENT_AMBULATORY_CARE_PROVIDER_SITE_OTHER): Payer: Medicare Other | Admitting: Family

## 2016-08-04 VITALS — BP 178/65 | HR 59 | Temp 97.1°F | Resp 18 | Ht 69.0 in | Wt 171.0 lb

## 2016-08-04 DIAGNOSIS — I779 Disorder of arteries and arterioles, unspecified: Secondary | ICD-10-CM | POA: Diagnosis not present

## 2016-08-04 DIAGNOSIS — Z95828 Presence of other vascular implants and grafts: Secondary | ICD-10-CM

## 2016-08-04 DIAGNOSIS — Z87891 Personal history of nicotine dependence: Secondary | ICD-10-CM

## 2016-08-04 DIAGNOSIS — R0989 Other specified symptoms and signs involving the circulatory and respiratory systems: Secondary | ICD-10-CM

## 2016-08-04 LAB — VAS US CAROTID
LCCADDIAS: -18 cm/s
LCCAPDIAS: 16 cm/s
LCCAPSYS: 122 cm/s
LEFT ECA DIAS: -6 cm/s
LEFT VERTEBRAL DIAS: 13 cm/s
LICADDIAS: -25 cm/s
LICAPSYS: -84 cm/s
Left CCA dist sys: -94 cm/s
Left ICA dist sys: -111 cm/s
Left ICA prox dias: -15 cm/s
RIGHT CCA MID DIAS: 15 cm/s
RIGHT ECA DIAS: -16 cm/s
RIGHT VERTEBRAL DIAS: -9 cm/s
Right CCA prox dias: 13 cm/s
Right CCA prox sys: 111 cm/s
Right cca dist sys: -100 cm/s

## 2016-08-04 NOTE — Progress Notes (Signed)
[facility] [place]   CC: Follow up peripheral artery occlusive disease  History of Present Illness [name] is a 76 y.o. male patient of Dr. Oneida Alar with a history of left LE claudication.  He is s/p femoral-femoral bypass with bilateral femoral endarterectomy October 2016.  He states his left leg is not as strong as his right since his first CABG. He had veins harvested from his left leg for his CABG in 1987, again in 2005 (vein harvested from right leg), stent in the left main coronary artery.   Pt states he has no barriers to walking, but states he has not been walking as much as he used to.   Pt states he forgot to take all of his medications today including his blood pressure medications, states he will take when he gets home; blood pressure is elevated now.   He denies pain in his calves or thighs with walking, denies non healing wounds.  He denies any history of stroke or TIA.   Pt Diabetic: Yes, last A1C result on file is 6.5 on 04-01-16 (review of records) Pt smoker: former smoker, quit in 1987, started at age 36.  Pt meds include: Statin :Yes Betablocker: Yes ASA: Yes Other anticoagulants/antiplatelets: Eliquis, new onset atrial fib in January 2018; pt states this started after several days of severe coughing; cardioverted to NSR.    Past Medical History:  Diagnosis Date  . Acute inferior myocardial infarction (HCC)    hx  . Acute on chronic diastolic CHF (congestive heart failure), NYHA class 1 ([place]) 08/09/2014  . Anemia   . BPH (benign prostatic hyperplasia)   . CAD (coronary artery disease)   . Dyslipidemia   . History of blood transfusion    "I've had one; don't remember when"  . HTN (hypertension)   . Hyponatremia   . 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 Myocardial infarction [facility]) x2  1986, 2005  . 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 PAD (peripheral artery disease) ([place])   . Type II diabetes mellitus  ([place])     Social History Social History  Substance Use Topics  . Smoking status: Former Smoker    Packs/day: 2.00    Years: 25.00    Types: Cigarettes    Quit date: 02/22/1986  . Smokeless tobacco: Never Used  . Alcohol use 3.6 oz/week    6 Cans of beer per week    Family History Family History  Problem Relation Age of Onset  . Dementia Mother     Past Surgical History:  Procedure Laterality Date  . CARDIAC CATHETERIZATION  12/23/2014   Procedure: Left Heart Cath and Cors/Grafts Angiography;  Surgeon: Peter M Martinique, MD;  Location: [place] [facility];  Service: Cardiovascular;;  . CARDIOVERSION N/A 05/05/2016   Procedure: CARDIOVERSION;  Surgeon: Lelon Perla, MD;  Location: [facility] ENDOSCOPY;  Service: Cardiovascular;  Laterality: N/A;  . CATARACT EXTRACTION W/ INTRAOCULAR LENS  IMPLANT, BILATERAL Bilateral   . CORONARY ANGIOPLASTY WITH STENT PLACEMENT    . CORONARY ARTERY BYPASS GRAFT  1986; redo 2005   ; free Rima to OM, svg-diag,svg-pda  . ENDARTERECTOMY FEMORAL Bilateral 12/31/2014   Procedure: BILATERAL FEMORAL ENDARTERECTOMY;  Surgeon: Elam Dutch, MD;  Location: [place];  Service: Vascular;  Laterality: Bilateral;  . FEMORAL-FEMORAL BYPASS GRAFT Bilateral 12/31/2014   Procedure: FEMORAL-FEMORAL ARTERY BYPASS GRAFT;  Surgeon: Elam Dutch, MD;  Location: [place];  Service: Vascular;  Laterality: Bilateral;  . KYPHOPLASTY  02/29/2012   Procedure: KYPHOPLASTY;  Surgeon: Sinclair Ship, MD;  Location: [place];  Service: Orthopedics;  Laterality: Bilateral;  T 10 kyphoplasty  . LAPAROTOMY N/A 08/02/2014   Procedure: EXPLORATORY LAPAROTOMY LYSIS OF ADHESIONS;  Surgeon: Donnie Mesa, MD;  Location: [place];  Service: General;  Laterality: N/A;  . MELANOMA EXCISION     scalp  . PERIPHERAL VASCULAR CATHETERIZATION N/A 12/05/2014   Procedure: Abdominal Aortogram;  Surgeon: Elam Dutch, MD;  Location: [place] [facility];  Service: Cardiovascular;  Laterality: N/A;  .  ROTATOR CUFF REPAIR Right   . TONSILLECTOMY      Allergies  Allergen Reactions  . Hctz [Hydrochlorothiazide] Other (See Comments)    Hypokalemia     Current Outpatient Prescriptions  Medication Sig Dispense Refill  . apixaban (ELIQUIS) 5 MG TABS tablet Take 1 tablet (5 mg total) by mouth 2 (two) times daily. 60 tablet 0  . aspirin 81 MG tablet Take 81 mg by mouth daily.      . carvedilol (COREG) 12.5 MG tablet TAKE 1 TABLET TWICE A DAY 180 tablet 2  . digoxin (LANOXIN) 0.125 MG tablet Take 0.0625 mg by mouth daily.    Marland Kitchen glimepiride (AMARYL) 4 MG tablet Take 4 mg by mouth daily at 2 PM.     . iron polysaccharides (NIFEREX) 150 MG capsule Take 150 mg by mouth at bedtime.     Marland Kitchen losartan (COZAAR) 50 MG tablet Take 1 tablet (50 mg total) by mouth daily. 90 tablet 3  . nitroGLYCERIN (NITROSTAT) 0.4 MG SL tablet PLACE 1 TABLET (0.4 MG TOTAL) UNDER THE TONGUE EVERY 5 (FIVE) MINUTES AS NEEDED FOR CHEST PAIN. 25 tablet 2  . pioglitazone (ACTOS) 45 MG tablet Take 45 mg by mouth daily.     . simvastatin (ZOCOR) 20 MG tablet Take 1 tablet (20 mg total) by mouth at bedtime. 90 tablet 3   No current facility-administered medications for this visit.     ROS: See HPI for pertinent positives and negatives.   Physical Examination  Vitals:   08/04/16 1411 08/04/16 1415  BP: (!) 158/62 (!) 178/65  Pulse: (!) 59   Resp: 18   Temp: 97.1 F (36.2 C)   TempSrc: Oral   SpO2: 98%   Weight: 171 lb (77.6 kg)   Height: 5\' 9"  (1.753 m)    Body mass index is 25.25 kg/m.  General: A&O x 3, WDWN. Gait: normal Eyes: PERRLA. Pulmonary: Respirations are non labored, CTAB, good air movement Cardiac: regular rhythm, no detected murmur.         Carotid Bruits Right Left   Positive Negative  Aorta is not palpable. Radial pulses: 2+ palpable and = Fem-fem bypass graft pulse is 3+palpable.                           VASCULAR EXAM: Extremities without ischemic changes, without Gangrene; without  open wounds.  LE Pulses Right Left       FEMORAL  3+ palpable  2+ palpable        POPLITEAL  not palpable   not palpable       POSTERIOR TIBIAL  not palpable   not palpable        DORSALIS PEDIS      ANTERIOR TIBIAL faintly palpable  not palpable    Abdomen: soft, NT, no palpable masses. Skin: no rashes, no ulcers noted. Musculoskeletal: no muscle wasting or atrophy.         Neurologic: A&O X 3; Appropriate Affect ; SENSATION: normal; MOTOR FUNCTION:  moving all extremities equally, motor strength 5/5 throughout. Speech is fluent/normal. CN 2-12 intact.    ASSESSMENT: [name] is a 76 y.o. male who is s/p femoral-femoral bypass with bilateral femoral endarterectomy October 2016 for a history of left LE intermittent claudication. He has no claudication since the surgery. There are no signs of ischemia in his feet/legs. Fem-fem bypass graft pulse is 3+ palpable.   He has a right carotid bruit, has no history of stroke or TIA.  DATA (08/04/16):  ABI: Right: [place] ([place],01-21-16), waveforms: biphasic; TBI: 0.48 (was 0.86 on 01-21-16, prior to that was 0.20). Left: [place] ([place], 01-21-16), waveforms: PT: monophasic, DP: biphasic; TBI: 0.40 (was 0.62, prior to that was 0.37)   Carotid Duplex: <40% bilateral ICA stenosis. Triphasic right subclavian artery waveforms, biphasic left subclavian artery waveforms. Bilateral vertebral artery flow is antegrade.  No prior exam for comparison.     PLAN:  Graduated walking program discussed and how to achieve.  Based on the patient's vascular studies and examination, pt will return to clinic in 9 months with ABI's, 2 years with carotid duplex.  I advised pt to notify us if he develops concerns re the circulation in his feet or legs.   I discussed in depth with the patient the nature  of atherosclerosis, and emphasized the importance of maximal medical management including strict control of blood pressure, blood glucose, and lipid levels, obtaining regular exercise, and continued cessation of smoking.  The patient is aware that without maximal medical management the underlying atherosclerotic disease process will progress, limiting the benefit of any interventions.  The patient was given information about PAD including signs, symptoms, treatment, what symptoms should prompt the patient to seek immediate medical care, and risk reduction measures to take.  Clemon Chambers, RN, MSN, FNP-C [facility] Phone: [phone]  Clinic MD: Oneida Alar  08/04/16 2:18 PM

## 2016-08-04 NOTE — Patient Instructions (Signed)

## 2016-08-12 NOTE — Addendum Note (Signed)
Addended by: Lianne Cure A on: 08/12/2016 09:34 AM   Modules accepted: Orders

## 2016-08-19 ENCOUNTER — Ambulatory Visit (INDEPENDENT_AMBULATORY_CARE_PROVIDER_SITE_OTHER): Payer: Medicare Other | Admitting: Urology

## 2016-08-19 ENCOUNTER — Encounter: Payer: Self-pay | Admitting: Cardiology

## 2016-08-19 DIAGNOSIS — N3020 Other chronic cystitis without hematuria: Secondary | ICD-10-CM | POA: Diagnosis not present

## 2016-08-19 DIAGNOSIS — R351 Nocturia: Secondary | ICD-10-CM

## 2016-08-19 DIAGNOSIS — N401 Enlarged prostate with lower urinary tract symptoms: Secondary | ICD-10-CM

## 2016-08-26 ENCOUNTER — Ambulatory Visit (INDEPENDENT_AMBULATORY_CARE_PROVIDER_SITE_OTHER): Payer: Medicare Other | Admitting: Urology

## 2016-08-26 ENCOUNTER — Other Ambulatory Visit (HOSPITAL_COMMUNITY)
Admission: RE | Admit: 2016-08-26 | Discharge: 2016-08-26 | Disposition: A | Discharge status: Home / Self Care | Payer: Medicare Other | Source: Other Acute Inpatient Hospital | Attending: Urology | Admitting: Urology

## 2016-08-26 DIAGNOSIS — N3020 Other chronic cystitis without hematuria: Secondary | ICD-10-CM | POA: Insufficient documentation

## 2016-08-26 LAB — URINALYSIS, COMPLETE (UACMP) WITH MICROSCOPIC
BILIRUBIN URINE: NEGATIVE
Bacteria, UA: NONE SEEN
Glucose, UA: NEGATIVE mg/dL
HGB URINE DIPSTICK: NEGATIVE
Ketones, ur: NEGATIVE mg/dL
Leukocytes, UA: NEGATIVE
NITRITE: NEGATIVE
PROTEIN: NEGATIVE mg/dL
SPECIFIC GRAVITY, URINE: 1.012 (ref 1.005–1.030)
Squamous Epithelial / LPF: NONE SEEN
pH: 6 (ref 5.0–8.0)

## 2016-08-28 LAB — URINE CULTURE

## 2016-09-16 IMAGING — CR DG ABD PORTABLE 1V
1 series · 1 of 1 positions shown · non-contrast
Comparison: CT of earlier in the day

CLINICAL DATA: Abdominal pain.  Nausea and vomiting for 1 day.

EXAM:
PORTABLE ABDOMEN - 1 VIEW

[AP]
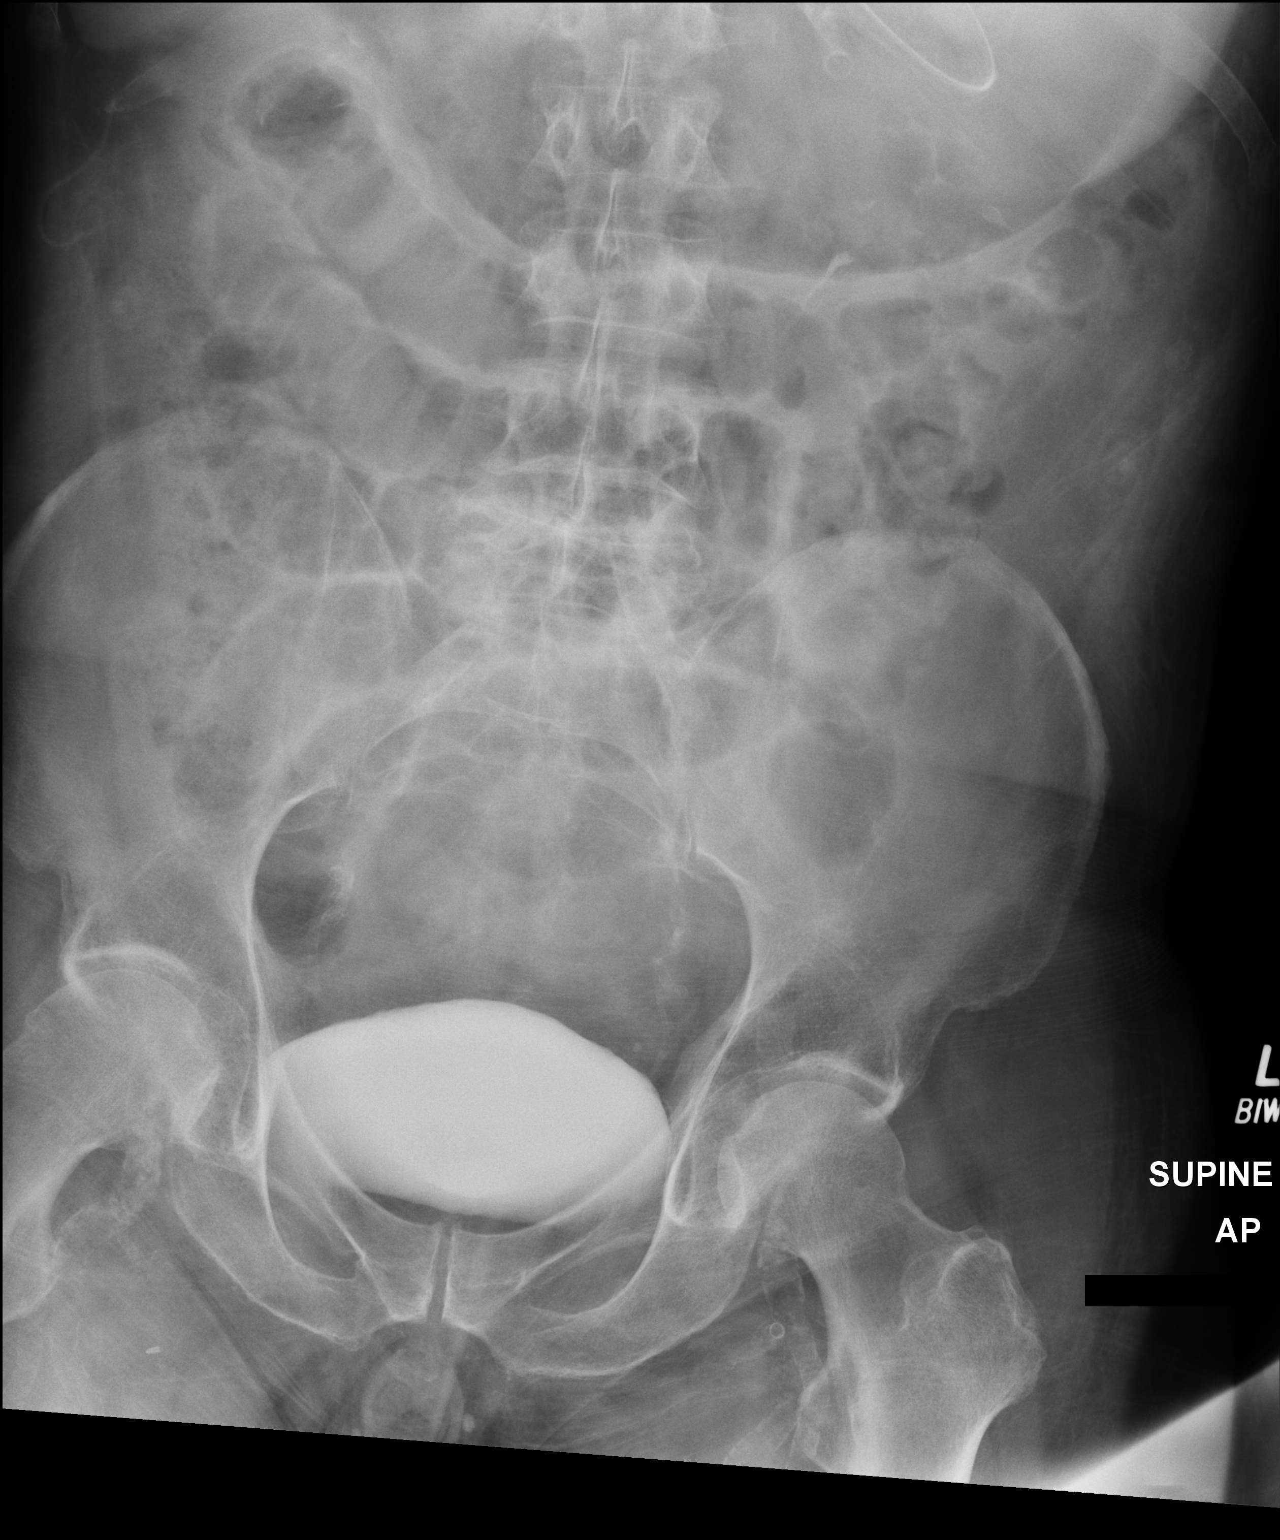

[1 of 1 positions shown; findings below may reference images not displayed]

FINDINGS: Single supine portable view. Contrast within the urinary bladder.
Gastric distension with possible nasogastric tube, incompletely
imaged. Proximal small bowel loops measure up to 3.2 cm. Gas and
stool within the colon. Advanced atherosclerosis.
IMPRESSION: Partial small bowel obstruction, without free intraperitoneal air or
other acute complication.

## 2016-09-23 IMAGING — CR DG ABDOMEN 2V
2 series · 2 of 2 positions shown · non-contrast
Comparison: Supine and upright abdominal films July 30, 2014

CLINICAL DATA: Follow-up of small bowel obstruction.

EXAM:
ABDOMEN - 2 VIEW

[w abdomen upright]
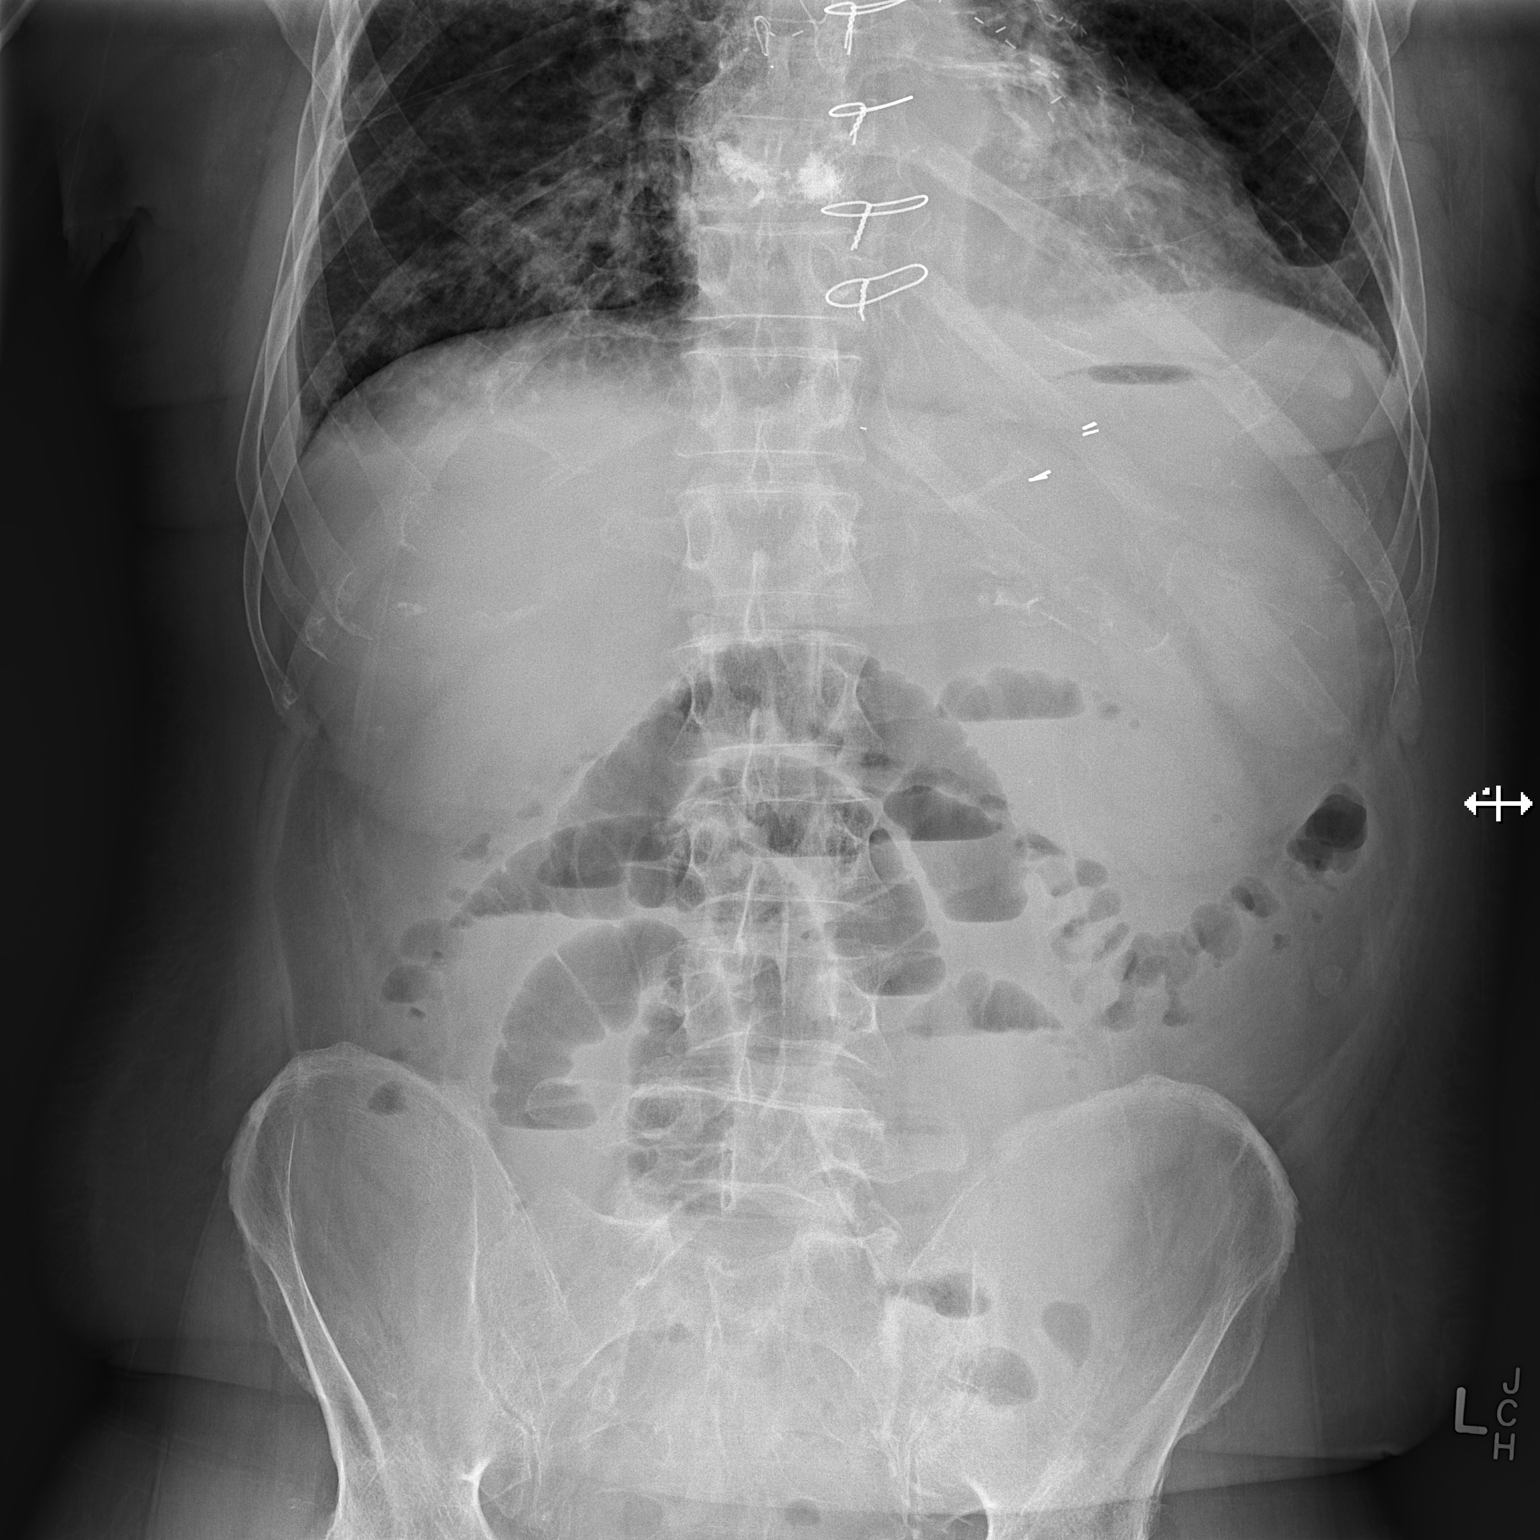

[t abdomen supine]
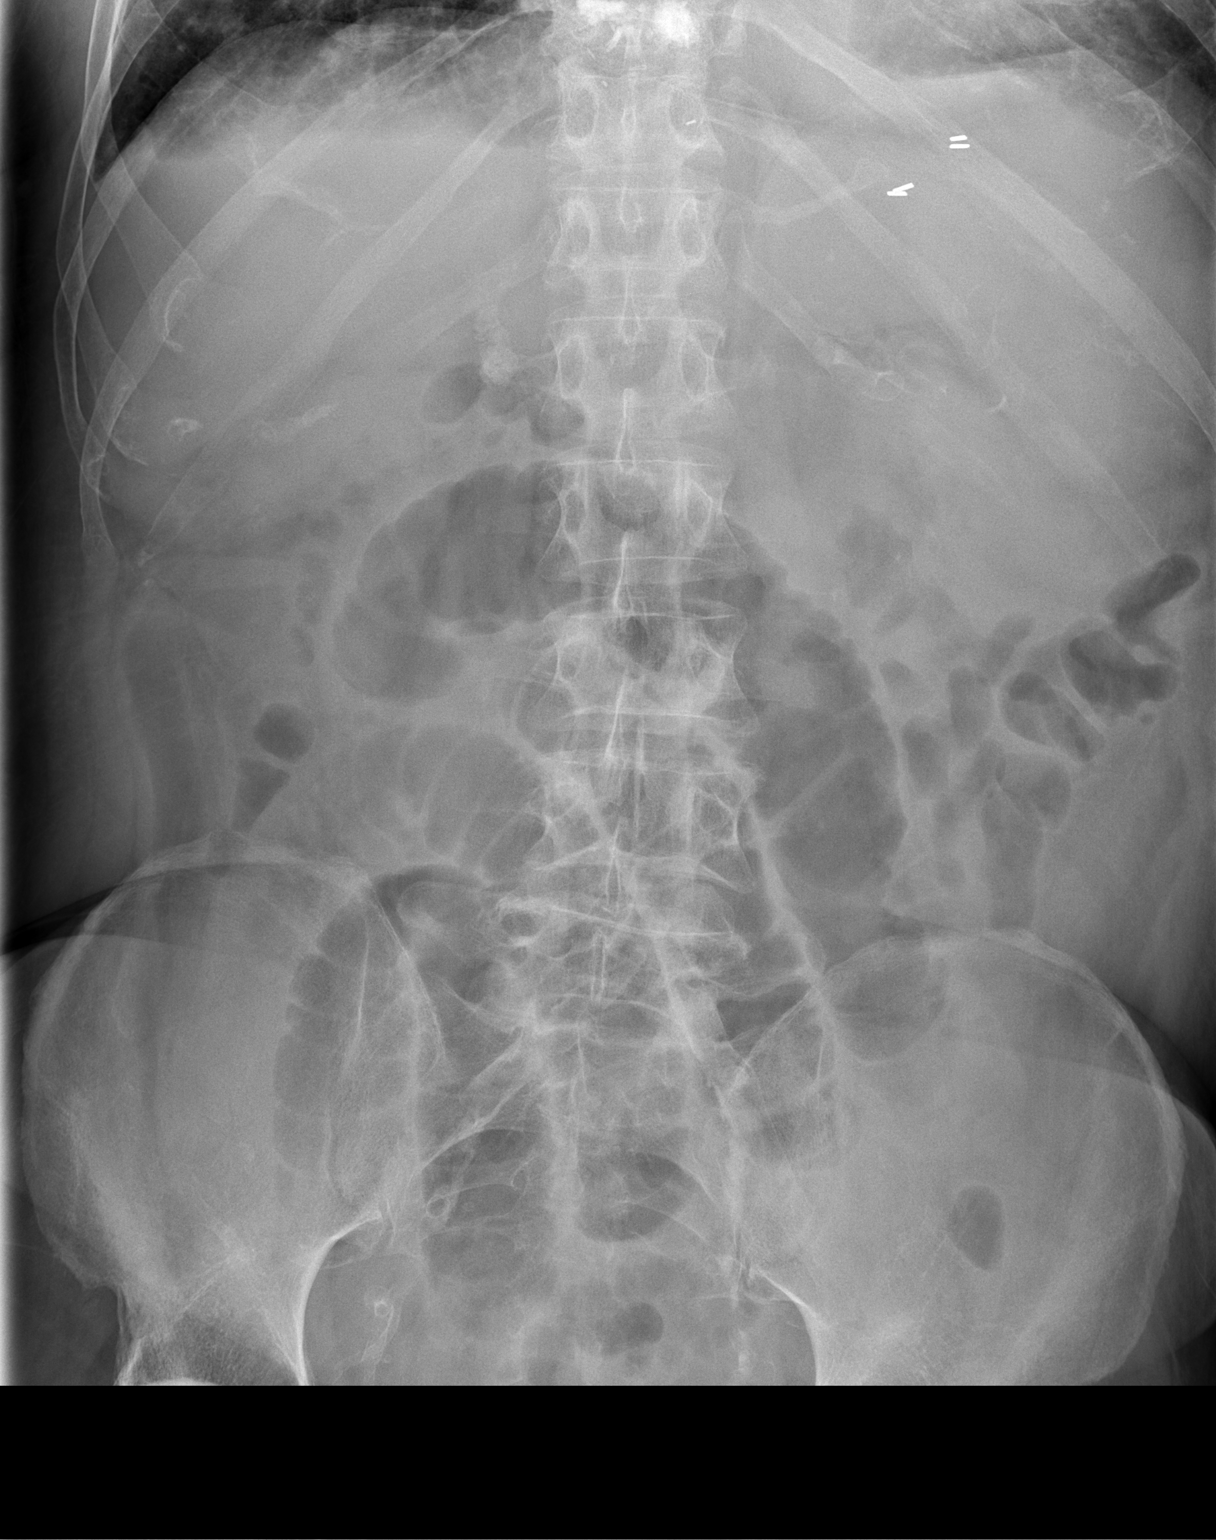

[2 of 2 positions shown; findings below may reference images not displayed]

FINDINGS: There has been interval removal of the esophagogastric tube. There
remain loops of mildly distended gas and fluid-filled small bowel in
the midline. The volume of gas present has decreased somewhat. There
is some gas and fluid within the colon. Previously demonstrated
colonic contrast has cleared. A small amount of air is present
within the stomach. No definite extraluminal gas collections are
demonstrated.
IMPRESSION: Persistent mid to distal relatively high-grade small bowel
obstruction. Interval removal of the esophagogastric tube.

If the patient's clinical status has deteriorated, abdominal and
pelvic CT scanning now would be useful.

## 2016-10-10 NOTE — Addendum Note (Signed)
Addendum  created 10/10/16 1741 by Suzette Battiest, MD   Sign clinical note

## 2016-12-29 NOTE — Progress Notes (Signed)
[name] Date of Birth: [birth year] Medical Record #[id number]  History of Present Illness: [name] is seen for follow up Afib.  He has a history of coronary disease and is status post redo coronary bypass surgery in 2005 after emergent stenting of the left main. This included a free RIMA graft to the OM, SVG to diagonal, and SVG to PDA. LIMA to LAD was intact from original surgery. He has had an old anterior myocardial infarction.  In April 2016 he was admitted with SBO and underwent surgery with exploratory lap and lysis of adhesions. His post op course was complicated by marked volume overload due to IVF with over 20 lb weight gain. He was diuresed. Echo showed EF 45%.  In August 2016 he presented with progressive claudication in the left hip and leg. Dopplers showed severe PAD. Angiography was done by Dr. Oneida Alar and showed severe left iliac and bilateral common femoral disease. He had an abnormal Myoview study and cardiac cath was done and showed patent grafts.  He underwent surgery by Dr. Oneida Alar with bilateral femoral endarterectomy and fem- fem BPG. Follow up with [facility] in May was stable.   Admit 01/04-01/07/18 w/ URI, CHF, new dx atrial fib. Rate control w/ Coreg & dig. Bradycardia w/ Cardizem. Started on Eliquis and Plavix discontinued. TSH nl, EF 40-45% CHA2DS2VASc=6 (age x 2, HTN, DM, CAD, CHF). He was anticoagulated for 4 weeks and underwent DCCV on 05/05/16. Losartan was held for elevated creatinine. This later returned to normal and losartan resumed at 50 mg daily.   On follow up today he is doing very well.   No chest pain or dyspnea.  No significant claudication. Admits he is not walking as much as he should. He has gained 8 lbs. Edema resolved. No dizziness or orthopnea. He was seen by [facility] in May. No significant carotid disease. LE graft patent.   Current Outpatient Prescriptions on File Prior to Visit  Medication Sig Dispense Refill  . apixaban (ELIQUIS) 5 MG TABS tablet Take 1  tablet (5 mg total) by mouth 2 (two) times daily. 60 tablet 0  . aspirin 81 MG tablet Take 81 mg by mouth daily.      . carvedilol (COREG) 12.5 MG tablet TAKE 1 TABLET TWICE A DAY 180 tablet 2  . digoxin (LANOXIN) 0.125 MG tablet Take 0.0625 mg by mouth daily.    Marland Kitchen glimepiride (AMARYL) 4 MG tablet Take 4 mg by mouth daily at 2 PM.     . iron polysaccharides (NIFEREX) 150 MG capsule Take 150 mg by mouth at bedtime.     Marland Kitchen losartan (COZAAR) 50 MG tablet Take 1 tablet (50 mg total) by mouth daily. 90 tablet 3  . nitroGLYCERIN (NITROSTAT) 0.4 MG SL tablet PLACE 1 TABLET (0.4 MG TOTAL) UNDER THE TONGUE EVERY 5 (FIVE) MINUTES AS NEEDED FOR CHEST PAIN. 25 tablet 2  . pioglitazone (ACTOS) 45 MG tablet Take 45 mg by mouth daily.     . simvastatin (ZOCOR) 20 MG tablet Take 1 tablet (20 mg total) by mouth at bedtime. 90 tablet 3   No current facility-administered medications on file prior to visit.     Allergies  Allergen Reactions  . Hctz [Hydrochlorothiazide] Other (See Comments)    Hypokalemia     Past Medical History:  Diagnosis Date  . Acute inferior myocardial infarction (HCC)    hx  . Acute on chronic diastolic CHF (congestive heart failure), NYHA class 1 ([place]) 08/09/2014  . Anemia   .  BPH (benign prostatic hyperplasia)   . CAD (coronary artery disease)   . Dyslipidemia   . History of blood transfusion    "I've had one; don't remember when"  . HTN (hypertension)   . Hyponatremia   . 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 Myocardial infarction [facility]) x2  1986, 2005  . 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 PAD (peripheral artery disease) ([place])   . Type II diabetes mellitus ([place])     Past Surgical History:  Procedure Laterality Date  . CARDIAC CATHETERIZATION  12/23/2014   Procedure: Left Heart Cath and Cors/Grafts Angiography;  Surgeon: Lashea Goda M Martinique, MD;  Location: [place] [facility];  Service: Cardiovascular;;  . CARDIOVERSION N/A 05/05/2016   Procedure:  CARDIOVERSION;  Surgeon: Lelon Perla, MD;  Location: [facility] ENDOSCOPY;  Service: Cardiovascular;  Laterality: N/A;  . CATARACT EXTRACTION W/ INTRAOCULAR LENS  IMPLANT, BILATERAL Bilateral   . CORONARY ANGIOPLASTY WITH STENT PLACEMENT    . CORONARY ARTERY BYPASS GRAFT  1986; redo 2005   ; free Rima to OM, svg-diag,svg-pda  . ENDARTERECTOMY FEMORAL Bilateral 12/31/2014   Procedure: BILATERAL FEMORAL ENDARTERECTOMY;  Surgeon: Elam Dutch, MD;  Location: [place];  Service: Vascular;  Laterality: Bilateral;  . FEMORAL-FEMORAL BYPASS GRAFT Bilateral 12/31/2014   Procedure: FEMORAL-FEMORAL ARTERY BYPASS GRAFT;  Surgeon: Elam Dutch, MD;  Location: [place];  Service: Vascular;  Laterality: Bilateral;  . KYPHOPLASTY  02/29/2012   Procedure: KYPHOPLASTY;  Surgeon: Sinclair Ship, MD;  Location: [place];  Service: Orthopedics;  Laterality: Bilateral;  T 10 kyphoplasty  . LAPAROTOMY N/A 08/02/2014   Procedure: EXPLORATORY LAPAROTOMY LYSIS OF ADHESIONS;  Surgeon: Donnie Mesa, MD;  Location: [place];  Service: General;  Laterality: N/A;  . MELANOMA EXCISION     scalp  . PERIPHERAL VASCULAR CATHETERIZATION N/A 12/05/2014   Procedure: Abdominal Aortogram;  Surgeon: Elam Dutch, MD;  Location: [place] [facility];  Service: Cardiovascular;  Laterality: N/A;  . ROTATOR CUFF REPAIR Right   . TONSILLECTOMY      History  Smoking Status  . Former Smoker  . Packs/day: 2.00  . Years: 25.00  . Types: Cigarettes  . Quit date: 02/22/1986  Smokeless Tobacco  . Never Used    History  Alcohol Use  . 3.6 oz/week  . 6 Cans of beer per week    Family History  Problem Relation Age of Onset  . Dementia Mother     Review of Systems: As noted in history of present illness.  All other systems were reviewed and are negative.  Physical Exam: BP (!) 139/59   Pulse (!) 57   Ht 5\' 9"  (1.753 m)   Wt 179 lb 3.2 oz (81.3 kg)   BMI 26.46 kg/m  GENERAL:  Well appearing WM in NAD HEENT:  PERRL, EOMI,  sclera are clear. Oropharynx is clear. NECK:  No jugular venous distention, carotid upstroke brisk and symmetric, right carotid bruit, no thyromegaly or adenopathy LUNGS:  Clear to auscultation bilaterally CHEST:  Unremarkable HEART:  RRR,  PMI not displaced or sustained,S1 and S2 within normal limits, no S3, no S4: no clicks, no rubs, gr 2/6 systolic murmur LSB ABD:  Soft, nontender. BS +, no masses or bruits. No hepatomegaly, no splenomegaly EXT:  2 + pulses throughout, tr edema, no cyanosis no clubbing SKIN:  Warm and dry.  No rashes NEURO:  Alert and oriented x 3. Cranial nerves II through XII intact.  PSYCH:  Cognitively intact /  LABORATORY DATA: Lab Results  Component Value Date   WBC 4.3 04/29/2016   HGB 10.6 (L) 04/29/2016   HCT 31.8 (L) 04/29/2016   PLT 144 04/29/2016   GLUCOSE 121 (H) 05/19/2016   CHOL 105 04/02/2016   TRIG 54 04/02/2016   HDL 51 04/02/2016   LDLCALC 43 04/02/2016   ALT 15 (L) 12/25/2014   AST 24 12/25/2014   NA 140 05/19/2016   K 4.1 05/19/2016   CL 104 05/19/2016   CREATININE 1.19 (H) 05/19/2016   BUN 18 05/19/2016   CO2 26 05/19/2016   TSH 1.009 03/31/2016   INR 1.41 12/25/2014   HGBA1C 6.5 (H) 04/01/2016   Dated 12/09/16: cholesterol 135, triglycerides 48, HDL 59, LDL 66. A1c 6.6%. Hgb 12. CMET and TSH normal  ECHO: 04/02/2016 - Left ventricle: The cavity size was mildly dilated. Wall thickness was normal. Systolic function was mildly to moderately reduced. The estimated ejection fraction was in the range of 40% to 45%. There is akinesis of the inferolateral myocardium. - Mitral valve: Calcified annulus. There was mild regurgitation. - Aortic valve: Indexed valve area (VTI): 1.11 cm^2/m^2. Peak velocity ratio of LVOT to aortic valve: 0.71. Mean gradient (S): 5 mm Hg. Peak gradient (S): 9 mm Hg. - Left atrium: The atrium was moderately dilated. - Pulmonary arteries: Systolic pressure was moderately increased. PA peak pressure: 51  mm Hg (S). Impressions: - Akinesis of the inferolateral wall with overall mild to moderate LV dysfunction; mild MR; moderate LAE; mild TR; moderately elevated pulmonary pressure.  Ecg today shows NSR rate 60, first degree AV block. NST-T changes c/w dig effect. I have personally reviewed and interpreted this study.  Assessment / Plan: 1. Coronary disease status post redo CABG in 2005. Remote anterior myocardial infarction. Prior left main stent. Cardiac cath in September 2016 showed patent grafts. Continue medical therapy. Given stent in left main I would continue ASA 81 mg daily even though on eliquis.   2. Chronic diastolic CHF secondary to volume resuscitation with SBO in April 2016. Resolved. Appears euvolemic today. EF 40-45%. We will need to watch closely. If he were to develop fluid retention I would be in favor of stopping Actos.   3. Atrial fibrillation s/p DCCV on 05/05/16. Maintaining NSR. It appears Afib triggered by respiratory illness. [place] Vasc score of 6. Will continue Eliquis long term.  4. Hyperlipidemia on Zocor. Excellent control  5. Severe PAD- S/p bilateral femoral endarterectomy and fem-fem bypass. Stressed importance of regular aerobic walking.   6. DM type 2  7. HTN

## 2017-01-02 ENCOUNTER — Ambulatory Visit (INDEPENDENT_AMBULATORY_CARE_PROVIDER_SITE_OTHER): Payer: Medicare Other | Admitting: Cardiology

## 2017-01-02 ENCOUNTER — Encounter: Payer: Self-pay | Admitting: Cardiology

## 2017-01-02 VITALS — BP 139/59 | HR 57 | Ht 69.0 in | Wt 179.2 lb

## 2017-01-02 DIAGNOSIS — I739 Peripheral vascular disease, unspecified: Secondary | ICD-10-CM

## 2017-01-02 DIAGNOSIS — R0989 Other specified symptoms and signs involving the circulatory and respiratory systems: Secondary | ICD-10-CM

## 2017-01-02 DIAGNOSIS — I5022 Chronic systolic (congestive) heart failure: Secondary | ICD-10-CM | POA: Diagnosis not present

## 2017-01-02 DIAGNOSIS — Z951 Presence of aortocoronary bypass graft: Secondary | ICD-10-CM | POA: Diagnosis not present

## 2017-01-02 DIAGNOSIS — I25810 Atherosclerosis of coronary artery bypass graft(s) without angina pectoris: Secondary | ICD-10-CM | POA: Diagnosis not present

## 2017-01-02 NOTE — Patient Instructions (Signed)
You need to walk more and lose some weight  I will see you in 6 months.

## 2017-02-07 IMAGING — NM NM MYOCAR MULTI W/ SPECT
3 series · 18 of 18 positions shown · non-contrast
Comparison: none

[Series 1: stress_(id)_sa · 6.5mm · 6.51mm/px · 6 of 512 frames shown (1 of 2)]
[frame 43/512]
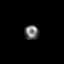
[frame 128/512]
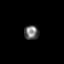
[frame 214/512]
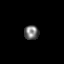
[frame 299/512]
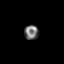
[frame 384/512]
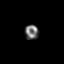
[frame 470/512]
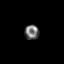

[Series 1: rest_(id)_sa · 6.5mm · 6.51mm/px · 6 of 64 frames shown]
[frame 6/64]
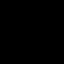
[frame 16/64]
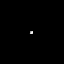
[frame 27/64]
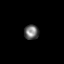
[frame 38/64]
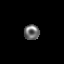
[frame 48/64]
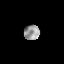
[frame 59/64]
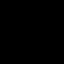

[Series 1: stress_(id)_sa · 6.5mm · 6.51mm/px · 6 of 64 frames shown (2 of 2)]
[frame 6/64]
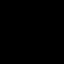
[frame 16/64]
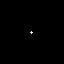
[frame 27/64]
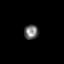
[frame 38/64]
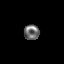
[frame 48/64]
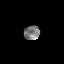
[frame 59/64]
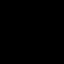

[18 of 18 positions shown; findings below may reference images not displayed]

Canned report from images found in remote index.

Refer to host system for actual result text.

## 2017-03-03 ENCOUNTER — Ambulatory Visit: Payer: Medicare Other | Admitting: Urology

## 2017-03-03 DIAGNOSIS — N401 Enlarged prostate with lower urinary tract symptoms: Secondary | ICD-10-CM

## 2017-03-03 DIAGNOSIS — Z87440 Personal history of urinary (tract) infections: Secondary | ICD-10-CM | POA: Diagnosis not present

## 2017-03-03 DIAGNOSIS — R351 Nocturia: Secondary | ICD-10-CM

## 2017-05-11 ENCOUNTER — Ambulatory Visit (HOSPITAL_COMMUNITY)
Admission: RE | Admit: 2017-05-11 | Discharge: 2017-05-11 | Disposition: A | Discharge status: Home / Self Care | Payer: Medicare Other | Source: Ambulatory Visit | Attending: Family | Admitting: Family

## 2017-05-11 ENCOUNTER — Encounter: Payer: Self-pay | Admitting: Family

## 2017-05-11 ENCOUNTER — Ambulatory Visit: Payer: Medicare Other | Admitting: Family

## 2017-05-11 VITALS — BP 150/62 | HR 58 | Resp 20 | Ht 69.0 in | Wt 179.8 lb

## 2017-05-11 DIAGNOSIS — R0989 Other specified symptoms and signs involving the circulatory and respiratory systems: Secondary | ICD-10-CM | POA: Diagnosis not present

## 2017-05-11 DIAGNOSIS — Z87891 Personal history of nicotine dependence: Secondary | ICD-10-CM

## 2017-05-11 DIAGNOSIS — I779 Disorder of arteries and arterioles, unspecified: Secondary | ICD-10-CM

## 2017-05-11 DIAGNOSIS — Z95828 Presence of other vascular implants and grafts: Secondary | ICD-10-CM

## 2017-05-11 NOTE — Patient Instructions (Signed)

## 2017-05-11 NOTE — Progress Notes (Signed)
[facility] [place]   CC: Follow up peripheral artery occlusive disease  History of Present Illness [name] is a 77 y.o. male with a history of left LE claudication.  He is s/p femoral-femoral bypass with bilateral femoral endarterectomy October 2016 by Dr. Oneida Alar.  He states his left leg is not as strong as his right since his first CABG. He had veins harvested from his left leg for his CABG in 1987, again in 2005 (vein harvested from right leg), stent in the left main coronary artery.   He has no barriers to walking, but has not been walking as much as he used to.  Soles of his feet feel numb, not painful.   He denies pain in his calves or thighs with walking, denies non healing wounds.  He denies any history of stroke or TIA.   Pt Diabetic: Yes, states his last A1C was 6.5 Pt smoker: former smoker, quit in 1987, started at age 78.  Pt meds include: Statin :Yes Betablocker: Yes ASA: Yes Other anticoagulants/antiplatelets: Eliquis, new onset atrial fib in January 2018; pt states this started after several days of severe coughing; cardioverted to NSR.     Past Medical History:  Diagnosis Date  . Acute inferior myocardial infarction (HCC)    hx  . Acute on chronic diastolic CHF (congestive heart failure), NYHA class 1 ([place]) 08/09/2014  . Anemia   . BPH (benign prostatic hyperplasia)   . CAD (coronary artery disease)   . Dyslipidemia   . History of blood transfusion    "I've had one; don't remember when"  . HTN (hypertension)   . Hyponatremia   . 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 Myocardial infarction [facility]) x2  1986, 2005  . 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 PAD (peripheral artery disease) ([place])   . Type II diabetes mellitus ([place])     Social History Social History   Tobacco Use  . Smoking status: Former Smoker    Packs/day: 2.00    Years: 25.00    Pack years: 50.00    Types: Cigarettes    Last  attempt to quit: 02/22/1986    Years since quitting: 31.2  . Smokeless tobacco: Never Used  Substance Use Topics  . Alcohol use: Yes    Alcohol/week: 3.6 oz    Types: 6 Cans of beer per week  . Drug use: No    Family History Family History  Problem Relation Age of Onset  . Dementia Mother     Past Surgical History:  Procedure Laterality Date  . CARDIAC CATHETERIZATION  12/23/2014   Procedure: Left Heart Cath and Cors/Grafts Angiography;  Surgeon: Peter M Martinique, MD;  Location: [place] [facility];  Service: Cardiovascular;;  . CARDIOVERSION N/A 05/05/2016   Procedure: CARDIOVERSION;  Surgeon: Lelon Perla, MD;  Location: [facility] ENDOSCOPY;  Service: Cardiovascular;  Laterality: N/A;  . CATARACT EXTRACTION W/ INTRAOCULAR LENS  IMPLANT, BILATERAL Bilateral   . CORONARY ANGIOPLASTY WITH STENT PLACEMENT    . CORONARY ARTERY BYPASS GRAFT  1986; redo 2005   ; free Rima to OM, svg-diag,svg-pda  . ENDARTERECTOMY FEMORAL Bilateral 12/31/2014   Procedure: BILATERAL FEMORAL ENDARTERECTOMY;  Surgeon: Elam Dutch, MD;  Location: [place];  Service: Vascular;  Laterality: Bilateral;  . FEMORAL-FEMORAL BYPASS GRAFT Bilateral 12/31/2014   Procedure: FEMORAL-FEMORAL ARTERY BYPASS GRAFT;  Surgeon: Elam Dutch, MD;  Location: [place];  Service: Vascular;  Laterality: Bilateral;  .  KYPHOPLASTY  02/29/2012   Procedure: KYPHOPLASTY;  Surgeon: Sinclair Ship, MD;  Location: [place];  Service: Orthopedics;  Laterality: Bilateral;  T 10 kyphoplasty  . LAPAROTOMY N/A 08/02/2014   Procedure: EXPLORATORY LAPAROTOMY LYSIS OF ADHESIONS;  Surgeon: Donnie Mesa, MD;  Location: [place];  Service: General;  Laterality: N/A;  . MELANOMA EXCISION     scalp  . PERIPHERAL VASCULAR CATHETERIZATION N/A 12/05/2014   Procedure: Abdominal Aortogram;  Surgeon: Elam Dutch, MD;  Location: [place] [facility];  Service: Cardiovascular;  Laterality: N/A;  . ROTATOR CUFF REPAIR Right   . TONSILLECTOMY      Allergies   Allergen Reactions  . Hctz [Hydrochlorothiazide] Other (See Comments)    Hypokalemia     Current Outpatient Medications  Medication Sig Dispense Refill  . apixaban (ELIQUIS) 5 MG TABS tablet Take 1 tablet (5 mg total) by mouth 2 (two) times daily. 60 tablet 0  . aspirin 81 MG tablet Take 81 mg by mouth daily.      . carvedilol (COREG) 12.5 MG tablet TAKE 1 TABLET TWICE A DAY 180 tablet 2  . digoxin (LANOXIN) 0.125 MG tablet Take 0.0625 mg by mouth daily.    Marland Kitchen glimepiride (AMARYL) 4 MG tablet Take 4 mg by mouth daily at 2 PM.     . iron polysaccharides (NIFEREX) 150 MG capsule Take 150 mg by mouth at bedtime.     . nitroGLYCERIN (NITROSTAT) 0.4 MG SL tablet PLACE 1 TABLET (0.4 MG TOTAL) UNDER THE TONGUE EVERY 5 (FIVE) MINUTES AS NEEDED FOR CHEST PAIN. 25 tablet 2  . pioglitazone (ACTOS) 45 MG tablet Take 45 mg by mouth daily.     . simvastatin (ZOCOR) 20 MG tablet Take 1 tablet (20 mg total) by mouth at bedtime. 90 tablet 3  . losartan (COZAAR) 50 MG tablet Take 1 tablet (50 mg total) by mouth daily. 90 tablet 3   No current facility-administered medications for this visit.     ROS: See HPI for pertinent positives and negatives.   Physical Examination  Vitals:   05/11/17 1340 05/11/17 1343  BP: (!) 151/59 (!) 150/62  Pulse: (!) 58   Resp: 20   SpO2: 99%   Weight: 179 lb 12.8 oz (81.6 kg)   Height: 5\' 9"  (1.753 m)    Body mass index is 26.55 kg/m.  General: A&O x 3, WDWN. Gait: normal HENT: No gross abnormalities  Eyes: PERRLA. Pulmonary: Respirations are non labored, CTAB, good air movement Cardiac: regular rhythm, no detected murmur.    Carotid Bruits Right Left   Positive Negative   Abdominal aortic pulse is notpalpable. Radial pulses: 2+ palpable and = Fem-fem bypass graft pulse is 3+palpable.  VASCULAR EXAM: Extremitieswithoutischemic changes, withoutGangrene; withoutopen wounds.  LE Pulses Right Left   FEMORAL 3+palpable 2+palpable   POPLITEAL notpalpable  notpalpable  POSTERIOR TIBIAL notpalpable  notpalpable   DORSALIS PEDIS ANTERIOR TIBIAL faintlypalpable  notpalpable    Abdomen: soft, NT, no palpable masses. Skin: no rashes, no ulcers noted. Musculoskeletal: no muscle wasting or atrophy. Neurologic: A&O X 3; appropriate affect, decreased sensation in soles of feet; MOTOR FUNCTION: moving all extremities equally, motor strength 5/5 throughout. Speech is fluent/normal. CN 2-12 intact Psychiatric: Thought content is normal, mood appropriate for clinical situation.     ASSESSMENT: NEWT LEVINGSTON is a 77 y.o. male who is s/p femoral-femoral bypass with bilateral femoral endarterectomy October 2016 for a history of left LE intermittent claudication. He has no claudication since the  surgery. There are no signs of ischemia in his feet/legs. Fem-fem bypass graft pulse is 3+ palpable.   He has a right carotid bruit, has no history of stroke or TIA.   DATA  ABI (Date: 05/11/2017):  R:   ABI: [place] (was [place] on 08-04-16),   PT: tri  DP: waveform morphology not documented (was biphasic)   TBI:  0.23 (was 0.48)  L:   ABI: [place] (was [place]),   PT: bi  DP: tri  TBI: 0.36 (was 0.40)  Stable and non-compressible bilateral ABI, bi and triphasic waveforms.   Carotid Duplex (08-04-16): <40% bilateral ICA stenosis. Triphasic right subclavian artery waveforms, biphasic left subclavian artery waveforms. Bilateral vertebral artery flow is antegrade.  No prior exam for comparison.   PLAN:  Graduated walking program discussed and how to achieve.  Based on the patient's vascular studies and examination, pt will return to clinic in 1 yearwith ABI's, 2 years with carotid duplex.  I advised pt to notify us if he develops concerns re the circulation in his feet or legs.    I discussed in depth with the patient the nature of  atherosclerosis, and emphasized the importance of maximal medical management including strict control of blood pressure, blood glucose, and lipid levels, obtaining regular exercise, and continued cessation of smoking.  The patient is aware that without maximal medical management the underlying atherosclerotic disease process will progress, limiting the benefit of any interventions.  The patient was given information about PAD including signs, symptoms, treatment, what symptoms should prompt the patient to seek immediate medical care, and risk reduction measures to take.  Clemon Chambers, RN, MSN, FNP-C [facility] Phone: [phone]  Clinic MD: Early in [facility]  05/11/17 1:50 PM

## 2017-07-02 NOTE — Progress Notes (Signed)
[name] Date of Birth: 1940/06/13 Medical Record #[id number]  History of Present Illness: [name] is seen for follow up Afib.  He has a history of coronary disease and is status post redo coronary bypass surgery in 2005 after emergent stenting of the left main. This included a free RIMA graft to the OM, SVG to diagonal, and SVG to PDA. LIMA to LAD was intact from original surgery. He has had an old anterior myocardial infarction.  In April 2016 he was admitted with SBO and underwent surgery with exploratory lap and lysis of adhesions. His post op course was complicated by marked volume overload due to IVF with over 20 lb weight gain. He was diuresed. Echo showed EF 45%.  In August 2016 he presented with progressive claudication in the left hip and leg. Dopplers showed severe PAD. Angiography was done by Dr. Oneida Alar and showed severe left iliac and bilateral common femoral disease. He had an abnormal Myoview study and cardiac cath was done and showed patent grafts.  He underwent surgery by Dr. Oneida Alar with bilateral femoral endarterectomy and fem- fem BPG.   Admit 01/04-01/07/18 w/ URI, CHF, new dx atrial fib. Rate control w/ Coreg & dig. Bradycardia w/ Cardizem. Started on Eliquis and Plavix discontinued. TSH nl, EF 40-45% CHA2DS2VASc=6 (age x 2, HTN, DM, CAD, CHF). He was anticoagulated for 4 weeks and underwent DCCV on 05/05/16. Losartan was held for elevated creatinine. This later returned to normal and losartan resumed at 50 mg daily.   On follow up today he is doing very well.   No chest pain or dyspnea.  He denies any claudication.  He is trying to walk 20 minutes daily. Weight is stable. No edema.  No dizziness or orthopnea. He was seen by [facility] in February. No significant carotid disease. LE graft patent.   Current Outpatient Medications on File Prior to Visit  Medication Sig Dispense Refill  . apixaban (ELIQUIS) 5 MG TABS tablet Take 1 tablet (5 mg total) by mouth 2 (two) times daily. 60  tablet 0  . aspirin 81 MG tablet Take 81 mg by mouth daily.      . carvedilol (COREG) 12.5 MG tablet TAKE 1 TABLET TWICE A DAY 180 tablet 2  . digoxin (LANOXIN) 0.125 MG tablet Take 0.125 mg by mouth every other day.     Marland Kitchen glimepiride (AMARYL) 4 MG tablet Take 4 mg by mouth daily at 2 PM.     . iron polysaccharides (NIFEREX) 150 MG capsule Take 150 mg by mouth at bedtime.     . nitroGLYCERIN (NITROSTAT) 0.4 MG SL tablet PLACE 1 TABLET (0.4 MG TOTAL) UNDER THE TONGUE EVERY 5 (FIVE) MINUTES AS NEEDED FOR CHEST PAIN. 25 tablet 2  . pioglitazone (ACTOS) 45 MG tablet Take 45 mg by mouth daily.     . simvastatin (ZOCOR) 20 MG tablet Take 1 tablet (20 mg total) by mouth at bedtime. 90 tablet 3  . losartan (COZAAR) 50 MG tablet Take 1 tablet (50 mg total) by mouth daily. 90 tablet 3   No current facility-administered medications on file prior to visit.     Allergies  Allergen Reactions  . Hctz [Hydrochlorothiazide] Other (See Comments)    Hypokalemia     Past Medical History:  Diagnosis Date  . Acute inferior myocardial infarction (HCC)    hx  . Acute on chronic diastolic CHF (congestive heart failure), NYHA class 1 ([place]) 08/09/2014  . Anemia   . BPH (benign prostatic hyperplasia)   .  CAD (coronary artery disease)   . Dyslipidemia   . History of blood transfusion    "I've had one; don't remember when"  . HTN (hypertension)   . Hyponatremia   . 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 Myocardial infarction [facility]) x2  1986, 2005  . 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 PAD (peripheral artery disease) ([place])   . Type II diabetes mellitus ([place])     Past Surgical History:  Procedure Laterality Date  . CARDIAC CATHETERIZATION  12/23/2014   Procedure: Left Heart Cath and Cors/Grafts Angiography;  Surgeon: Amarrion Pastorino M [place], MD;  Location: [place] [facility];  Service: Cardiovascular;;  . CARDIOVERSION N/A 05/05/2016   Procedure: CARDIOVERSION;  Surgeon: Lelon Perla, MD;   Location: [facility] ENDOSCOPY;  Service: Cardiovascular;  Laterality: N/A;  . CATARACT EXTRACTION W/ INTRAOCULAR LENS  IMPLANT, BILATERAL Bilateral   . CORONARY ANGIOPLASTY WITH STENT PLACEMENT    . CORONARY ARTERY BYPASS GRAFT  1986; redo 2005   ; free Rima to OM, svg-diag,svg-pda  . ENDARTERECTOMY FEMORAL Bilateral 12/31/2014   Procedure: BILATERAL FEMORAL ENDARTERECTOMY;  Surgeon: Elam Dutch, MD;  Location: [place];  Service: Vascular;  Laterality: Bilateral;  . FEMORAL-FEMORAL BYPASS GRAFT Bilateral 12/31/2014   Procedure: FEMORAL-FEMORAL ARTERY BYPASS GRAFT;  Surgeon: Elam Dutch, MD;  Location: [place];  Service: Vascular;  Laterality: Bilateral;  . KYPHOPLASTY  02/29/2012   Procedure: KYPHOPLASTY;  Surgeon: Sinclair Ship, MD;  Location: [place];  Service: Orthopedics;  Laterality: Bilateral;  T 10 kyphoplasty  . LAPAROTOMY N/A 08/02/2014   Procedure: EXPLORATORY LAPAROTOMY LYSIS OF ADHESIONS;  Surgeon: Donnie Mesa, MD;  Location: [place];  Service: General;  Laterality: N/A;  . MELANOMA EXCISION     scalp  . PERIPHERAL VASCULAR CATHETERIZATION N/A 12/05/2014   Procedure: Abdominal Aortogram;  Surgeon: Elam Dutch, MD;  Location: [place] [facility];  Service: Cardiovascular;  Laterality: N/A;  . ROTATOR CUFF REPAIR Right   . TONSILLECTOMY      Social History   Tobacco Use  Smoking Status Former Smoker  . Packs/day: 2.00  . Years: 25.00  . Pack years: 50.00  . Types: Cigarettes  . Last attempt to quit: 02/22/1986  . Years since quitting: 31.3  Smokeless Tobacco Never Used    Social History   Substance and Sexual Activity  Alcohol Use Yes  . Alcohol/week: 3.6 oz  . Types: 6 Cans of beer per week    Family History  Problem Relation Age of Onset  . Dementia Mother     Review of Systems: As noted in history of present illness.  All other systems were reviewed and are negative.  Physical Exam: BP (!) 135/57 (BP Location: Right Arm, Patient Position: Sitting,  Cuff Size: Normal)   Pulse 68   Ht 5\' 9"  (1.753 m)   Wt 179 lb (81.2 kg)   BMI 26.43 kg/m  GENERAL:  Well appearing WM in NAD HEENT:  PERRL, EOMI, sclera are clear. Oropharynx is clear. NECK:  No jugular venous distention, carotid upstroke brisk and symmetric, right carotid bruit, no thyromegaly or adenopathy LUNGS:  Clear to auscultation bilaterally CHEST:  Unremarkable HEART:  RRR,  PMI not displaced or sustained,S1 and S2 within normal limits, no S3, no S4: no clicks, no rubs, no murmurs ABD:  Soft, nontender. BS +, no masses or bruits. No hepatomegaly, no splenomegaly EXT:  2 + pulses throughout, no edema, no cyanosis no clubbing  SKIN:  Warm and dry.  No rashes NEURO:  Alert and oriented x 3. Cranial nerves II through XII intact. PSYCH:  Cognitively intact   LABORATORY DATA: Lab Results  Component Value Date   WBC 4.3 04/29/2016   HGB 10.6 (L) 04/29/2016   HCT 31.8 (L) 04/29/2016   PLT 144 04/29/2016   GLUCOSE 121 (H) 05/19/2016   CHOL 105 04/02/2016   TRIG 54 04/02/2016   HDL 51 04/02/2016   LDLCALC 43 04/02/2016   ALT 15 (L) 12/25/2014   AST 24 12/25/2014   NA 140 05/19/2016   K 4.1 05/19/2016   CL 104 05/19/2016   CREATININE 1.19 (H) 05/19/2016   BUN 18 05/19/2016   CO2 26 05/19/2016   TSH 1.009 03/31/2016   INR 1.41 12/25/2014   HGBA1C 6.5 (H) 04/01/2016   Dated 12/09/16: cholesterol 135, triglycerides 48, HDL 59, LDL 66. A1c 6.6%. Hgb 12. CMET and TSH normal  ECHO: 04/02/2016 - Left ventricle: The cavity size was mildly dilated. Wall thickness was normal. Systolic function was mildly to moderately reduced. The estimated ejection fraction was in the range of 40% to 45%. There is akinesis of the inferolateral myocardium. - Mitral valve: Calcified annulus. There was mild regurgitation. - Aortic valve: Indexed valve area (VTI): 1.11 cm^2/m^2. Peak velocity ratio of LVOT to aortic valve: 0.71. Mean gradient (S): 5 mm Hg. Peak gradient (S): 9 mm Hg. -  Left atrium: The atrium was moderately dilated. - Pulmonary arteries: Systolic pressure was moderately increased. PA peak pressure: 51 mm Hg (S). Impressions: - Akinesis of the inferolateral wall with overall mild to moderate LV dysfunction; mild MR; moderate LAE; mild TR; moderately elevated pulmonary pressure.  Ecg today shows NSR rate 68, first degree AV block. NST-T changes c/w dig effect. I have personally reviewed and interpreted this study.  Assessment / Plan: 1. Coronary disease status post redo CABG in 2005. Remote anterior myocardial infarction. Prior left main stent. Cardiac cath in September 2016 showed patent grafts. He has very rare chest tightness. Continue medical therapy. Given stent in left main I would continue ASA 81 mg daily even though on eliquis.   2. Chronic systolic/diastolic CHF  Appears euvolemic today. EF 40-45%.   3. Atrial fibrillation s/p DCCV on 05/05/16. Maintaining NSR.  Mali Vasc score of 6. Will continue Eliquis long term.  4. Hyperlipidemia on Zocor. Excellent control  5. Severe PAD- S/p bilateral femoral endarterectomy and fem-fem bypass. Stressed importance of regular aerobic walking. Seen by [facility] in February and LE arterial dopplers and carotid dopplers were stable.   6. DM type 2  7. HTN

## 2017-07-03 ENCOUNTER — Encounter: Payer: Self-pay | Admitting: Cardiology

## 2017-07-03 ENCOUNTER — Ambulatory Visit: Payer: Medicare Other | Admitting: Cardiology

## 2017-07-03 VITALS — BP 135/57 | HR 68 | Ht 69.0 in | Wt 179.0 lb

## 2017-07-03 DIAGNOSIS — I25810 Atherosclerosis of coronary artery bypass graft(s) without angina pectoris: Secondary | ICD-10-CM

## 2017-07-03 DIAGNOSIS — Z7901 Long term (current) use of anticoagulants: Secondary | ICD-10-CM

## 2017-07-03 DIAGNOSIS — Z951 Presence of aortocoronary bypass graft: Secondary | ICD-10-CM | POA: Diagnosis not present

## 2017-07-03 DIAGNOSIS — I739 Peripheral vascular disease, unspecified: Secondary | ICD-10-CM

## 2017-07-03 DIAGNOSIS — I480 Paroxysmal atrial fibrillation: Secondary | ICD-10-CM

## 2017-07-03 NOTE — Patient Instructions (Signed)
Continue your current therapy  I will see you in 6 months.   

## 2017-12-27 NOTE — Progress Notes (Signed)
Cardiology Office Note:    Date:  01/01/2018   ID:  ALEKS NAWROT, DOB [birth year], MRN [id number]  PCP:  Lawerance Cruel, MD  Cardiologist:  Peter [place], MD   Referring MD: Lawerance Cruel, MD   Chief Complaint  Patient presents with  . Follow-up    pt denied chest pain    History of Present Illness:    [name] is a 77 y.o. male with a hx of CAD with remote MI s/p redo CABG 2005 after emergent stenting of left main (free RIMA graft to the OM, SVG to diagonal, and SVG to PDA. LIMA to LAD was intact from original surgery), HTN, chronic combined systolic and diastolic heart failure, PAD/PVD, and Afib RVR. He as admitted 03/2016 and found to have a new diagnosis of Afib, rate controlled with coreg and digoxin. Plavix was discontinued and eliquis started for a CHA2DS2VASc=6 (age x 2, HTN, DM, CAD, CHF). Echo with LVEF 40-45%. He was anticoagulated for 4 weks and underwent DCCV 05/05/16. He last saw Dr. Martin[facility] in clinic on 07/03/16 and was doing well and maintaining sinus rhythm. He is s/p bilateral femoral endarterectomy and fem-fem bypass.   He presents today for 81-month follow up.  Overall, he is doing well.  He walks 20 to 30 minutes 2-3 times weekly.  He reports no chest pain, shortness of breath, claudication, dizziness, or syncope.  He states that he takes a nitro about once per month before walking.  He is not having symptoms when he does this, he just feels like he should take a nitro from time to time.  We discussed that if he started to need nitro, he should call the office for an appointment.  His PCP follows his lipid profile.  He is having no bleeding issues on Eliquis and aspirin.  His rhythm is regular today.  He has baseline lower extremity swelling.  His swelling today is normal for him.  He denies dyspnea on exertion and orthopnea.   Past Medical History:  Diagnosis Date  . Acute inferior myocardial infarction (HCC)    hx  . Acute on chronic diastolic CHF (congestive  heart failure), NYHA class 1 ([place]) 08/09/2014  . Anemia   . BPH (benign prostatic hyperplasia)   . CAD (coronary artery disease)   . Dyslipidemia   . History of blood transfusion    "I've had one; don't remember when"  . HTN (hypertension)   . Hyponatremia   . 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 Myocardial infarction [facility]) x2  1986, 2005  . 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 PAD (peripheral artery disease) ([place])   . Type II diabetes mellitus ([place])     Past Surgical History:  Procedure Laterality Date  . CARDIAC CATHETERIZATION  12/23/2014   Procedure: Left Heart Cath and Cors/Grafts Angiography;  Surgeon: Peter M [place], MD;  Location: [place] [facility];  Service: Cardiovascular;;  . CARDIOVERSION N/A 05/05/2016   Procedure: CARDIOVERSION;  Surgeon: Lelon Perla, MD;  Location[facility] ENDOSCOPY;  Service: Cardiovascular;  Laterality: N/A;  . CATARACT EXTRACTION W/ INTRAOCULAR LENS  IMPLANT, BILATERAL Bilateral   . CORONARY ANGIOPLASTY WITH STENT PLACEMENT    . CORONARY ARTERY BYPASS GRAFT  1986; redo 2005   ; free Rima to OM, svg-diag,svg-pda  . ENDARTERECTOMY FEMORAL Bilateral 12/31/2014   Procedure: BILATERAL FEMORAL ENDARTERECTOMY;  Surgeon: Elam Dutch, MD;  Location: [place];  Service: Vascular;  Laterality:  Bilateral;  . FEMORAL-FEMORAL BYPASS GRAFT Bilateral 12/31/2014   Procedure: FEMORAL-FEMORAL ARTERY BYPASS GRAFT;  Surgeon: Elam Dutch, MD;  Location: [place];  Service: Vascular;  Laterality: Bilateral;  . KYPHOPLASTY  02/29/2012   Procedure: KYPHOPLASTY;  Surgeon: Sinclair Ship, MD;  Location: [place];  Service: Orthopedics;  Laterality: Bilateral;  T 10 kyphoplasty  . LAPAROTOMY N/A 08/02/2014   Procedure: EXPLORATORY LAPAROTOMY LYSIS OF ADHESIONS;  Surgeon: Donnie Mesa, MD;  Location: [place];  Service: General;  Laterality: N/A;  . MELANOMA EXCISION     scalp  . PERIPHERAL VASCULAR CATHETERIZATION N/A 12/05/2014   Procedure: Abdominal  Aortogram;  Surgeon: Elam Dutch, MD;  Location: [place] [facility];  Service: Cardiovascular;  Laterality: N/A;  . ROTATOR CUFF REPAIR Right   . TONSILLECTOMY      Current Medications: Current Meds  Medication Sig  . apixaban (ELIQUIS) 5 MG TABS tablet Take 1 tablet (5 mg total) by mouth 2 (two) times daily.  Marland Kitchen aspirin 81 MG tablet Take 81 mg by mouth daily.    . carvedilol (COREG) 12.5 MG tablet TAKE 1 TABLET TWICE A DAY  . digoxin (LANOXIN) 0.125 MG tablet Take 0.125 mg by mouth every other day.   Marland Kitchen glimepiride (AMARYL) 4 MG tablet Take 4 mg by mouth daily at 2 PM.   . iron polysaccharides (NIFEREX) 150 MG capsule Take 150 mg by mouth at bedtime.   . nitroGLYCERIN (NITROSTAT) 0.4 MG SL tablet PLACE 1 TABLET (0.4 MG TOTAL) UNDER THE TONGUE EVERY 5 (FIVE) MINUTES AS NEEDED FOR CHEST PAIN.  Marland Kitchen pioglitazone (ACTOS) 45 MG tablet Take 45 mg by mouth daily.   . simvastatin (ZOCOR) 20 MG tablet Take 1 tablet (20 mg total) by mouth at bedtime.     Allergies:   Hctz [hydrochlorothiazide]   Social History   Socioeconomic History  . Marital status: Married    Spouse name: Not on file  . Number of children: 2  . Years of education: Not on file  . Highest education level: Not on file  Occupational History  . Occupation: Optician, dispensing: RETIRED  Social Needs  . Financial resource strain: Not on file  . Food insecurity:    Worry: Not on file    Inability: Not on file  . Transportation needs:    Medical: Not on file    Non-medical: Not on file  Tobacco Use  . Smoking status: Former Smoker    Packs/day: 2.00    Years: 25.00    Pack years: 50.00    Types: Cigarettes    Last attempt to quit: 02/22/1986    Years since quitting: 31.8  . Smokeless tobacco: Never Used  Substance and Sexual Activity  . Alcohol use: Yes    Alcohol/week: 6.0 standard drinks    Types: 6 Cans of beer per week  . Drug use: No  . Sexual activity: Not Currently  Lifestyle  . Physical activity:      Days per week: Not on file    Minutes per session: Not on file  . Stress: Not on file  Relationships  . Social connections:    Talks on phone: Not on file    Gets together: Not on file    Attends religious service: Not on file    Active member of club or organization: Not on file    Attends meetings of clubs or organizations: Not on file    Relationship status: Not on file  Other Topics Concern  . Not on file  Social History Narrative  . Not on file     Family History: The patient's family history includes Dementia in his mother.  ROS:   Please see the history of present illness.     All other systems reviewed and are negative.  EKGs/Labs/Other Studies Reviewed:    The following studies were reviewed today:  Echo 03/2016: Study Conclusions - Left ventricle: The cavity size was mildly dilated. Wall   thickness was normal. Systolic function was mildly to moderately   reduced. The estimated ejection fraction was in the range of 40%   to 45%. There is akinesis of the inferolateral myocardium. - Mitral valve: Calcified annulus. There was mild regurgitation. - Left atrium: The atrium was moderately dilated. - Pulmonary arteries: Systolic pressure was moderately increased.   PA peak pressure: 51 mm Hg (S).  Impressions: - Akinesis of the inferolateral wall with overall mild to moderate   LV dysfunction; mild MR; moderate LAE; mild TR; moderately   elevated pulmonary pressure.   EKG:  EKG is not ordered today.    Recent Labs: No results found for requested labs within last 8760 hours.  Recent Lipid Panel    Component Value Date/Time   CHOL 105 04/02/2016 0523   TRIG 54 04/02/2016 0523   HDL 51 04/02/2016 0523   CHOLHDL 2.1 04/02/2016 0523   VLDL 11 04/02/2016 0523   LDLCALC 43 04/02/2016 0523    Physical Exam:    VS:  BP 110/62   Pulse 70   Ht 5\' 9"  (1.753 m)   Wt 183 lb 12.8 oz (83.4 kg)   SpO2 98%   BMI 27.14 kg/m     Wt Readings from Last 3  Encounters:  01/01/18 183 lb 12.8 oz (83.4 kg)  07/03/17 179 lb (81.2 kg)  05/11/17 179 lb 12.8 oz (81.6 kg)     GEN: Well nourished, well developed in no acute distress HEENT: Normal NECK: No JVD; No carotid bruits CARDIAC: RRR, no murmurs, rubs, gallops RESPIRATORY:  Clear to auscultation without rales, wheezing or rhonchi  ABDOMEN: Soft, non-tender, non-distended MUSCULOSKELETAL:  No edema; No deformity  SKIN: Warm and dry NEUROLOGIC:  Alert and oriented x 3 PSYCHIATRIC:  Normal affect   ASSESSMENT:    1. Coronary artery disease involving coronary bypass graft of native heart without angina pectoris   2. S/P CABG (coronary artery bypass graft)   3. Chronic combined systolic and diastolic heart failure ([place])   4. PAD (peripheral artery disease) ([place])   5. Essential (primary) hypertension   6. Atrial fibrillation with RVR ([place])   7. Hyperlipidemia LDL goal <70    PLAN:    In order of problems listed above:  Coronary artery disease involving coronary bypass graft of native heart without angina pectoris S/P CABG (coronary artery bypass graft) Pt denies chest pain and shortness of breath. He takes about 1 nitro (every 10th walk) monthly - not for symptoms, just because he feels he should. We discussed when he needs to call the office.   Chronic combined systolic and diastolic heart failure ([place]) He denies dyspnea on exertion and orthopnea.  His lower extremity swelling is at baseline.  He did have SVG from his left leg and is status post femorofemoral bypass.  He is doing well without a diuretic regimen.  PAD (peripheral artery disease) ([place]) He walked 30 minutes yesterday.  He reports no claudication.  Essential (primary) hypertension Well-controlled, no medication changes.  Atrial fibrillation with RVR (HCC) Regular rhythm today on exam.  Hyperlipidemia LDL goal <70 Continue statin, PCP follow his lipids.  Follow-up with Dr. [place] in 6 months.   Medication  Adjustments/Labs and Tests Ordered: Current medicines are reviewed at length with the patient today.  Concerns regarding medicines are outlined above.  No orders of the defined types were placed in this encounter.  No orders of the defined types were placed in this encounter.   Signed, Tami Lin Bingham [place], PA  01/01/2018 2:00 PM    [place]

## 2018-01-01 ENCOUNTER — Encounter: Payer: Self-pay | Admitting: Physician Assistant

## 2018-01-01 ENCOUNTER — Ambulatory Visit: Payer: Medicare Other | Admitting: Physician Assistant

## 2018-01-01 VITALS — BP 110/62 | HR 70 | Ht 69.0 in | Wt 183.8 lb

## 2018-01-01 DIAGNOSIS — I10 Essential (primary) hypertension: Secondary | ICD-10-CM

## 2018-01-01 DIAGNOSIS — I739 Peripheral vascular disease, unspecified: Secondary | ICD-10-CM

## 2018-01-01 DIAGNOSIS — I4891 Unspecified atrial fibrillation: Secondary | ICD-10-CM

## 2018-01-01 DIAGNOSIS — I5042 Chronic combined systolic (congestive) and diastolic (congestive) heart failure: Secondary | ICD-10-CM

## 2018-01-01 DIAGNOSIS — I25810 Atherosclerosis of coronary artery bypass graft(s) without angina pectoris: Secondary | ICD-10-CM

## 2018-01-01 DIAGNOSIS — Z951 Presence of aortocoronary bypass graft: Secondary | ICD-10-CM

## 2018-01-01 DIAGNOSIS — E785 Hyperlipidemia, unspecified: Secondary | ICD-10-CM

## 2018-01-01 NOTE — Patient Instructions (Signed)
Medication Instructions:  Doreene Adas, PA recommends that you continue on your current medications as directed. Please refer to the Current Medication list given to you today. If you need a refill on your cardiac medications before your next appointment, please call your pharmacy.   Lab work: NONE ORDERED If you have labs (blood work) drawn today and your tests are completely normal, you will receive your results only by: Marland Kitchen MyChart Message (if you have MyChart) OR . A paper copy in the mail If you have any lab test that is abnormal or we need to change your treatment, we will call you to review the results.  Testing/Procedures: NONE ORDERED  Follow-Up: At [facility], you and your health needs are our priority.  As part of our continuing mission to provide you with exceptional heart care, we have created designated Provider Care Teams.  These Care Teams include your primary Cardiologist (physician) and Advanced Practice Providers (APPs -  Physician Assistants and Nurse Practitioners) who all work together to provide you with the care you need, when you need it. You will need a follow up appointment in 6 months.  Please call our office 2 months in advance to schedule this appointment.  You may see Peter Martinique, MD only or one of the following Advanced Practice Providers on your designated Care Team: [place], [place] . Fabian Sharp, PA-C

## 2018-07-05 ENCOUNTER — Encounter (HOSPITAL_COMMUNITY): Payer: Medicare Other

## 2018-07-05 ENCOUNTER — Ambulatory Visit: Payer: Medicare Other | Admitting: Family

## 2018-07-10 ENCOUNTER — Ambulatory Visit: Payer: Medicare Other | Admitting: Cardiology

## 2018-09-23 NOTE — Progress Notes (Signed)
Virtual Visit via Telephone Note   This visit type was conducted due to national recommendations for restrictions regarding the COVID-19 Pandemic (e.g. social distancing) in an effort to limit this patient's exposure and mitigate transmission in our community.  Due to his co-morbid illnesses, this patient is at least at moderate risk for complications without adequate follow up.  This format is felt to be most appropriate for this patient at this time.  The patient did not have access to video technology/had technical difficulties with video requiring transitioning to audio format only (telephone).  All issues noted in this document were discussed and addressed.  No physical exam could be performed with this format.  Please refer to the patient's chart for his  consent to telehealth for [facility].   Date:  09/25/2018   ID:  JERRELL MANGEL, DOB Apr 18, 1940, MRN [id number]  Patient Location: Home Provider Location: Office  PCP:  Lawerance Cruel, MD  Cardiologist:  Javone Ybanez Martinique, MD  Electrophysiologist:  None   Evaluation Performed:  Follow-Up Visit  Chief Complaint:  Follow up CAD  History of Present Illness:    [name] is a 78 y.o. male with history of coronary disease and is status post redo coronary bypass surgery in 2005 after emergent stenting of the left main. This included a free RIMA graft to the OM, SVG to diagonal, and SVG to PDA. LIMA to LAD was intact from original surgery. He has had an old anterior myocardial infarction.  In April 2016 he was admitted with SBO and underwent surgery with exploratory lap and lysis of adhesions. His post op course was complicated by marked volume overload due to IVF with over 20 lb weight gain. He was diuresed. Echo showed EF 45%.  In August 2016 he presented with progressive claudication in the left hip and leg. Dopplers showed severe PAD. Angiography was done by Dr. Oneida Alar and showed severe left iliac and bilateral common femoral  disease. He had an abnormal Myoview study and cardiac cath was done and showed patent grafts.  He underwent surgery by Dr. Oneida Alar with bilateral femoral endarterectomy and fem- fem BPG.   Admit 01/04-01/07/18 w/ URI, CHF, new dx atrial fib. Rate control w/ Coreg & dig. Bradycardia w/ Cardizem. Started on Eliquis and Plavix discontinued. TSH nl, EF 40-45% CHA2DS2VASc=6 (age x 2, HTN, DM, CAD, CHF). He was anticoagulated for 4 weeks and underwent DCCV on 05/05/16. Losartan was held for elevated creatinine. This later returned to normal and losartan resumed at 50 mg daily.   On follow up today he is doing very well. Able to walk up to 45 minutes without chest pain, dyspnea or significant claudication. Notes some weakness in his left leg. No edema. No palpitations.   The patient does not have symptoms concerning for COVID-19 infection (fever, chills, cough, or new shortness of breath).    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"   Myocardial infarction ([place]) x2  1986, 2005   Osteoarthritis    IN FINGERS (  03/31/2016)   PAD (peripheral artery disease) ([place])    Type II diabetes mellitus ([place])    Past Surgical History:  Procedure Laterality Date   CARDIAC CATHETERIZATION  12/23/2014   Procedure: Left Heart Cath and Cors/Grafts Angiography;  Surgeon: Loveta Dellis M Martinique, MD;  Location: [place] [facility];  Service: Cardiovascular;;   CARDIOVERSION N/A 05/05/2016   Procedure: CARDIOVERSION;  Surgeon: Lelon Perla, MD;  Location: [facility] ENDOSCOPY;  Service: Cardiovascular;  Laterality: N/A;    CATARACT EXTRACTION W/ INTRAOCULAR LENS  IMPLANT, BILATERAL Bilateral    CORONARY ANGIOPLASTY WITH STENT PLACEMENT     CORONARY ARTERY BYPASS GRAFT  1986; redo 2005   ; free Rima to OM, svg-diag,svg-pda   ENDARTERECTOMY FEMORAL Bilateral 12/31/2014   Procedure: BILATERAL FEMORAL ENDARTERECTOMY;  Surgeon: Elam Dutch, MD;  Location: [place];  Service: Vascular;  Laterality: Bilateral;   FEMORAL-FEMORAL BYPASS GRAFT Bilateral 12/31/2014   Procedure: FEMORAL-FEMORAL ARTERY BYPASS GRAFT;  Surgeon: Elam Dutch, MD;  Location: [place];  Service: Vascular;  Laterality: Bilateral;   KYPHOPLASTY  02/29/2012   Procedure: KYPHOPLASTY;  Surgeon: Sinclair Ship, MD;  Location: [place];  Service: Orthopedics;  Laterality: Bilateral;  T 10 kyphoplasty   LAPAROTOMY N/A 08/02/2014   Procedure: EXPLORATORY LAPAROTOMY LYSIS OF ADHESIONS;  Surgeon: Donnie Mesa, MD;  Location: [place];  Service: General;  Laterality: N/A;   MELANOMA EXCISION     scalp   PERIPHERAL VASCULAR CATHETERIZATION N/A 12/05/2014   Procedure: Abdominal Aortogram;  Surgeon: Elam Dutch, MD;  Location: [place] [facility];  Service: Cardiovascular;  Laterality: N/A;   ROTATOR CUFF REPAIR Right    TONSILLECTOMY       Current Meds  Medication Sig   ALPRAZolam (XANAX) 0.5 MG tablet TAKE 1 TABLET ONCE A DAY FOR 30 DAYS   apixaban (ELIQUIS) 5 MG TABS tablet Take 1 tablet (5 mg total) by mouth 2 (two) times daily.   aspirin 81 MG tablet Take 81 mg by mouth daily.     carvedilol (COREG) 12.5 MG tablet TAKE 1 TABLET TWICE A DAY   digoxin (LANOXIN) 0.125 MG tablet Take 0.125 mg by mouth every other day.    glimepiride (AMARYL) 4 MG tablet Take 4 mg by mouth daily at 2 PM.    iron polysaccharides (NIFEREX) 150 MG capsule Take 150 mg by mouth at bedtime.    nitroGLYCERIN (NITROSTAT) 0.4 MG SL tablet PLACE 1 TABLET (0.4 MG TOTAL) UNDER THE TONGUE EVERY 5 (FIVE) MINUTES AS NEEDED FOR CHEST PAIN.   pioglitazone (ACTOS) 45  MG tablet Take 45 mg by mouth daily.    simvastatin (ZOCOR) 20 MG tablet Take 1 tablet (20 mg total) by mouth at bedtime.     Allergies:   Hctz [hydrochlorothiazide]   Social History   Tobacco Use   Smoking status: Former Smoker    Packs/day: 2.00    Years: 25.00    Pack years: 50.00    Types: Cigarettes    Quit date: 02/22/1986    Years since quitting: 32.6   Smokeless tobacco: Never Used  Substance Use Topics   Alcohol use: Yes    Alcohol/week: 6.0 standard drinks    Types: 6 Cans of beer per week   Drug use: No     Family Hx: The patient's family history includes Dementia in his mother.  ROS:   Please see the history of present illness.    All other systems reviewed and are negative.   Prior CV  studies:   The following studies were reviewed today:  Echo 04/02/16: Study Conclusions  - Left ventricle: The cavity size was mildly dilated. Wall   thickness was normal. Systolic function was mildly to moderately   reduced. The estimated ejection fraction was in the range of 40%   to 45%. There is akinesis of the inferolateral myocardium. - Mitral valve: Calcified annulus. There was mild regurgitation. - Left atrium: The atrium was moderately dilated. - Pulmonary arteries: Systolic pressure was moderately increased.   PA peak pressure: 51 mm Hg (S).  Impressions:  - Akinesis of the inferolateral wall with overall mild to moderate   LV dysfunction; mild MR; moderate LAE; mild TR; moderately   elevated pulmonary pressure.  Labs/Other Tests and Data Reviewed:    EKG:  No ECG reviewed.  Recent Labs: No results found for requested labs within last 8760 hours.   Recent Lipid Panel Lab Results  Component Value Date/Time   CHOL 105 04/02/2016 05:23 AM   TRIG 54 04/02/2016 05:23 AM   HDL 51 04/02/2016 05:23 AM   CHOLHDL 2.1 04/02/2016 05:23 AM   LDLCALC 43 04/02/2016 05:23 AM   Dated 01/10/18: cholesterol 145, triglycerides 67, HDL 61, LDL 70. A1c 7.2%. Hgb  11.4. otherwise CBC and CMET normal  Wt Readings from Last 3 Encounters:  09/25/18 178 lb (80.7 kg)  01/01/18 183 lb 12.8 oz (83.4 kg)  07/03/17 179 lb (81.2 kg)     Objective:    Vital Signs:  BP 140/86    Pulse 67    Ht 5\' 9"  (1.753 m)    Wt 178 lb (80.7 kg)    BMI 26.29 kg/m    VITAL SIGNS:  reviewed  ASSESSMENT & PLAN:    1. Coronary disease status post redo CABG in 2005. Remote anterior myocardial infarction. Prior left main stent. Cardiac cath in September 2016 showed patent grafts. He has class 1 angina. Continue medical therapy. Given stent in left main I would continue ASA 81 mg daily even though on eliquis.   2. Chronic systolic/diastolic CHF  Euvolemic based on history and weight.  EF 40-45%.   3. Atrial fibrillation s/p DCCV on 05/05/16. Maintaining NSR.  Mali Vasc score of 6. Will continue Eliquis long term.  4. Hyperlipidemia on Zocor. Excellent control  5. Severe PAD- S/p bilateral femoral endarterectomy and fem-fem bypass. Continue regular aerobic walking. Seen by [facility] in February and LE arterial dopplers  were stable.   6. DM type 2  7. HTN controlled.   COVID-19 Education: The signs and symptoms of COVID-19 were discussed with the patient and how to seek care for testing (follow up with PCP or arrange E-visit).  The importance of social distancing was discussed today.  Time:   Today, I have spent 15 minutes with the patient with telehealth technology discussing the above problems.     Medication Adjustments/Labs and Tests Ordered: Current medicines are reviewed at length with the patient today.  Concerns regarding medicines are outlined above.   Tests Ordered: No orders of the defined types were placed in this encounter.   Medication Changes: No orders of the defined types were placed in this encounter.   Follow Up:  In Person in 6 month(s)  Signed, Dontrelle Mazon Martinique, MD  09/25/2018 3:42 PM    [place]

## 2018-09-24 ENCOUNTER — Telehealth: Payer: Self-pay | Admitting: Cardiology

## 2018-09-24 NOTE — Telephone Encounter (Signed)

## 2018-09-24 NOTE — Telephone Encounter (Signed)
New Message ° ° ° °Left message to confirm appt and get consent  °

## 2018-09-25 ENCOUNTER — Encounter: Payer: Self-pay | Admitting: Cardiology

## 2018-09-25 ENCOUNTER — Telehealth (INDEPENDENT_AMBULATORY_CARE_PROVIDER_SITE_OTHER): Payer: Medicare Other | Admitting: Cardiology

## 2018-09-25 VITALS — BP 140/86 | HR 67 | Ht 69.0 in | Wt 178.0 lb

## 2018-09-25 DIAGNOSIS — I739 Peripheral vascular disease, unspecified: Secondary | ICD-10-CM

## 2018-09-25 DIAGNOSIS — I480 Paroxysmal atrial fibrillation: Secondary | ICD-10-CM

## 2018-09-25 DIAGNOSIS — Z951 Presence of aortocoronary bypass graft: Secondary | ICD-10-CM

## 2018-09-25 DIAGNOSIS — I5042 Chronic combined systolic (congestive) and diastolic (congestive) heart failure: Secondary | ICD-10-CM

## 2018-09-25 DIAGNOSIS — I25810 Atherosclerosis of coronary artery bypass graft(s) without angina pectoris: Secondary | ICD-10-CM

## 2018-09-25 DIAGNOSIS — Z7901 Long term (current) use of anticoagulants: Secondary | ICD-10-CM

## 2018-10-04 ENCOUNTER — Telehealth: Payer: Self-pay | Admitting: Vascular Surgery

## 2018-10-04 NOTE — Telephone Encounter (Signed)
I called the home and mobile # to speak with the patient to R/S his appt from 07/05/18 [facility] to call the office to R/S

## 2018-10-30 ENCOUNTER — Telehealth: Payer: Self-pay | Admitting: Cardiology

## 2018-10-30 NOTE — Telephone Encounter (Signed)
Spoke to patient he stated he will be having 1 tooth extracted or a root canal.He wanted to know if he will need to hold Eliquis.Advised he does not need to hold Eliquis for 1 tooth extraction.I will send message to our pharmacist to see if he needs to hold for a root canal.

## 2018-10-30 NOTE — Telephone Encounter (Signed)
No.  We don't hold for root canals either.  Thanks

## 2018-10-30 NOTE — Telephone Encounter (Signed)
New Message:   Pt is going to the dentist on Friday. He feels like they will probably do a root canal or an extraction. He wants to know if he should stop taking his blood thinner a day or two before his appt?

## 2018-10-30 NOTE — Telephone Encounter (Signed)
Spoke to patient advised he does not need to hold Eliquis for a root canal.

## 2019-05-18 ENCOUNTER — Ambulatory Visit: Payer: Medicare Other | Attending: Internal Medicine

## 2019-05-18 DIAGNOSIS — Z23 Encounter for immunization: Secondary | ICD-10-CM

## 2019-05-18 NOTE — Progress Notes (Signed)
   Covid-19 Vaccination Clinic  Name:  [name]    MRN: [id number] DOB: May 27, 1940  05/18/2019  [name] was observed post Covid-19 immunization for 15 minutes without incidence. He was provided with Vaccine Information Sheet and instruction to access the V-Safe system.   [name] was instructed to call 911 with any severe reactions post vaccine: Marland Kitchen Difficulty breathing  . Swelling of your face and throat  . A fast heartbeat  . A bad rash all over your body  . Dizziness and weakness    Immunizations Administered    Name Date Dose VIS Date Route   Pfizer COVID-19 Vaccine 05/18/2019 12:08 PM 0.3 mL 03/08/2019 Intramuscular   Manufacturer: [place]   Lot: [id number]   [place]: [id number]

## 2019-05-27 ENCOUNTER — Telehealth: Payer: Self-pay | Admitting: *Deleted

## 2019-05-27 NOTE — Telephone Encounter (Signed)
Patient left message asking if he should be concerned if his calf measurements are different.patient has appt with Dr Oneida Alar on 07/04/19 . Called back got the answering machine left a message for patient advised if he has swelling,tightness and his calf is red and warm to touch we would need to see him sooner if not he is ok to been seen at his scheduled appt. Advised him to call back with any changes.

## 2019-05-30 ENCOUNTER — Telehealth: Payer: Self-pay | Admitting: Cardiology

## 2019-05-30 ENCOUNTER — Telehealth: Payer: Self-pay

## 2019-05-30 NOTE — Telephone Encounter (Signed)
Pt called and said that he is having swelling in his legs and they are really tight. No warmth to the touch or pain.   Advised pt to call cardiology and let them know as this may be related to his cardiac issues. He has known history it seems of CHF   York Cerise, [place]

## 2019-05-30 NOTE — Telephone Encounter (Signed)
Spoke with patient. Patient noticed over the last few days that his left lower leg has been swelling. He reports it is swollen and tight. No redness or heat noted. Patient has not seen the vein and vascular specialist in two years.   Patient has not been weighing himself daily nor has he been elevating his legs while sitting. Patient does not own any compression stockings. Patient advised to weigh himself daily at the same time in the same way. He was advised to keep his lower extremities elevated when sitting. Patient to call back if he gains 3lbs overnight or 5lbs in a week.   Will route to MD to see if he advises anything further. Patient has a virtual visit on 06/06/2019.

## 2019-05-30 NOTE — Telephone Encounter (Signed)
Pt c/o swelling: STAT is pt has developed SOB within 24 hours  1) How much weight have you gained and in what time span? None  2) If swelling, where is the swelling located? Lower left leg  3) Are you currently taking a fluid pill? No  4) Are you currently SOB? No  5) Do you have a log of your daily weights (if so, list)? No  6) Have you gained 3 pounds in a day or 5 pounds in a week? No  7) Have you traveled recently? No

## 2019-05-31 NOTE — Telephone Encounter (Signed)
If his leg is swelling that much then he should be seen rather than a virtual visit.  Reeve Turnley Martinique MD, [facility]

## 2019-05-31 NOTE — Telephone Encounter (Signed)
Advised patient and scheduled visit for 3/8 with Blima Ledger NP

## 2019-06-02 NOTE — Progress Notes (Signed)
Cardiology Clinic Note   Patient Name: [name] Date of Encounter: 06/03/2019  Primary Care Provider:  Lawerance Cruel, MD Primary Cardiologist:  Peter Martinique, MD  Patient Profile    [name] 79 year old male presents today for evaluation of his lower extremity edema.  Past Medical History    Past Medical History:  Diagnosis Date  . Acute inferior myocardial infarction (HCC)    hx  . Acute on chronic diastolic CHF (congestive heart failure), NYHA class 1 ([place]) 08/09/2014  . Anemia   . BPH (benign prostatic hyperplasia)   . CAD (coronary artery disease)   . Dyslipidemia   . History of blood transfusion    "I've had one; don't remember when"  . HTN (hypertension)   . Hyponatremia   . 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 Myocardial infarction [facility]) x2  1986, 2005  . 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 PAD (peripheral artery disease) ([place])   . Type II diabetes mellitus ([place])    Past Surgical History:  Procedure Laterality Date  . CARDIAC CATHETERIZATION  12/23/2014   Procedure: Left Heart Cath and Cors/Grafts Angiography;  Surgeon: Peter M Martinique, MD;  Location: [place] [facility];  Service: Cardiovascular;;  . CARDIOVERSION N/A 05/05/2016   Procedure: CARDIOVERSION;  Surgeon: Lelon Perla, MD;  Location: [facility];  Service: Cardiovascular;  Laterality: N/A;  . CATARACT EXTRACTION W/ INTRAOCULAR LENS  IMPLANT, BILATERAL Bilateral   . CORONARY ANGIOPLASTY WITH STENT PLACEMENT    . CORONARY ARTERY BYPASS GRAFT  1986; redo 2005   ; free Rima to OM, svg-diag,svg-pda  . ENDARTERECTOMY FEMORAL Bilateral 12/31/2014   Procedure: BILATERAL FEMORAL ENDARTERECTOMY;  Surgeon: Elam Dutch, MD;  Location: [place];  Service: Vascular;  Laterality: Bilateral;  . FEMORAL-FEMORAL BYPASS GRAFT Bilateral 12/31/2014   Procedure: FEMORAL-FEMORAL ARTERY BYPASS GRAFT;  Surgeon: Elam Dutch, MD;  Location: [place];  Service: Vascular;  Laterality:  Bilateral;  . KYPHOPLASTY  02/29/2012   Procedure: KYPHOPLASTY;  Surgeon: Sinclair Ship, MD;  Location: [place];  Service: Orthopedics;  Laterality: Bilateral;  T 10 kyphoplasty  . LAPAROTOMY N/A 08/02/2014   Procedure: EXPLORATORY LAPAROTOMY LYSIS OF ADHESIONS;  Surgeon: Donnie Mesa, MD;  Location: [place];  Service: General;  Laterality: N/A;  . MELANOMA EXCISION     scalp  . PERIPHERAL VASCULAR CATHETERIZATION N/A 12/05/2014   Procedure: Abdominal Aortogram;  Surgeon: Elam Dutch, MD;  Location: [place] [facility];  Service: Cardiovascular;  Laterality: N/A;  . ROTATOR CUFF REPAIR Right   . TONSILLECTOMY      Allergies  Allergies  Allergen Reactions  . Hctz [Hydrochlorothiazide] Other (See Comments)    Hypokalemia     History of Present Illness    [name] has a PMH of coronary artery disease status post redo coronary artery bypass in 2005 after emergent stenting of his left main.  His surgery included a free RIMA graft to the OM, SVG to diagonal, and SVG to PDA.  His LIMA to LAD or intact from his original surgery.  He has also had an old anterior MI.  April 2016 he was admitted with shortness of breath and underwent surgery with exploratory lap and lysis of adhesions.  His postop course was complicated by marked volume overload due to IV fluids with an over 20 pound weight gain.  He was diuresed.  His echocardiogram showed an EF of 45%.  In August 2016  he presented with progressive claudication in his left hip and leg.  Dopplers showed severe PAD.  Angiography was completed by Dr. Oneida Alar and showed severe left iliac and bilateral common femoral disease.  He had an abnormal Myoview study and cardiac catheterization was done which showed patent grafts.  He underwent surgery by Dr. Oneida Alar with bilateral femoral endarterectomy and femorofemoral BPG.  He was admitted 1 /4-1/ 7/18 with a URI, CHF, new diagnosis of atrial fibrillation.  He was rate controlled with carvedilol and  digoxin.  Bradycardia with Cardizem.  He was started on Eliquis and Plavix was continued.  His TSH was normal, EF 40-45% his CHA2DS2-VASc score was 6.  (Age x2, hypertension, diabetes, CAD, CHF).  He was anticoagulated for 4 weeks and underwent DCCV 05/05/2016.  Losartan was held for elevated creatinine.  He later returned to normal and his losartan was resumed at 50 mg daily.  His last seen by Dr. Martinique on 09/25/2018.  During that time he was doing well.  He was able to walk up to 45 minutes at a time without chest discomfort, dyspnea, or significant claudication.  He did have some weakness in his left leg but denied edema and palpitations.  He contacted nurse triage line on 05/30/2019 and indicated that he had lower extremity swelling and tightness.  He had not been weighing himself daily or elevating his lower extremities.  He was instructed to start daily weights at that time.  He presents today for follow-up and states for the last 2 weeks he has had increased lower extremity swelling.  His left lower extremity is larger than his right.  He has not been weighing daily, or eating a low-salt diet.  He states that he received a Covid shot about 12 days ago and is wondering if it is related to the injection.  He continues to walk for 20 minutes couple times a week.  I will give him diagnosis x3 days with supplemental potassium.  Have him follow-up in 1 week for repeat evaluation and lab work.  Today he denies chest pain, shortness of breath, lower extremity edema, fatigue, palpitations, melena, hematuria, hemoptysis, diaphoresis, weakness, presyncope, syncope, orthopnea, and PND.  Home Medications    Prior to Admission medications   Medication Sig Start Date End Date Taking? Authorizing Provider  ALPRAZolam (XANAX) 0.5 MG tablet TAKE 1 TABLET ONCE A DAY FOR 30 DAYS 11/30/17   [provider]  apixaban (ELIQUIS) 5 MG TABS tablet Take 1 tablet (5 mg total) by mouth 2 (two) times daily. 04/03/16    Regalado, Belkys A, MD  aspirin 81 MG tablet Take 81 mg by mouth daily.      [provider]  carvedilol (COREG) 12.5 MG tablet TAKE 1 TABLET TWICE A DAY 02/22/11   Martinique, Peter M, MD  digoxin (LANOXIN) 0.125 MG tablet Take 0.125 mg by mouth every other day.     [provider]  glimepiride (AMARYL) 4 MG tablet Take 4 mg by mouth daily at 2 PM.     [provider]  iron polysaccharides (NIFEREX) 150 MG capsule Take 150 mg by mouth at bedtime.     [provider]  losartan (COZAAR) 50 MG tablet Take 1 tablet (50 mg total) by mouth daily. 05/06/16 01/02/17  Martinique, Peter M, MD  nitroGLYCERIN (NITROSTAT) 0.4 MG SL tablet PLACE 1 TABLET (0.4 MG TOTAL) UNDER THE TONGUE EVERY 5 (FIVE) MINUTES AS NEEDED FOR CHEST PAIN. 01/04/16   Martinique, Peter M, MD  pioglitazone (ACTOS) 45 MG tablet Take 45 mg by mouth daily.  04/13/12   [provider]  simvastatin (ZOCOR) 20 MG tablet Take 1 tablet (20 mg total) by mouth at bedtime. 10/21/15   Martinique, Peter M, MD    Family History    Family History  Problem Relation Age of Onset  . Dementia Mother    He indicated that his mother is deceased. He indicated that his father is deceased. He indicated that his sister is alive. He indicated that only one of his two brothers is alive. He indicated that his maternal grandmother is deceased. He indicated that his maternal grandfather is deceased. He indicated that his paternal grandmother is deceased. He indicated that his paternal grandfather is deceased.  Social History    Social History   Socioeconomic History  . Marital status: Married    Spouse name: Not on file  . Number of children: 2  . Years of education: Not on file  . Highest education level: Not on file  Occupational History  . Occupation: Optician, dispensing: RETIRED  Tobacco Use  . Smoking status: Former Smoker    Packs/day: 2.00    Years: 25.00    Pack years: 50.00    Types: Cigarettes    Quit date:  02/22/1986    Years since quitting: 33.2  . Smokeless tobacco: Never Used  Substance and Sexual Activity  . Alcohol use: Yes    Alcohol/week: 6.0 standard drinks    Types: 6 Cans of beer per week  . Drug use: No  . Sexual activity: Not Currently  Other Topics Concern  . Not on file  Social History Narrative  . Not on file   Social Determinants of Health   Financial Resource Strain:   . Difficulty of Paying Living Expenses: Not on file  Food Insecurity:   . Worried About Charity fundraiser in the Last Year: Not on file  . Ran Out of Food in the Last Year: Not on file  Transportation Needs:   . Lack of Transportation (Medical): Not on file  . Lack of Transportation (Non-Medical): Not on file  Physical Activity:   . Days of Exercise per Week: Not on file  . Minutes of Exercise per Session: Not on file  Stress:   . Feeling of Stress : Not on file  Social Connections:   . Frequency of Communication with Friends and Family: Not on file  . Frequency of Social Gatherings with Friends and Family: Not on file  . Attends Religious Services: Not on file  . Active Member of Clubs or Organizations: Not on file  . Attends Archivist Meetings: Not on file  . Marital Status: Not on file  Intimate Partner Violence:   . Fear of Current or Ex-Partner: Not on file  . Emotionally Abused: Not on file  . Physically Abused: Not on file  . Sexually Abused: Not on file     Review of Systems    General:  No chills, fever, night sweats or weight changes.  Cardiovascular:  No chest pain, dyspnea on exertion, edema, orthopnea, palpitations, paroxysmal nocturnal dyspnea. Dermatological: No rash, lesions/masses Respiratory: No cough, dyspnea Urologic: No hematuria, dysuria Abdominal:   No nausea, vomiting, diarrhea, bright red blood per rectum, melena, or hematemesis Neurologic:  No visual changes, wkns, changes in mental status. All other systems reviewed and are otherwise negative  except as noted above.  Physical Exam    VS:  BP  110/72 (BP Location: Left Arm, Patient Position: Sitting, Cuff Size: Normal)   Pulse 62   Ht 5\' 9"  (1.753 m)   Wt 187 lb (84.8 kg)   SpO2 98%   BMI 27.62 kg/m  , BMI Body mass index is 27.62 kg/m. GEN: Well nourished, well developed, in no acute distress. HEENT: normal. Neck: Supple, no JVD, carotid bruits, or masses. Cardiac: RRR, no murmurs, rubs, or gallops. No clubbing, cyanosis, edema.  Radials/DP/PT 2+ and equal bilaterally.  Respiratory:  Respirations regular and unlabored, clear to auscultation bilaterally. GI: Soft, nontender, nondistended, BS + x 4. MS: no deformity or atrophy. Skin: warm and dry, no rash. Neuro:  Strength and sensation are intact. Psych: Normal affect.  Accessory Clinical Findings    ECG personally reviewed by me today-none today.  EKG 07/03/2017 Sinus rhythm first-degree AV block 68 bpm  Echocardiogram 04/02/2016 Study Conclusions   - Left ventricle: The cavity size was mildly dilated. Wall  thickness was normal. Systolic function was mildly to moderately  reduced. The estimated ejection fraction was in the range of 40%  to 45%. There is akinesis of the inferolateral myocardium.  - Mitral valve: Calcified annulus. There was mild regurgitation.  - Left atrium: The atrium was moderately dilated.  - Pulmonary arteries: Systolic pressure was moderately increased.  PA peak pressure: 51 mm Hg (S).   Impressions:   - Akinesis of the inferolateral wall with overall mild to moderate  LV dysfunction; mild MR; moderate LAE; mild TR; moderately  elevated pulmonary pressure.    Assessment & Plan   1.  Lower extremity edema/ chronic combined systolic and diastolic CHF -bilateral lower extremity +2 pitting edema.  Weight today 187 pounds. Lower extremity support stockings Elevate extremities when not active Increase physical activity as tolerated-15 minutes daily walking. Daily weights Heart  healthy low-sodium diet-salty 6 given Lasix 20 mg x 3 days then stop Potassium 10 mEq x 3 days then stop Ordered BMP in 1 week  Coronary artery disease-no chest pain today.  Redo CABG in 2005 after emergent stenting of his left main.  His surgery included a free RIMA graft to the OM, SVG to diagonal, and SVG to PDA.  His LIMA to LAD or intact from his original surgery.  He has also had an old anterior MI. Continue aspirin 81 mg tablet daily Continue carvedilol 12.5 mg twice daily Continue digoxin 0.125 mg every other day Continue losartan 50 mg tablet daily Continue nitroglycerin 0.4 mg sublingual as needed Continue simvastatin 20 mg tablet daily Heart healthy low-sodium diet Increase physical activity as tolerated  Atrial fibrillation-heart rate today 62.  Status post successful DCCV 05/05/2016.  CHA2DS2-VASc score 6 Continue apixaban 5 mg twice daily  Hyperlipidemia-LDL 43 04/02/2016 Continue simvastatin 20 mg tablet daily Heart healthy low-sodium high-fiber diet Increase physical activity as tolerated  Disposition follow-up with me in 1 week.  Jossie Ng. [facility] [address] Office [phone] Fax [phone]

## 2019-06-03 ENCOUNTER — Other Ambulatory Visit: Payer: Self-pay

## 2019-06-03 ENCOUNTER — Encounter: Payer: Self-pay | Admitting: General Practice

## 2019-06-03 ENCOUNTER — Ambulatory Visit (INDEPENDENT_AMBULATORY_CARE_PROVIDER_SITE_OTHER): Payer: Medicare Other | Admitting: General Practice

## 2019-06-03 ENCOUNTER — Ambulatory Visit: Payer: Medicare Other | Admitting: Cardiology

## 2019-06-03 VITALS — BP 110/72 | HR 62 | Ht 69.0 in | Wt 187.0 lb

## 2019-06-03 DIAGNOSIS — Z79899 Other long term (current) drug therapy: Secondary | ICD-10-CM

## 2019-06-03 DIAGNOSIS — I5042 Chronic combined systolic (congestive) and diastolic (congestive) heart failure: Secondary | ICD-10-CM

## 2019-06-03 DIAGNOSIS — I25810 Atherosclerosis of coronary artery bypass graft(s) without angina pectoris: Secondary | ICD-10-CM

## 2019-06-03 DIAGNOSIS — I10 Essential (primary) hypertension: Secondary | ICD-10-CM

## 2019-06-03 DIAGNOSIS — I480 Paroxysmal atrial fibrillation: Secondary | ICD-10-CM

## 2019-06-03 DIAGNOSIS — R600 Localized edema: Secondary | ICD-10-CM | POA: Diagnosis not present

## 2019-06-03 MED ORDER — FUROSEMIDE 20 MG PO TABS: 20.0000 mg | ORAL_TABLET | Freq: Every day | ORAL | 0 refills | Status: DC

## 2019-06-03 MED ORDER — POTASSIUM CHLORIDE ER 10 MEQ PO TBCR: 10.0000 meq | EXTENDED_RELEASE_TABLET | Freq: Every day | ORAL | 0 refills | Status: DC

## 2019-06-03 NOTE — Patient Instructions (Addendum)
Medication Instructions:  TAKE FUROSEMIDE 20MG  DAILY x3 DAYS THEN STOP  TAKE POTASSIUM 10MEQ DAILY x3 DAYS THEN STOP  If you need a refill on your cardiac medications before your next appointment, please call your pharmacy.  Labwork: BMET 3 DAYS BEFORE FOLLOW UP APPOINTMENT HERE IN OUR OFFICE AT [facility]     You will NOT need to fast   If you have labs (blood work) drawn today and your tests are completely normal, you will receive your results only by: Marland Kitchen MyChart Message (if you have MyChart) OR A paper copy in the mail If you have any lab test that is abnormal or we need to change your treatment, we will call you to review these results. You may go to any [facility] lab that is convenient for you however, we do have a lab in our office that is able to assist you. You do NOT need an appointment for our lab. Once in our office in our office lobby there is a podium where you sign-in and ring the doorbell to alert [place] that you are here. Lab is open 8:00am and closes at 4:00pm; closes for lunch from 12:45 - 1:45pm. PLEASE BRING A COPY OF YOUR INSURANCE CARD WITH YOU.  Special Instructions: TAKE AND LOG WEIGHT DAILY AND BRING WITH YOU TO FOLLOW UP APPT  PLEASE PURCHASE AND WEAR COMPRESSION STOCKINGS DAILY AND OFF AT BEDTIME. Compression stockings are elastic socks that squeeze the legs. They help to increase blood flow to the legs and to decrease swelling in the legs from fluid retention, and reduce the chance of developing blood clots in the lower legs.   PLEASE READ AND FOLLOW SALTY 6 ATTACHED  Follow-Up: 1 WEEK  In Person JESSE CLEAVER FNP-C.    At [facility], you and your health needs are our priority.  As part of our continuing mission to provide you with exceptional heart care, we have created designated Provider Care Teams.  These Care Teams include your primary Cardiologist (physician) and Advanced Practice Providers (APPs -  Physician Assistants and Nurse Practitioners) who all work  together to provide you with the care you need, when you need it.  Reduce your risk of getting COVID-19 With your heart disease it is especially important for people at increased risk of severe illness from COVID-19, and those who live with them, to protect themselves from getting COVID-19. The best way to protect yourself and to help reduce the spread of the virus that causes COVID-19 is to: Marland Kitchen Limit your interactions with other people as much as possible. . Take COVID-19 when you do interact with others. If you start feeling sick and think you may have COVID-19, get in touch with your healthcare provider within 24 hours.  Thank you for choosing [facility] at [facility]!!

## 2019-06-06 ENCOUNTER — Telehealth: Payer: Medicare Other | Admitting: Cardiology

## 2019-06-08 LAB — BASIC METABOLIC PANEL
BUN/Creatinine Ratio: 13 (ref 10–24)
BUN: 16 mg/dL (ref 8–27)
CO2: 22 mmol/L (ref 20–29)
Calcium: 9.6 mg/dL (ref 8.6–10.2)
Chloride: 103 mmol/L (ref 96–106)
Creatinine, Ser: 1.24 mg/dL (ref 0.76–1.27)
GFR calc Af Amer: 64 mL/min/{1.73_m2} (ref >59)
GFR calc non Af Amer: 55 mL/min/{1.73_m2} — ABNORMAL LOW (ref >59)
Glucose: 108 mg/dL — ABNORMAL HIGH (ref 65–99)
Potassium: 4.3 mmol/L (ref 3.5–5.2)
Sodium: 142 mmol/L (ref 134–144)

## 2019-06-09 NOTE — Progress Notes (Signed)
Cardiology Clinic Note   Patient Name: [name] Date of Encounter: 06/10/2019  Primary Care Provider:  Lawerance Cruel, MD Primary Cardiologist:  Peter Martinique, MD  Patient Profile    [name] 79 year old male presents today for evaluation of his lower extremity edema.  Past Medical History    Past Medical History:  Diagnosis Date  . Acute inferior myocardial infarction (HCC)    hx  . Acute on chronic diastolic CHF (congestive heart failure), NYHA class 1 ([place]) 08/09/2014  . Anemia   . BPH (benign prostatic hyperplasia)   . CAD (coronary artery disease)   . Dyslipidemia   . History of blood transfusion    "I've had one; don't remember when"  . HTN (hypertension)   . Hyponatremia   . 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 Myocardial infarction [facility]) x2  1986, 2005  . 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 PAD (peripheral artery disease) ([place])   . Type II diabetes mellitus ([place])    Past Surgical History:  Procedure Laterality Date  . CARDIAC CATHETERIZATION  12/23/2014   Procedure: Left Heart Cath and Cors/Grafts Angiography;  Surgeon: Peter M Martinique, MD;  Location: [place] CV [facility];  Service: Cardiovascular;;  . CARDIOVERSION N/A 05/05/2016   Procedure: CARDIOVERSION;  Surgeon: Lelon Perla, MD;  Location: [facility] ENDOSCOPY;  Service: Cardiovascular;  Laterality: N/A;  . CATARACT EXTRACTION W/ INTRAOCULAR LENS  IMPLANT, BILATERAL Bilateral   . CORONARY ANGIOPLASTY WITH STENT PLACEMENT    . CORONARY ARTERY BYPASS GRAFT  1986; redo 2005   ; free Rima to OM, svg-diag,svg-pda  . ENDARTERECTOMY FEMORAL Bilateral 12/31/2014   Procedure: BILATERAL FEMORAL ENDARTERECTOMY;  Surgeon: Elam Dutch, MD;  Location: [place];  Service: Vascular;  Laterality: Bilateral;  . FEMORAL-FEMORAL BYPASS GRAFT Bilateral 12/31/2014   Procedure: FEMORAL-FEMORAL ARTERY BYPASS GRAFT;  Surgeon: Elam Dutch, MD;  Location: [place];  Service: Vascular;  Laterality:  Bilateral;  . KYPHOPLASTY  02/29/2012   Procedure: KYPHOPLASTY;  Surgeon: Sinclair Ship, MD;  Location: [place];  Service: Orthopedics;  Laterality: Bilateral;  T 10 kyphoplasty  . LAPAROTOMY N/A 08/02/2014   Procedure: EXPLORATORY LAPAROTOMY LYSIS OF ADHESIONS;  Surgeon: Donnie Mesa, MD;  Location: [place];  Service: General;  Laterality: N/A;  . MELANOMA EXCISION     scalp  . PERIPHERAL VASCULAR CATHETERIZATION N/A 12/05/2014   Procedure: Abdominal Aortogram;  Surgeon: Elam Dutch, MD;  Location: [place] [facility];  Service: Cardiovascular;  Laterality: N/A;  . ROTATOR CUFF REPAIR Right   . TONSILLECTOMY      Allergies  Allergies  Allergen Reactions  . Hctz [Hydrochlorothiazide] Other (See Comments)    Hypokalemia     History of Present Illness    [name] has a PMH of coronary artery disease status post redo coronary artery bypass in 2005 after emergent stenting of his left main.  His surgery included a free RIMA graft to the OM, SVG to diagonal, and SVG to PDA.  His LIMA to LAD or intact from his original surgery.  He has also had an old anterior MI.  April 2016 he was admitted with shortness of breath and underwent surgery with exploratory lap and lysis of adhesions.  His postop course was complicated by marked volume overload due to IV fluids with an over 20 pound weight gain.  He was diuresed.  His echocardiogram showed an EF of 45%.  In August 2016  he presented with progressive claudication in his left hip and leg.  Dopplers showed severe PAD.  Angiography was completed by Dr. Oneida Alar and showed severe left iliac and bilateral common femoral disease.  He had an abnormal Myoview study and cardiac catheterization was done which showed patent grafts.  He underwent surgery by Dr. Oneida Alar with bilateral femoral endarterectomy and femorofemoral BPG.  He was admitted 1 /4-1/ 7/18 with a URI, CHF, new diagnosis of atrial fibrillation.  He was rate controlled with carvedilol and  digoxin.  Bradycardia with Cardizem.  He was started on Eliquis and Plavix was continued.  His TSH was normal, EF 40-45% his CHA2DS2-VASc score was 6.  (Age x2, hypertension, diabetes, CAD, CHF).  He was anticoagulated for 4 weeks and underwent DCCV 05/05/2016.  Losartan was held for elevated creatinine.  He later returned to normal and his losartan was resumed at 50 mg daily.  His last seen by Dr. Martinique on 09/25/2018.  During that time he was doing well.  He was able to walk up to 45 minutes at a time without chest discomfort, dyspnea, or significant claudication.  He did have some weakness in his left leg but denied edema and palpitations.  He contacted nurse triage line on 05/30/2019 and indicated that he had lower extremity swelling and tightness.  He had not been weighing himself daily or elevating his lower extremities.  He was instructed to start daily weights at that time.  He presented 06/03/2019  for follow-up and stated for the past 2 weeks he  had increased lower extremity swelling.  His left lower extremity was larger than his right.  He had not been weighing daily, or eating a low-salt diet.  He stated that he received a Covid shot about 12 days ago and was wondering if it was related to the injection.  He continued to walk for 20 minutes a couple times a week.   He presents the clinic today for follow-up and states he is starting to feel better.  He has lost 6 pounds over the last week.  He has had no side effects with the furosemide or potassium.  He does feel he has quite a bit of lower extremity edema.  He has been following a low-sodium diet and is now wearing lower extremity compression stockings.  I will continue his furosemide 20 mg daily and 10 mEq of potassium.  I will have him follow-up in 2 weeks for repeat BMP and decide at that time whether he will need to take furosemide daily.  Today he denies chest pain, shortness of breath, increased lower extremity edema, fatigue,  palpitations, melena, hematuria, hemoptysis, diaphoresis, weakness, presyncope, syncope, orthopnea, and PND.  Home Medications    Prior to Admission medications   Medication Sig Start Date End Date Taking? Authorizing Provider  ALPRAZolam (XANAX) 0.5 MG tablet TAKE 1 TABLET ONCE A DAY FOR 30 DAYS 11/30/17   [provider]  apixaban (ELIQUIS) 5 MG TABS tablet Take 1 tablet (5 mg total) by mouth 2 (two) times daily. 04/03/16   Regalado, Belkys A, MD  aspirin 81 MG tablet Take 81 mg by mouth daily.      [provider]  carvedilol (COREG) 12.5 MG tablet TAKE 1 TABLET TWICE A DAY 02/22/11   Martinique, Peter M, MD  digoxin (LANOXIN) 0.125 MG tablet Take 0.125 mg by mouth every other day.     [provider]  furosemide (LASIX) 20 MG tablet Take 1 tablet (20 mg total)  by mouth daily. 06/03/19 09/01/19  Deberah Pelton, NP  glimepiride (AMARYL) 4 MG tablet Take 4 mg by mouth daily at 2 PM.     [provider]  iron polysaccharides (NIFEREX) 150 MG capsule Take 150 mg by mouth at bedtime.     [provider]  losartan (COZAAR) 50 MG tablet Take 1 tablet (50 mg total) by mouth daily. 05/06/16 01/02/17  Martinique, Peter M, MD  nitroGLYCERIN (NITROSTAT) 0.4 MG SL tablet PLACE 1 TABLET (0.4 MG TOTAL) UNDER THE TONGUE EVERY 5 (FIVE) MINUTES AS NEEDED FOR CHEST PAIN. 01/04/16   Martinique, Peter M, MD  pioglitazone (ACTOS) 45 MG tablet Take 45 mg by mouth daily.  04/13/12   [provider]  potassium chloride (KLOR-CON) 10 MEQ tablet Take 1 tablet (10 mEq total) by mouth daily. 06/03/19 09/01/19  Deberah Pelton, NP  simvastatin (ZOCOR) 20 MG tablet Take 1 tablet (20 mg total) by mouth at bedtime. 10/21/15   Martinique, Peter M, MD    Family History    Family History  Problem Relation Age of Onset  . Dementia Mother    He indicated that his mother is deceased. He indicated that his father is deceased. He indicated that his sister is alive. He indicated that only one of his two  brothers is alive. He indicated that his maternal grandmother is deceased. He indicated that his maternal grandfather is deceased. He indicated that his paternal grandmother is deceased. He indicated that his paternal grandfather is deceased.  Social History    Social History   Socioeconomic History  . Marital status: Married    Spouse name: Not on file  . Number of children: 2  . Years of education: Not on file  . Highest education level: Not on file  Occupational History  . Occupation: Optician, dispensing: RETIRED  Tobacco Use  . Smoking status: Former Smoker    Packs/day: 2.00    Years: 25.00    Pack years: 50.00    Types: Cigarettes    Quit date: 02/22/1986    Years since quitting: 33.3  . Smokeless tobacco: Never Used  Substance and Sexual Activity  . Alcohol use: Yes    Alcohol/week: 6.0 standard drinks    Types: 6 Cans of beer per week  . Drug use: No  . Sexual activity: Not Currently  Other Topics Concern  . Not on file  Social History Narrative  . Not on file   Social Determinants of Health   Financial Resource Strain:   . Difficulty of Paying Living Expenses:   Food Insecurity:   . Worried About Charity fundraiser in the Last Year:   . Arboriculturist in the Last Year:   Transportation Needs:   . Film/video editor (Medical):   Marland Kitchen Lack of Transportation (Non-Medical):   Physical Activity:   . Days of Exercise per Week:   . Minutes of Exercise per Session:   Stress:   . Feeling of Stress :   Social Connections:   . Frequency of Communication with Friends and Family:   . Frequency of Social Gatherings with Friends and Family:   . Attends Religious Services:   . Active Member of Clubs or Organizations:   . Attends Archivist Meetings:   Marland Kitchen Marital Status:   Intimate Partner Violence:   . Fear of Current or Ex-Partner:   . Emotionally Abused:   Marland Kitchen Physically Abused:   . Sexually Abused:  Review of Systems    General:  No  chills, fever, night sweats or weight changes.  Cardiovascular:  No chest pain, dyspnea on exertion, edema, orthopnea, palpitations, paroxysmal nocturnal dyspnea. Dermatological: No rash, lesions/masses Respiratory: No cough, dyspnea Urologic: No hematuria, dysuria Abdominal:   No nausea, vomiting, diarrhea, bright red blood per rectum, melena, or hematemesis Neurologic:  No visual changes, wkns, changes in mental status. All other systems reviewed and are otherwise negative except as noted above.  Physical Exam    VS:  BP 104/76   Pulse (!) 58   Ht 5\' 9"  (1.753 m)   Wt 181 lb (82.1 kg)   SpO2 100%   BMI 26.73 kg/m  , BMI Body mass index is 26.73 kg/m. GEN: Well nourished, well developed, in no acute distress. HEENT: normal. Neck: Supple, no JVD, carotid bruits, or masses. Cardiac: RRR, no murmurs, rubs, or gallops. No clubbing, cyanosis, edema.  Radials/DP/PT 2+ and equal bilaterally.  Respiratory:  Respirations regular and unlabored, clear to auscultation bilaterally. GI: Soft, nontender, nondistended, BS + x 4. MS: no deformity or atrophy. Skin: warm and dry, no rash. Neuro:  Strength and sensation are intact. Psych: Normal affect.  Accessory Clinical Findings    ECG personally reviewed by me today-atrial fibrillation with slow ventricular response nonspecific T wave abnormality 58 bpm.  EKG 07/03/2017 Sinus rhythm first-degree AV block 68 bpm  Echocardiogram 04/02/2016 Study Conclusions   - Left ventricle: The cavity size was mildly dilated. Wall  thickness was normal. Systolic function was mildly to moderately  reduced. The estimated ejection fraction was in the range of 40%  to 45%. There is akinesis of the inferolateral myocardium.  - Mitral valve: Calcified annulus. There was mild regurgitation.  - Left atrium: The atrium was moderately dilated.  - Pulmonary arteries: Systolic pressure was moderately increased.  PA peak pressure: 51 mm Hg (S).    Impressions:   - Akinesis of the inferolateral wall with overall mild to moderate  LV dysfunction; mild MR; moderate LAE; mild TR; moderately  elevated pulmonary pressure.   Assessment & Plan   1.Lower extremity edema/ chronic combined systolic and diastolic CHF -bilateral lower extremity +2 pitting edema.  Weight today 181 pounds down from 187 pounds 1 week ago. Lower extremity support stockings Elevate extremities when not active Increase physical activity as tolerated-15 minutes daily walking. Daily weights Heart healthy low-sodium diet-salty 6 reviewed Continue Lasix 20 mg daily Continue potassium 10 mEq daily   Coronary artery disease-no chest pain today.  Redo CABG in 2005 after emergent stenting of his left main.  His surgery included a free RIMA graft to the OM, SVG to diagonal, and SVG to PDA.  His LIMA to LAD or intact from his original surgery.  He has also had an old anterior MI. Continue aspirin 81 mg tablet daily Continue carvedilol 12.5 mg twice daily Continue digoxin 0.125 mg every other day Continue losartan 50 mg tablet daily Continue nitroglycerin 0.4 mg sublingual as needed Continue simvastatin 20 mg tablet daily Heart healthy low-sodium diet Increase physical activity as tolerated  Atrial fibrillation-heart rate today 58.  Status post successful DCCV 05/05/2016.  CHA2DS2-VASc score 6 Continue apixaban 5 mg twice daily  Hyperlipidemia-LDL 43 04/02/2016 Continue simvastatin 20 mg tablet daily Heart healthy low-sodium high-fiber diet Increase physical activity as tolerated  Disposition follow-up with me in 2 weeks.  Jossie Ng. [facility] [address] [address] Office [phone]  Fax [phone]

## 2019-06-10 ENCOUNTER — Other Ambulatory Visit: Payer: Self-pay

## 2019-06-10 ENCOUNTER — Ambulatory Visit: Payer: Medicare Other | Admitting: General Practice

## 2019-06-10 ENCOUNTER — Encounter: Payer: Self-pay | Admitting: General Practice

## 2019-06-10 VITALS — BP 104/76 | HR 58 | Ht 69.0 in | Wt 181.0 lb

## 2019-06-10 DIAGNOSIS — I5042 Chronic combined systolic (congestive) and diastolic (congestive) heart failure: Secondary | ICD-10-CM | POA: Diagnosis not present

## 2019-06-10 DIAGNOSIS — E785 Hyperlipidemia, unspecified: Secondary | ICD-10-CM

## 2019-06-10 DIAGNOSIS — R600 Localized edema: Secondary | ICD-10-CM

## 2019-06-10 DIAGNOSIS — I25810 Atherosclerosis of coronary artery bypass graft(s) without angina pectoris: Secondary | ICD-10-CM

## 2019-06-10 DIAGNOSIS — I4891 Unspecified atrial fibrillation: Secondary | ICD-10-CM

## 2019-06-10 MED ORDER — FUROSEMIDE 20 MG PO TABS: 20.0000 mg | ORAL_TABLET | Freq: Every day | ORAL | 3 refills | Status: DC

## 2019-06-10 NOTE — Patient Instructions (Signed)
Medication Instructions:  Your physician has recommended you make the following change in your medication:  1. LASIX 20 MG DAILY FOR 2 WEEKS  2. CONTINUE POTASSIUM 10 MEQ DAILY ALONG WITH LASIX    *If you need a refill on your cardiac medications before your next appointment, please call your pharmacy*   Lab Work: TO BE DONE IN 2 WEEKS: BMP  If you have labs (blood work) drawn today and your tests are completely normal, you will receive your results only by: Marland Kitchen MyChart Message (if you have MyChart) OR . A paper copy in the mail If you have any lab test that is abnormal or we need to change your treatment, we will call you to review the results.   Testing/Procedures: NONE  Follow-Up: At [facility], you and your health needs are our priority.  As part of our continuing mission to provide you with exceptional heart care, we have created designated Provider Care Teams.  These Care Teams include your primary Cardiologist (physician) and Advanced Practice Providers (APPs -  Physician Assistants and Nurse Practitioners) who all work together to provide you with the care you need, when you need it.  We recommend signing up for the patient portal called "MyChart".  Sign up information is provided on this After Visit Summary.  MyChart is used to connect with patients for Virtual Visits (Telemedicine).  Patients are able to view lab/test results, encounter notes, upcoming appointments, etc.  Non-urgent messages can be sent to your provider as well.   To learn more about what you can do with MyChart, go to [email or web address].    Your next appointment:   2 week(s)  The format for your next appointment:   In Person  Provider:   Coletta Memos, FNP   Other Instructions

## 2019-06-11 ENCOUNTER — Telehealth: Payer: Self-pay | Admitting: Cardiology

## 2019-06-11 ENCOUNTER — Ambulatory Visit: Payer: Medicare Other | Attending: Internal Medicine

## 2019-06-11 DIAGNOSIS — Z23 Encounter for immunization: Secondary | ICD-10-CM

## 2019-06-11 MED ORDER — POTASSIUM CHLORIDE ER 10 MEQ PO TBCR: EXTENDED_RELEASE_TABLET | ORAL | 0 refills | Status: DC

## 2019-06-11 NOTE — Telephone Encounter (Signed)
New Message:     Pt wants to know if he is supposed to be sstill taking the Potassium? If so, he needs it called in, if not please let him know.

## 2019-06-11 NOTE — Telephone Encounter (Signed)
Pt needed additional klor con 10 meq  #14  1 tab for 2 weeks  sent to pharmacy  Refill sent as requested ./cy

## 2019-06-11 NOTE — Progress Notes (Signed)
   Covid-19 Vaccination Clinic  Name:  [name]    MRN: [id number] DOB: 06/02/1940  06/11/2019  [name] was observed post Covid-19 immunization for 15 minutes without incident. He was provided with Vaccine Information Sheet and instruction to access the V-Safe system.   [name] was instructed to call 911 with any severe reactions post vaccine: Marland Kitchen Difficulty breathing  . Swelling of face and throat  . A fast heartbeat  . A bad rash all over body  . Dizziness and weakness   Immunizations Administered    Name Date Dose VIS Date Route   Pfizer COVID-19 Vaccine 06/11/2019 12:17 PM 0.3 mL 03/08/2019 Intramuscular   Manufacturer: [place]   Lot: [id number]   [place]: [id number]

## 2019-06-25 ENCOUNTER — Other Ambulatory Visit: Payer: Self-pay

## 2019-06-25 ENCOUNTER — Telehealth: Payer: Self-pay

## 2019-06-25 ENCOUNTER — Ambulatory Visit: Payer: Medicare Other | Admitting: Cardiology

## 2019-06-25 ENCOUNTER — Encounter: Payer: Self-pay | Admitting: Cardiology

## 2019-06-25 VITALS — BP 130/62 | HR 62 | Temp 98.2°F | Ht 69.0 in | Wt 177.2 lb

## 2019-06-25 DIAGNOSIS — R600 Localized edema: Secondary | ICD-10-CM | POA: Diagnosis not present

## 2019-06-25 DIAGNOSIS — I480 Paroxysmal atrial fibrillation: Secondary | ICD-10-CM

## 2019-06-25 DIAGNOSIS — I10 Essential (primary) hypertension: Secondary | ICD-10-CM

## 2019-06-25 DIAGNOSIS — E785 Hyperlipidemia, unspecified: Secondary | ICD-10-CM

## 2019-06-25 DIAGNOSIS — I739 Peripheral vascular disease, unspecified: Secondary | ICD-10-CM

## 2019-06-25 DIAGNOSIS — I4891 Unspecified atrial fibrillation: Secondary | ICD-10-CM

## 2019-06-25 DIAGNOSIS — E1142 Type 2 diabetes mellitus with diabetic polyneuropathy: Secondary | ICD-10-CM

## 2019-06-25 DIAGNOSIS — Z951 Presence of aortocoronary bypass graft: Secondary | ICD-10-CM

## 2019-06-25 DIAGNOSIS — I429 Cardiomyopathy, unspecified: Secondary | ICD-10-CM | POA: Diagnosis not present

## 2019-06-25 DIAGNOSIS — Z7901 Long term (current) use of anticoagulants: Secondary | ICD-10-CM

## 2019-06-25 MED ORDER — SIMVASTATIN 20 MG PO TABS: 20.0000 mg | ORAL_TABLET | Freq: Every day | ORAL | 3 refills | Status: DC

## 2019-06-25 MED ORDER — FUROSEMIDE 20 MG PO TABS: 40.0000 mg | ORAL_TABLET | Freq: Every day | ORAL | 3 refills | Status: DC

## 2019-06-25 MED ORDER — POTASSIUM CHLORIDE ER 10 MEQ PO TBCR: 10.0000 meq | EXTENDED_RELEASE_TABLET | Freq: Every day | ORAL | 3 refills | Status: DC

## 2019-06-25 NOTE — Assessment & Plan Note (Signed)
Pt seen for edema earlier this month and Lasix added.  He has lost 10 lbs but still has significant LE edema.

## 2019-06-25 NOTE — Assessment & Plan Note (Signed)
CHADS VASC= 6- on Eliquis 5 mg BID

## 2019-06-25 NOTE — Assessment & Plan Note (Signed)
On Actos

## 2019-06-25 NOTE — Assessment & Plan Note (Signed)
EF by echo in 2018 was 40-45% (when in AF)

## 2019-06-25 NOTE — Assessment & Plan Note (Signed)
Recurrent AF with slow VR-50.  Stop lanoxin.  Consider OP DCCV when volume stable- will discuss with Dr Martinique.

## 2019-06-25 NOTE — Assessment & Plan Note (Signed)
H/O CABG with re do CABG in '05 Cath 2016 -patent grafts

## 2019-06-25 NOTE — Telephone Encounter (Signed)
Late entry... con't CARDIOVERSION 07-09-2019 @ 10am with Nishan.  OK'd for 10d f/u with Denyse Amass, appt scheduled Friday 07-05-2019 @ 245pm to discuss lasix and cardioversion

## 2019-06-25 NOTE — Assessment & Plan Note (Signed)
S/P BF BPG Aug 2016-Dr Fields

## 2019-06-25 NOTE — Assessment & Plan Note (Signed)
On statin rx

## 2019-06-25 NOTE — Telephone Encounter (Signed)
Follow up:    Patient was returning a call to the nurse

## 2019-06-25 NOTE — Progress Notes (Signed)
Cardiology Office Note:    Date:  06/25/2019   ID:  [name], DOB 07/10/1940, MRN [id number]  PCP:  Kyle Cruel, MD  Cardiologist:  Kyle Martinique, MD  Electrophysiologist:  None   Referring MD: Kyle Cruel, MD   CC: persistent LE edema  History of Present Illness:    [name] is a 79 y.o. male with a hx of coronary disease, status post coronary artery bypass grafting in 1986 with redo bypass grafting in 2005.  At that time he received a free RIMA to the OM, a SVG to the diagonal, and SVG to the PDA.  The prior LIMA to LAD was intact.  In April 2016 he was admitted with a small bowel obstruction and underwent surgical treatment with lysis of adhesions.  His postop course was complicated by volume overload with a 20 pound weight gain which responded to diuresis.  In August 2016 he presented with claudication in the left leg and hip.  He ultimately underwent bilateral femoral bypass grafting by Dr. Oneida Dean.  In Jan 2018 he had a new diagnosis of atrial fibrillation.  Echocardiogram at that time showed an ejection fraction of 40 to 45%.  He was anticoagulated and underwent outpatient cardioversion in February 2018 to sinus rhythm.  He had been maintaining normal sinus rhythm.  Patient called the office earlier this month complaining of weight gain and lower extremity edema which she said was new.  He thought it had something to do with his Covid vaccine which he had had a couple weeks ago.  He was seen and put on Lasix with some improvement.  He returns today for follow-up.  The patient does admit that his edema is somewhat improved although he still has 1-2+ edema on exam.  He admits he has some shortness of breath.  On exam he is in atrial fibrillation.  This was confirmed by an EKG which showed atrial fibrillation with a slow ventricular response of 50.  Past Medical History:  Diagnosis Date  . Acute inferior myocardial infarction (HCC)    hx  . Acute on chronic  diastolic CHF (congestive heart failure), NYHA class 1 ([place]) 08/09/2014  . Anemia   . BPH (benign prostatic hyperplasia)   . CAD (coronary artery disease)   . Dyslipidemia   . History of blood transfusion    "I've had one; don't remember when"  . HTN (hypertension)   . Hyponatremia   . 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 Myocardial infarction [facility]) x2  1986, 2005  . 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 PAD (peripheral artery disease) ([place])   . Type II diabetes mellitus ([place])     Past Surgical History:  Procedure Laterality Date  . CARDIAC CATHETERIZATION  12/23/2014   Procedure: Left Heart Cath and Cors/Grafts Angiography;  Surgeon: Kyle M Martinique, MD;  Location: [place] CV [facility];  Service: Cardiovascular;;  . CARDIOVERSION N/A 05/05/2016   Procedure: CARDIOVERSION;  Surgeon: Lelon Perla, MD;  Location: [facility] ENDOSCOPY;  Service: Cardiovascular;  Laterality: N/A;  . CATARACT EXTRACTION W/ INTRAOCULAR LENS  IMPLANT, BILATERAL Bilateral   . CORONARY ANGIOPLASTY WITH STENT PLACEMENT    . CORONARY ARTERY BYPASS GRAFT  1986; redo 2005   ; free Rima to OM, svg-diag,svg-pda  . ENDARTERECTOMY FEMORAL Bilateral 12/31/2014   Procedure: BILATERAL FEMORAL ENDARTERECTOMY;  Surgeon: Elam Dutch, MD;  Location: [place];  Service: Vascular;  Laterality: Bilateral;  . FEMORAL-FEMORAL  BYPASS GRAFT Bilateral 12/31/2014   Procedure: FEMORAL-FEMORAL ARTERY BYPASS GRAFT;  Surgeon: Elam Dutch, MD;  Location: [place];  Service: Vascular;  Laterality: Bilateral;  . KYPHOPLASTY  02/29/2012   Procedure: KYPHOPLASTY;  Surgeon: Sinclair Ship, MD;  Location: [place];  Service: Orthopedics;  Laterality: Bilateral;  T 10 kyphoplasty  . LAPAROTOMY N/A 08/02/2014   Procedure: EXPLORATORY LAPAROTOMY LYSIS OF ADHESIONS;  Surgeon: Donnie Mesa, MD;  Location: [place];  Service: General;  Laterality: N/A;  . MELANOMA EXCISION     scalp  . PERIPHERAL VASCULAR CATHETERIZATION N/A 12/05/2014    Procedure: Abdominal Aortogram;  Surgeon: Elam Dutch, MD;  Location: [place] CV LAB;  Service: Cardiovascular;  Laterality: N/A;  . ROTATOR CUFF REPAIR Right   . TONSILLECTOMY      Current Medications: Current Meds  Medication Sig  . ALPRAZolam (XANAX) 0.5 MG tablet Take 0.5 mg by mouth daily.   Marland Kitchen apixaban (ELIQUIS) 5 MG TABS tablet Take 1 tablet (5 mg total) by mouth 2 (two) times daily.  Marland Kitchen aspirin 81 MG tablet Take 81 mg by mouth every other day.   . carvedilol (COREG) 12.5 MG tablet TAKE 1 TABLET TWICE A DAY  . furosemide (LASIX) 20 MG tablet Take 2 tablets (40 mg total) by mouth daily. 40 mg MON, WED&FRI 20MG  TUE,THUR  . glimepiride (AMARYL) 4 MG tablet Take 4 mg by mouth daily at 2 PM.   . iron polysaccharides (NIFEREX) 150 MG capsule Take 150 mg by mouth at bedtime.   . nitroGLYCERIN (NITROSTAT) 0.4 MG SL tablet PLACE 1 TABLET (0.4 MG TOTAL) UNDER THE TONGUE EVERY 5 (FIVE) MINUTES AS NEEDED FOR CHEST PAIN.  Marland Kitchen pioglitazone (ACTOS) 45 MG tablet Take 45 mg by mouth daily.   . potassium chloride (KLOR-CON) 10 MEQ tablet Take 1 tablet (10 mEq total) by mouth daily.  . simvastatin (ZOCOR) 20 MG tablet Take 1 tablet (20 mg total) by mouth at bedtime.  . [DISCONTINUED] digoxin (LANOXIN) 0.125 MG tablet Take 0.125 mg by mouth every other day.   . [DISCONTINUED] furosemide (LASIX) 20 MG tablet Take 1 tablet (20 mg total) by mouth daily. FOR 2 WEEKS AND STOP  . [DISCONTINUED] potassium chloride (KLOR-CON) 10 MEQ tablet 1 tab every day  for 2 weeks  . [DISCONTINUED] simvastatin (ZOCOR) 20 MG tablet Take 1 tablet (20 mg total) by mouth at bedtime.     Allergies:   Hctz [hydrochlorothiazide]   Social History   Socioeconomic History  . Marital status: Married    Spouse name: Not on file  . Number of children: 2  . Years of education: Not on file  . Highest education level: Not on file  Occupational History  . Occupation: Optician, dispensing: RETIRED  Tobacco Use  .  Smoking status: Former Smoker    Packs/day: 2.00    Years: 25.00    Pack years: 50.00    Types: Cigarettes    Quit date: 02/22/1986    Years since quitting: 33.3  . Smokeless tobacco: Never Used  Substance and Sexual Activity  . Alcohol use: Yes    Alcohol/week: 6.0 standard drinks    Types: 6 Cans of beer per week  . Drug use: No  . Sexual activity: Not Currently  Other Topics Concern  . Not on file  Social History Narrative  . Not on file   Social Determinants of Health   Financial Resource Strain:   . Difficulty of Paying Living Expenses:  Food Insecurity:   . Worried About Charity fundraiser in the Last Year:   . Arboriculturist in the Last Year:   Transportation Needs:   . Film/video editor (Medical):   Marland Kitchen Lack of Transportation (Non-Medical):   Physical Activity:   . Days of Exercise per Week:   . Minutes of Exercise per Session:   Stress:   . Feeling of Stress :   Social Connections:   . Frequency of Communication with Friends and Family:   . Frequency of Social Gatherings with Friends and Family:   . Attends Religious Services:   . Active Member of Clubs or Organizations:   . Attends Archivist Meetings:   Marland Kitchen Marital Status:      Family History: The patient's family history includes Dementia in his mother.  ROS:   Please see the history of present illness.     All other systems reviewed and are negative.  EKGs/Labs/Other Studies Reviewed:    The following studies were reviewed today: Echo 2018  EKG:  EKG is ordered today.  The ekg ordered today demonstrates AF with slow VR-50  Recent Labs: 06/07/2019: BUN 16; Creatinine, Ser 1.24; Potassium 4.3; Sodium 142  Recent Lipid Panel    Component Value Date/Time   CHOL 105 04/02/2016 0523   TRIG 54 04/02/2016 0523   HDL 51 04/02/2016 0523   CHOLHDL 2.1 04/02/2016 0523   VLDL 11 04/02/2016 0523   LDLCALC 43 04/02/2016 0523    Physical Exam:    VS:  BP 130/62   Pulse 62   Temp 98.2  F (36.8 C)   Ht 5\' 9"  (1.753 m)   Wt 177 lb 3.2 oz (80.4 kg)   SpO2 99%   BMI 26.17 kg/m     Wt Readings from Last 3 Encounters:  06/25/19 177 lb 3.2 oz (80.4 kg)  06/10/19 181 lb (82.1 kg)  06/03/19 187 lb (84.8 kg)     GEN: Well nourished, well developed in no acute distress HEENT: Normal NECK: No JVD; No carotid bruits CARDIAC: irregularly irregular, no murmurs, rubs, gallops RESPIRATORY:  Clear to auscultation without rales, wheezing or rhonchi  ABDOMEN: Soft, non-tender, non-distended MUSCULOSKELETAL:  1-2 + bilateral LE edema to below the knee; No deformity  SKIN: Warm and dry NEUROLOGIC:  Alert and oriented x 3 PSYCHIATRIC:  Normal affect   ASSESSMENT:    Edema of both legs Pt seen for edema earlier this month and Lasix added.  He has lost 10 lbs but still has significant LE edema.  PAF (paroxysmal atrial fibrillation) (HCC) Recurrent AF with slow VR-50.  Stop lanoxin.  Consider OP DCCV when volume stable- will discuss with Dr Dean.   Cardiomyopathy ([place]) EF by echo in 2018 was 40-45% (when in AF)  S/P CABG (coronary artery bypass graft) H/O CABG with re do CABG in '05 Cath 2016 -patent grafts  Peripheral vascular disease ([place]) S/P BF BPG Aug 2016-Dr Fields  Type 2 diabetes mellitus with diabetic polyneuropathy ([place]) On Actos   Essential hypertension Controlled  Dyslipidemia On statin rx  Chronic anticoagulation CHADS VASC= 6- on Eliquis 5 mg BID  PLAN:    Increase lasix to 40 mg MWF- 20 mg other days. Stop lanoxin.  I'll ask dr Dean to review- ? Consider OP DCCV once volume stable.    Medication Adjustments/Labs and Tests Ordered: Current medicines are reviewed at length with the patient today.  Concerns regarding medicines are outlined above.  Orders Placed  This Encounter  Procedures  . EKG 12-Lead   Meds ordered this encounter  Medications  . potassium chloride (KLOR-CON) 10 MEQ tablet    Sig: Take 1 tablet (10 mEq total) by mouth  daily.    Dispense:  90 tablet    Refill:  3  . furosemide (LASIX) 20 MG tablet    Sig: Take 2 tablets (40 mg total) by mouth daily. 40 mg MON, WED&FRI 20MG  TUE,THUR    Dispense:  32 tablet    Refill:  3  . simvastatin (ZOCOR) 20 MG tablet    Sig: Take 1 tablet (20 mg total) by mouth at bedtime.    Dispense:  90 tablet    Refill:  3    Patient Instructions  Medication Instructions:  STOP DIGOXIN  TAKE  LASIX 40 MG MON, WED, & FRI; 20MG  TUES, & THURS  *If you need a refill on your cardiac medications before your next appointment, please call your pharmacy*  Lab  Follow-Up: Your next appointment:  6 week(s)  In Person with Kyle Martinique, MD  At [facility], you and your health needs are our priority.  As part of our continuing mission to provide you with exceptional heart care, we have created designated Provider Care Teams.  These Care Teams include your primary Cardiologist (physician) and Advanced Practice Providers (APPs -  Physician Assistants and Nurse Practitioners) who all work together to provide you with the care you need, when you need it.  We recommend signing up for the patient portal called "MyChart".  Sign up information is provided on this After Visit Summary.  MyChart is used to connect with patients for Virtual Visits (Telemedicine).  Patients are able to view lab/test results, encounter notes, upcoming appointments, etc.  Non-urgent messages can be sent to your provider as well.   To learn more about what you can do with MyChart, go to [email or web address].         Angelena Form, PA-C  06/25/2019 3:06 PM    [facility]

## 2019-06-25 NOTE — H&P (View-Only) (Signed)
Cardiology Office Note:    Date:  06/25/2019   ID:  [name], DOB [birth year], MRN [id number]  PCP:  Lawerance Cruel, MD  Cardiologist:  Peter Martinique, MD  Electrophysiologist:  None   Referring MD: Lawerance Cruel, MD   CC: persistent LE edema  History of Present Illness:    [name] is a 79 y.o. male with a hx of coronary disease, status post coronary artery bypass grafting in 1986 with redo bypass grafting in 2005.  At that time he received a free RIMA to the OM, a SVG to the diagonal, and SVG to the PDA.  The prior LIMA to LAD was intact.  In April 2016 he was admitted with a small bowel obstruction and underwent surgical treatment with lysis of adhesions.  His postop course was complicated by volume overload with a 20 pound weight gain which responded to diuresis.  In August 2016 he presented with claudication in the left leg and hip.  He ultimately underwent bilateral femoral bypass grafting by Dr. Oneida Alar.  In Jan 2018 he had a new diagnosis of atrial fibrillation.  Echocardiogram at that time showed an ejection fraction of 40 to 45%.  He was anticoagulated and underwent outpatient cardioversion in February 2018 to sinus rhythm.  He had been maintaining normal sinus rhythm.  Patient called the office earlier this month complaining of weight gain and lower extremity edema which she said was new.  He thought it had something to do with his Covid vaccine which he had had a couple weeks ago.  He was seen and put on Lasix with some improvement.  He returns today for follow-up.  The patient does admit that his edema is somewhat improved although he still has 1-2+ edema on exam.  He admits he has some shortness of breath.  On exam he is in atrial fibrillation.  This was confirmed by an EKG which showed atrial fibrillation with a slow ventricular response of 50.  Past Medical History:  Diagnosis Date  . Acute inferior myocardial infarction (HCC)    hx  . Acute on chronic  diastolic CHF (congestive heart failure), NYHA class 1 ([place]) 08/09/2014  . Anemia   . BPH (benign prostatic hyperplasia)   . CAD (coronary artery disease)   . Dyslipidemia   . History of blood transfusion    "I've had one; don't remember when"  . HTN (hypertension)   . Hyponatremia   . 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 Myocardial infarction [facility]) x2  1986, 2005  . 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 PAD (peripheral artery disease) ([place])   . Type II diabetes mellitus ([place])     Past Surgical History:  Procedure Laterality Date  . CARDIAC CATHETERIZATION  12/23/2014   Procedure: Left Heart Cath and Cors/Grafts Angiography;  Surgeon: Peter M Martinique, MD;  Location: [place] CV LAB;  Service: Cardiovascular;;  . CARDIOVERSION N/A 05/05/2016   Procedure: CARDIOVERSION;  Surgeon: Lelon Perla, MD;  Location: [facility] ENDOSCOPY;  Service: Cardiovascular;  Laterality: N/A;  . CATARACT EXTRACTION W/ INTRAOCULAR LENS  IMPLANT, BILATERAL Bilateral   . CORONARY ANGIOPLASTY WITH STENT PLACEMENT    . CORONARY ARTERY BYPASS GRAFT  1986; redo 2005   ; free Rima to OM, svg-diag,svg-pda  . ENDARTERECTOMY FEMORAL Bilateral 12/31/2014   Procedure: BILATERAL FEMORAL ENDARTERECTOMY;  Surgeon: Elam Dutch, MD;  Location: [place];  Service: Vascular;  Laterality: Bilateral;  . FEMORAL-FEMORAL  BYPASS GRAFT Bilateral 12/31/2014   Procedure: FEMORAL-FEMORAL ARTERY BYPASS GRAFT;  Surgeon: Elam Dutch, MD;  Location: [place];  Service: Vascular;  Laterality: Bilateral;  . KYPHOPLASTY  02/29/2012   Procedure: KYPHOPLASTY;  Surgeon: Sinclair Ship, MD;  Location: [place];  Service: Orthopedics;  Laterality: Bilateral;  T 10 kyphoplasty  . LAPAROTOMY N/A 08/02/2014   Procedure: EXPLORATORY LAPAROTOMY LYSIS OF ADHESIONS;  Surgeon: Donnie Mesa, MD;  Location: [place];  Service: General;  Laterality: N/A;  . MELANOMA EXCISION     scalp  . PERIPHERAL VASCULAR CATHETERIZATION N/A 12/05/2014    Procedure: Abdominal Aortogram;  Surgeon: Elam Dutch, MD;  Location: [place] CV LAB;  Service: Cardiovascular;  Laterality: N/A;  . ROTATOR CUFF REPAIR Right   . TONSILLECTOMY      Current Medications: Current Meds  Medication Sig  . ALPRAZolam (XANAX) 0.5 MG tablet Take 0.5 mg by mouth daily.   Marland Kitchen apixaban (ELIQUIS) 5 MG TABS tablet Take 1 tablet (5 mg total) by mouth 2 (two) times daily.  Marland Kitchen aspirin 81 MG tablet Take 81 mg by mouth every other day.   . carvedilol (COREG) 12.5 MG tablet TAKE 1 TABLET TWICE A DAY  . furosemide (LASIX) 20 MG tablet Take 2 tablets (40 mg total) by mouth daily. 40 mg MON, WED&FRI 20MG  TUE,THUR  . glimepiride (AMARYL) 4 MG tablet Take 4 mg by mouth daily at 2 PM.   . iron polysaccharides (NIFEREX) 150 MG capsule Take 150 mg by mouth at bedtime.   . nitroGLYCERIN (NITROSTAT) 0.4 MG SL tablet PLACE 1 TABLET (0.4 MG TOTAL) UNDER THE TONGUE EVERY 5 (FIVE) MINUTES AS NEEDED FOR CHEST PAIN.  Marland Kitchen pioglitazone (ACTOS) 45 MG tablet Take 45 mg by mouth daily.   . potassium chloride (KLOR-CON) 10 MEQ tablet Take 1 tablet (10 mEq total) by mouth daily.  . simvastatin (ZOCOR) 20 MG tablet Take 1 tablet (20 mg total) by mouth at bedtime.  . [DISCONTINUED] digoxin (LANOXIN) 0.125 MG tablet Take 0.125 mg by mouth every other day.   . [DISCONTINUED] furosemide (LASIX) 20 MG tablet Take 1 tablet (20 mg total) by mouth daily. FOR 2 WEEKS AND STOP  . [DISCONTINUED] potassium chloride (KLOR-CON) 10 MEQ tablet 1 tab every day  for 2 weeks  . [DISCONTINUED] simvastatin (ZOCOR) 20 MG tablet Take 1 tablet (20 mg total) by mouth at bedtime.     Allergies:   Hctz [hydrochlorothiazide]   Social History   Socioeconomic History  . Marital status: Married    Spouse name: Not on file  . Number of children: 2  . Years of education: Not on file  . Highest education level: Not on file  Occupational History  . Occupation: Optician, dispensing: RETIRED  Tobacco Use  .  Smoking status: Former Smoker    Packs/day: 2.00    Years: 25.00    Pack years: 50.00    Types: Cigarettes    Quit date: 02/22/1986    Years since quitting: 33.3  . Smokeless tobacco: Never Used  Substance and Sexual Activity  . Alcohol use: Yes    Alcohol/week: 6.0 standard drinks    Types: 6 Cans of beer per week  . Drug use: No  . Sexual activity: Not Currently  Other Topics Concern  . Not on file  Social History Narrative  . Not on file   Social Determinants of Health   Financial Resource Strain:   . Difficulty of Paying Living Expenses:  Food Insecurity:   . Worried About Charity fundraiser in the Last Year:   . Arboriculturist in the Last Year:   Transportation Needs:   . Film/video editor (Medical):   Marland Kitchen Lack of Transportation (Non-Medical):   Physical Activity:   . Days of Exercise per Week:   . Minutes of Exercise per Session:   Stress:   . Feeling of Stress :   Social Connections:   . Frequency of Communication with Friends and Family:   . Frequency of Social Gatherings with Friends and Family:   . Attends Religious Services:   . Active Member of Clubs or Organizations:   . Attends Archivist Meetings:   Marland Kitchen Marital Status:      Family History: The patient's family history includes Dementia in his mother.  ROS:   Please see the history of present illness.     All other systems reviewed and are negative.  EKGs/Labs/Other Studies Reviewed:    The following studies were reviewed today: Echo 2018  EKG:  EKG is ordered today.  The ekg ordered today demonstrates AF with slow VR-50  Recent Labs: 06/07/2019: BUN 16; Creatinine, Ser 1.24; Potassium 4.3; Sodium 142  Recent Lipid Panel    Component Value Date/Time   CHOL 105 04/02/2016 0523   TRIG 54 04/02/2016 0523   HDL 51 04/02/2016 0523   CHOLHDL 2.1 04/02/2016 0523   VLDL 11 04/02/2016 0523   LDLCALC 43 04/02/2016 0523    Physical Exam:    VS:  BP 130/62   Pulse 62   Temp 98.2  F (36.8 C)   Ht 5\' 9"  (1.753 m)   Wt 177 lb 3.2 oz (80.4 kg)   SpO2 99%   BMI 26.17 kg/m     Wt Readings from Last 3 Encounters:  06/25/19 177 lb 3.2 oz (80.4 kg)  06/10/19 181 lb (82.1 kg)  06/03/19 187 lb (84.8 kg)     GEN: Well nourished, well developed in no acute distress HEENT: Normal NECK: No JVD; No carotid bruits CARDIAC: irregularly irregular, no murmurs, rubs, gallops RESPIRATORY:  Clear to auscultation without rales, wheezing or rhonchi  ABDOMEN: Soft, non-tender, non-distended MUSCULOSKELETAL:  1-2 + bilateral LE edema to below the knee; No deformity  SKIN: Warm and dry NEUROLOGIC:  Alert and oriented x 3 PSYCHIATRIC:  Normal affect   ASSESSMENT:    Edema of both legs Pt seen for edema earlier this month and Lasix added.  He has lost 10 lbs but still has significant LE edema.  PAF (paroxysmal atrial fibrillation) (HCC) Recurrent AF with slow VR-50.  Stop lanoxin.  Consider OP DCCV when volume stable- will discuss with Dr Martinique.   Cardiomyopathy ([place]) EF by echo in 2018 was 40-45% (when in AF)  S/P CABG (coronary artery bypass graft) H/O CABG with re do CABG in '05 Cath 2016 -patent grafts  Peripheral vascular disease ([place]) S/P BF BPG Aug 2016-Dr Fields  Type 2 diabetes mellitus with diabetic polyneuropathy ([place]) On Actos   Essential hypertension Controlled  Dyslipidemia On statin rx  Chronic anticoagulation CHADS VASC= 6- on Eliquis 5 mg BID  PLAN:    Increase lasix to 40 mg MWF- 20 mg other days. Stop lanoxin.  I'll ask dr Martinique to review- ? Consider OP DCCV once volume stable.    Medication Adjustments/Labs and Tests Ordered: Current medicines are reviewed at length with the patient today.  Concerns regarding medicines are outlined above.  Orders Placed  This Encounter  Procedures  . EKG 12-Lead   Meds ordered this encounter  Medications  . potassium chloride (KLOR-CON) 10 MEQ tablet    Sig: Take 1 tablet (10 mEq total) by mouth  daily.    Dispense:  90 tablet    Refill:  3  . furosemide (LASIX) 20 MG tablet    Sig: Take 2 tablets (40 mg total) by mouth daily. 40 mg MON, WED&FRI 20MG  TUE,THUR    Dispense:  32 tablet    Refill:  3  . simvastatin (ZOCOR) 20 MG tablet    Sig: Take 1 tablet (20 mg total) by mouth at bedtime.    Dispense:  90 tablet    Refill:  3    Patient Instructions  Medication Instructions:  STOP DIGOXIN  TAKE  LASIX 40 MG MON, WED, & FRI; 20MG  TUES, & THURS  *If you need a refill on your cardiac medications before your next appointment, please call your pharmacy*  Lab  Follow-Up: Your next appointment:  6 week(s)  In Person with Peter Martinique, MD  At [facility], you and your health needs are our priority.  As part of our continuing mission to provide you with exceptional heart care, we have created designated Provider Care Teams.  These Care Teams include your primary Cardiologist (physician) and Advanced Practice Providers (APPs -  Physician Assistants and Nurse Practitioners) who all work together to provide you with the care you need, when you need it.  We recommend signing up for the patient portal called "MyChart".  Sign up information is provided on this After Visit Summary.  MyChart is used to connect with patients for Virtual Visits (Telemedicine).  Patients are able to view lab/test results, encounter notes, upcoming appointments, etc.  Non-urgent messages can be sent to your provider as well.   To learn more about what you can do with MyChart, go to [email or web address].         Angelena Form, PA-C  06/25/2019 3:06 PM    [facility]

## 2019-06-25 NOTE — Telephone Encounter (Signed)
Talked to LK per PJ f/u in 10 days to discuss medications/cardioversion.  Schedule Cardioversion in 2 weeks 07-09-2019. Nishan 10am

## 2019-06-25 NOTE — Patient Instructions (Signed)
Medication Instructions:  STOP DIGOXIN  TAKE  LASIX 40 MG MON, WED, & FRI; 20MG  TUES, & THURS  *If you need a refill on your cardiac medications before your next appointment, please call your pharmacy*  Lab  Follow-Up: Your next appointment:  6 week(s)  In Person with Peter Martinique, MD  At [facility], you and your health needs are our priority.  As part of our continuing mission to provide you with exceptional heart care, we have created designated Provider Care Teams.  These Care Teams include your primary Cardiologist (physician) and Advanced Practice Providers (APPs -  Physician Assistants and Nurse Practitioners) who all work together to provide you with the care you need, when you need it.  We recommend signing up for the patient portal called "MyChart".  Sign up information is provided on this After Visit Summary.  MyChart is used to connect with patients for Virtual Visits (Telemedicine).  Patients are able to view lab/test results, encounter notes, upcoming appointments, etc.  Non-urgent messages can be sent to your provider as well.   To learn more about what you can do with MyChart, go to [email or web address].

## 2019-06-25 NOTE — Assessment & Plan Note (Signed)
Controlled.  

## 2019-06-25 NOTE — Telephone Encounter (Signed)
Pt called back informed to come in for appt to discuss lasix/DCCV with Coletta Memos, FNP-C 07-05-2019 @ 245pm arrive @ 230pm alone and wearing a mask, verbalizes understanding.

## 2019-07-02 ENCOUNTER — Telehealth: Payer: Self-pay | Admitting: Cardiology

## 2019-07-02 NOTE — Telephone Encounter (Signed)
Spoke with patient. Patient informed he has a cardioversion scheduled for 4/13 pending his appointment with Coletta Memos on 07/05/19. Explained procedure to patient. Patient verbalized understanding, will need written instructions given at appointment on 07/05/19 if cardioversion is still warranted.

## 2019-07-02 NOTE — Telephone Encounter (Signed)
New Message   Pt is calling about a procedure he says he is scheduled for and does not know why  He is wanting Dr Morrison Old nurse to call him back    Please call

## 2019-07-02 NOTE — Telephone Encounter (Signed)
See prev telephone note on 3-30 and LK appt on 3-30. Lurena Joiner discussed this with pt at appt. This DCCV was scheduled at that time. Left detailed message for pt but, he never called back to discuss. Will discuss this at upcoming appt w/Jesse. Scheduled COVID for DCCV on 2-9 after appt w/Jesse, will discuss this at that time

## 2019-07-03 ENCOUNTER — Other Ambulatory Visit: Payer: Self-pay | Admitting: *Deleted

## 2019-07-03 DIAGNOSIS — R0989 Other specified symptoms and signs involving the circulatory and respiratory systems: Secondary | ICD-10-CM

## 2019-07-03 DIAGNOSIS — I739 Peripheral vascular disease, unspecified: Secondary | ICD-10-CM

## 2019-07-04 ENCOUNTER — Ambulatory Visit (HOSPITAL_COMMUNITY)
Admission: RE | Admit: 2019-07-04 | Discharge: 2019-07-04 | Disposition: A | Discharge status: Home / Self Care | Payer: Medicare Other | Source: Ambulatory Visit | Attending: Surgery | Admitting: Surgery

## 2019-07-04 ENCOUNTER — Ambulatory Visit (INDEPENDENT_AMBULATORY_CARE_PROVIDER_SITE_OTHER)
Admission: RE | Admit: 2019-07-04 | Discharge: 2019-07-04 | Disposition: A | Discharge status: Home / Self Care | Payer: Medicare Other | Source: Ambulatory Visit | Attending: Surgery | Admitting: Surgery

## 2019-07-04 ENCOUNTER — Ambulatory Visit (INDEPENDENT_AMBULATORY_CARE_PROVIDER_SITE_OTHER): Payer: Medicare Other | Admitting: Physician Assistant

## 2019-07-04 ENCOUNTER — Other Ambulatory Visit: Payer: Self-pay

## 2019-07-04 VITALS — BP 127/65 | HR 60 | Temp 97.8°F | Resp 16 | Ht 69.0 in | Wt 172.0 lb

## 2019-07-04 DIAGNOSIS — I739 Peripheral vascular disease, unspecified: Secondary | ICD-10-CM | POA: Insufficient documentation

## 2019-07-04 DIAGNOSIS — R0989 Other specified symptoms and signs involving the circulatory and respiratory systems: Secondary | ICD-10-CM

## 2019-07-04 DIAGNOSIS — I6523 Occlusion and stenosis of bilateral carotid arteries: Secondary | ICD-10-CM

## 2019-07-04 NOTE — Progress Notes (Signed)
History of Present Illness:  Patient is a 79 y.o. year old male who presents for evaluation of B LE s/p femoral-femoral bypass with bilateral femoral endarterectomy October 2016 by Dr. Oneida Alar.  He denise symptoms of claudication, rest pain, or non healing wounds.  He does have chronic LE edema and is managed.    He is also followed for B asymptomatic carotid stenosis.  He denise amaurosis, weakness on one side of his body, and aphasia.    Recently he has developed A fib and is scheduled for cardioversion.  He is on Eliquis for anticoagulation protection.   Aspirin and Zocor daily.     Past Medical History:  Diagnosis Date  . Acute inferior myocardial infarction (HCC)    hx  . Acute on chronic diastolic CHF (congestive heart failure), NYHA class 1 ([place]) 08/09/2014  . Anemia   . BPH (benign prostatic hyperplasia)   . CAD (coronary artery disease)   . Dyslipidemia   . History of blood transfusion    "I've had one; don't remember when"  . HTN (hypertension)   . Hyponatremia   . 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 Myocardial infarction [facility]) x2  1986, 2005  . 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 PAD (peripheral artery disease) ([place])   . Type II diabetes mellitus ([place])     Past Surgical History:  Procedure Laterality Date  . CARDIAC CATHETERIZATION  12/23/2014   Procedure: Left Heart Cath and Cors/Grafts Angiography;  Surgeon: Peter M Martinique, MD;  Location: [employer] [facility] CV LAB;  Service: Cardiovascular;;  . CARDIOVERSION N/A 05/05/2016   Procedure: CARDIOVERSION;  Surgeon: Lelon Perla, MD;  Location: [facility] ENDOSCOPY;  Service: Cardiovascular;  Laterality: N/A;  . CATARACT EXTRACTION W/ INTRAOCULAR LENS  IMPLANT, BILATERAL Bilateral   . CORONARY ANGIOPLASTY WITH STENT PLACEMENT    . CORONARY ARTERY BYPASS GRAFT  1986; redo 2005   ; free Rima to OM, svg-diag,svg-pda  . ENDARTERECTOMY FEMORAL Bilateral 12/31/2014   Procedure: BILATERAL FEMORAL ENDARTERECTOMY;   Surgeon: Elam Dutch, MD;  Location: [place];  Service: Vascular;  Laterality: Bilateral;  . FEMORAL-FEMORAL BYPASS GRAFT Bilateral 12/31/2014   Procedure: FEMORAL-FEMORAL ARTERY BYPASS GRAFT;  Surgeon: Elam Dutch, MD;  Location: [place];  Service: Vascular;  Laterality: Bilateral;  . KYPHOPLASTY  02/29/2012   Procedure: KYPHOPLASTY;  Surgeon: Sinclair Ship, MD;  Location: [place];  Service: Orthopedics;  Laterality: Bilateral;  T 10 kyphoplasty  . LAPAROTOMY N/A 08/02/2014   Procedure: EXPLORATORY LAPAROTOMY LYSIS OF ADHESIONS;  Surgeon: Donnie Mesa, MD;  Location: [place];  Service: General;  Laterality: N/A;  . MELANOMA EXCISION     scalp  . PERIPHERAL VASCULAR CATHETERIZATION N/A 12/05/2014   Procedure: Abdominal Aortogram;  Surgeon: Elam Dutch, MD;  Location: [place] CV LAB;  Service: Cardiovascular;  Laterality: N/A;  . ROTATOR CUFF REPAIR Right   . TONSILLECTOMY      ROS:   General:  No weight loss, Fever, chills  HEENT: No recent headaches, no nasal bleeding, no visual changes, no sore throat  Neurologic: No dizziness, blackouts, seizures. No recent symptoms of stroke or mini- stroke. No recent episodes of slurred speech, or temporary blindness.  Cardiac: No recent episodes of chest pain/pressure, no shortness of breath at rest.  No shortness of breath with exertion.  Denies history of atrial fibrillation or irregular heartbeat  Vascular: No history of rest pain in feet.  No history  of claudication.  No history of non-healing ulcer, No history of DVT   Pulmonary: No home oxygen, no productive cough, no hemoptysis,  No asthma or wheezing  Musculoskeletal:  [ ]  Arthritis, [ ]  Low back pain,  [x ] Joint pain  Hematologic:No history of hypercoagulable state.  No history of easy bleeding.  No history of anemia  Gastrointestinal: No hematochezia or melena,  No gastroesophageal reflux, no trouble swallowing  Urinary: [ ]  chronic Kidney disease, [ ]  on HD - [ ]   MWF or [ ]  TTHS, [ ]  Burning with urination, [ ]  Frequent urination, [ ]  Difficulty urinating;   Skin: No rashes  Psychological: No history of anxiety,  No history of depression  Social History Social History   Tobacco Use  . Smoking status: Former Smoker    Packs/day: 2.00    Years: 25.00    Pack years: 50.00    Types: Cigarettes    Quit date: 02/22/1986    Years since quitting: 33.3  . Smokeless tobacco: Never Used  Substance Use Topics  . Alcohol use: Yes    Alcohol/week: 6.0 standard drinks    Types: 6 Cans of beer per week  . Drug use: No    Family History Family History  Problem Relation Age of Onset  . Dementia Mother     Allergies  Allergies  Allergen Reactions  . Hctz [Hydrochlorothiazide] Other (See Comments)    Hypokalemia      Current Outpatient Medications  Medication Sig Dispense Refill  . ALPRAZolam (XANAX) 0.5 MG tablet Take 0.5 mg by mouth daily.   2  . apixaban (ELIQUIS) 5 MG TABS tablet Take 1 tablet (5 mg total) by mouth 2 (two) times daily. 60 tablet 0  . aspirin 81 MG tablet Take 81 mg by mouth every other day.     . carvedilol (COREG) 12.5 MG tablet TAKE 1 TABLET TWICE A DAY (Patient taking differently: Take 12.5 mg by mouth 2 (two) times daily with a meal. ) 180 tablet 2  . furosemide (LASIX) 20 MG tablet Take 2 tablets (40 mg total) by mouth daily. 40 mg MON, WED&FRI 20MG  TUE,THUR (Patient taking differently: Take 20-40 mg by mouth See admin instructions. 40 mg MON, WED&FRI;  20MG  TUE,THUR) 32 tablet 3  . glimepiride (AMARYL) 4 MG tablet Take 4 mg by mouth daily at 2 PM.     . iron polysaccharides (NIFEREX) 150 MG capsule Take 150 mg by mouth at bedtime.     Marland Kitchen losartan (COZAAR) 50 MG tablet Take 50 mg by mouth daily.    . nitroGLYCERIN (NITROSTAT) 0.4 MG SL tablet PLACE 1 TABLET (0.4 MG TOTAL) UNDER THE TONGUE EVERY 5 (FIVE) MINUTES AS NEEDED FOR CHEST PAIN. 25 tablet 2  . pioglitazone (ACTOS) 45 MG tablet Take 45 mg by mouth daily.     .  potassium chloride (KLOR-CON) 10 MEQ tablet Take 1 tablet (10 mEq total) by mouth daily. 90 tablet 3  . simvastatin (ZOCOR) 20 MG tablet Take 1 tablet (20 mg total) by mouth at bedtime. 90 tablet 3   No current facility-administered medications for this visit.    Physical Examination  Vitals:   07/04/19 1256 07/04/19 1258  BP: (!) 134/58 127/65  Pulse: 60   Resp: 16   Temp: 97.8 F (36.6 C)   SpO2: 99%   Weight: 172 lb (78 kg)   Height: 5\' 9"  (1.753 m)     Body mass index is 25.4 kg/m.  General:  Alert and oriented, no acute distress HEENT: Normal Neck: No bruit or JVD Pulmonary: Clear to auscultation bilaterally Cardiac: Irregularly irregular without murmur Abdomen: Soft, non-tender, non-distended, no mass, no scars Skin: No rash Extremity Pulses:  2+ radial, brachial, femoral, left dorsalis palpable pulse  Musculoskeletal: chronic B Lower leg edema without brawny skin changes and no open wounds  Neurologic: Upper and lower extremity motor 5/5 and symmetric  DATA:    ABI Findings:  +--------+------------------+-----+----------+----------------+  Right  Rt Pressure (mmHg)IndexWaveform Comment       +--------+------------------+-----+----------+----------------+  Brachial                 Non-compressible  +--------+------------------+-----+----------+----------------+  PTA               monophasicNon-compressible  +--------+------------------+-----+----------+----------------+  DP               biphasic Non-compressible  +--------+------------------+-----+----------+----------------+   +--------+------------------+-----+--------+----------------+  Left  Lt Pressure (mmHg)IndexWaveformComment       +--------+------------------+-----+--------+----------------+  Brachial                Non-compressible  +--------+------------------+-----+--------+----------------+   PTA               biphasicNon-compressible  +--------+------------------+-----+--------+----------------+  DP               biphasicNon-compressible  +--------+------------------+-----+--------+----------------+   +-------+----------------+----------------+----------------+------------+  ABI/TBIToday's ABI   Today's TBI   Previous ABI  Previous TBI  +-------+----------------+----------------+----------------+------------+  Right Non-compressibleunable to obtainNon-compressible0.23      +-------+----------------+----------------+----------------+------------+  Left  Non-compressibleunable to obtainNon-compressible0.36      +-------+----------------+----------------+----------------+------------+      Right Carotid Findings:  +----------+--------+--------+--------+-------------------------+--------+       PSV cm/sEDV cm/sStenosisPlaque Description    Comments  +----------+--------+--------+--------+-------------------------+--------+  CCA Prox 103   16       heterogenous             +----------+--------+--------+--------+-------------------------+--------+  CCA Mid  109   11       heterogenous             +----------+--------+--------+--------+-------------------------+--------+  CCA Distal85   17       heterogenous and calcific      +----------+--------+--------+--------+-------------------------+--------+  ICA Prox 179   40   40-59% heterogenous and calcific      +----------+--------+--------+--------+-------------------------+--------+  ICA Mid  182   21                          +----------+--------+--------+--------+-------------------------+--------+  ICA Distal126   23                            +----------+--------+--------+--------+-------------------------+--------+  ECA    206   24                          +----------+--------+--------+--------+-------------------------+--------+   +----------+--------+-------+----------------+-------------------+       PSV cm/sEDV cmsDescribe    Arm Pressure (mmHG)  +----------+--------+-------+----------------+-------------------+  [id number]       Multiphasic, WNL            +----------+--------+-------+----------------+-------------------+   +---------+--------+--+--------+-+---------+  VertebralPSV cm/s38EDV cm/s7Antegrade  +---------+--------+--+--------+-+---------+      Left Carotid Findings:  +----------+--------+--------+--------+-------------------------+--------+       PSV cm/sEDV cm/sStenosisPlaque Description    Comments  +----------+--------+--------+--------+-------------------------+--------+  CCA Prox 182   22       heterogenous             +----------+--------+--------+--------+-------------------------+--------+  CCA  Mid  125   13       heterogenous             +----------+--------+--------+--------+-------------------------+--------+  CCA Distal95   22       heterogenous and calcific      +----------+--------+--------+--------+-------------------------+--------+  ICA Prox 115   23   1-39%  heterogenous             +----------+--------+--------+--------+-------------------------+--------+  ICA Mid  103   17                          +----------+--------+--------+--------+-------------------------+--------+  ICA Distal131   29                          +----------+--------+--------+--------+-------------------------+--------+  ECA    138   15                           +----------+--------+--------+--------+-------------------------+--------+   +----------+--------+--------+----------------+-------------------+       PSV cm/sEDV cm/sDescribe    Arm Pressure (mmHG)  +----------+--------+--------+----------------+-------------------+  [id number]       Multiphasic, WNL            +----------+--------+--------+----------------+-------------------+   +---------+--------+--+--------+--+---------+  VertebralPSV cm/s57EDV cm/s12Antegrade  +---------+--------+--+--------+--+---------+        Summary:  Right Carotid: Velocities in the right ICA are consistent with a 1-39%  stenosis.   Left Carotid: Velocities in the left ICA are consistent with a 1-39%  stenosis.   Vertebrals: Bilateral vertebral arteries demonstrate antegrade flow.  Subclavians: Normal flow hemodynamics were seen in bilateral subclavian        arteries.     Summary:  Right: Resting right ankle-brachial index indicates noncompressible right  lower extremity arteries.   Unable to obtain toe index due to non-compressible brachial arteries.   Left: Resting left ankle-brachial index indicates noncompressible left  lower extremity arteries.  Unable to obtain toe index due to non-compressible brachial arteries.   ASSESSMENT:  B LE s/p femoral-femoral bypass with bilateral femoral endarterectomy.  He has palpable femoral pulse with patent flow and maintains being asymptomatic with ambulation.  His carotid stenosis has been stable and not changed since his last duplex 2 years age.   PLAN: He will f/u in 1 year for repeat ABI's and carotid duplex.  We reviewed both symptoms of claudication and stroke/TIA.  If he develops problems or concerns he will call sooner.   Roxy Horseman PA-C [facility] Office: [phone]  MD in clinic [place]

## 2019-07-04 NOTE — Progress Notes (Signed)
Cardiology Clinic Note   Patient Name: [name] Date of Encounter: 07/05/2019  Primary Care Provider:  Lawerance Cruel, MD Primary Cardiologist:  Peter Martinique, MD  Patient Profile   Kyle L McCuen87 year old male presents today for further evaluation of his atrial fibrillation.   Past Medical History    Past Medical History:  Diagnosis Date  . Acute inferior myocardial infarction (HCC)    hx  . Acute on chronic diastolic CHF (congestive heart failure), NYHA class 1 ([place]) 08/09/2014  . Anemia   . BPH (benign prostatic hyperplasia)   . CAD (coronary artery disease)   . Dyslipidemia   . History of blood transfusion    "I've had one; don't remember when"  . HTN (hypertension)   . Hyponatremia   . 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 Myocardial infarction [facility]) x2  1986, 2005  . 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 PAD (peripheral artery disease) ([place])   . Type II diabetes mellitus ([place])    Past Surgical History:  Procedure Laterality Date  . CARDIAC CATHETERIZATION  12/23/2014   Procedure: Left Heart Cath and Cors/Grafts Angiography;  Surgeon: Peter M Martinique, MD;  Location: [place] CV LAB;  Service: Cardiovascular;;  . CARDIOVERSION N/A 05/05/2016   Procedure: CARDIOVERSION;  Surgeon: Lelon Perla, MD;  Location: [facility] ENDOSCOPY;  Service: Cardiovascular;  Laterality: N/A;  . CATARACT EXTRACTION W/ INTRAOCULAR LENS  IMPLANT, BILATERAL Bilateral   . CORONARY ANGIOPLASTY WITH STENT PLACEMENT    . CORONARY ARTERY BYPASS GRAFT  1986; redo 2005   ; free Rima to OM, svg-diag,svg-pda  . ENDARTERECTOMY FEMORAL Bilateral 12/31/2014   Procedure: BILATERAL FEMORAL ENDARTERECTOMY;  Surgeon: Elam Dutch, MD;  Location: [place];  Service: Vascular;  Laterality: Bilateral;  . FEMORAL-FEMORAL BYPASS GRAFT Bilateral 12/31/2014   Procedure: FEMORAL-FEMORAL ARTERY BYPASS GRAFT;  Surgeon: Elam Dutch, MD;  Location: [place];  Service: Vascular;   Laterality: Bilateral;  . KYPHOPLASTY  02/29/2012   Procedure: KYPHOPLASTY;  Surgeon: Sinclair Ship, MD;  Location: [place];  Service: Orthopedics;  Laterality: Bilateral;  T 10 kyphoplasty  . LAPAROTOMY N/A 08/02/2014   Procedure: EXPLORATORY LAPAROTOMY LYSIS OF ADHESIONS;  Surgeon: Donnie Mesa, MD;  Location: [place];  Service: General;  Laterality: N/A;  . MELANOMA EXCISION     scalp  . PERIPHERAL VASCULAR CATHETERIZATION N/A 12/05/2014   Procedure: Abdominal Aortogram;  Surgeon: Elam Dutch, MD;  Location: [place] [facility];  Service: Cardiovascular;  Laterality: N/A;  . ROTATOR CUFF REPAIR Right   . TONSILLECTOMY      Allergies  Allergies  Allergen Reactions  . Hctz [Hydrochlorothiazide] Other (See Comments)    Hypokalemia     History of Present Illness    [name] has a PMH of coronary artery disease status post redo coronary artery bypass in 2005 after emergent stenting of his left main. His surgery included a free RIMA graft to the OM, SVG to diagonal, and SVG to PDA. His LIMA to LAD or intact from his original surgery. He has also had an old anterior MI. April 2016 he was admitted with shortness of breath and underwent surgery with exploratory lap and lysis of adhesions. His postop course was complicated by marked volume overload due to IV fluids with an over 20 pound weight gain. He was diuresed. His echocardiogram showed an EF of 45%. In August 2016 he presented with progressive claudication in his left hip  and leg. Dopplers showed severe PAD. Angiography was completed by Dr. Oneida Alar and showed severe left iliac and bilateral common femoral disease. He had an abnormal Myoview study and cardiac catheterization was done which showed patent grafts. He underwent surgery by Dr. Oneida Alar with bilateral femoral endarterectomy and femorofemoral BPG.  He was admitted 1/4-04/03/16 with a URI, CHF, new diagnosis of atrial fibrillation. He was rate controlled with  carvedilol and digoxin. Bradycardia with Cardizem. He was started on Eliquis and Plavix was continued. His TSH was normal, EF 40-45% his CHA2DS2-VASc score was 6. (Age x2, hypertension, diabetes, CAD, CHF). He was anticoagulated for 4 weeks and underwent DCCV 05/05/2016. Losartan was held for elevated creatinine. He later returned to normal and his losartan was resumed at 50 mg daily.  His last seen by Dr. Martinique on 09/25/2018. During that time he was doing well. He was able to walk up to 45 minutes at a time without chest discomfort, dyspnea, or significant claudication. He did have some weakness in his left leg but denied edema and palpitations.  He contacted nurse triage line on 05/30/2019 and indicated that he had lower extremity swelling and tightness. He had not been weighing himself daily or elevating his lower extremities. He was instructed to start daily weightsat that time.  He presented 06/03/2019  for follow-up and statedfor the past 2 weeks he  had increased lower extremity swelling. His left lower extremity was larger than his right. He had not been weighing daily, oreating a low-salt diet. He stated that he received a Covid shot about 12 days ago and was wondering if it was related to the injection. He continued to walk for 20 minutes a couple times a week.   He presented to the clinic 06/10/2019 for follow-up and stated he was starting to feel better.  He had lost 6 pounds over the last week.  He had no side effects with the furosemide or potassium.  He did feel he has quite a bit of lower extremity edema.  He had been following a low-sodium diet and was now wearing lower extremity compression stockings.  I will continued his furosemide 20 mg daily and 10 mEq of potassium.    He followed up with [name] PA-C on 06/25/2019 and was noted to be in atrial fibrillation with slow ventricular response.  His furosemide was increased to 40 mg Monday Wednesday Friday and he was  instructed to take 20 mg on the other days his Lanoxin was stopped.  His cardioversion schedule Dr. Johnsie Cancel on 07/09/2019.  He presents for follow-up today and states he feels well.  He has been increasing his physical activity and has been walking 15 minutes daily.  He is trying to eat a low-sodium diet.  He has been compliant with his lower extremity support stockings.  Overall he states he feels much much better.  He remains cardiac unaware.  Today hedenies chest pain, shortness of breath, increased lower extremity edema, fatigue, palpitations, melena, hematuria, hemoptysis, diaphoresis, weakness, presyncope, syncope, orthopnea, and PND.  Home Medications    Prior to Admission medications   Medication Sig Start Date End Date Taking? Authorizing Provider  ALPRAZolam Duanne Moron) 0.5 MG tablet Take 0.5 mg by mouth daily.  11/30/17   [provider]  apixaban (ELIQUIS) 5 MG TABS tablet Take 1 tablet (5 mg total) by mouth 2 (two) times daily. 04/03/16   Regalado, Belkys A, MD  aspirin 81 MG tablet Take 81 mg by mouth every other day.  [provider]  carvedilol (COREG) 12.5 MG tablet TAKE 1 TABLET TWICE A DAY Patient taking differently: Take 12.5 mg by mouth 2 (two) times daily with a meal.  02/22/11   Martinique, Peter M, MD  furosemide (LASIX) 20 MG tablet Take 2 tablets (40 mg total) by mouth daily. 40 mg MON, WED&FRI 20MG  TUE,THUR Patient taking differently: Take 20-40 mg by mouth See admin instructions. 40 mg MON, WED&FRI;  20MG  TUE,THUR 06/25/19 07/25/19  Croitoru, Mihai, MD  glimepiride (AMARYL) 4 MG tablet Take 4 mg by mouth daily at 2 PM.     [provider]  iron polysaccharides (NIFEREX) 150 MG capsule Take 150 mg by mouth at bedtime.     [provider]  losartan (COZAAR) 50 MG tablet Take 50 mg by mouth daily.    [provider]  nitroGLYCERIN (NITROSTAT) 0.4 MG SL tablet PLACE 1 TABLET (0.4 MG TOTAL) UNDER THE TONGUE EVERY 5 (FIVE) MINUTES AS NEEDED  FOR CHEST PAIN. 01/04/16   Martinique, Peter M, MD  pioglitazone (ACTOS) 45 MG tablet Take 45 mg by mouth daily.  04/13/12   [provider]  potassium chloride (KLOR-CON) 10 MEQ tablet Take 1 tablet (10 mEq total) by mouth daily. 06/25/19   Croitoru, Mihai, MD  simvastatin (ZOCOR) 20 MG tablet Take 1 tablet (20 mg total) by mouth at bedtime. 06/25/19   Croitoru, Dani Gobble, MD    Family History    Family History  Problem Relation Age of Onset  . Dementia Mother    He indicated that his mother is deceased. He indicated that his father is deceased. He indicated that his sister is alive. He indicated that only one of his two brothers is alive. He indicated that his maternal grandmother is deceased. He indicated that his maternal grandfather is deceased. He indicated that his paternal grandmother is deceased. He indicated that his paternal grandfather is deceased.  Social History    Social History   Socioeconomic History  . Marital status: Married    Spouse name: Not on file  . Number of children: 2  . Years of education: Not on file  . Highest education level: Not on file  Occupational History  . Occupation: Optician, dispensing: RETIRED  Tobacco Use  . Smoking status: Former Smoker    Packs/day: 2.00    Years: 25.00    Pack years: 50.00    Types: Cigarettes    Quit date: 02/22/1986    Years since quitting: 33.3  . Smokeless tobacco: Never Used  Substance and Sexual Activity  . Alcohol use: Yes    Alcohol/week: 6.0 standard drinks    Types: 6 Cans of beer per week  . Drug use: No  . Sexual activity: Not Currently  Other Topics Concern  . Not on file  Social History Narrative  . Not on file   Social Determinants of Health   Financial Resource Strain:   . Difficulty of Paying Living Expenses:   Food Insecurity:   . Worried About Charity fundraiser in the Last Year:   . Arboriculturist in the Last Year:   Transportation Needs:   . Film/video editor (Medical):   Marland Kitchen  Lack of Transportation (Non-Medical):   Physical Activity:   . Days of Exercise per Week:   . Minutes of Exercise per Session:   Stress:   . Feeling of Stress :   Social Connections:   . Frequency of Communication with Friends and  Family:   . Frequency of Social Gatherings with Friends and Family:   . Attends Religious Services:   . Active Member of Clubs or Organizations:   . Attends Archivist Meetings:   Marland Kitchen Marital Status:   Intimate Partner Violence:   . Fear of Current or Ex-Partner:   . Emotionally Abused:   Marland Kitchen Physically Abused:   . Sexually Abused:      Review of Systems    General:  No chills, fever, night sweats or weight changes.  Cardiovascular:  No chest pain, dyspnea on exertion, edema, orthopnea, palpitations, paroxysmal nocturnal dyspnea. Dermatological: No rash, lesions/masses Respiratory: No cough, dyspnea Urologic: No hematuria, dysuria Abdominal:   No nausea, vomiting, diarrhea, bright red blood per rectum, melena, or hematemesis Neurologic:  No visual changes, wkns, changes in mental status. All other systems reviewed and are otherwise negative except as noted above.  Physical Exam    VS:  BP 125/70   Pulse (!) 56   Temp (!) 95.7 F (35.4 C)   Ht 5\' 9"  (1.753 m)   Wt 171 lb (77.6 kg)   SpO2 98%   BMI 25.25 kg/m  , BMI Body mass index is 25.25 kg/m. GEN: Well nourished, well developed, in no acute distress. HEENT: normal. Neck: Supple, no JVD, carotid bruits, or masses. Cardiac: Irregularly irregular, no murmurs, rubs, or gallops. No clubbing, cyanosis, generalized lower extremity nonpitting edema.  Radials/DP/PT 2+ and equal bilaterally.  Respiratory:  Respirations regular and unlabored, clear to auscultation bilaterally. GI: Soft, nontender, nondistended, BS + x 4. MS: no deformity or atrophy. Skin: warm and dry, no rash. Neuro:  Strength and sensation are intact. Psych: Normal affect.  Accessory Clinical Findings    ECG personally  reviewed by me today-none today.  EKG 06/25/2019 Atrial fibrillation with slow ventricular response 50 bpm  EKG 06/10/2019 Atrial fibrillation with slow ventricular response nonspecific T wave abnormality 58 bpm.  EKG 07/03/2017 Sinus rhythm first-degree AV block 68 bpm  Echocardiogram 04/02/2016 Study Conclusions   - Left ventricle: The cavity size was mildly dilated. Wall  thickness was normal. Systolic function was mildly to moderately  reduced. The estimated ejection fraction was in the range of 40%  to 45%. There is akinesis of the inferolateral myocardium.  - Mitral valve: Calcified annulus. There was mild regurgitation.  - Left atrium: The atrium was moderately dilated.  - Pulmonary arteries: Systolic pressure was moderately increased.  PA peak pressure: 51 mm Hg (S).   Impressions:   - Akinesis of the inferolateral wall with overall mild to moderate  LV dysfunction; mild MR; moderate LAE; mild TR; moderately  elevated pulmonary pressure.   Assessment & Plan   1.  Atrial fibrillation-heart rate today 56. Seen on 06/25/2019 and plan was made to further diurese and then try repeat DCCV. Status post successful DCCV 05/05/2016. CHA2DS2-VASc score 6.  Has not missed any doses of his apixaban. Continue apixaban 5 mg twice daily Continue carvedilol 12.5 mg twice daily DCCV scheduled for 413  The patient understands that risks include but are not limited to stroke (1 in 1000), death (1 in 1000), kidney failure [usually temporary] (1 in 500), bleeding (1 in 200), allergic reaction [possibly serious] (1 in 200), and agrees to proceed.    Lower extremity edema/ chronic combined systolic and diastolic CHF-bilateral lower extremity generalized nonpitting edema. Weight today 171 pounds down from 187 pounds on 06/03/2019. Continue lower extremity support stockings Elevate extremities when not active Increase physical activity as  tolerated-15 minutes daily walking. Daily  weights-log given Blood pressure log Heart healthy low-sodium diet-salty 6 reviewed Continue Lasix40 mg Monday Wednesday Friday mg daily 20 mg other days of the week. Continue potassium10 mEq daily Order BMP  Coronary artery disease-no chest pain today. Redo CABG in 2005 after emergent stenting of his left main. His surgery included a free RIMA graft to the OM, SVG to diagonal, and SVG to PDA. His LIMA to LAD or intact from his original surgery. He has also had an old anterior MI. Continue aspirin 81 mg tablet daily Continue carvedilol 12.5 mg twice daily Continue losartan 50 mg tablet daily Continue nitroglycerin 0.4mg  sublingual as needed Continue simvastatin 20 mg tablet daily Heart healthy low-sodium diet Increase physical activity as tolerated   Hyperlipidemia-LDL 43 04/02/2016 Continue simvastatin 20mg   Tablet daily Heart healthy low-sodium high-fiber diet Increase physical activity as tolerated  Disposition follow-up with  Dr. Martinique after DCCV.  Jossie Ng. [facility] [address] Office [phone] Fax [phone]

## 2019-07-05 ENCOUNTER — Encounter: Payer: Self-pay | Admitting: General Practice

## 2019-07-05 ENCOUNTER — Other Ambulatory Visit: Payer: Self-pay | Admitting: *Deleted

## 2019-07-05 ENCOUNTER — Other Ambulatory Visit: Payer: Self-pay | Admitting: General Practice

## 2019-07-05 ENCOUNTER — Other Ambulatory Visit (HOSPITAL_COMMUNITY): Payer: Medicare Other

## 2019-07-05 ENCOUNTER — Ambulatory Visit (INDEPENDENT_AMBULATORY_CARE_PROVIDER_SITE_OTHER): Payer: Medicare Other | Admitting: General Practice

## 2019-07-05 VITALS — BP 125/70 | HR 56 | Temp 95.7°F | Ht 69.0 in | Wt 171.0 lb

## 2019-07-05 DIAGNOSIS — E785 Hyperlipidemia, unspecified: Secondary | ICD-10-CM

## 2019-07-05 DIAGNOSIS — R600 Localized edema: Secondary | ICD-10-CM | POA: Diagnosis not present

## 2019-07-05 DIAGNOSIS — I25810 Atherosclerosis of coronary artery bypass graft(s) without angina pectoris: Secondary | ICD-10-CM | POA: Diagnosis not present

## 2019-07-05 DIAGNOSIS — I4891 Unspecified atrial fibrillation: Secondary | ICD-10-CM

## 2019-07-05 DIAGNOSIS — I5042 Chronic combined systolic (congestive) and diastolic (congestive) heart failure: Secondary | ICD-10-CM

## 2019-07-05 DIAGNOSIS — I6523 Occlusion and stenosis of bilateral carotid arteries: Secondary | ICD-10-CM

## 2019-07-05 DIAGNOSIS — I739 Peripheral vascular disease, unspecified: Secondary | ICD-10-CM

## 2019-07-05 NOTE — Patient Instructions (Addendum)
Medication Instructions:  The current medical regimen is effective;  continue present plan and medications as directed. Please refer to the Current Medication list given to you today. *If you need a refill on your cardiac medications before your next appointment, please call your pharmacy*  Lab Work: BMET TODAY If you have labs (blood work) drawn today and your tests are completely normal, you will receive your results only by:  [place] (if you have MyChart) OR A paper copy in the mail.  If you have any lab test that is abnormal or we need to change your treatment, we will call you to review the results.  Testing/Procedures: Your physician has recommended that you have a Cardioversion (DCCV).   Electrical Cardioversion uses a jolt of electricity to your heart either through paddles or wired patches attached to your chest. This is a controlled, usually prescheduled, procedure. Defibrillation is done under light anesthesia in the hospital, and you usually go home the day of the procedure. This is done to get your heart back into a normal rhythm. You are not awake for the procedure. Please see the instruction sheet given to you today.   YOUR COVID TESTING IS SCHEDULED Saturday 07-06-2019 @1050AM   Special Instructions INCREASE PHYSICAL ACTIVITY AS TOLERATED  COVID TESTING TODAY AT [facility] [address]  PLEASE READ AND FOLLOW SALTY 6-ATTACHED  TAKE AND LOG YOUR BLOOD PRESSURE AND WEIGHT DAILY  Follow-Up: Your next appointment:  AFTER DCCV Peter Martinique, MD [place], FNP-C  At [facility], you and your health needs are our priority.  As part of our continuing mission to provide you with exceptional heart care, we have created designated Provider Care Teams.  These Care Teams include your primary Cardiologist (physician) and Advanced Practice Providers (APPs -  Physician Assistants and Nurse Practitioners) who all work together to provide you with the care you  need, when you need it.  We recommend signing up for the patient portal called "MyChart".  Sign up information is provided on this After Visit Summary.  MyChart is used to connect with patients for Virtual Visits (Telemedicine).  Patients are able to view lab/test results, encounter notes, upcoming appointments, etc.  Non-urgent messages can be sent to your provider as well.   To learn more about what you can do with MyChart, go to [email or web address].

## 2019-07-06 ENCOUNTER — Other Ambulatory Visit (HOSPITAL_COMMUNITY)
Admission: RE | Admit: 2019-07-06 | Discharge: 2019-07-06 | Disposition: A | Discharge status: Home / Self Care | Payer: Medicare Other | Source: Ambulatory Visit | Attending: Cardiovascular Disease | Admitting: Cardiovascular Disease

## 2019-07-06 DIAGNOSIS — Z20822 Contact with and (suspected) exposure to covid-19: Secondary | ICD-10-CM | POA: Diagnosis not present

## 2019-07-06 DIAGNOSIS — Z01812 Encounter for preprocedural laboratory examination: Secondary | ICD-10-CM | POA: Insufficient documentation

## 2019-07-06 LAB — BASIC METABOLIC PANEL
BUN/Creatinine Ratio: 18 (ref 10–24)
BUN: 23 mg/dL (ref 8–27)
CO2: 24 mmol/L (ref 20–29)
Calcium: 9.5 mg/dL (ref 8.6–10.2)
Chloride: 101 mmol/L (ref 96–106)
Creatinine, Ser: 1.28 mg/dL — ABNORMAL HIGH (ref 0.76–1.27)
GFR calc Af Amer: 62 mL/min/{1.73_m2} (ref >59)
GFR calc non Af Amer: 53 mL/min/{1.73_m2} — ABNORMAL LOW (ref >59)
Glucose: 90 mg/dL (ref 65–99)
Potassium: 4.5 mmol/L (ref 3.5–5.2)
Sodium: 138 mmol/L (ref 134–144)

## 2019-07-06 LAB — SARS CORONAVIRUS 2 (TAT 6-24 HRS): SARS Coronavirus 2: NEGATIVE

## 2019-07-08 ENCOUNTER — Telehealth: Payer: Self-pay | Admitting: Cardiology

## 2019-07-08 NOTE — Telephone Encounter (Signed)
[address] [place] [address]   Dear Mr. Leno,  Dear Early Chars. [place]  You are scheduled for a Cardioversion on 07-09-2019 with Dr. Johnsie Cancel.  Please arrive at the [facility] (Main Entrance A) at [facility]: [address] [place],  [address] at 9 am. (1 hour prior to procedure unless lab work is needed; if lab work is needed arrive 1.5 hours ahead)  DIET: Nothing to eat or drink after midnight except a sip of water with medications (see medication instructions below)  Medication Instructions: Hold Lasix  Continue your anticoagulant: Eliquis  Labs: ALREADY DONE.  You must have a responsible person to drive you home and stay in the waiting area during your procedure. Failure to do so could result in cancellation.  Bring your insurance cards.  *Special Note: Every effort is made to have your procedure done on time. Occasionally there are emergencies that occur at the hospital that may cause delays. Please be patient if a delay does occur.    If you have any questions or concerns, please don't hesitate to call.     07-08-19 @145PM  NOTIFIED PT OF ABOVE. PT VERBALIZED UNDERSTANDING.  APOLOGIZED FOR INCORRECT INFORMATION BEING GIVEN AND PT STATES THAT "THESE THINGS HAPPEN. 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 PT VERBALIZED UNDERSTANDING AND THANKS FOR THE CALL.

## 2019-07-08 NOTE — Telephone Encounter (Signed)
Patient wanted to clarify some pre-procedure instructions. He is scheduled to have a cardioversion done tomorrow and was told to take a dose of lopressor 2 hrs prior to the procedure. He normally does not take that medication and wanted to make sure it was not an error

## 2019-07-08 NOTE — Addendum Note (Signed)
Addended by: Deberah Pelton on: 07/08/2019 09:04 AM   Modules accepted: Orders

## 2019-07-08 NOTE — Progress Notes (Unsigned)
  [address] [place] [address]   Dear Mr. Denno,  Dear Early Chars. Tien  You are scheduled for a Cardioversion on 07-09-2019 with Dr. Johnsie Cancel.  Please arrive at the [facility] (Main Entrance A) at [facility]: [address] [place], [place] [address] at 9 am. (1 hour prior to procedure unless lab work is needed; if lab work is needed arrive 1.5 hours ahead)  DIET: Nothing to eat or drink after midnight except a sip of water with medications (see medication instructions below)  Medication Instructions: Hold Lasix  Continue your anticoagulant: Eliquis  Labs: ALREADY DONE.  You must have a responsible person to drive you home and stay in the waiting area during your procedure. Failure to do so could result in cancellation.  Bring your insurance cards.  *Special Note: Every effort is made to have your procedure done on time. Occasionally there are emergencies that occur at the hospital that may cause delays. Please be patient if a delay does occur.    If you have any questions or concerns, please don't hesitate to call.

## 2019-07-09 ENCOUNTER — Encounter (HOSPITAL_COMMUNITY): Payer: Self-pay | Admitting: Cardiovascular Disease

## 2019-07-09 ENCOUNTER — Ambulatory Visit (HOSPITAL_COMMUNITY): Payer: Medicare Other | Admitting: Certified Registered Nurse Anesthetist

## 2019-07-09 ENCOUNTER — Encounter (HOSPITAL_COMMUNITY)
Admission: RE | Disposition: A | Discharge status: Home / Self Care | Payer: Self-pay | Source: Home / Self Care | Attending: Cardiovascular Disease

## 2019-07-09 ENCOUNTER — Other Ambulatory Visit: Payer: Self-pay

## 2019-07-09 ENCOUNTER — Ambulatory Visit (HOSPITAL_COMMUNITY)
Admission: RE | Admit: 2019-07-09 | Discharge: 2019-07-09 | Disposition: A | Discharge status: Home / Self Care | Payer: Medicare Other | Attending: Cardiovascular Disease | Admitting: Cardiovascular Disease

## 2019-07-09 DIAGNOSIS — R609 Edema, unspecified: Secondary | ICD-10-CM | POA: Insufficient documentation

## 2019-07-09 DIAGNOSIS — I4891 Unspecified atrial fibrillation: Secondary | ICD-10-CM | POA: Diagnosis not present

## 2019-07-09 DIAGNOSIS — N400 Enlarged prostate without lower urinary tract symptoms: Secondary | ICD-10-CM | POA: Insufficient documentation

## 2019-07-09 DIAGNOSIS — I480 Paroxysmal atrial fibrillation: Secondary | ICD-10-CM | POA: Diagnosis present

## 2019-07-09 DIAGNOSIS — I5032 Chronic diastolic (congestive) heart failure: Secondary | ICD-10-CM | POA: Diagnosis not present

## 2019-07-09 DIAGNOSIS — Z79899 Other long term (current) drug therapy: Secondary | ICD-10-CM | POA: Diagnosis not present

## 2019-07-09 DIAGNOSIS — I429 Cardiomyopathy, unspecified: Secondary | ICD-10-CM | POA: Insufficient documentation

## 2019-07-09 DIAGNOSIS — E1142 Type 2 diabetes mellitus with diabetic polyneuropathy: Secondary | ICD-10-CM | POA: Insufficient documentation

## 2019-07-09 DIAGNOSIS — I2510 Atherosclerotic heart disease of native coronary artery without angina pectoris: Secondary | ICD-10-CM | POA: Insufficient documentation

## 2019-07-09 DIAGNOSIS — Z87891 Personal history of nicotine dependence: Secondary | ICD-10-CM | POA: Diagnosis not present

## 2019-07-09 DIAGNOSIS — Z7901 Long term (current) use of anticoagulants: Secondary | ICD-10-CM | POA: Diagnosis not present

## 2019-07-09 DIAGNOSIS — I110 Hypertensive heart disease with heart failure: Secondary | ICD-10-CM | POA: Insufficient documentation

## 2019-07-09 DIAGNOSIS — Z85820 Personal history of malignant melanoma of skin: Secondary | ICD-10-CM | POA: Diagnosis not present

## 2019-07-09 DIAGNOSIS — Z951 Presence of aortocoronary bypass graft: Secondary | ICD-10-CM | POA: Diagnosis not present

## 2019-07-09 DIAGNOSIS — Z7982 Long term (current) use of aspirin: Secondary | ICD-10-CM | POA: Insufficient documentation

## 2019-07-09 DIAGNOSIS — E1151 Type 2 diabetes mellitus with diabetic peripheral angiopathy without gangrene: Secondary | ICD-10-CM | POA: Diagnosis not present

## 2019-07-09 DIAGNOSIS — E785 Hyperlipidemia, unspecified: Secondary | ICD-10-CM | POA: Insufficient documentation

## 2019-07-09 DIAGNOSIS — Z7984 Long term (current) use of oral hypoglycemic drugs: Secondary | ICD-10-CM | POA: Diagnosis not present

## 2019-07-09 DIAGNOSIS — I252 Old myocardial infarction: Secondary | ICD-10-CM | POA: Diagnosis not present

## 2019-07-09 HISTORY — PX: CARDIOVERSION: SHX1299

## 2019-07-09 SURGERY — CARDIOVERSION
Anesthesia: General

## 2019-07-09 MED ORDER — PROPOFOL 10 MG/ML IV BOLUS
INTRAVENOUS | Status: DC | PRN
  Administered 2019-07-09: 50 mg via INTRAVENOUS

## 2019-07-09 MED ORDER — SODIUM CHLORIDE 0.9 % IV SOLN: INTRAVENOUS | Status: DC | PRN

## 2019-07-09 MED ORDER — LIDOCAINE 2% (20 MG/ML) 5 ML SYRINGE
INTRAMUSCULAR | Status: DC | PRN
  Administered 2019-07-09: 20 mg via INTRAVENOUS

## 2019-07-09 MED ORDER — CARVEDILOL 12.5 MG PO TABS: 6.2500 mg | ORAL_TABLET | Freq: Two times a day (BID) | ORAL | 2 refills | Status: DC

## 2019-07-09 NOTE — Anesthesia Procedure Notes (Signed)
Procedure Name: General with mask airway Date/Time: 07/09/2019 10:14 AM Performed by: Janace Litten, CRNA Pre-anesthesia Checklist: Patient identified, Emergency Drugs available, Suction available, Patient being monitored and Timeout performed Patient Re-evaluated:Patient Re-evaluated prior to induction Oxygen Delivery Method: Ambu bag Preoxygenation: Pre-oxygenation with 100% oxygen Induction Type: IV induction

## 2019-07-09 NOTE — Transfer of Care (Signed)
Immediate Anesthesia Transfer of Care Note  Patient: [name]  Procedure(s) Performed: CARDIOVERSION (N/A )  Patient Location: PACU  Anesthesia Type:General  Level of Consciousness: drowsy and patient cooperative  Airway & Oxygen Therapy: Patient Spontanous Breathing  Post-op Assessment: Report given to RN and Post -op Vital signs reviewed and stable  Post vital signs: Reviewed and stable  Last Vitals:  Vitals Value Taken Time  BP    Temp    Pulse    Resp    SpO2      Last Pain:  Vitals:   07/09/19 0926  TempSrc: Oral  PainSc: 0-No pain         Complications: No apparent anesthesia complications

## 2019-07-09 NOTE — Discharge Instructions (Signed)
Electrical Cardioversion Electrical cardioversion is the delivery of a jolt of electricity to restore a normal rhythm to the heart. A rhythm that is too fast or is not regular keeps the heart from pumping well. In this procedure, sticky patches or metal paddles are placed on the chest to deliver electricity to the heart from a device. This procedure may be done in an emergency if:  There is low or no blood pressure as a result of the heart rhythm.  Normal rhythm must be restored as fast as possible to protect the brain and heart from further damage.  It may save a life. This may also be a scheduled procedure for irregular or fast heart rhythms that are not immediately life-threatening. Tell a health care provider about:  Any allergies you have.  All medicines you are taking, including vitamins, herbs, eye drops, creams, and over-the-counter medicines.  Any problems you or family members have had with anesthetic medicines.  Any blood disorders you have.  Any surgeries you have had.  Any medical conditions you have.  Whether you are pregnant or may be pregnant. What are the risks? Generally, this is a safe procedure. However, problems may occur, including:  Allergic reactions to medicines.  A blood clot that breaks free and travels to other parts of your body.  The possible return of an abnormal heart rhythm within hours or days after the procedure.  Your heart stopping (cardiac arrest). This is rare. What happens before the procedure? Medicines  Your health care provider may have you start taking: ? Blood-thinning medicines (anticoagulants) so your blood does not clot as easily. ? Medicines to help stabilize your heart rate and rhythm.  Ask your health care provider about: ? Changing or stopping your regular medicines. This is especially important if you are taking diabetes medicines or blood thinners. ? Taking medicines such as aspirin and ibuprofen. These medicines can  thin your blood. Do not take these medicines unless your health care provider tells you to take them. ? Taking over-the-counter medicines, vitamins, herbs, and supplements. General instructions  Follow instructions from your health care provider about eating or drinking restrictions.  Plan to have someone take you home from the hospital or clinic.  If you will be going home right after the procedure, plan to have someone with you for 24 hours.  Ask your health care provider what steps will be taken to help prevent infection. These may include washing your skin with a germ-killing soap. What happens during the procedure?   An IV will be inserted into one of your veins.  Sticky patches (electrodes) or metal paddles may be placed on your chest.  You will be given a medicine to help you relax (sedative).  An electrical shock will be delivered. The procedure may vary among health care providers and hospitals. What can I expect after the procedure?  Your blood pressure, heart rate, breathing rate, and blood oxygen level will be monitored until you leave the hospital or clinic.  Your heart rhythm will be watched to make sure it does not change.  You may have some redness on the skin where the shocks were given. Follow these instructions at home:  Do not drive for 24 hours if you were given a sedative during your procedure.  Take over-the-counter and prescription medicines only as told by your health care provider.  Ask your health care provider how to check your pulse. Check it often.  Rest for 48 hours after the procedure or   as told by your health care provider.  Avoid or limit your caffeine use as told by your health care provider.  Keep all follow-up visits as told by your health care provider. This is important. Contact a health care provider if:  You feel like your heart is beating too quickly or your pulse is not regular.  You have a serious muscle cramp that does not go  away. Get help right away if:  You have discomfort in your chest.  You are dizzy or you feel faint.  You have trouble breathing or you are short of breath.  Your speech is slurred.  You have trouble moving an arm or leg on one side of your body.  Your fingers or toes turn cold or blue. Summary  Electrical cardioversion is the delivery of a jolt of electricity to restore a normal rhythm to the heart.  This procedure may be done right away in an emergency or may be a scheduled procedure if the condition is not an emergency.  Generally, this is a safe procedure.  After the procedure, check your pulse often as told by your health care provider.

## 2019-07-09 NOTE — CV Procedure (Signed)
[facility]: Anesthesia: Dr Glennon Mac Propofol  [place] x 1 120 J converted from afib rate 54 to NSR rate 56 bpm No missed doses of eliquis   Digoxin d/c already Spoke with wife to decrease coreg to 1/2 tab bid and not give dose today  No immediate neurologic sequelae  Jenkins Rouge MD [facility]

## 2019-07-09 NOTE — Anesthesia Preprocedure Evaluation (Addendum)
Anesthesia Evaluation  Patient identified by MRN, date of birth, ID band Patient awake    Reviewed: Allergy & Precautions, NPO status , reviewed documented beta blocker date and time , Unable to perform ROS - Chart review only  History of Anesthesia Complications Negative for: history of anesthetic complications  Airway Mallampati: II  TM Distance: >3 FB Neck ROM: Full    Dental  (+) Poor Dentition, Dental Advisory Given   Pulmonary former smoker,  07/06/2019 SARS coronavirus NEG   breath sounds clear to auscultation       Cardiovascular hypertension, Pt. on medications and Pt. on home beta blockers (-) angina+ CAD, + Past MI, + Cardiac Stents, + CABG and + Peripheral Vascular Disease (s/p fem-fem)   Rhythm:Irregular Rate:Normal  '18 ECHO: EF 40-45%, Akinesis of the inferolateral wall with overall mild to moderate LV dysfunction; mild MR; moderate LAE; mild TR; moderately elevated pulmonary pressure.    Neuro/Psych Anxiety    GI/Hepatic negative GI ROS, Neg liver ROS,   Endo/Other  diabetes, Oral Hypoglycemic Agents  Renal/GU negative Renal ROS     Musculoskeletal  (+) Arthritis ,   Abdominal   Peds  Hematology eliquis   Anesthesia Other Findings   Reproductive/Obstetrics                            Anesthesia Physical Anesthesia Plan  ASA: III  Anesthesia Plan: General   Post-op Pain Management:    Induction: Intravenous  PONV Risk Score and Plan: 2 and Treatment may vary due to age or medical condition  Airway Management Planned: Natural Airway and Mask  Additional Equipment:   Intra-op Plan:   Post-operative Plan:   Informed Consent: I have reviewed the patients History and Physical, chart, labs and discussed the procedure including the risks, benefits and alternatives for the proposed anesthesia with the patient or authorized representative who has indicated his/her  understanding and acceptance.     Dental advisory given  Plan Discussed with: CRNA and Surgeon  Anesthesia Plan Comments:        Anesthesia Quick Evaluation

## 2019-07-09 NOTE — Anesthesia Postprocedure Evaluation (Signed)
Anesthesia Post Note  Patient: [name]  Procedure(s) Performed: CARDIOVERSION (N/A )     Patient location during evaluation: Endoscopy Anesthesia Type: General Level of consciousness: awake and alert, oriented and patient cooperative Pain management: pain level controlled Vital Signs Assessment: post-procedure vital signs reviewed and stable Respiratory status: spontaneous breathing, nonlabored ventilation and respiratory function stable Cardiovascular status: blood pressure returned to baseline and stable Postop Assessment: no apparent nausea or vomiting Anesthetic complications: no    Last Vitals:  Vitals:   07/09/19 1035 07/09/19 1045  BP: (!) 150/44 (!) 160/48  Pulse: (!) 55 (!) 54  Resp: (!) 23 19  Temp:    SpO2: 100% 98%    Last Pain:  Vitals:   07/09/19 1025  TempSrc:   PainSc: 0-No pain                 [place],E. Yovanna Cogan

## 2019-07-09 NOTE — Interval H&P Note (Signed)
History and Physical Interval Note:  07/09/2019 10:00 AM  [name]  has presented today for surgery, with the diagnosis of AFIB WITH RVR.  The various methods of treatment have been discussed with the patient and family. After consideration of risks, benefits and other options for treatment, the patient has consented to  Procedure(s): CARDIOVERSION (N/A) as a surgical intervention.  The patient's history has been reviewed, patient examined, no change in status, stable for surgery.  I have reviewed the patient's chart and labs.  Questions were answered to the patient's satisfaction.     Jenkins Rouge

## 2019-07-10 ENCOUNTER — Encounter: Payer: Self-pay | Admitting: *Deleted

## 2019-08-04 NOTE — Progress Notes (Signed)
Cardiology Clinic Note   Patient Name: [name] Date of Encounter: 08/05/2019  Primary Care Provider:  Lawerance Cruel, MD Primary Cardiologist:  Peter Martinique, MD  Patient Profile    Kyle L McCuen34 year old male presents today for further evaluation of his atrial fibrillation.  Past Medical History    Past Medical History:  Diagnosis Date  . Acute inferior myocardial infarction (HCC)    hx  . Acute on chronic diastolic CHF (congestive heart failure), NYHA class 1 ([place]) 08/09/2014  . Anemia   . BPH (benign prostatic hyperplasia)   . CAD (coronary artery disease)   . Dyslipidemia   . History of blood transfusion    "I've had one; don't remember when"  . HTN (hypertension)   . Hyponatremia   . 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 Myocardial infarction [facility]) x2  1986, 2005  . 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 PAD (peripheral artery disease) ([place])   . Type II diabetes mellitus ([place])    Past Surgical History:  Procedure Laterality Date  . CARDIAC CATHETERIZATION  12/23/2014   Procedure: Left Heart Cath and Cors/Grafts Angiography;  Surgeon: Peter M Martinique, MD;  Location: [place] CV LAB;  Service: Cardiovascular;;  . CARDIOVERSION N/A 05/05/2016   Procedure: CARDIOVERSION;  Surgeon: Lelon Perla, MD;  Location: [facility] ENDOSCOPY;  Service: Cardiovascular;  Laterality: N/A;  . CARDIOVERSION N/A 07/09/2019   Procedure: CARDIOVERSION;  Surgeon: Josue Hector, MD;  Location: [place];  Service: Cardiovascular;  Laterality: N/A;  . CATARACT EXTRACTION W/ INTRAOCULAR LENS  IMPLANT, BILATERAL Bilateral   . CORONARY ANGIOPLASTY WITH STENT PLACEMENT    . CORONARY ARTERY BYPASS GRAFT  1986; redo 2005   ; free Rima to OM, svg-diag,svg-pda  . ENDARTERECTOMY FEMORAL Bilateral 12/31/2014   Procedure: BILATERAL FEMORAL ENDARTERECTOMY;  Surgeon: Elam Dutch, MD;  Location: [place];  Service: Vascular;  Laterality: Bilateral;  . FEMORAL-FEMORAL  BYPASS GRAFT Bilateral 12/31/2014   Procedure: FEMORAL-FEMORAL ARTERY BYPASS GRAFT;  Surgeon: Elam Dutch, MD;  Location: [place];  Service: Vascular;  Laterality: Bilateral;  . KYPHOPLASTY  02/29/2012   Procedure: KYPHOPLASTY;  Surgeon: Sinclair Ship, MD;  Location: [place];  Service: Orthopedics;  Laterality: Bilateral;  T 10 kyphoplasty  . LAPAROTOMY N/A 08/02/2014   Procedure: EXPLORATORY LAPAROTOMY LYSIS OF ADHESIONS;  Surgeon: Donnie Mesa, MD;  Location: [place];  Service: General;  Laterality: N/A;  . MELANOMA EXCISION     scalp  . PERIPHERAL VASCULAR CATHETERIZATION N/A 12/05/2014   Procedure: Abdominal Aortogram;  Surgeon: Elam Dutch, MD;  Location: [place] [facility];  Service: Cardiovascular;  Laterality: N/A;  . ROTATOR CUFF REPAIR Right   . TONSILLECTOMY      Allergies  Allergies  Allergen Reactions  . Hctz [Hydrochlorothiazide] Other (See Comments)    Hypokalemia     History of Present Illness    [name] has a PMH of coronary artery disease status post redo coronary artery bypass in 2005 after emergent stenting of his left main. His surgery included a free RIMA graft to the OM, SVG to diagonal, and SVG to PDA. His LIMA to LAD or intact from his original surgery. He has also had an old anterior MI. April 2016 he was admitted with shortness of breath and underwent surgery with exploratory lap and lysis of adhesions. His postop course was complicated by marked volume overload due to IV fluids with an over 20  pound weight gain. He was diuresed. His echocardiogram showed an EF of 45%. In August 2016 he presented with progressive claudication in his left hip and leg. Dopplers showed severe PAD. Angiography was completed by Dr. Oneida Alar and showed severe left iliac and bilateral common femoral disease. He had an abnormal Myoview study and cardiac catheterization was done which showed patent grafts. He underwent surgery by Dr. Oneida Alar with bilateral femoral  endarterectomy and femorofemoral BPG.  He was admitted 1/4-04/03/16 with a URI, CHF, new diagnosis of atrial fibrillation. He was rate controlled with carvedilol and digoxin. Bradycardia with Cardizem. He was started on Eliquis and Plavix was continued. His TSH was normal, EF 40-45% his CHA2DS2-VASc score was 6. (Age x2, hypertension, diabetes, CAD, CHF). He was anticoagulated for 4 weeks and underwent DCCV 05/05/2016. Losartan was held for elevated creatinine. He later returned to normal and his losartan was resumed at 50 mg daily.  His last seen by Dr. Martinique on 09/25/2018. During that time he was doing well. He was able to walk up to 45 minutes at a time without chest discomfort, dyspnea, or significant claudication. He did have some weakness in his left leg but denied edema and palpitations.  He contacted nurse triage line on 05/30/2019 and indicated that he had lower extremity swelling and tightness. He had not been weighing himself daily or elevating his lower extremities. He was instructed to start daily weightsat that time.  He presented3/8/2021for follow-up and statedfor thepast2 weeks he had increased lower extremity swelling. His left lower extremitywaslarger than his right. He hadnot been weighing daily, oreating a low-salt diet. He statedthat he received a Covid shot about 12 days ago and waswondering if it wasrelated to the injection. He continuedto walk for 20 minutes acouple times a week.   He presented to the clinic 06/10/2019 for follow-up and statedhe was starting to feel better. He had lost 6 pounds over the last week. He had no side effects with the furosemide or potassium. He did feel he has quite a bit of lower extremity edema. He had been following a low-sodium diet and was now wearing lower extremity compression stockings. I will continued his furosemide 20 mg daily and 10 mEq of potassium.   He followed up with [name] PA-C on  06/25/2019 and was noted to be in atrial fibrillation with slow ventricular response.  His furosemide was increased to 40 mg Monday Wednesday Friday and he was instructed to take 20 mg on the other days his Lanoxin was stopped.  His cardioversion schedule Dr. Johnsie Cancel on 07/09/2019.  He presented for follow-up 4 /9/21 and stated he felt well.  He had been increasing his physical activity and had been walking 15 minutes daily.  He was trying to eat a low-sodium diet.  He had been compliant with his lower extremity support stockings.  Overall he stated he felt much much better.  He remained cardiac unaware.  He underwent DCCV on 07/09/2019 with shock x1 of 120 J.  He was converted from atrial fibrillation to normal sinus rhythm rate was 56 bpm.  His digoxin was discontinued at that time.  His carvedilol was also decreased to half tab twice daily.  He tolerated procedure well.  He presents the clinic today for follow-up and states he feels well.  He has been walking daily for 30 minutes.  He remains cardiac unaware.  His weight is stable and he has been compliant with his lower extremity support stockings.  Unfortunately, he is back in  atrial fibrillation today rate of 64.  I have discussed his options with medication, and evaluation by electrophysiology.  I will consult electrophysiology and have him follow-up with Dr. Martinique in 3 months.  Today hedenies chest pain, shortness of breath,increasedlower extremity edema, fatigue, palpitations, melena, hematuria, hemoptysis, diaphoresis, weakness, presyncope, syncope, orthopnea, and PND.  Home Medications    Prior to Admission medications   Medication Sig Start Date End Date Taking? Authorizing Provider  ALPRAZolam Duanne Moron) 0.5 MG tablet Take 0.5 mg by mouth daily.  11/30/17   [provider]  apixaban (ELIQUIS) 5 MG TABS tablet Take 1 tablet (5 mg total) by mouth 2 (two) times daily. 04/03/16   Regalado, Belkys A, MD  aspirin 81 MG tablet Take 81 mg by  mouth every other day.     [provider]  carvedilol (COREG) 12.5 MG tablet Take 0.5 tablets (6.25 mg total) by mouth 2 (two) times daily with a meal. 07/09/19   Josue Hector, MD  furosemide (LASIX) 20 MG tablet Take 2 tablets (40 mg total) by mouth daily. 40 mg MON, WED&FRI 20MG  TUE,THUR Patient taking differently: Take 20-40 mg by mouth See admin instructions. 40 mg MON, WED&FRI;  20MG  TUE,THUR 06/25/19 07/25/19  Croitoru, Mihai, MD  glimepiride (AMARYL) 4 MG tablet Take 4 mg by mouth daily at 2 PM.     [provider]  iron polysaccharides (NIFEREX) 150 MG capsule Take 150 mg by mouth at bedtime.     [provider]  losartan (COZAAR) 50 MG tablet Take 50 mg by mouth daily.    [provider]  nitroGLYCERIN (NITROSTAT) 0.4 MG SL tablet PLACE 1 TABLET (0.4 MG TOTAL) UNDER THE TONGUE EVERY 5 (FIVE) MINUTES AS NEEDED FOR CHEST PAIN. 01/04/16   Martinique, Peter M, MD  pioglitazone (ACTOS) 45 MG tablet Take 45 mg by mouth daily.  04/13/12   [provider]  potassium chloride (KLOR-CON) 10 MEQ tablet Take 1 tablet (10 mEq total) by mouth daily. 06/25/19   Croitoru, Mihai, MD  simvastatin (ZOCOR) 20 MG tablet Take 1 tablet (20 mg total) by mouth at bedtime. 06/25/19   Croitoru, Dani Gobble, MD    Family History    Family History  Problem Relation Age of Onset  . Dementia Mother    He indicated that his mother is deceased. He indicated that his father is deceased. He indicated that his sister is alive. He indicated that only one of his two brothers is alive. He indicated that his maternal grandmother is deceased. He indicated that his maternal grandfather is deceased. He indicated that his paternal grandmother is deceased. He indicated that his paternal grandfather is deceased.  Social History    Social History   Socioeconomic History  . Marital status: Married    Spouse name: Not on file  . Number of children: 2  . Years of education: Not on file  .  Highest education level: Not on file  Occupational History  . Occupation: Optician, dispensing: RETIRED  Tobacco Use  . Smoking status: Former Smoker    Packs/day: 2.00    Years: 25.00    Pack years: 50.00    Types: Cigarettes    Quit date: 02/22/1986    Years since quitting: 33.4  . Smokeless tobacco: Never Used  Substance and Sexual Activity  . Alcohol use: Yes    Alcohol/week: 6.0 standard drinks    Types: 6 Cans of beer per week  . Drug use: No  .  Sexual activity: Not Currently  Other Topics Concern  . Not on file  Social History Narrative  . Not on file   Social Determinants of Health   Financial Resource Strain:   . Difficulty of Paying Living Expenses:   Food Insecurity:   . Worried About Charity fundraiser in the Last Year:   . Arboriculturist in the Last Year:   Transportation Needs:   . Film/video editor (Medical):   Marland Kitchen Lack of Transportation (Non-Medical):   Physical Activity:   . Days of Exercise per Week:   . Minutes of Exercise per Session:   Stress:   . Feeling of Stress :   Social Connections:   . Frequency of Communication with Friends and Family:   . Frequency of Social Gatherings with Friends and Family:   . Attends Religious Services:   . Active Member of Clubs or Organizations:   . Attends Archivist Meetings:   Marland Kitchen Marital Status:   Intimate Partner Violence:   . Fear of Current or Ex-Partner:   . Emotionally Abused:   Marland Kitchen Physically Abused:   . Sexually Abused:      Review of Systems    General:  No chills, fever, night sweats or weight changes.  Cardiovascular:  No chest pain, dyspnea on exertion, edema, orthopnea, palpitations, paroxysmal nocturnal dyspnea. Dermatological: No rash, lesions/masses Respiratory: No cough, dyspnea Urologic: No hematuria, dysuria Abdominal:   No nausea, vomiting, diarrhea, bright red blood per rectum, melena, or hematemesis Neurologic:  No visual changes, wkns, changes in mental status. All  other systems reviewed and are otherwise negative except as noted above.  Physical Exam    VS:  BP (!) 136/53   Pulse 61   Temp (!) 97.3 F (36.3 C)   Resp 20   Ht 5\' 9"  (1.753 m)   Wt 171 lb 12.8 oz (77.9 kg)   SpO2 98%   BMI 25.37 kg/m  , BMI Body mass index is 25.37 kg/m. GEN: Well nourished, well developed, in no acute distress. HEENT: normal. Neck: Supple, no JVD, carotid bruits, or masses. Cardiac: Irregularly irregular, no murmurs, rubs, or gallops. No clubbing, cyanosis, generalized nonpitting bilateral lower extremity edema.  Radials/DP/PT 2+ and equal bilaterally.  Respiratory:  Respirations regular and unlabored, clear to auscultation bilaterally. GI: Soft, nontender, nondistended, BS + x 4. MS: no deformity or atrophy. Skin: warm and dry, no rash. Neuro:  Strength and sensation are intact. Psych: Normal affect.  Accessory Clinical Findings    ECG personally reviewed by me today-atrial fibrillation 64 bpm  EKG 06/25/2019 Atrial fibrillation with slow ventricular response 50 bpm  EKG 06/10/2019 Atrial fibrillation with slow ventricular response nonspecific T wave abnormality 58 bpm.  EKG 07/03/2017 Sinus rhythm first-degree AV block 68 bpm  Echocardiogram 04/02/2016 Study Conclusions   - Left ventricle: The cavity size was mildly dilated. Wall  thickness was normal. Systolic function was mildly to moderately  reduced. The estimated ejection fraction was in the range of 40%  to 45%. There is akinesis of the inferolateral myocardium.  - Mitral valve: Calcified annulus. There was mild regurgitation.  - Left atrium: The atrium was moderately dilated.  - Pulmonary arteries: Systolic pressure was moderately increased.  PA peak pressure: 51 mm Hg (S).   Impressions:   - Akinesis of the inferolateral wall with overall mild to moderate  LV dysfunction; mild MR; moderate LAE; mild TR; moderately  elevated pulmonary pressure.  Assessment & Plan  1.  Atrial fibrillation-EKG today shows atrial fibrillation 64 bpm.   General successful cardioversion with 1 shock at 120 J on 07/09/2019. CHA2DS2-VASc score 6.  Has not missed any doses of his apixaban. Continue apixaban 5 mg twice daily Continue carvedilol 12.5 mg twice daily Avoid triggers caffeine, chocolate, EtOH etc.  Lower extremity edema/ chronic combined systolic and diastolic CHF-bilateral lower extremity generalized nonpitting edema. Weight today 171 poundsdown from 187 pounds on 06/03/2019. Continue lower extremity support stockings Elevate extremities when not active Increase physical activity as tolerated-15 minutes daily walking. Daily weights Blood pressure log Heart healthy low-sodium diet-salty 6reviewed ContinueLasix40 mg Monday Wednesday Friday mgdaily 20 mg other days of the week. Continue potassium10 mEqdaily   Coronary artery disease-no chest pain today. Redo CABG in 2005 after emergent stenting of his left main. His surgery included a free RIMA graft to the OM, SVG to diagonal, and SVG to PDA. His LIMA to LAD or intact from his original surgery. He has also had an old anterior MI. Continue aspirin 81 mg tablet daily Continue carvedilol 12.5 mg twice daily Continue losartan 50 mg tablet daily Continue nitroglycerin 0.4mg  sublingual as needed Continue simvastatin 20 mg tablet daily Heart healthy low-sodium diet Increase physical activity as tolerated   Hyperlipidemia-LDL 43 04/02/2016 Continue simvastatin 20mg   Tablet daily Heart healthy low-sodium high-fiber diet Increase physical activity as tolerated  Disposition follow-up with EP in 1 month and Dr. Martinique in 3 months.  Jossie Ng. Kea Callan NP-C    08/05/2019, 3:14 PM [address] Office [phone] Fax [phone]

## 2019-08-05 ENCOUNTER — Encounter: Payer: Self-pay | Admitting: General Practice

## 2019-08-05 ENCOUNTER — Ambulatory Visit: Payer: Medicare Other | Admitting: General Practice

## 2019-08-05 ENCOUNTER — Other Ambulatory Visit: Payer: Self-pay

## 2019-08-05 VITALS — BP 136/53 | HR 61 | Temp 97.3°F | Resp 20 | Ht 69.0 in | Wt 171.8 lb

## 2019-08-05 DIAGNOSIS — E785 Hyperlipidemia, unspecified: Secondary | ICD-10-CM

## 2019-08-05 DIAGNOSIS — I25810 Atherosclerosis of coronary artery bypass graft(s) without angina pectoris: Secondary | ICD-10-CM | POA: Diagnosis not present

## 2019-08-05 DIAGNOSIS — I5042 Chronic combined systolic (congestive) and diastolic (congestive) heart failure: Secondary | ICD-10-CM | POA: Diagnosis not present

## 2019-08-05 DIAGNOSIS — I4891 Unspecified atrial fibrillation: Secondary | ICD-10-CM | POA: Diagnosis not present

## 2019-08-05 NOTE — Patient Instructions (Signed)
Medication Instructions:  NO CHANGE *If you need a refill on your cardiac medications before your next appointment, please call your pharmacy*   Lab Work: If you have labs (blood work) drawn today and your tests are completely normal, you will receive your results only by: Marland Kitchen MyChart Message (if you have MyChart) OR . A paper copy in the mail If you have any lab test that is abnormal or we need to change your treatment, we will call you to review the results.  Follow-Up: At [facility], you and your health needs are our priority.  As part of our continuing mission to provide you with exceptional heart care, we have created designated Provider Care Teams.  These Care Teams include your primary Cardiologist (physician) and Advanced Practice Providers (APPs -  Physician Assistants and Nurse Practitioners) who all work together to provide you with the care you need, when you need it.  We recommend signing up for the patient portal called "MyChart".  Sign up information is provided on this After Visit Summary.  MyChart is used to connect with patients for Virtual Visits (Telemedicine).  Patients are able to view lab/test results, encounter notes, upcoming appointments, etc.  Non-urgent messages can be sent to your provider as well.   To learn more about what you can do with MyChart, go to [email or web address].    Your next appointment:   3 month(s)  The format for your next appointment:   Either In Person or Virtual  Provider:   You may see Peter Martinique, MD or one of the following Advanced Practice Providers on your designated Care Team:    Almyra Deforest, PA-C  Fabian Sharp, PA-C or   Roby Lofts, PA-C    Other Instructions REFERRAL TO EP IN [place] TO DISCUSS ATRIAL FIB

## 2019-08-19 ENCOUNTER — Other Ambulatory Visit: Payer: Self-pay | Admitting: Cardiology

## 2019-08-19 NOTE — Telephone Encounter (Signed)
 *  STAT* If patient is at the pharmacy, call can be transferred to refill team.   1. Which medications need to be refilled? (please list name of each medication and dose if known)   furosemide (LASIX) 20 MG tablet  2. Which pharmacy/location (including street and city if local pharmacy) is medication to be sent to?  [id number] [place] [place] [address]  3. Do they need a 30 day or 90 day supply? 90 days  Patient has one dose left. Please call patient when prescription is sent to pharmacy. He states pharmacy never calls him.

## 2019-08-20 MED ORDER — FUROSEMIDE 20 MG PO TABS: 40.0000 mg | ORAL_TABLET | Freq: Every day | ORAL | 3 refills | Status: DC

## 2019-08-20 NOTE — Addendum Note (Signed)
Addended by: Marlane Hatcher on: 08/20/2019 03:26 PM   Modules accepted: Orders

## 2019-08-27 ENCOUNTER — Telehealth: Payer: Self-pay | Admitting: General Practice

## 2019-08-27 MED ORDER — FUROSEMIDE 20 MG PO TABS: 40.0000 mg | ORAL_TABLET | Freq: Every day | ORAL | 3 refills | Status: DC

## 2019-08-27 NOTE — Telephone Encounter (Signed)
Spoke with patient. Clarified Lasix order and refill was sent to pharmacy as requested for a 3 month supply with refills.

## 2019-08-27 NOTE — Telephone Encounter (Signed)
Attempted to call patient back X3. Phone number listed will not work. Voicemail left on wife's cell phone.

## 2019-08-27 NOTE — Telephone Encounter (Signed)
Fadel is returning [place] call. Please advise.

## 2019-08-27 NOTE — Telephone Encounter (Signed)
New message   Patient has a question about the dosage of this medication furosemide (LASIX) 20 MG tablet. Please call to discuss.Patient states that he believes that he was shorted medication and the pharmacy states that it was filled exactly as stated on the prescription and patient states that he was advised to call the prescribing provider.

## 2019-09-12 ENCOUNTER — Other Ambulatory Visit: Payer: Self-pay

## 2019-09-12 ENCOUNTER — Ambulatory Visit (INDEPENDENT_AMBULATORY_CARE_PROVIDER_SITE_OTHER): Payer: Medicare Other | Admitting: Internal Medicine

## 2019-09-12 ENCOUNTER — Encounter: Payer: Self-pay | Admitting: Internal Medicine

## 2019-09-12 VITALS — BP 114/72 | HR 69 | Ht 69.0 in | Wt 172.8 lb

## 2019-09-12 DIAGNOSIS — I4891 Unspecified atrial fibrillation: Secondary | ICD-10-CM | POA: Diagnosis not present

## 2019-09-12 NOTE — Patient Instructions (Signed)
Medication Instructions:  Your physician recommends that you continue on your current medications as directed. Please refer to the Current Medication list given to you today.  *If you need a refill on your cardiac medications before your next appointment, please call your pharmacy*   Lab Work: NONE   If you have labs (blood work) drawn today and your tests are completely normal, you will receive your results only by: Marland Kitchen MyChart Message (if you have MyChart) OR . A paper copy in the mail If you have any lab test that is abnormal or we need to change your treatment, we will call you to review the results.   Testing/Procedures: NONE  Follow-Up: At [facility], you and your health needs are our priority.  As part of our continuing mission to provide you with exceptional heart care, we have created designated Provider Care Teams.  These Care Teams include your primary Cardiologist (physician) and Advanced Practice Providers (APPs -  Physician Assistants and Nurse Practitioners) who all work together to provide you with the care you need, when you need it.  We recommend signing up for the patient portal called "MyChart".  Sign up information is provided on this After Visit Summary.  MyChart is used to connect with patients for Virtual Visits (Telemedicine).  Patients are able to view lab/test results, encounter notes, upcoming appointments, etc.  Non-urgent messages can be sent to your provider as well.   To learn more about what you can do with MyChart, go to [email or web address].    Your next appointment:    As Needed   The format for your next appointment:   In Person  Provider:   Cristopher Peru, MD   Other Instructions Please call back it you would like to start Dofetilide   Thank you for choosing [facility]!

## 2019-09-12 NOTE — Progress Notes (Signed)
HPI [name] is referred today for followup. He is a pleasant 79 yo man with persistent atrial fib. He was cardioverted in April and had ERAF. He feels more fatigued in atrial fib. He does not have chest pain though his energy level is reduced in atrial fib. He has minimal palpitations. No palpitations. He has know CAD and mild LV dysfunction. He has peripheral vascular disease and is s/p bilateral femoral endarterectomy. He notes that his ability to exercise is reduced when he goes into atrial fib. Allergies  Allergen Reactions  . Hctz [Hydrochlorothiazide] Other (See Comments)    Hypokalemia      Current Outpatient Medications  Medication Sig Dispense Refill  . ALPRAZolam (XANAX) 1 MG tablet Take 1 mg by mouth daily as needed for anxiety.    Marland Kitchen apixaban (ELIQUIS) 5 MG TABS tablet Take 1 tablet (5 mg total) by mouth 2 (two) times daily. 60 tablet 0  . aspirin 81 MG tablet Take 81 mg by mouth every other day.     . carvedilol (COREG) 12.5 MG tablet Take 0.5 tablets (6.25 mg total) by mouth 2 (two) times daily with a meal. 180 tablet 2  . furosemide (LASIX) 20 MG tablet Take 2 tablets (40 mg total) by mouth daily. 40 mg MON, WED&FRI 20MG  on all other days 120 tablet 3  . glimepiride (AMARYL) 4 MG tablet Take 4 mg by mouth daily at 2 PM.     . iron polysaccharides (NIFEREX) 150 MG capsule Take 150 mg by mouth at bedtime.     Marland Kitchen losartan (COZAAR) 50 MG tablet Take 50 mg by mouth daily.    . nitroGLYCERIN (NITROSTAT) 0.4 MG SL tablet PLACE 1 TABLET (0.4 MG TOTAL) UNDER THE TONGUE EVERY 5 (FIVE) MINUTES AS NEEDED FOR CHEST PAIN. 25 tablet 2  . pioglitazone (ACTOS) 45 MG tablet Take 45 mg by mouth daily.     . potassium chloride (KLOR-CON) 10 MEQ tablet Take 1 tablet (10 mEq total) by mouth daily. 90 tablet 3  . simvastatin (ZOCOR) 20 MG tablet Take 1 tablet (20 mg total) by mouth at bedtime. 90 tablet 3   No current facility-administered medications for this visit.     Past Medical  History:  Diagnosis Date  . Acute inferior myocardial infarction (HCC)    hx  . Acute on chronic diastolic CHF (congestive heart failure), NYHA class 1 ([place]) 08/09/2014  . Anemia   . BPH (benign prostatic hyperplasia)   . CAD (coronary artery disease)   . Dyslipidemia   . History of blood transfusion    "I've had one; don't remember when"  . HTN (hypertension)   . Hyponatremia   . 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 Myocardial infarction [facility]) x2  1986, 2005  . 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 PAD (peripheral artery disease) ([place])   . Type II diabetes mellitus (HCC)     ROS:   All systems reviewed and negative except as noted in the HPI.   Past Surgical History:  Procedure Laterality Date  . CARDIAC CATHETERIZATION  12/23/2014   Procedure: Left Heart Cath and Cors/Grafts Angiography;  Surgeon: Peter M Martinique, MD;  Location: [place] CV LAB;  Service: Cardiovascular;;  . CARDIOVERSION N/A 05/05/2016   Procedure: CARDIOVERSION;  Surgeon: Lelon Perla, MD;  Location: [facility] ENDOSCOPY;  Service: Cardiovascular;  Laterality: N/A;  . CARDIOVERSION N/A 07/09/2019   Procedure: CARDIOVERSION;  Surgeon: Johnsie Cancel,  Wallis Bamberg, MD;  Location: [place];  Service: Cardiovascular;  Laterality: N/A;  . CATARACT EXTRACTION W/ INTRAOCULAR LENS  IMPLANT, BILATERAL Bilateral   . CORONARY ANGIOPLASTY WITH STENT PLACEMENT    . CORONARY ARTERY BYPASS GRAFT  1986; redo 2005   ; free Rima to OM, svg-diag,svg-pda  . ENDARTERECTOMY FEMORAL Bilateral 12/31/2014   Procedure: BILATERAL FEMORAL ENDARTERECTOMY;  Surgeon: Elam Dutch, MD;  Location: [place];  Service: Vascular;  Laterality: Bilateral;  . FEMORAL-FEMORAL BYPASS GRAFT Bilateral 12/31/2014   Procedure: FEMORAL-FEMORAL ARTERY BYPASS GRAFT;  Surgeon: Elam Dutch, MD;  Location: [place];  Service: Vascular;  Laterality: Bilateral;  . KYPHOPLASTY  02/29/2012   Procedure: KYPHOPLASTY;  Surgeon: Sinclair Ship, MD;   Location: [place];  Service: Orthopedics;  Laterality: Bilateral;  T 10 kyphoplasty  . LAPAROTOMY N/A 08/02/2014   Procedure: EXPLORATORY LAPAROTOMY LYSIS OF ADHESIONS;  Surgeon: Donnie Mesa, MD;  Location: [place];  Service: General;  Laterality: N/A;  . MELANOMA EXCISION     scalp  . PERIPHERAL VASCULAR CATHETERIZATION N/A 12/05/2014   Procedure: Abdominal Aortogram;  Surgeon: Elam Dutch, MD;  Location: [place] CV LAB;  Service: Cardiovascular;  Laterality: N/A;  . ROTATOR CUFF REPAIR Right   . TONSILLECTOMY       Family History  Problem Relation Age of Onset  . Dementia Mother      Social History   Socioeconomic History  . Marital status: Married    Spouse name: Not on file  . Number of children: 2  . Years of education: Not on file  . Highest education level: Not on file  Occupational History  . Occupation: Optician, dispensing: RETIRED  Tobacco Use  . Smoking status: Former Smoker    Packs/day: 2.00    Years: 25.00    Pack years: 50.00    Types: Cigarettes    Quit date: 02/22/1986    Years since quitting: 33.5  . Smokeless tobacco: Never Used  Vaping Use  . Vaping Use: Never used  Substance and Sexual Activity  . Alcohol use: Yes    Alcohol/week: 6.0 standard drinks    Types: 6 Cans of beer per week  . Drug use: No  . Sexual activity: Not Currently  Other Topics Concern  . Not on file  Social History Narrative  . Not on file   Social Determinants of Health   Financial Resource Strain:   . Difficulty of Paying Living Expenses:   Food Insecurity:   . Worried About Charity fundraiser in the Last Year:   . Arboriculturist in the Last Year:   Transportation Needs:   . Film/video editor (Medical):   Marland Kitchen Lack of Transportation (Non-Medical):   Physical Activity:   . Days of Exercise per Week:   . Minutes of Exercise per Session:   Stress:   . Feeling of Stress :   Social Connections:   . Frequency of Communication with Friends and Family:     . Frequency of Social Gatherings with Friends and Family:   . Attends Religious Services:   . Active Member of Clubs or Organizations:   . Attends Archivist Meetings:   Marland Kitchen Marital Status:   Intimate Partner Violence:   . Fear of Current or Ex-Partner:   . Emotionally Abused:   Marland Kitchen Physically Abused:   . Sexually Abused:      BP 114/72   Pulse 69   Ht 5'  9" (1.753 m)   Wt 172 lb 12.8 oz (78.4 kg)   SpO2 96%   BMI 25.52 kg/m   Physical Exam:  Well appearing NAD HEENT: Unremarkable Neck:  No JVD, no thyromegally Lymphatics:  No adenopathy Back:  No CVA tenderness Lungs:  Clear with no wheezes HEART:  IRegular rate rhythm, no murmurs, no rubs, no clicks Abd:  soft, positive bowel sounds, no organomegally, no rebound, no guarding Ext:  2 plus pulses, no edema, no cyanosis, no clubbing Skin:  No rashes no nodules Neuro:  CN II through XII intact, motor grossly intact  EKG - atrial fib with a controlled VR   Assess/Plan: 1. Atrial fib - he is symptomatic but only minimally so. I have discussed the treatment options in detail including medical therapy and ablation. His advanced age, and comorbidities do not make him a good candidate for atrial fib ablation. We discussed dofetilide and he will speak to his family. He feels poorly in atrial fib. We discussed amiodarone. We will hold of on dofetilide and call us if he would like to be admitted for admit.  2. HTN - his bp is controled. 3. CAD - he denies anginal symptoms.   Mikle Bosworth.D.

## 2019-09-18 ENCOUNTER — Telehealth: Payer: Self-pay | Admitting: Internal Medicine

## 2019-09-18 NOTE — Telephone Encounter (Signed)
Pt notified that Dr. Lovena Le would be delayed in responding d/t not being in the office at this time. Pt voiced understanding and asked to be called once the meds have been called in.

## 2019-09-18 NOTE — Telephone Encounter (Signed)
Pt states Dr.Taylor offered him a medication by Hospital admission or a medication that can be called to Pharmacy. Pt is asking that [facility]  Please call to confirm [phone]   Thanks renee

## 2019-09-18 NOTE — Telephone Encounter (Signed)
Start amiodarone 200 mg twice daily. I would like to see  Back in 5-6 weeks.

## 2019-09-19 MED ORDER — AMIODARONE HCL 200 MG PO TABS: 200.0000 mg | ORAL_TABLET | Freq: Two times a day (BID) | ORAL | 0 refills | Status: DC

## 2019-09-19 NOTE — Telephone Encounter (Signed)
Apt made with Dr.Taylor for 11/13/19 at 8:30 am, already e-scribed amiodarone to pharmacy, await pt call

## 2019-09-19 NOTE — Addendum Note (Signed)
Addended by: Barbarann Ehlers A on: 09/19/2019 09:17 AM   Modules accepted: Orders

## 2019-09-19 NOTE — Telephone Encounter (Signed)
[id number] 

## 2019-09-20 NOTE — Telephone Encounter (Signed)
Pt notified of medication and appt with Dr. Lovena Le.

## 2019-09-23 ENCOUNTER — Ambulatory Visit: Payer: Medicare Other | Admitting: Cardiology

## 2019-09-25 ENCOUNTER — Telehealth: Payer: Self-pay | Admitting: Internal Medicine

## 2019-09-25 NOTE — Telephone Encounter (Signed)
Returned call to Pt.  Advised that he did not need to be hospitalized for amiodarone.  Discussed some of the issues with amiodarone:  Thyroid, liver, lungs.  Pt was not concerned about these issues, he had concerns about gynecomastia.  Advised this is a potential side effect of amiodarone but is very rare.  Pt thanked nurse for call back.

## 2019-09-25 NOTE — Telephone Encounter (Signed)
Pt c/o medication issue:  1. Name of Medication: amiodarone (PACERONE) 200 MG tablet 2. How are you currently taking this medication (dosage and times per day)? n/a  3. Are you having a reaction (difficulty breathing--STAT)? no  4. What is your medication issue? Patient states that he is to have an ablation. He was told he would need to start one of three medications. Two of them required hospitalization prior to taking and one did not. He states that he wanted to take the medication that did not require hospitalization so he was prescribed Amiodarone. When he picked up the medication, the instructions state that he will need to be in the hospital prior to taking it. He is confused. He states that he does not want to confront Dr. Lovena Le about this and would like Dr. Martinique to weigh in on the matter. Routing to NL triage beings that he asked me not to send to Dr. Lovena Le, Please advise.

## 2019-11-08 NOTE — Progress Notes (Signed)
[name] Date of Birth: September 21, 1940 Medical Record #[id number]  History of Present Illness: [name] is seen for follow up Afib.  He has a history of coronary disease and is status post redo coronary bypass surgery in 2005 after emergent stenting of the left main. This included a free RIMA graft to the OM, SVG to diagonal, and SVG to PDA. LIMA to LAD was intact from original surgery. He has had an old anterior myocardial infarction.  In April 2016 he was admitted with SBO and underwent surgery with exploratory lap and lysis of adhesions. His post op course was complicated by marked volume overload due to IVF with over 20 lb weight gain. He was diuresed. Echo showed EF 45%.  In August 2016 he presented with progressive claudication in the left hip and leg. Dopplers showed severe PAD. Angiography was done by Dr. Oneida Alar and showed severe left iliac and bilateral common femoral disease. He had an abnormal Myoview study and cardiac cath was done and showed patent grafts.  He underwent surgery by Dr. Oneida Alar with bilateral femoral endarterectomy and fem- fem BPG.   Admit 01/04-01/07/18 w/ URI, CHF, new dx atrial fib. Rate control w/ Coreg & dig. Bradycardia w/ Cardizem. Started on Eliquis and Plavix discontinued. TSH nl, EF 40-45% CHA2DS2VASc=6 (age x 2, HTN, DM, CAD, CHF). He was anticoagulated for 4 weeks and underwent DCCV on 05/05/16. Losartan was held for elevated creatinine. This later returned to normal and losartan resumed at 50 mg daily.   He was seen in March of this year with complaints of increased LE edema. This resolved with lasix. Subsequently he followed up with [facility] on 06/25/2019 and was noted to be in atrial fibrillation with slow ventricular response. His furosemide was increased to 40 mg Monday Wednesday Friday and he was instructed to take 20 mg on the other days his Lanoxin was stopped. he underwent DCCV on  07/09/2019. Unfortunately he had early recurrence of afib. He was  seen by Dr Lovena Le in June. Discussed AAD therapy with Tikosyn versus amiodarone. Not felt to be a candidate for ablation due to age and multiple co morbidities. Patient decided to try amiodarone and was started on po dose 200 mg bid.   LE arterial dopplers in April showed noncompressible vessels. Carotid dopplers showed 40-59% RICA stenosis. 6-96% on LICA.   On follow up today he is doing OK. He does note he doesn't have as much endurance or strength.    No chest pain or dyspnea.  He denies any claudication.  His LE edema is better. Unable to wear compression hose due to allergic reaction. d  No dizziness or orthopnea.   Current Outpatient Medications on File Prior to Visit  Medication Sig Dispense Refill  . ALPRAZolam (XANAX) 1 MG tablet Take 1 mg by mouth daily as needed for anxiety.    Marland Kitchen amiodarone (PACERONE) 200 MG tablet Take 1 tablet (200 mg total) by mouth 2 (two) times daily. 180 tablet 0  . apixaban (ELIQUIS) 5 MG TABS tablet Take 1 tablet (5 mg total) by mouth 2 (two) times daily. 60 tablet 0  . carvedilol (COREG) 12.5 MG tablet Take 0.5 tablets (6.25 mg total) by mouth 2 (two) times daily with a meal. 180 tablet 2  . glimepiride (AMARYL) 4 MG tablet Take 4 mg by mouth daily at 2 PM.     . iron polysaccharides (NIFEREX) 150 MG capsule Take 150 mg by mouth at bedtime.     Marland Kitchen losartan (  COZAAR) 50 MG tablet Take 50 mg by mouth daily.    . nitroGLYCERIN (NITROSTAT) 0.4 MG SL tablet PLACE 1 TABLET (0.4 MG TOTAL) UNDER THE TONGUE EVERY 5 (FIVE) MINUTES AS NEEDED FOR CHEST PAIN. 25 tablet 2  . pioglitazone (ACTOS) 45 MG tablet Take 45 mg by mouth daily.     . potassium chloride (KLOR-CON) 10 MEQ tablet Take 1 tablet (10 mEq total) by mouth daily. 90 tablet 3  . simvastatin (ZOCOR) 20 MG tablet Take 1 tablet (20 mg total) by mouth at bedtime. 90 tablet 3  . furosemide (LASIX) 20 MG tablet Take 2 tablets (40 mg total) by mouth daily. 40 mg MON, WED&FRI 20MG  on all other days 120 tablet 3   No  current facility-administered medications on file prior to visit.    Allergies  Allergen Reactions  . Hctz [Hydrochlorothiazide] Other (See Comments)    Hypokalemia     Past Medical History:  Diagnosis Date  . Acute inferior myocardial infarction (HCC)    hx  . Acute on chronic diastolic CHF (congestive heart failure), NYHA class 1 ([place]) 08/09/2014  . Anemia   . BPH (benign prostatic hyperplasia)   . CAD (coronary artery disease)   . Dyslipidemia   . History of blood transfusion    "I've had one; don't remember when"  . HTN (hypertension)   . Hyponatremia   . 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 Myocardial infarction [facility]) x2  1986, 2005  . 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 PAD (peripheral artery disease) ([place])   . Type II diabetes mellitus ([place])     Past Surgical History:  Procedure Laterality Date  . CARDIAC CATHETERIZATION  12/23/2014   Procedure: Left Heart Cath and Cors/Grafts Angiography;  Surgeon: Tylique Aull M Martinique, MD;  Location: [place] [facility];  Service: Cardiovascular;;  . CARDIOVERSION N/A 05/05/2016   Procedure: CARDIOVERSION;  Surgeon: Lelon Perla, MD;  Location: [facility] ENDOSCOPY;  Service: Cardiovascular;  Laterality: N/A;  . CARDIOVERSION N/A 07/09/2019   Procedure: CARDIOVERSION;  Surgeon: Josue Hector, MD;  Location: [place];  Service: Cardiovascular;  Laterality: N/A;  . CATARACT EXTRACTION W/ INTRAOCULAR LENS  IMPLANT, BILATERAL Bilateral   . CORONARY ANGIOPLASTY WITH STENT PLACEMENT    . CORONARY ARTERY BYPASS GRAFT  1986; redo 2005   ; free Rima to OM, svg-diag,svg-pda  . ENDARTERECTOMY FEMORAL Bilateral 12/31/2014   Procedure: BILATERAL FEMORAL ENDARTERECTOMY;  Surgeon: Elam Dutch, MD;  Location: [place];  Service: Vascular;  Laterality: Bilateral;  . FEMORAL-FEMORAL BYPASS GRAFT Bilateral 12/31/2014   Procedure: FEMORAL-FEMORAL ARTERY BYPASS GRAFT;  Surgeon: Elam Dutch, MD;  Location: [place];  Service:  Vascular;  Laterality: Bilateral;  . KYPHOPLASTY  02/29/2012   Procedure: KYPHOPLASTY;  Surgeon: Sinclair Ship, MD;  Location: [place];  Service: Orthopedics;  Laterality: Bilateral;  T 10 kyphoplasty  . LAPAROTOMY N/A 08/02/2014   Procedure: EXPLORATORY LAPAROTOMY LYSIS OF ADHESIONS;  Surgeon: Donnie Mesa, MD;  Location: [place];  Service: General;  Laterality: N/A;  . MELANOMA EXCISION     scalp  . PERIPHERAL VASCULAR CATHETERIZATION N/A 12/05/2014   Procedure: Abdominal Aortogram;  Surgeon: Elam Dutch, MD;  Location: [place] [facility];  Service: Cardiovascular;  Laterality: N/A;  . ROTATOR CUFF REPAIR Right   . TONSILLECTOMY      Social History   Tobacco Use  Smoking Status Former Smoker  . Packs/day: 2.00  . Years: 25.00  .  Pack years: 50.00  . Types: Cigarettes  . Quit date: 02/22/1986  . Years since quitting: 33.7  Smokeless Tobacco Never Used    Social History   Substance and Sexual Activity  Alcohol Use Yes  . Alcohol/week: 6.0 standard drinks  . Types: 6 Cans of beer per week    Family History  Problem Relation Age of Onset  . Dementia Mother     Review of Systems: As noted in history of present illness.  All other systems were reviewed and are negative.  Physical Exam: BP (!) 109/51   Pulse 63   Ht 5\' 9"  (1.753 m)   Wt 175 lb (79.4 kg)   SpO2 99%   BMI 25.84 kg/m  GENERAL:  Well appearing WM in NAD HEENT:  PERRL, EOMI, sclera are clear. Oropharynx is clear. NECK:  No jugular venous distention, carotid upstroke brisk and symmetric, right carotid bruit, no thyromegaly or adenopathy LUNGS:  Clear to auscultation bilaterally CHEST:  Unremarkable HEART:  IRRR,  PMI not displaced or sustained,S1 and S2 within normal limits, no S3, no S4: no clicks, no rubs, no murmurs ABD:  Soft, nontender. BS +, no masses or bruits. No hepatomegaly, no splenomegaly EXT:  2 + pulses throughout, 1+ edema, no cyanosis no clubbing SKIN:  Warm and dry.  No  rashes NEURO:  Alert and oriented x 3. Cranial nerves II through XII intact. PSYCH:  Cognitively intact   LABORATORY DATA: Lab Results  Component Value Date   WBC 4.3 04/29/2016   HGB 10.6 (L) 04/29/2016   HCT 31.8 (L) 04/29/2016   PLT 144 04/29/2016   GLUCOSE 90 07/05/2019   CHOL 105 04/02/2016   TRIG 54 04/02/2016   HDL 51 04/02/2016   LDLCALC 43 04/02/2016   ALT 15 (L) 12/25/2014   AST 24 12/25/2014   NA 138 07/05/2019   K 4.5 07/05/2019   CL 101 07/05/2019   CREATININE 1.28 (H) 07/05/2019   BUN 23 07/05/2019   CO2 24 07/05/2019   TSH 1.009 03/31/2016   INR 1.41 12/25/2014   HGBA1C 6.5 (H) 04/01/2016   Dated 12/09/16: cholesterol 135, triglycerides 48, HDL 59, LDL 66. A1c 6.6%. Hgb 12. CMET and TSH normal Dated 07/11/19: A1c 6.6%. BUN 23. Creatinine 1.17. otherwise CMET normal. Cholesterol 139, triglycerides 40, HDL 78, LDL 43. Dig level 0.4  ECHO: 04/02/2016 - Left ventricle: The cavity size was mildly dilated. Wall thickness was normal. Systolic function was mildly to moderately reduced. The estimated ejection fraction was in the range of 40% to 45%. There is akinesis of the inferolateral myocardium. - Mitral valve: Calcified annulus. There was mild regurgitation. - Aortic valve: Indexed valve area (VTI): 1.11 cm^2/m^2. Peak velocity ratio of LVOT to aortic valve: 0.71. Mean gradient (S): 5 mm Hg. Peak gradient (S): 9 mm Hg. - Left atrium: The atrium was moderately dilated. - Pulmonary arteries: Systolic pressure was moderately increased. PA peak pressure: 51 mm Hg (S). Impressions: - Akinesis of the inferolateral wall with overall mild to moderate LV dysfunction; mild MR; moderate LAE; mild TR; moderately elevated pulmonary pressure.   Assessment / Plan: 1. Coronary disease status post redo CABG in 2005. Remote anterior myocardial infarction. Prior left main stent. Cardiac cath in September 2016 showed patent grafts. Continue medical therapy.    2. Chronic systolic/diastolic CHF EF 34-74%. He has some edema but improved. Hopefully restoration of NSR will help. Continue current therapy with lasix, Coreg, and losartan  3. Atrial fibrillation s/p DCCV on 05/05/16  and again this year with ERAD. Now on amiodarone.  Mali Vasc score of 6. Will continue Eliquis long term. Seeing Dr Lovena Le this week. Anticipate repeat DCCV on amiodarone.   4. Hyperlipidemia on Zocor. Excellent control  5. Severe PAD- S/p bilateral femoral endarterectomy and fem-fem bypass. Stressed importance of regular aerobic walking. Seen by [facility] in February and LE arterial dopplers and carotid dopplers were stable.   6. DM type 2  7. HTN

## 2019-11-11 ENCOUNTER — Other Ambulatory Visit: Payer: Self-pay

## 2019-11-11 ENCOUNTER — Ambulatory Visit: Payer: Medicare Other | Admitting: Cardiology

## 2019-11-11 ENCOUNTER — Encounter: Payer: Self-pay | Admitting: Cardiology

## 2019-11-11 VITALS — BP 109/51 | HR 63 | Ht 69.0 in | Wt 175.0 lb

## 2019-11-11 DIAGNOSIS — I25810 Atherosclerosis of coronary artery bypass graft(s) without angina pectoris: Secondary | ICD-10-CM | POA: Diagnosis not present

## 2019-11-11 DIAGNOSIS — I5042 Chronic combined systolic (congestive) and diastolic (congestive) heart failure: Secondary | ICD-10-CM

## 2019-11-11 DIAGNOSIS — I4819 Other persistent atrial fibrillation: Secondary | ICD-10-CM

## 2019-11-11 DIAGNOSIS — R600 Localized edema: Secondary | ICD-10-CM

## 2019-11-13 ENCOUNTER — Other Ambulatory Visit: Payer: Self-pay

## 2019-11-13 ENCOUNTER — Ambulatory Visit: Payer: Medicare Other | Admitting: Internal Medicine

## 2019-11-13 ENCOUNTER — Encounter: Payer: Self-pay | Admitting: Internal Medicine

## 2019-11-13 VITALS — BP 102/50 | HR 64 | Ht 69.0 in | Wt 174.0 lb

## 2019-11-13 DIAGNOSIS — L97521 Non-pressure chronic ulcer of other part of left foot limited to breakdown of skin: Secondary | ICD-10-CM | POA: Diagnosis not present

## 2019-11-13 DIAGNOSIS — I4891 Unspecified atrial fibrillation: Secondary | ICD-10-CM

## 2019-11-13 NOTE — Addendum Note (Signed)
Addended by: Levonne Hubert on: 11/13/2019 09:49 AM   Modules accepted: Orders

## 2019-11-13 NOTE — Progress Notes (Signed)
HPI [name] returns today for followup of atrial fib. He is a pleasant 79 yo man with a h/o HTN and CAD who I met 2 months ago for evaluation of persistent atrial fib. His advanced age made him not a good candidate for ablation and I offered him amio vs dofetilide. He did not want to be in the hospital and started amiodarone about 7 weeks ago. He has been on 200 bid. He is stable from a cardiac perspective and his rate is improved. He notes a sore on his left foot, second toe. He denies any trauma. No chest pain or sob.  Allergies  Allergen Reactions  . Hctz [Hydrochlorothiazide] Other (See Comments)    Hypokalemia      Current Outpatient Medications  Medication Sig Dispense Refill  . ALPRAZolam (XANAX) 1 MG tablet Take 1 mg by mouth daily as needed for anxiety.    Marland Kitchen amiodarone (PACERONE) 200 MG tablet Take 1 tablet (200 mg total) by mouth 2 (two) times daily. 180 tablet 0  . apixaban (ELIQUIS) 5 MG TABS tablet Take 1 tablet (5 mg total) by mouth 2 (two) times daily. 60 tablet 0  . carvedilol (COREG) 12.5 MG tablet Take 0.5 tablets (6.25 mg total) by mouth 2 (two) times daily with a meal. 180 tablet 2  . furosemide (LASIX) 20 MG tablet Take 2 tablets (40 mg total) by mouth daily. 40 mg MON, WED&FRI 20MG on all other days 120 tablet 3  . glimepiride (AMARYL) 4 MG tablet Take 4 mg by mouth daily at 2 PM.     . iron polysaccharides (NIFEREX) 150 MG capsule Take 150 mg by mouth at bedtime.     Marland Kitchen losartan (COZAAR) 50 MG tablet Take 50 mg by mouth daily.    . nitroGLYCERIN (NITROSTAT) 0.4 MG SL tablet PLACE 1 TABLET (0.4 MG TOTAL) UNDER THE TONGUE EVERY 5 (FIVE) MINUTES AS NEEDED FOR CHEST PAIN. 25 tablet 2  . pioglitazone (ACTOS) 45 MG tablet Take 45 mg by mouth daily.     . potassium chloride (KLOR-CON) 10 MEQ tablet Take 1 tablet (10 mEq total) by mouth daily. 90 tablet 3  . simvastatin (ZOCOR) 20 MG tablet Take 1 tablet (20 mg total) by mouth at bedtime. 90 tablet 3   No current  facility-administered medications for this visit.     Past Medical History:  Diagnosis Date  . Acute inferior myocardial infarction (HCC)    hx  . Acute on chronic diastolic CHF (congestive heart failure), NYHA class 1 ([place]) 08/09/2014  . Anemia   . BPH (benign prostatic hyperplasia)   . CAD (coronary artery disease)   . Dyslipidemia   . History of blood transfusion    "I've had one; don't remember when"  . HTN (hypertension)   . Hyponatremia   . 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 Myocardial infarction [facility]) x2  1986, 2005  . 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 PAD (peripheral artery disease) ([place])   . Type II diabetes mellitus (HCC)     ROS:   All systems reviewed and negative except as noted in the HPI.   Past Surgical History:  Procedure Laterality Date  . CARDIAC CATHETERIZATION  12/23/2014   Procedure: Left Heart Cath and Cors/Grafts Angiography;  Surgeon: Peter M Martinique, MD;  Location: [place] [facility];  Service: Cardiovascular;;  . CARDIOVERSION N/A 05/05/2016   Procedure: CARDIOVERSION;  Surgeon: Lelon Perla,  MD;  Location: [place];  Service: Cardiovascular;  Laterality: N/A;  . CARDIOVERSION N/A 07/09/2019   Procedure: CARDIOVERSION;  Surgeon: Josue Hector, MD;  Location: [place];  Service: Cardiovascular;  Laterality: N/A;  . CATARACT EXTRACTION W/ INTRAOCULAR LENS  IMPLANT, BILATERAL Bilateral   . CORONARY ANGIOPLASTY WITH STENT PLACEMENT    . CORONARY ARTERY BYPASS GRAFT  1986; redo 2005   ; free Rima to OM, svg-diag,svg-pda  . ENDARTERECTOMY FEMORAL Bilateral 12/31/2014   Procedure: BILATERAL FEMORAL ENDARTERECTOMY;  Surgeon: Elam Dutch, MD;  Location: [place];  Service: Vascular;  Laterality: Bilateral;  . FEMORAL-FEMORAL BYPASS GRAFT Bilateral 12/31/2014   Procedure: FEMORAL-FEMORAL ARTERY BYPASS GRAFT;  Surgeon: Elam Dutch, MD;  Location: [place];  Service: Vascular;  Laterality: Bilateral;  . KYPHOPLASTY   02/29/2012   Procedure: KYPHOPLASTY;  Surgeon: Sinclair Ship, MD;  Location: [place];  Service: Orthopedics;  Laterality: Bilateral;  T 10 kyphoplasty  . LAPAROTOMY N/A 08/02/2014   Procedure: EXPLORATORY LAPAROTOMY LYSIS OF ADHESIONS;  Surgeon: Donnie Mesa, MD;  Location: [place];  Service: General;  Laterality: N/A;  . MELANOMA EXCISION     scalp  . PERIPHERAL VASCULAR CATHETERIZATION N/A 12/05/2014   Procedure: Abdominal Aortogram;  Surgeon: Elam Dutch, MD;  Location: [place] [facility];  Service: Cardiovascular;  Laterality: N/A;  . ROTATOR CUFF REPAIR Right   . TONSILLECTOMY       Family History  Problem Relation Age of Onset  . Dementia Mother      Social History   Socioeconomic History  . Marital status: Married    Spouse name: Not on file  . Number of children: 2  . Years of education: Not on file  . Highest education level: Not on file  Occupational History  . Occupation: Optician, dispensing: RETIRED  Tobacco Use  . Smoking status: Former Smoker    Packs/day: 2.00    Years: 25.00    Pack years: 50.00    Types: Cigarettes    Quit date: 02/22/1986    Years since quitting: 33.7  . Smokeless tobacco: Never Used  Vaping Use  . Vaping Use: Never used  Substance and Sexual Activity  . Alcohol use: Yes    Alcohol/week: 6.0 standard drinks    Types: 6 Cans of beer per week  . Drug use: No  . Sexual activity: Not Currently  Other Topics Concern  . Not on file  Social History Narrative  . Not on file   Social Determinants of Health   Financial Resource Strain:   . Difficulty of Paying Living Expenses:   Food Insecurity:   . Worried About Charity fundraiser in the Last Year:   . Arboriculturist in the Last Year:   Transportation Needs:   . Film/video editor (Medical):   Marland Kitchen Lack of Transportation (Non-Medical):   Physical Activity:   . Days of Exercise per Week:   . Minutes of Exercise per Session:   Stress:   . Feeling of Stress :   Social  Connections:   . Frequency of Communication with Friends and Family:   . Frequency of Social Gatherings with Friends and Family:   . Attends Religious Services:   . Active Member of Clubs or Organizations:   . Attends Archivist Meetings:   Marland Kitchen Marital Status:   Intimate Partner Violence:   . Fear of Current or Ex-Partner:   . Emotionally Abused:   .  Physically Abused:   . Sexually Abused:      BP (!) 102/50   Pulse 64   Ht '5\' 9"'  (1.753 m)   Wt 174 lb (78.9 kg)   SpO2 98%   BMI 25.70 kg/m   Physical Exam:  Well appearing NAD HEENT: Unremarkable Neck:  No JVD, no thyromegally Lymphatics:  No adenopathy Back:  No CVA tenderness Lungs:  Clear with no wheezes HEART:  Regular rate rhythm, no murmurs, no rubs, no clicks Abd:  soft, positive bowel sounds, no organomegally, no rebound, no guarding Ext:  2 plus pulses, no edema, no cyanosis, no clubbing Skin:  No rashes no nodules Neuro:  CN II through XII intact, motor grossly intact  Assess/Plan: 1. Persistent atrial fib - I have asked him to continue his amio 200 bid and undergo DCCV in September. 2. Dyslipidemia - as he is on amio we will stop simvastatin and if he remains on amio long term, we will restart a different statin when I see him back in October. 3. HTN - his bp is well controlled. 4. CAD - he denies anginal symptoms.  5. Toe ulcer - I have asked him to see Dr. Donnetta Hutching.   [name].

## 2019-11-13 NOTE — H&P (View-Only) (Signed)
HPI [name] returns today for followup of atrial fib. He is a pleasant 79 yo man with a h/o HTN and CAD who I met 2 months ago for evaluation of persistent atrial fib. His advanced age made him not a good candidate for ablation and I offered him amio vs dofetilide. He did not want to be in the hospital and started amiodarone about 7 weeks ago. He has been on 200 bid. He is stable from a cardiac perspective and his rate is improved. He notes a sore on his left foot, second toe. He denies any trauma. No chest pain or sob.  Allergies  Allergen Reactions  . Hctz [Hydrochlorothiazide] Other (See Comments)    Hypokalemia      Current Outpatient Medications  Medication Sig Dispense Refill  . ALPRAZolam (XANAX) 1 MG tablet Take 1 mg by mouth daily as needed for anxiety.    Marland Kitchen amiodarone (PACERONE) 200 MG tablet Take 1 tablet (200 mg total) by mouth 2 (two) times daily. 180 tablet 0  . apixaban (ELIQUIS) 5 MG TABS tablet Take 1 tablet (5 mg total) by mouth 2 (two) times daily. 60 tablet 0  . carvedilol (COREG) 12.5 MG tablet Take 0.5 tablets (6.25 mg total) by mouth 2 (two) times daily with a meal. 180 tablet 2  . furosemide (LASIX) 20 MG tablet Take 2 tablets (40 mg total) by mouth daily. 40 mg MON, WED&FRI 20MG on all other days 120 tablet 3  . glimepiride (AMARYL) 4 MG tablet Take 4 mg by mouth daily at 2 PM.     . iron polysaccharides (NIFEREX) 150 MG capsule Take 150 mg by mouth at bedtime.     Marland Kitchen losartan (COZAAR) 50 MG tablet Take 50 mg by mouth daily.    . nitroGLYCERIN (NITROSTAT) 0.4 MG SL tablet PLACE 1 TABLET (0.4 MG TOTAL) UNDER THE TONGUE EVERY 5 (FIVE) MINUTES AS NEEDED FOR CHEST PAIN. 25 tablet 2  . pioglitazone (ACTOS) 45 MG tablet Take 45 mg by mouth daily.     . potassium chloride (KLOR-CON) 10 MEQ tablet Take 1 tablet (10 mEq total) by mouth daily. 90 tablet 3  . simvastatin (ZOCOR) 20 MG tablet Take 1 tablet (20 mg total) by mouth at bedtime. 90 tablet 3   No current  facility-administered medications for this visit.     Past Medical History:  Diagnosis Date  . Acute inferior myocardial infarction (HCC)    hx  . Acute on chronic diastolic CHF (congestive heart failure), NYHA class 1 ([place]) 08/09/2014  . Anemia   . BPH (benign prostatic hyperplasia)   . CAD (coronary artery disease)   . Dyslipidemia   . History of blood transfusion    "I've had one; don't remember when"  . HTN (hypertension)   . Hyponatremia   . 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 Myocardial infarction [facility]) x2  1986, 2005  . 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 PAD (peripheral artery disease) ([place])   . Type II diabetes mellitus (HCC)     ROS:   All systems reviewed and negative except as noted in the HPI.   Past Surgical History:  Procedure Laterality Date  . CARDIAC CATHETERIZATION  12/23/2014   Procedure: Left Heart Cath and Cors/Grafts Angiography;  Surgeon: Peter M Martinique, MD;  Location: [place] [facility];  Service: Cardiovascular;;  . CARDIOVERSION N/A 05/05/2016   Procedure: CARDIOVERSION;  Surgeon: Lelon Perla,  MD;  Location: [place];  Service: Cardiovascular;  Laterality: N/A;  . CARDIOVERSION N/A 07/09/2019   Procedure: CARDIOVERSION;  Surgeon: Josue Hector, MD;  Location: [place];  Service: Cardiovascular;  Laterality: N/A;  . CATARACT EXTRACTION W/ INTRAOCULAR LENS  IMPLANT, BILATERAL Bilateral   . CORONARY ANGIOPLASTY WITH STENT PLACEMENT    . CORONARY ARTERY BYPASS GRAFT  1986; redo 2005   ; free Rima to OM, svg-diag,svg-pda  . ENDARTERECTOMY FEMORAL Bilateral 12/31/2014   Procedure: BILATERAL FEMORAL ENDARTERECTOMY;  Surgeon: Elam Dutch, MD;  Location: [place];  Service: Vascular;  Laterality: Bilateral;  . FEMORAL-FEMORAL BYPASS GRAFT Bilateral 12/31/2014   Procedure: FEMORAL-FEMORAL ARTERY BYPASS GRAFT;  Surgeon: Elam Dutch, MD;  Location: [place];  Service: Vascular;  Laterality: Bilateral;  . KYPHOPLASTY   02/29/2012   Procedure: KYPHOPLASTY;  Surgeon: Sinclair Ship, MD;  Location: [place];  Service: Orthopedics;  Laterality: Bilateral;  T 10 kyphoplasty  . LAPAROTOMY N/A 08/02/2014   Procedure: EXPLORATORY LAPAROTOMY LYSIS OF ADHESIONS;  Surgeon: Donnie Mesa, MD;  Location: [place];  Service: General;  Laterality: N/A;  . MELANOMA EXCISION     scalp  . PERIPHERAL VASCULAR CATHETERIZATION N/A 12/05/2014   Procedure: Abdominal Aortogram;  Surgeon: Elam Dutch, MD;  Location: [place] [facility];  Service: Cardiovascular;  Laterality: N/A;  . ROTATOR CUFF REPAIR Right   . TONSILLECTOMY       Family History  Problem Relation Age of Onset  . Dementia Mother      Social History   Socioeconomic History  . Marital status: Married    Spouse name: Not on file  . Number of children: 2  . Years of education: Not on file  . Highest education level: Not on file  Occupational History  . Occupation: Optician, dispensing: RETIRED  Tobacco Use  . Smoking status: Former Smoker    Packs/day: 2.00    Years: 25.00    Pack years: 50.00    Types: Cigarettes    Quit date: 02/22/1986    Years since quitting: 33.7  . Smokeless tobacco: Never Used  Vaping Use  . Vaping Use: Never used  Substance and Sexual Activity  . Alcohol use: Yes    Alcohol/week: 6.0 standard drinks    Types: 6 Cans of beer per week  . Drug use: No  . Sexual activity: Not Currently  Other Topics Concern  . Not on file  Social History Narrative  . Not on file   Social Determinants of Health   Financial Resource Strain:   . Difficulty of Paying Living Expenses:   Food Insecurity:   . Worried About Charity fundraiser in the Last Year:   . Arboriculturist in the Last Year:   Transportation Needs:   . Film/video editor (Medical):   Marland Kitchen Lack of Transportation (Non-Medical):   Physical Activity:   . Days of Exercise per Week:   . Minutes of Exercise per Session:   Stress:   . Feeling of Stress :   Social  Connections:   . Frequency of Communication with Friends and Family:   . Frequency of Social Gatherings with Friends and Family:   . Attends Religious Services:   . Active Member of Clubs or Organizations:   . Attends Archivist Meetings:   Marland Kitchen Marital Status:   Intimate Partner Violence:   . Fear of Current or Ex-Partner:   . Emotionally Abused:   .  Physically Abused:   . Sexually Abused:      BP (!) 102/50   Pulse 64   Ht '5\' 9"'  (1.753 m)   Wt 174 lb (78.9 kg)   SpO2 98%   BMI 25.70 kg/m   Physical Exam:  Well appearing NAD HEENT: Unremarkable Neck:  No JVD, no thyromegally Lymphatics:  No adenopathy Back:  No CVA tenderness Lungs:  Clear with no wheezes HEART:  Regular rate rhythm, no murmurs, no rubs, no clicks Abd:  soft, positive bowel sounds, no organomegally, no rebound, no guarding Ext:  2 plus pulses, no edema, no cyanosis, no clubbing Skin:  No rashes no nodules Neuro:  CN II through XII intact, motor grossly intact  Assess/Plan: 1. Persistent atrial fib - I have asked him to continue his amio 200 bid and undergo DCCV in September. 2. Dyslipidemia - as he is on amio we will stop simvastatin and if he remains on amio long term, we will restart a different statin when I see him back in October. 3. HTN - his bp is well controlled. 4. CAD - he denies anginal symptoms.  5. Toe ulcer - I have asked him to see Dr. Donnetta Hutching.   Salome Spotted.

## 2019-11-13 NOTE — Patient Instructions (Signed)
Medication Instructions:  Your physician has recommended you make the following change in your medication:  Stop Taking Simvastatin   *If you need a refill on your cardiac medications before your next appointment, please call your pharmacy*   Lab Work: Your physician recommends that you return for lab work in: During Pre op visit   If you have labs (blood work) drawn today and your tests are completely normal, you will receive your results only by: Marland Kitchen MyChart Message (if you have MyChart) OR . A paper copy in the mail If you have any lab test that is abnormal or we need to change your treatment, we will call you to review the results.   Testing/Procedures: Your physician has recommended that you have a Cardioversion (DCCV). Electrical Cardioversion uses a jolt of electricity to your heart either through paddles or wired patches attached to your chest. This is a controlled, usually prescheduled, procedure. Defibrillation is done under light anesthesia in the hospital, and you usually go home the day of the procedure. This is done to get your heart back into a normal rhythm. You are not awake for the procedure. Please see the instruction sheet given to you today.     Follow-Up: At [facility], you and your health needs are our priority.  As part of our continuing mission to provide you with exceptional heart care, we have created designated Provider Care Teams.  These Care Teams include your primary Cardiologist (physician) and Advanced Practice Providers (APPs -  Physician Assistants and Nurse Practitioners) who all work together to provide you with the care you need, when you need it.  We recommend signing up for the patient portal called "MyChart".  Sign up information is provided on this After Visit Summary.  MyChart is used to connect with patients for Virtual Visits (Telemedicine).  Patients are able to view lab/test results, encounter notes, upcoming appointments, etc.  Non-urgent  messages can be sent to your provider as well.   To learn more about what you can do with MyChart, go to [email or web address].    Your next appointment:   2 month(s)  The format for your next appointment:   In Person  Provider:   Cristopher Peru, MD   Other Instructions Thank you for choosing [facility]!

## 2019-11-21 ENCOUNTER — Other Ambulatory Visit: Payer: Self-pay | Admitting: General Practice

## 2019-11-21 NOTE — Telephone Encounter (Signed)
Rx has been sent to the pharmacy electronically. ° °

## 2019-11-26 NOTE — Patient Instructions (Addendum)
[name]  11/26/2019     @PREFPERIOPPHARMACY @   Your procedure is scheduled on  12/05/2019.  Report to [facility] at  0830  A.M.  Call this number if you have problems the morning of surgery:  [phone]   Remember:  Do not eat or drink after midnight.                      Take these medicines the morning of surgery with A SIP OF WATER: Eliquis,  xanax(if needed), amiodarone.    Do not wear jewelry, make-up or nail polish.  Do not wear lotions, powders, or perfumes. Please wear deodorant and brush your teeth.  Do not shave 48 hours prior to surgery.  Men may shave face and neck.  Do not bring valuables to the hospital.  [facility] is not responsible for any belongings or valuables.  Contacts, dentures or bridgework may not be worn into surgery.  Leave your suitcase in the car.  After surgery it may be brought to your room.  For patients admitted to the hospital, discharge time will be determined by your treatment team.  Patients discharged the day of surgery will not be allowed to drive home.   Name and phone number of your driver:   family Special instructions:  DO NOT smoke the morning of your procedure.  Please read over the following fact sheets that you were given. Anesthesia Post-op Instructions and Care and Recovery After Surgery        Electrical Cardioversion Electrical cardioversion is the delivery of a jolt of electricity to restore a normal rhythm to the heart. A rhythm that is too fast or is not regular keeps the heart from pumping well. In this procedure, sticky patches or metal paddles are placed on the chest to deliver electricity to the heart from a device. This procedure may be done in an emergency if:  There is low or no blood pressure as a result of the heart rhythm.  Normal rhythm must be restored as fast as possible to protect the brain and heart from further damage.  It may save a life. This may also be a scheduled procedure for  irregular or fast heart rhythms that are not immediately life-threatening. Tell a health care provider about:  Any allergies you have.  All medicines you are taking, including vitamins, herbs, eye drops, creams, and over-the-counter medicines.  Any problems you or family members have had with anesthetic medicines.  Any blood disorders you have.  Any surgeries you have had.  Any medical conditions you have.  Whether you are pregnant or may be pregnant. What are the risks? Generally, this is a safe procedure. However, problems may occur, including:  Allergic reactions to medicines.  A blood clot that breaks free and travels to other parts of your body.  The possible return of an abnormal heart rhythm within hours or days after the procedure.  Your heart stopping (cardiac arrest). This is rare. What happens before the procedure? Medicines  Your health care provider may have you start taking: ? Blood-thinning medicines (anticoagulants) so your blood does not clot as easily. ? Medicines to help stabilize your heart rate and rhythm.  Ask your health care provider about: ? Changing or stopping your regular medicines. This is especially important if you are taking diabetes medicines or blood thinners. ? Taking medicines such as aspirin and ibuprofen. These medicines can thin your blood. Do not take  these medicines unless your health care provider tells you to take them. ? Taking over-the-counter medicines, vitamins, herbs, and supplements. General instructions  Follow instructions from your health care provider about eating or drinking restrictions.  Plan to have someone take you home from the hospital or clinic.  If you will be going home right after the procedure, plan to have someone with you for 24 hours.  Ask your health care provider what steps will be taken to help prevent infection. These may include washing your skin with a germ-killing soap. What happens during the  procedure?   An IV will be inserted into one of your veins.  Sticky patches (electrodes) or metal paddles may be placed on your chest.  You will be given a medicine to help you relax (sedative).  An electrical shock will be delivered. The procedure may vary among health care providers and hospitals. What can I expect after the procedure?  Your blood pressure, heart rate, breathing rate, and blood oxygen level will be monitored until you leave the hospital or clinic.  Your heart rhythm will be watched to make sure it does not change.  You may have some redness on the skin where the shocks were given. Follow these instructions at home:  Do not drive for 24 hours if you were given a sedative during your procedure.  Take over-the-counter and prescription medicines only as told by your health care provider.  Ask your health care provider how to check your pulse. Check it often.  Rest for 48 hours after the procedure or as told by your health care provider.  Avoid or limit your caffeine use as told by your health care provider.  Keep all follow-up visits as told by your health care provider. This is important. Contact a health care provider if:  You feel like your heart is beating too quickly or your pulse is not regular.  You have a serious muscle cramp that does not go away. Get help right away if:  You have discomfort in your chest.  You are dizzy or you feel faint.  You have trouble breathing or you are short of breath.  Your speech is slurred.  You have trouble moving an arm or leg on one side of your body.  Your fingers or toes turn cold or blue. Summary  Electrical cardioversion is the delivery of a jolt of electricity to restore a normal rhythm to the heart.  This procedure may be done right away in an emergency or may be a scheduled procedure if the condition is not an emergency.  Generally, this is a safe procedure.  After the procedure, check your pulse  often as told by your health care provider. This information is not intended to replace advice given to you by your health care provider. Make sure you discuss any questions you have with your health care provider. Document Revised: 10/15/2018 Document Reviewed: 10/15/2018 Elsevier Patient Education  [place] After These instructions provide you with information about caring for yourself after your procedure. Your health care provider may also give you more specific instructions. Your treatment has been planned according to current medical practices, but problems sometimes occur. Call your health care provider if you have any problems or questions after your procedure. What can I expect after the procedure? After your procedure, you may:  Feel sleepy for several hours.  Feel clumsy and have poor balance for several hours.  Feel forgetful about what happened after  the procedure.  Have poor judgment for several hours.  Feel nauseous or vomit.  Have a sore throat if you had a breathing tube during the procedure. Follow these instructions at home: For at least 24 hours after the procedure:      Have a responsible adult stay with you. It is important to have someone help care for you until you are awake and alert.  Rest as needed.  Do not: ? Participate in activities in which you could fall or become injured. ? Drive. ? Use heavy machinery. ? Drink alcohol. ? Take sleeping pills or medicines that cause drowsiness. ? Make important decisions or sign legal documents. ? Take care of children on your own. Eating and drinking  Follow the diet that is recommended by your health care provider.  If you vomit, drink water, juice, or soup when you can drink without vomiting.  Make sure you have little or no nausea before eating solid foods. General instructions  Take over-the-counter and prescription medicines only as told by your health care  provider.  If you have sleep apnea, surgery and certain medicines can increase your risk for breathing problems. Follow instructions from your health care provider about wearing your sleep device: ? Anytime you are sleeping, including during daytime naps. ? While taking prescription pain medicines, sleeping medicines, or medicines that make you drowsy.  If you smoke, do not smoke without supervision.  Keep all follow-up visits as told by your health care provider. This is important. Contact a health care provider if:  You keep feeling nauseous or you keep vomiting.  You feel light-headed.  You develop a rash.  You have a fever. Get help right away if:  You have trouble breathing. Summary  For several hours after your procedure, you may feel sleepy and have poor judgment.  Have a responsible adult stay with you for at least 24 hours or until you are awake and alert. This information is not intended to replace advice given to you by your health care provider. Make sure you discuss any questions you have with your health care provider. Document Revised: 06/12/2017 Document Reviewed: 07/05/2015 Elsevier Patient Education  [place].

## 2019-12-03 ENCOUNTER — Encounter (HOSPITAL_COMMUNITY): Payer: Self-pay

## 2019-12-03 ENCOUNTER — Encounter (HOSPITAL_COMMUNITY)
Admission: RE | Admit: 2019-12-03 | Discharge: 2019-12-03 | Disposition: A | Discharge status: Home / Self Care | Payer: Medicare Other | Source: Ambulatory Visit | Attending: Cardiology | Admitting: Cardiology

## 2019-12-03 ENCOUNTER — Other Ambulatory Visit (HOSPITAL_COMMUNITY)
Admission: RE | Admit: 2019-12-03 | Discharge: 2019-12-03 | Disposition: A | Discharge status: Home / Self Care | Payer: Medicare Other | Source: Ambulatory Visit | Attending: Cardiology | Admitting: Cardiology

## 2019-12-03 ENCOUNTER — Other Ambulatory Visit: Payer: Self-pay

## 2019-12-03 ENCOUNTER — Other Ambulatory Visit: Payer: Self-pay | Admitting: *Deleted

## 2019-12-03 DIAGNOSIS — Z01812 Encounter for preprocedural laboratory examination: Secondary | ICD-10-CM | POA: Insufficient documentation

## 2019-12-03 LAB — CBC WITH DIFFERENTIAL/PLATELET
Abs Immature Granulocytes: 0.04 10*3/uL (ref 0.00–0.07)
Basophils Absolute: 0 10*3/uL (ref 0.0–0.1)
Basophils Relative: 1 %
Eosinophils Absolute: 0.2 10*3/uL (ref 0.0–0.5)
Eosinophils Relative: 5 %
HCT: 32.6 % — ABNORMAL LOW (ref 39.0–52.0)
Hemoglobin: 11.1 g/dL — ABNORMAL LOW (ref 13.0–17.0)
Immature Granulocytes: 1 %
Lymphocytes Relative: 20 %
Lymphs Abs: 1 10*3/uL (ref 0.7–4.0)
MCH: 32.9 pg (ref 26.0–34.0)
MCHC: 34 g/dL (ref 30.0–36.0)
MCV: 96.7 fL (ref 80.0–100.0)
Monocytes Absolute: 0.5 10*3/uL (ref 0.1–1.0)
Monocytes Relative: 10 %
Neutro Abs: 3.1 10*3/uL (ref 1.7–7.7)
Neutrophils Relative %: 63 %
Platelets: 132 10*3/uL — ABNORMAL LOW (ref 150–400)
RBC: 3.37 MIL/uL — ABNORMAL LOW (ref 4.22–5.81)
RDW: 14.3 % (ref 11.5–15.5)
WBC: 4.9 10*3/uL (ref 4.0–10.5)
nRBC: 0 % (ref 0.0–0.2)

## 2019-12-03 LAB — BASIC METABOLIC PANEL
Anion gap: 12 (ref 5–15)
BUN: 24 mg/dL — ABNORMAL HIGH (ref 8–23)
CO2: 22 mmol/L (ref 22–32)
Calcium: 9.7 mg/dL (ref 8.9–10.3)
Chloride: 102 mmol/L (ref 98–111)
Creatinine, Ser: 1.52 mg/dL — ABNORMAL HIGH (ref 0.61–1.24)
GFR calc Af Amer: 50 mL/min — ABNORMAL LOW (ref >60)
GFR calc non Af Amer: 43 mL/min — ABNORMAL LOW (ref >60)
Glucose, Bld: 105 mg/dL — ABNORMAL HIGH (ref 70–99)
Potassium: 4 mmol/L (ref 3.5–5.1)
Sodium: 136 mmol/L (ref 135–145)

## 2019-12-03 LAB — HEMOGLOBIN A1C
Hgb A1c MFr Bld: 6.9 % — ABNORMAL HIGH (ref 4.8–5.6)
Mean Plasma Glucose: 151.33 mg/dL

## 2019-12-04 ENCOUNTER — Other Ambulatory Visit (HOSPITAL_COMMUNITY)
Admission: RE | Admit: 2019-12-04 | Discharge: 2019-12-04 | Disposition: A | Discharge status: Home / Self Care | Payer: Medicare Other | Source: Ambulatory Visit | Attending: Cardiology | Admitting: Cardiology

## 2019-12-04 DIAGNOSIS — Z20822 Contact with and (suspected) exposure to covid-19: Secondary | ICD-10-CM | POA: Diagnosis not present

## 2019-12-04 DIAGNOSIS — Z01812 Encounter for preprocedural laboratory examination: Secondary | ICD-10-CM | POA: Diagnosis present

## 2019-12-04 LAB — SARS CORONAVIRUS 2 BY RT PCR (HOSPITAL ORDER, PERFORMED IN ~~LOC~~ HOSPITAL LAB): SARS Coronavirus 2: NEGATIVE

## 2019-12-04 NOTE — Progress Notes (Signed)
Called patient and ask him if he could come for a covid retest. Patient agreed to come back today for Covid test. Specimen went to the wrong lab. Should have went to [facility]. Pt.was understandily upset. Verified everything with him. Name, DOB, put the label on the tube, put requisition in bag and went to lab right away. Patient and his wife wanted to know if someone would call him either way. I asked the supervisor in the lab she said only if he's positive. They understood if they didn't get a call on their home phone within an hour, he's negative and would need to show up for his procedure. Nothing further needed.

## 2019-12-05 ENCOUNTER — Ambulatory Visit (HOSPITAL_COMMUNITY): Payer: Medicare Other | Admitting: Certified Registered"

## 2019-12-05 ENCOUNTER — Encounter (HOSPITAL_COMMUNITY)
Admission: RE | Disposition: A | Discharge status: Home / Self Care | Payer: Self-pay | Source: Home / Self Care | Attending: Cardiology

## 2019-12-05 ENCOUNTER — Other Ambulatory Visit: Payer: Self-pay

## 2019-12-05 ENCOUNTER — Encounter (HOSPITAL_COMMUNITY): Payer: Self-pay | Admitting: Cardiology

## 2019-12-05 ENCOUNTER — Ambulatory Visit (HOSPITAL_COMMUNITY)
Admission: RE | Admit: 2019-12-05 | Discharge: 2019-12-05 | Disposition: A | Discharge status: Home / Self Care | Payer: Medicare Other | Attending: Cardiology | Admitting: Cardiology

## 2019-12-05 DIAGNOSIS — I252 Old myocardial infarction: Secondary | ICD-10-CM | POA: Insufficient documentation

## 2019-12-05 DIAGNOSIS — Z820 Family history of epilepsy and other diseases of the nervous system: Secondary | ICD-10-CM | POA: Insufficient documentation

## 2019-12-05 DIAGNOSIS — G709 Myoneural disorder, unspecified: Secondary | ICD-10-CM | POA: Diagnosis not present

## 2019-12-05 DIAGNOSIS — E119 Type 2 diabetes mellitus without complications: Secondary | ICD-10-CM | POA: Diagnosis not present

## 2019-12-05 DIAGNOSIS — E785 Hyperlipidemia, unspecified: Secondary | ICD-10-CM | POA: Insufficient documentation

## 2019-12-05 DIAGNOSIS — I2510 Atherosclerotic heart disease of native coronary artery without angina pectoris: Secondary | ICD-10-CM | POA: Diagnosis not present

## 2019-12-05 DIAGNOSIS — F419 Anxiety disorder, unspecified: Secondary | ICD-10-CM | POA: Insufficient documentation

## 2019-12-05 DIAGNOSIS — I10 Essential (primary) hypertension: Secondary | ICD-10-CM | POA: Insufficient documentation

## 2019-12-05 DIAGNOSIS — Z9841 Cataract extraction status, right eye: Secondary | ICD-10-CM | POA: Insufficient documentation

## 2019-12-05 DIAGNOSIS — Z888 Allergy status to other drugs, medicaments and biological substances status: Secondary | ICD-10-CM | POA: Insufficient documentation

## 2019-12-05 DIAGNOSIS — Z955 Presence of coronary angioplasty implant and graft: Secondary | ICD-10-CM | POA: Insufficient documentation

## 2019-12-05 DIAGNOSIS — Z9842 Cataract extraction status, left eye: Secondary | ICD-10-CM | POA: Insufficient documentation

## 2019-12-05 DIAGNOSIS — M1990 Unspecified osteoarthritis, unspecified site: Secondary | ICD-10-CM | POA: Diagnosis not present

## 2019-12-05 DIAGNOSIS — I110 Hypertensive heart disease with heart failure: Secondary | ICD-10-CM | POA: Diagnosis not present

## 2019-12-05 DIAGNOSIS — N400 Enlarged prostate without lower urinary tract symptoms: Secondary | ICD-10-CM | POA: Insufficient documentation

## 2019-12-05 DIAGNOSIS — D649 Anemia, unspecified: Secondary | ICD-10-CM | POA: Diagnosis not present

## 2019-12-05 DIAGNOSIS — Z951 Presence of aortocoronary bypass graft: Secondary | ICD-10-CM | POA: Diagnosis not present

## 2019-12-05 DIAGNOSIS — Z87891 Personal history of nicotine dependence: Secondary | ICD-10-CM | POA: Insufficient documentation

## 2019-12-05 DIAGNOSIS — Z7901 Long term (current) use of anticoagulants: Secondary | ICD-10-CM | POA: Diagnosis not present

## 2019-12-05 DIAGNOSIS — M19049 Primary osteoarthritis, unspecified hand: Secondary | ICD-10-CM | POA: Insufficient documentation

## 2019-12-05 DIAGNOSIS — Z79899 Other long term (current) drug therapy: Secondary | ICD-10-CM | POA: Insufficient documentation

## 2019-12-05 DIAGNOSIS — I4819 Other persistent atrial fibrillation: Secondary | ICD-10-CM

## 2019-12-05 DIAGNOSIS — Z85820 Personal history of malignant melanoma of skin: Secondary | ICD-10-CM | POA: Diagnosis not present

## 2019-12-05 DIAGNOSIS — Z961 Presence of intraocular lens: Secondary | ICD-10-CM | POA: Insufficient documentation

## 2019-12-05 DIAGNOSIS — Z7984 Long term (current) use of oral hypoglycemic drugs: Secondary | ICD-10-CM | POA: Insufficient documentation

## 2019-12-05 DIAGNOSIS — E1151 Type 2 diabetes mellitus with diabetic peripheral angiopathy without gangrene: Secondary | ICD-10-CM | POA: Diagnosis not present

## 2019-12-05 DIAGNOSIS — I5032 Chronic diastolic (congestive) heart failure: Secondary | ICD-10-CM | POA: Insufficient documentation

## 2019-12-05 HISTORY — PX: CARDIOVERSION: SHX1299

## 2019-12-05 LAB — GLUCOSE, CAPILLARY
Glucose-Capillary: 117 mg/dL — ABNORMAL HIGH (ref 70–99)
Glucose-Capillary: 90 mg/dL (ref 70–99)

## 2019-12-05 LAB — PROTIME-INR
INR: 1.9 — ABNORMAL HIGH (ref 0.8–1.2)
Prothrombin Time: 21.4 seconds — ABNORMAL HIGH (ref 11.4–15.2)

## 2019-12-05 SURGERY — CARDIOVERSION
Anesthesia: General

## 2019-12-05 MED ORDER — LACTATED RINGERS IV SOLN: INTRAVENOUS | Status: DC

## 2019-12-05 MED ORDER — AMIODARONE HCL 200 MG PO TABS: 200.0000 mg | ORAL_TABLET | Freq: Every day | ORAL | 0 refills | Status: DC

## 2019-12-05 MED ORDER — PROPOFOL 10 MG/ML IV BOLUS
INTRAVENOUS | Status: DC | PRN
  Administered 2019-12-05: 30 mg via INTRAVENOUS
  Administered 2019-12-05: 10 mg via INTRAVENOUS

## 2019-12-05 MED ORDER — CARVEDILOL 3.125 MG PO TABS
3.1250 mg | ORAL_TABLET | Freq: Two times a day (BID) | ORAL | 1 refills | Status: DC
Start: 2019-12-05 — End: 2019-12-30

## 2019-12-05 NOTE — Interval H&P Note (Signed)
History and Physical Interval Note:  12/05/2019 10:01 AM  Chart reviewed. Patient presents for elective cardioversion of persistent atrial fibrillation, procedure scheduled by Dr. Lovena Le. He is on Eliquis (no missed doses), Amiodarone, and Coreg. Recent lab work reviewed. He is ready to proceed. Consent obtained.  [name], M.D., F.A.C.C.

## 2019-12-05 NOTE — Transfer of Care (Signed)
Immediate Anesthesia Transfer of Care Note  Patient: SERAJ DUNNAM  Procedure(s) Performed: CARDIOVERSION (N/A )  Patient Location: PACU  Anesthesia Type:MAC  Level of Consciousness: awake, alert , oriented and patient cooperative  Airway & Oxygen Therapy: Patient Spontanous Breathing  Post-op Assessment: Report given to RN, Post -op Vital signs reviewed and stable and Patient moving all extremities  Post vital signs: Reviewed and stable  Last Vitals:  Vitals Value Taken Time  BP    Temp    Pulse    Resp    SpO2      Last Pain:  Vitals:   12/05/19 0851  TempSrc: Oral  PainSc: 0-No pain         Complications: No complications documented.

## 2019-12-05 NOTE — Anesthesia Preprocedure Evaluation (Signed)
Anesthesia Evaluation  Patient identified by MRN, date of birth, ID band Patient awake    Reviewed: Allergy & Precautions, NPO status , reviewed documented beta blocker date and time , Unable to perform ROS - Chart review only  History of Anesthesia Complications Negative for: history of anesthetic complications  Airway Mallampati: II  TM Distance: >3 FB Neck ROM: Full    Dental  (+) Poor Dentition, Dental Advisory Given   Pulmonary pneumonia, resolved, former smoker,  07/06/2019 SARS coronavirus NEG   breath sounds clear to auscultation       Cardiovascular hypertension, Pt. on medications and Pt. on home beta blockers (-) angina+ CAD, + Past MI, + Cardiac Stents, + CABG and + Peripheral Vascular Disease (s/p fem-fem)   Rhythm:Irregular Rate:Normal  '18 ECHO: EF 40-45%, Akinesis of the inferolateral wall with overall mild to moderate LV dysfunction; mild MR; moderate LAE; mild TR; moderately elevated pulmonary pressure.    Neuro/Psych PSYCHIATRIC DISORDERS Anxiety  Neuromuscular disease    GI/Hepatic negative GI ROS, Neg liver ROS,   Endo/Other  diabetes, Oral Hypoglycemic Agents  Renal/GU negative Renal ROS     Musculoskeletal  (+) Arthritis ,   Abdominal   Peds  Hematology  (+) Blood dyscrasia, anemia , eliquis   Anesthesia Other Findings   Reproductive/Obstetrics                             Anesthesia Physical  Anesthesia Plan  ASA: III  Anesthesia Plan: General   Post-op Pain Management:    Induction: Intravenous  PONV Risk Score and Plan: 2 and Treatment may vary due to age or medical condition  Airway Management Planned: Natural Airway and Mask  Additional Equipment:   Intra-op Plan:   Post-operative Plan:   Informed Consent: I have reviewed the patients History and Physical, chart, labs and discussed the procedure including the risks, benefits and alternatives for the  proposed anesthesia with the patient or authorized representative who has indicated his/her understanding and acceptance.     Dental advisory given  Plan Discussed with: CRNA and Surgeon  Anesthesia Plan Comments:         Anesthesia Quick Evaluation

## 2019-12-05 NOTE — CV Procedure (Signed)
Electrical Cardioversion Procedure Note [name] [id number] 03/17/1941  Procedure: Electrical Cardioversion Indications: A Fib  Procedure Details Consent:Verified Time Out: Verified patient identification, verified procedure, site/side was marked, verified correct patient position, special equipment/implants available, medications/allergies/relevent history reviewed, required imaging and test results available.  1019  Patient placed on cardiac monitor, pulse oximetry, supplemental oxygen as necessary.  Sedation given: per anesthesia Pacer pads placed 1018  Cardioverted 1 time(s).  Cardioverted at 120 J  Evaluation Findings: Post procedure EKG shows:  SB with PAC's @ 2241 Complications: 0 Patient did } tolerate procedure well.   [name] [name] 12/05/2019, 10:42 AM

## 2019-12-05 NOTE — Anesthesia Postprocedure Evaluation (Signed)
Anesthesia Post Note  Patient: [name]  Procedure(s) Performed: CARDIOVERSION (N/A )  Patient location during evaluation: PACU Anesthesia Type: MAC Level of consciousness: awake, oriented, awake and alert and patient cooperative Pain management: pain level controlled Vital Signs Assessment: post-procedure vital signs reviewed and stable Respiratory status: spontaneous breathing, respiratory function stable and nonlabored ventilation Cardiovascular status: blood pressure returned to baseline Postop Assessment: no headache and no backache Anesthetic complications: no   No complications documented.   Last Vitals:  Vitals:   12/05/19 0851 12/05/19 0909  BP: (!) 140/56 140/63  Pulse: (!) 56   Resp: 20   Temp: 36.7 C   SpO2: 100%     Last Pain:  Vitals:   12/05/19 0851  TempSrc: Oral  PainSc: 0-No pain                 Tacy Learn

## 2019-12-05 NOTE — Discharge Instructions (Signed)
Atrial Fibrillation  Atrial fibrillation is a type of heartbeat that is irregular or fast. If you have this condition, your heart beats without any order. This makes it hard for your heart to pump blood in a normal way. Atrial fibrillation may come and go, or it may become a long-lasting problem. If this condition is not treated, it can put you at higher risk for stroke, heart failure, and other heart problems. What are the causes? This condition may be caused by diseases that damage the heart. They include:  High blood pressure.  Heart failure.  Heart valve disease.  Heart surgery. Other causes include:  Diabetes.  Thyroid disease.  Being overweight.  Kidney disease. Sometimes the cause is not known. What increases the risk? You are more likely to develop this condition if:  You are older.  You smoke.  You exercise often and very hard.  You have a family history of this condition.  You are a man.  You use drugs.  You drink a lot of alcohol.  You have lung conditions, such as emphysema, pneumonia, or COPD.  You have sleep apnea. What are the signs or symptoms? Common symptoms of this condition include:  A feeling that your heart is beating very fast.  Chest pain or discomfort.  Feeling short of breath.  Suddenly feeling light-headed or weak.  Getting tired easily during activity.  Fainting.  Sweating. In some cases, there are no symptoms. How is this treated? Treatment for this condition depends on underlying conditions and how you feel when you have atrial fibrillation. They include:  Medicines to: ? Prevent blood clots. ? Treat heart rate or heart rhythm problems.  Using devices, such as a pacemaker, to correct heart rhythm problems.  Doing surgery to remove the part of the heart that sends bad signals.  Closing an area where clots can form in the heart (left atrial appendage). In some cases, your doctor will treat other underlying  conditions. Follow these instructions at home: Medicines  Take over-the-counter and prescription medicines only as told by your doctor.  Do not take any new medicines without first talking to your doctor.  If you are taking blood thinners: ? Talk with your doctor before you take any medicines that have aspirin or NSAIDs, such as ibuprofen, in them. ? Take your medicine exactly as told by your doctor. Take it at the same time each day. ? Avoid activities that could hurt or bruise you. Follow instructions about how to prevent falls. ? Wear a bracelet that says you are taking blood thinners. Or, carry a card that lists what medicines you take. Lifestyle      Do not use any products that have nicotine or tobacco in them. These include cigarettes, e-cigarettes, and chewing tobacco. If you need help quitting, ask your doctor.  Eat heart-healthy foods. Talk with your doctor about the right eating plan for you.  Exercise regularly as told by your doctor.  Do not drink alcohol.  Lose weight if you are overweight.  Do not use drugs, including cannabis. General instructions  If you have a condition that causes breathing to stop for a short period of time (apnea), treat it as told by your doctor.  Keep a healthy weight. Do not use diet pills unless your doctor says they are safe for you. Diet pills may make heart problems worse.  Keep all follow-up visits as told by your doctor. This is important. Contact a doctor if:  You notice a change   in the speed, rhythm, or strength of your heartbeat.  You are taking a blood-thinning medicine and you get more bruising.  You get tired more easily when you move or exercise.  You have a sudden change in weight. Get help right away if:   You have pain in your chest or your belly (abdomen).  You have trouble breathing.  You have side effects of blood thinners, such as blood in your vomit, poop (stool), or pee (urine), or bleeding that cannot  stop.  You have any signs of a stroke. "BE FAST" is an easy way to remember the main warning signs: ? B - Balance. Signs are dizziness, sudden trouble walking, or loss of balance. ? E - Eyes. Signs are trouble seeing or a change in how you see. ? F - Face. Signs are sudden weakness or loss of feeling in the face, or the face or eyelid drooping on one side. ? A - Arms. Signs are weakness or loss of feeling in an arm. This happens suddenly and usually on one side of the body. ? S - Speech. Signs are sudden trouble speaking, slurred speech, or trouble understanding what people say. ? T - Time. Time to call emergency services. Write down what time symptoms started.  You have other signs of a stroke, such as: ? A sudden, very bad headache with no known cause. ? Feeling like you may vomit (nausea). ? Vomiting. ? A seizure. These symptoms may be an emergency. Do not wait to see if the symptoms will go away. Get medical help right away. Call your local emergency services (911 in the [place] Do not drive yourself to the hospital. Summary  Atrial fibrillation is a type of heartbeat that is irregular or fast.  You are at higher risk of this condition if you smoke, are older, have diabetes, or are overweight.  Follow your doctor's instructions about medicines, diet, exercise, and follow-up visits.  Get help right away if you have signs or symptoms of a stroke.  Get help right away if you cannot catch your breath, or you have chest pain or discomfort. This information is not intended to replace advice given to you by your health care provider. Make sure you discuss any questions you have with your health care provider. Document Revised: 09/05/2018 Document Reviewed: 09/05/2018 Elsevier Patient Education  2020 [place] After These instructions provide you with information about caring for yourself after your procedure. Your health care provider may also give you  more specific instructions. Your treatment has been planned according to current medical practices, but problems sometimes occur. Call your health care provider if you have any problems or questions after your procedure. What can I expect after the procedure? After your procedure, you may:  Feel sleepy for several hours.  Feel clumsy and have poor balance for several hours.  Feel forgetful about what happened after the procedure.  Have poor judgment for several hours.  Feel nauseous or vomit.  Have a sore throat if you had a breathing tube during the procedure. Follow these instructions at home: For at least 24 hours after the procedure:      Have a responsible adult stay with you. It is important to have someone help care for you until you are awake and alert.  Rest as needed.  Do not: ? Participate in activities in which you could fall or become injured. ? Drive. ? Use heavy machinery. ? Drink alcohol. ? Take sleeping  pills or medicines that cause drowsiness. ? Make important decisions or sign legal documents. ? Take care of children on your own. Eating and drinking  Follow the diet that is recommended by your health care provider.  If you vomit, drink water, juice, or soup when you can drink without vomiting.  Make sure you have little or no nausea before eating solid foods. General instructions  Take over-the-counter and prescription medicines only as told by your health care provider.  If you have sleep apnea, surgery and certain medicines can increase your risk for breathing problems. Follow instructions from your health care provider about wearing your sleep device: ? Anytime you are sleeping, including during daytime naps. ? While taking prescription pain medicines, sleeping medicines, or medicines that make you drowsy.  If you smoke, do not smoke without supervision.  Keep all follow-up visits as told by your health care provider. This is important. Contact  a health care provider if:  You keep feeling nauseous or you keep vomiting.  You feel light-headed.  You develop a rash.  You have a fever. Get help right away if:  You have trouble breathing. Summary  For several hours after your procedure, you may feel sleepy and have poor judgment.  Have a responsible adult stay with you for at least 24 hours or until you are awake and alert. This information is not intended to replace advice given to you by your health care provider. Make sure you discuss any questions you have with your health care provider. Document Revised: 06/12/2017 Document Reviewed: 07/05/2015 Elsevier Patient Education  [place].

## 2019-12-05 NOTE — CV Procedure (Signed)
Elective direct-current cardioversion  Indication: Persistent atrial fibrillation  Description of procedure: After informed consent was obtained the patient was taken to the procedure suite where a timeout was performed.  Anterior and posterior pads were placed in standard fashion and connected to a biphasic defibrillator.  Moderate to deep sedation was achieved via use of propofol per the anesthesia service, please refer to the records for details of medication administration.  Once adequate sedation was achieved a sandbag was placed on the anterior chest pad and a single synchronized shock was delivered at 120 J with successful restoration of sinus bradycardia with PACs, confirmed by ECG.  He remained hemodynamically stable throughout and there were no immediate complications.  Disposition: After post-sedation observation patient will be discharged home in the care of his wife.  Given bradycardia with heart rate in the low 50s in sinus rhythm, amiodarone will be reduced to 200 mg once daily and Coreg reduced to 3.125 mg twice daily.  Otherwise continue baseline regimen.  Follow-up will be arranged in the [place] office in the next 3 to 4 weeks.  Satira Sark, M.D., F.A.C.C.

## 2019-12-09 ENCOUNTER — Encounter (HOSPITAL_COMMUNITY): Payer: Self-pay | Admitting: Cardiology

## 2019-12-12 ENCOUNTER — Other Ambulatory Visit: Payer: Self-pay

## 2019-12-12 ENCOUNTER — Other Ambulatory Visit: Payer: Self-pay | Admitting: Internal Medicine

## 2019-12-12 MED ORDER — AMIODARONE HCL 200 MG PO TABS: 200.0000 mg | ORAL_TABLET | Freq: Every day | ORAL | 1 refills | Status: DC

## 2019-12-12 NOTE — Telephone Encounter (Signed)
Refill requested form pharmacy for Amiodarone 200 mg bid but rx was decreased to 200 mg daily on 12/05/19 after cardioversion by Dr.McDowell.

## 2019-12-12 NOTE — Telephone Encounter (Signed)
This is a [place] pt.  °

## 2019-12-16 ENCOUNTER — Telehealth: Payer: Self-pay | Admitting: Internal Medicine

## 2019-12-16 NOTE — Telephone Encounter (Signed)
Pt c/o medication issue:  1. Name of Medication: amiodarone (PACERONE) 200 MG tablet  2. How are you currently taking this medication (dosage and times per day)? As directed  3. Are you having a reaction (difficulty breathing--STAT)? no  4. What is your medication issue? Pt said his cardioversion only lasted 8 days and he is back in afib. He wanted to know if he still needs to take this  Medication if he is in afib.. If patient is not homw please leave an answer on his answering machine

## 2019-12-17 NOTE — Telephone Encounter (Signed)
Patient notified

## 2019-12-17 NOTE — Telephone Encounter (Signed)
Per Dr. Jarold Motto may stop amiodarone.  Forwarding to [place] office.

## 2019-12-19 ENCOUNTER — Other Ambulatory Visit: Payer: Self-pay

## 2019-12-19 DIAGNOSIS — I739 Peripheral vascular disease, unspecified: Secondary | ICD-10-CM

## 2019-12-28 ENCOUNTER — Other Ambulatory Visit: Payer: Self-pay | Admitting: Cardiology

## 2019-12-31 ENCOUNTER — Ambulatory Visit: Payer: Medicare Other | Admitting: Physician Assistant

## 2019-12-31 ENCOUNTER — Encounter (HOSPITAL_COMMUNITY): Payer: Medicare Other

## 2019-12-31 ENCOUNTER — Other Ambulatory Visit: Payer: Self-pay

## 2019-12-31 ENCOUNTER — Ambulatory Visit (HOSPITAL_COMMUNITY)
Admission: RE | Admit: 2019-12-31 | Discharge: 2019-12-31 | Disposition: A | Discharge status: Home / Self Care | Payer: Medicare Other | Source: Ambulatory Visit | Attending: Vascular Surgery | Admitting: Vascular Surgery

## 2019-12-31 VITALS — BP 140/62 | HR 53 | Temp 98.3°F | Resp 20 | Ht 69.0 in | Wt 169.7 lb

## 2019-12-31 DIAGNOSIS — I739 Peripheral vascular disease, unspecified: Secondary | ICD-10-CM | POA: Diagnosis present

## 2019-12-31 NOTE — Progress Notes (Signed)
HISTORY AND PHYSICAL     CC:  follow up. Requesting Provider:  Lawerance Cruel, MD  HPI: This is a 79 y.o. male who is here today for follow up for PAD.  He has hx of bilateral CFA endarterectomy with right to left femoral to femoral bypass on 12/31/2014 by Dr. Oneida Alar for claudication.  He is also followed for bilateral carotid artery stenosis.    Pt was last seen April 2021 and at that time, he was not having any claudication sx, rest pain or non healing wounds.  He did have chronic lower extremity edema.   At that visit, he had recently developed afib.  Last month, he underwent cardioversion.  A little over a week later, pt converted back to afib.   The pt states he recently saw his PCP in [place].  His left 2nd toe was red that day and his PCP was concerned and wanted him to f/u with [place].  Pt states that his 2nd toe will occasionally turn red and then return to normal.  He states he hasn't had any non healing wounds.  He doesn't have any claudication or rest pain.  He states that he does walk sometimes in 15 or 30 minute increments around the house.  He doesn't feel like he walks enough though.    He states that he is back in afib.  He remains on his Eliquis.  He states that when they restarted his amiodarone, they had taken him off his statin.  He is now off the amiodarone and has an appt with the cardiology PA in a couple of days and Dr. Lovena Le later this month.    The pt is currently not on a statin for cholesterol management.   Dr. Lovena Le following.  The pt is not on an aspirin.    Other AC:  Eliquis The pt is on BB, ARB for hypertension.  The pt does have diabetes. Tobacco hx:  former    Past Medical History:  Diagnosis Date  . Acute inferior myocardial infarction (HCC)    hx  . Acute on chronic diastolic CHF (congestive heart failure), NYHA class 1 ([place]) 08/09/2014  . Anemia   . BPH (benign prostatic hyperplasia)   . CAD (coronary artery disease)   . Dyslipidemia   .  History of blood transfusion    "I've had one; don't remember when"  . HTN (hypertension)   . Hyponatremia   . 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 Myocardial infarction [facility]) x2  1986, 2005  . 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 PAD (peripheral artery disease) ([place])   . Type II diabetes mellitus ([place])     Past Surgical History:  Procedure Laterality Date  . CARDIAC CATHETERIZATION  12/23/2014   Procedure: Left Heart Cath and Cors/Grafts Angiography;  Surgeon: Peter M Martinique, MD;  Location: [place] [facility];  Service: Cardiovascular;;  . CARDIOVERSION N/A 05/05/2016   Procedure: CARDIOVERSION;  Surgeon: Lelon Perla, MD;  Location: [facility] ENDOSCOPY;  Service: Cardiovascular;  Laterality: N/A;  . CARDIOVERSION N/A 07/09/2019   Procedure: CARDIOVERSION;  Surgeon: Josue Hector, MD;  Location: [facility] ENDOSCOPY;  Service: Cardiovascular;  Laterality: N/A;  . CARDIOVERSION N/A 12/05/2019   Procedure: CARDIOVERSION;  Surgeon: Satira Sark, MD;  Location: [facility] ENDO SUITE;  Service: Cardiovascular;  Laterality: N/A;  . CATARACT EXTRACTION W/ INTRAOCULAR LENS  IMPLANT, BILATERAL Bilateral   . CORONARY ANGIOPLASTY WITH STENT PLACEMENT    .  CORONARY ARTERY BYPASS GRAFT  1986; redo 2005   ; free Rima to OM, svg-diag,svg-pda  . ENDARTERECTOMY FEMORAL Bilateral 12/31/2014   Procedure: BILATERAL FEMORAL ENDARTERECTOMY;  Surgeon: Elam Dutch, MD;  Location: [place];  Service: Vascular;  Laterality: Bilateral;  . FEMORAL-FEMORAL BYPASS GRAFT Bilateral 12/31/2014   Procedure: FEMORAL-FEMORAL ARTERY BYPASS GRAFT;  Surgeon: Elam Dutch, MD;  Location: [place];  Service: Vascular;  Laterality: Bilateral;  . KYPHOPLASTY  02/29/2012   Procedure: KYPHOPLASTY;  Surgeon: Sinclair Ship, MD;  Location: [place];  Service: Orthopedics;  Laterality: Bilateral;  T 10 kyphoplasty  . LAPAROTOMY N/A 08/02/2014   Procedure: EXPLORATORY LAPAROTOMY LYSIS OF ADHESIONS;  Surgeon: Donnie Mesa,  MD;  Location: [place];  Service: General;  Laterality: N/A;  . MELANOMA EXCISION     scalp  . PERIPHERAL VASCULAR CATHETERIZATION N/A 12/05/2014   Procedure: Abdominal Aortogram;  Surgeon: Elam Dutch, MD;  Location: [place] [facility];  Service: Cardiovascular;  Laterality: N/A;  . ROTATOR CUFF REPAIR Right   . TONSILLECTOMY      Allergies  Allergen Reactions  . Hctz [Hydrochlorothiazide] Other (See Comments)    Hypokalemia     Current Outpatient Medications  Medication Sig Dispense Refill  . ALPRAZolam (XANAX) 1 MG tablet Take 1 mg by mouth daily as needed for anxiety.    Marland Kitchen apixaban (ELIQUIS) 5 MG TABS tablet Take 1 tablet (5 mg total) by mouth 2 (two) times daily. 60 tablet 0  . carvedilol (COREG) 3.125 MG tablet TAKE 1 TABLET (3.125 MG TOTAL) BY MOUTH 2 (TWO) TIMES DAILY WITH A MEAL. 60 tablet 3  . furosemide (LASIX) 20 MG tablet TAKE 2 TABLETS (40 MG TOTAL) BY MOUTH DAILY. 40 MG MON, WED & FRI 20MG  ON ALL OTHER DAYS 360 tablet 1  . glimepiride (AMARYL) 4 MG tablet Take 4 mg by mouth daily at 2 PM.     . iron polysaccharides (NIFEREX) 150 MG capsule Take 150 mg by mouth at bedtime.     Marland Kitchen losartan (COZAAR) 50 MG tablet Take 50 mg by mouth daily.    . nitroGLYCERIN (NITROSTAT) 0.4 MG SL tablet PLACE 1 TABLET (0.4 MG TOTAL) UNDER THE TONGUE EVERY 5 (FIVE) MINUTES AS NEEDED FOR CHEST PAIN. 25 tablet 2  . pioglitazone (ACTOS) 45 MG tablet Take 45 mg by mouth daily.     . potassium chloride (KLOR-CON) 10 MEQ tablet Take 1 tablet (10 mEq total) by mouth daily. 90 tablet 3   No current facility-administered medications for this visit.    Family History  Problem Relation Age of Onset  . Dementia Mother     Social History   Socioeconomic History  . Marital status: Married    Spouse name: Not on file  . Number of children: 2  . Years of education: Not on file  . Highest education level: Not on file  Occupational History  . Occupation: Optician, dispensing: RETIRED  Tobacco  Use  . Smoking status: Former Smoker    Packs/day: 2.00    Years: 25.00    Pack years: 50.00    Types: Cigarettes    Quit date: 02/22/1986    Years since quitting: 33.8  . Smokeless tobacco: Never Used  Vaping Use  . Vaping Use: Never used  Substance and Sexual Activity  . Alcohol use: Yes    Alcohol/week: 6.0 standard drinks    Types: 6 Cans of beer per week  . Drug use: No  .  Sexual activity: Not Currently  Other Topics Concern  . Not on file  Social History Narrative  . Not on file   Social Determinants of Health   Financial Resource Strain:   . Difficulty of Paying Living Expenses: Not on file  Food Insecurity:   . Worried About Charity fundraiser in the Last Year: Not on file  . Ran Out of Food in the Last Year: Not on file  Transportation Needs:   . Lack of Transportation (Medical): Not on file  . Lack of Transportation (Non-Medical): Not on file  Physical Activity:   . Days of Exercise per Week: Not on file  . Minutes of Exercise per Session: Not on file  Stress:   . Feeling of Stress : Not on file  Social Connections:   . Frequency of Communication with Friends and Family: Not on file  . Frequency of Social Gatherings with Friends and Family: Not on file  . Attends Religious Services: Not on file  . Active Member of Clubs or Organizations: Not on file  . Attends Archivist Meetings: Not on file  . Marital Status: Not on file  Intimate Partner Violence:   . Fear of Current or Ex-Partner: Not on file  . Emotionally Abused: Not on file  . Physically Abused: Not on file  . Sexually Abused: Not on file     REVIEW OF SYSTEMS:   [X]  denotes positive finding, [ ]  denotes negative finding Cardiac  Comments:  Chest pain or chest pressure:    Shortness of breath upon exertion:    Short of breath when lying flat:    Irregular heart rhythm:        Vascular    Pain in calf, thigh, or hip brought on by ambulation:    Pain in feet at night that wakes  you up from your sleep:     Blood clot in your veins:    Leg swelling:         Pulmonary    Oxygen at home:    Productive cough:     Wheezing:         Neurologic    Sudden weakness in arms or legs:     Sudden numbness in arms or legs:     Sudden onset of difficulty speaking or slurred speech:    Temporary loss of vision in one eye:     Problems with dizziness:         Gastrointestinal    Blood in stool:     Vomited blood:         Genitourinary    Burning when urinating:     Blood in urine:        Psychiatric    Major depression:         Hematologic    Bleeding problems:    Problems with blood clotting too easily:        Skin    Rashes or ulcers:        Constitutional    Fever or chills:      PHYSICAL EXAMINATION:  Today's Vitals   12/31/19 1455  BP: 140/62  Pulse: (!) 53  Resp: 20  Temp: 98.3 F (36.8 C)  TempSrc: Temporal  SpO2: 98%  Weight: 169 lb 11.2 oz (77 kg)  Height: 5\' 9"  (1.753 m)  PainSc: 2    Body mass index is 25.06 kg/m.   General:  WDWN in NAD; vital signs documented above Gait: Not  observed HENT: WNL, normocephalic Pulmonary: normal non-labored breathing , without wheezing Cardiac: irregular HR, without  Murmur; without carotid bruits Abdomen: soft, NT, no masses; aortic pulse is not palpable Skin: without rashes Vascular Exam/Pulses:  Right Left  Radial 2+ (normal) 2+ (normal)  Ulnar 1+ (weak) 1+ (weak)  Femoral 2+ (normal) 2+ (normal)  Popliteal Unable to palpate Unable to palpate  DP Brisk biphasic doppler flow Brisk biphasic doppler flow  PT Brisk biphasic doppler flow Brisk biphasic doppler flow   Femoral to femoral bypass is patent with easily palpable pulse.   Extremities: without ischemic changes, without Gangrene , without cellulitis; without open wounds; very small nodule area on the right 2nd toe  Musculoskeletal: no muscle wasting or atrophy  Neurologic: A&O X 3;  No focal weakness or paresthesias are  detected Psychiatric:  The pt has Normal affect.   Non-Invasive Vascular Imaging:   ABI's/TBI's on 12/31/2019: +-------+----------------+-----------+----------------+------------+  ABI/TBIToday's ABI   Today's TBIPrevious ABI  Previous TBI  +-------+----------------+-----------+----------------+------------+  Right 1.03      0.44    Non compressiblenot obtained  +-------+----------------+-----------+----------------+------------+  Left  Non compressible0.34    Non compressiblenot obtained  +-------+----------------+-----------+----------------+------------+    Arterial wall calcification precludes accurate ankle pressures and ABIs.  Left ABIs appear essentially unchanged compared to prior study on  04/08/2021s.   Previous ABI's/TBI's on 07/04/2019: Right:  [place] Left:  [place]    ASSESSMENT/PLAN:: 79 y.o. male with hx of hx of bilateral CFA endarterectomy with right to left femoral to femoral bypass on 12/31/2014 by Dr. Oneida Alar who presents today for follow up for redness that he recently had on the left 2nd toe  -pt had ABI's today with non-compressible vessels.  On exam, he has brisk biphasic/triphasic flow.  He has had some redness of the left 2nd toes that is intermittent that has been present for some time now, but no non healing wounds.  He does have a very small nodule on the right 2nd toe but no evidence of infection or non healing and it is non painful.  I have asked him to keep this toe protected.  If this worsens or does not improve, he will call us back sooner.  Otherwise, he will f/u in 6 months.  At that time, we will get ABI and carotid duplex, which was scheduled.  I have added a duplex of him fem fem bypass since he hasn't had a duplex in a while.   -pt not on a statin as it was discontinued by cardiology when he was started on amiodarone.  Will defer to them when to restart. -pt will call us sooner if he has any issues before his next  visit   Leontine Locket, [facility] [phone]  Clinic MD:   Carlis Abbott

## 2020-01-02 ENCOUNTER — Other Ambulatory Visit: Payer: Self-pay

## 2020-01-02 ENCOUNTER — Ambulatory Visit (INDEPENDENT_AMBULATORY_CARE_PROVIDER_SITE_OTHER): Payer: Medicare Other | Admitting: Student

## 2020-01-02 ENCOUNTER — Encounter: Payer: Self-pay | Admitting: Student

## 2020-01-02 ENCOUNTER — Other Ambulatory Visit: Payer: Self-pay | Admitting: *Deleted

## 2020-01-02 VITALS — HR 62 | Ht 69.0 in | Wt 169.0 lb

## 2020-01-02 DIAGNOSIS — I5042 Chronic combined systolic (congestive) and diastolic (congestive) heart failure: Secondary | ICD-10-CM | POA: Diagnosis not present

## 2020-01-02 DIAGNOSIS — I739 Peripheral vascular disease, unspecified: Secondary | ICD-10-CM

## 2020-01-02 DIAGNOSIS — I25810 Atherosclerosis of coronary artery bypass graft(s) without angina pectoris: Secondary | ICD-10-CM

## 2020-01-02 DIAGNOSIS — I4819 Other persistent atrial fibrillation: Secondary | ICD-10-CM | POA: Diagnosis not present

## 2020-01-02 DIAGNOSIS — I6523 Occlusion and stenosis of bilateral carotid arteries: Secondary | ICD-10-CM

## 2020-01-02 DIAGNOSIS — E785 Hyperlipidemia, unspecified: Secondary | ICD-10-CM

## 2020-01-02 MED ORDER — AMIODARONE HCL 200 MG PO TABS: 200.0000 mg | ORAL_TABLET | Freq: Every day | ORAL | 3 refills | Status: DC

## 2020-01-02 MED ORDER — ROSUVASTATIN CALCIUM 10 MG PO TABS: 10.0000 mg | ORAL_TABLET | Freq: Every day | ORAL | 3 refills | Status: AC

## 2020-01-02 MED ORDER — POTASSIUM CHLORIDE ER 10 MEQ PO TBCR: 10.0000 meq | EXTENDED_RELEASE_TABLET | Freq: Every day | ORAL | 3 refills | Status: DC

## 2020-01-02 NOTE — Progress Notes (Signed)
Cardiology Office Note    Date:  01/02/2020   ID:  [name], DOB 1941/02/22, MRN [id number]  PCP:  Lawerance Cruel, MD  Cardiologist: Peter Martinique, MD   EP: Dr. Lovena Le  Chief Complaint  Patient presents with  . Follow-up    s/p DCCV    History of Present Illness:    [name] is a 79 y.o. male with past medical history of CAD (s/p CABG in 2005 with RIMA-OM, SVG-D1 and SVG-PDA, cath in 2016 showing patent grafts), persistent atrial fibrillation, chronic combined systolic and diastolic CHF (EF 40-08% by echo in 2018), PAD (s/p bilateral femoral endarterectomy and fem-fem bypass in 2016), HTN and HLD who presents to the office today for follow-up from his recent cardioversion.  He was examined by Dr. Lovena Le in our [place] office in 10/2019 for persistent atrial fibrillation and was not felt to be an ablation candidate. Options were reviewed in regards to Amiodarone versus Tikosyn and he preferred Amiodarone at the time. He was continued on Amiodarone 200 mg twice daily with plans for DCCV in 11/2019.Simvastatin was discontinued given the interaction with Amiodarone. He underwent cardioversion by Dr. Domenic Polite 12/05/2019 with restoration of normal sinus rhythm following a single synchronized shock at 120 J. He was bradycardic with heart rate in the 50's following the procedure, therefore Amiodarone was reduced to 200 mg once daily and Coreg 3.125 mg twice daily with follow-up arranged. He called the office on 12/16/2019 reporting being back in atrial fibrillation, therefore was recommended by Dr. Lovena Le to discontinue Amiodarone.  In talking with the patient today, he reports he felt like he was back in atrial fibrillation because his heart monitor was reading "irregular". He said his heart rate was still well controlled at home and he did not experience any palpitations. He is unsure if his fatigue and overall energy level improved after returning to normal sinus rhythm. He says his  breathing has been at baseline and he denies any recent orthopnea, PND or lower extremity edema.  Reports good compliance with Eliquis and denies any evidence of active bleeding. He does mention always feeling cold since being on a blood thinner.  Past Medical History:  Diagnosis Date  . Acute inferior myocardial infarction (HCC)    hx  . Acute on chronic diastolic CHF (congestive heart failure), NYHA class 1 ([place]) 08/09/2014  . Anemia   . Atrial fibrillation ([place])   . BPH (benign prostatic hyperplasia)   . CAD (coronary artery disease)    a. s/p CABG in 2005 with RIMA-OM, SVG-D1 and SVG-PDA b. cath in 2016 showing patent grafts  . Dyslipidemia   . History of blood transfusion    "I've had one; don't remember when"  . HTN (hypertension)   . Hyponatremia   . 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 Myocardial infarction [facility]) x2  1986, 2005  . 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 PAD (peripheral artery disease) ([place])   . Type II diabetes mellitus ([place])     Past Surgical History:  Procedure Laterality Date  . CARDIAC CATHETERIZATION  12/23/2014   Procedure: Left Heart Cath and Cors/Grafts Angiography;  Surgeon: Peter M Martinique, MD;  Location: [place] CV LAB;  Service: Cardiovascular;;  . CARDIOVERSION N/A 05/05/2016   Procedure: CARDIOVERSION;  Surgeon: Lelon Perla, MD;  Location: [facility] ENDOSCOPY;  Service: Cardiovascular;  Laterality: N/A;  . CARDIOVERSION N/A 07/09/2019   Procedure: CARDIOVERSION;  Surgeon: Jenkins Rouge  C, MD;  Location: [place];  Service: Cardiovascular;  Laterality: N/A;  . CARDIOVERSION N/A 12/05/2019   Procedure: CARDIOVERSION;  Surgeon: Satira Sark, MD;  [facility]O [facility];  Service: Cardiovascular;  Laterality: N/A;  . CATARACT EXTRACTION W/ INTRAOCULAR LENS  IMPLANT, BILATERAL Bilateral   . CORONARY ANGIOPLASTY WITH STENT PLACEMENT    . CORONARY ARTERY BYPASS GRAFT  1986; redo 2005   ; free Rima to OM, svg-diag,svg-pda   . ENDARTERECTOMY FEMORAL Bilateral 12/31/2014   Procedure: BILATERAL FEMORAL ENDARTERECTOMY;  Surgeon: Elam Dutch, MD;  Location: [place];  Service: Vascular;  Laterality: Bilateral;  . FEMORAL-FEMORAL BYPASS GRAFT Bilateral 12/31/2014   Procedure: FEMORAL-FEMORAL ARTERY BYPASS GRAFT;  Surgeon: Elam Dutch, MD;  Location: [place];  Service: Vascular;  Laterality: Bilateral;  . KYPHOPLASTY  02/29/2012   Procedure: KYPHOPLASTY;  Surgeon: Sinclair Ship, MD;  Location: [place];  Service: Orthopedics;  Laterality: Bilateral;  T 10 kyphoplasty  . LAPAROTOMY N/A 08/02/2014   Procedure: EXPLORATORY LAPAROTOMY LYSIS OF ADHESIONS;  Surgeon: Donnie Mesa, MD;  Location: [place];  Service: General;  Laterality: N/A;  . MELANOMA EXCISION     scalp  . PERIPHERAL VASCULAR CATHETERIZATION N/A 12/05/2014   Procedure: Abdominal Aortogram;  Surgeon: Elam Dutch, MD;  Location: [place] CV LAB;  Service: Cardiovascular;  Laterality: N/A;  . ROTATOR CUFF REPAIR Right   . TONSILLECTOMY      Current Medications: Outpatient Medications Prior to Visit  Medication Sig Dispense Refill  . ALPRAZolam (XANAX) 1 MG tablet Take 1 mg by mouth daily as needed for anxiety.    Marland Kitchen apixaban (ELIQUIS) 5 MG TABS tablet Take 1 tablet (5 mg total) by mouth 2 (two) times daily. 60 tablet 0  . carvedilol (COREG) 3.125 MG tablet TAKE 1 TABLET (3.125 MG TOTAL) BY MOUTH 2 (TWO) TIMES DAILY WITH A MEAL. 60 tablet 3  . furosemide (LASIX) 20 MG tablet TAKE 2 TABLETS (40 MG TOTAL) BY MOUTH DAILY. 40 MG MON, WED & FRI 20MG  ON ALL OTHER DAYS 360 tablet 1  . glimepiride (AMARYL) 4 MG tablet Take 4 mg by mouth daily at 2 PM.     . iron polysaccharides (NIFEREX) 150 MG capsule Take 150 mg by mouth at bedtime.     Marland Kitchen losartan (COZAAR) 50 MG tablet Take 50 mg by mouth daily.    . nitroGLYCERIN (NITROSTAT) 0.4 MG SL tablet PLACE 1 TABLET (0.4 MG TOTAL) UNDER THE TONGUE EVERY 5 (FIVE) MINUTES AS NEEDED FOR CHEST PAIN. 25 tablet 2  .  pioglitazone (ACTOS) 45 MG tablet Take 45 mg by mouth daily.      No facility-administered medications prior to visit.     Allergies:   Hctz [hydrochlorothiazide]   Social History   Socioeconomic History  . Marital status: Married    Spouse name: Not on file  . Number of children: 2  . Years of education: Not on file  . Highest education level: Not on file  Occupational History  . Occupation: Optician, dispensing: RETIRED  Tobacco Use  . Smoking status: Former Smoker    Packs/day: 2.00    Years: 25.00    Pack years: 50.00    Types: Cigarettes    Quit date: 02/22/1986    Years since quitting: 33.8  . Smokeless tobacco: Never Used  Vaping Use  . Vaping Use: Never used  Substance and Sexual Activity  . Alcohol use: Yes    Alcohol/week: 6.0 standard  drinks    Types: 6 Cans of beer per week  . Drug use: No  . Sexual activity: Not Currently  Other Topics Concern  . Not on file  Social History Narrative  . Not on file   Social Determinants of Health   Financial Resource Strain:   . Difficulty of Paying Living Expenses: Not on file  Food Insecurity:   . Worried About Charity fundraiser in the Last Year: Not on file  . Ran Out of Food in the Last Year: Not on file  Transportation Needs:   . Lack of Transportation (Medical): Not on file  . Lack of Transportation (Non-Medical): Not on file  Physical Activity:   . Days of Exercise per Week: Not on file  . Minutes of Exercise per Session: Not on file  Stress:   . Feeling of Stress : Not on file  Social Connections:   . Frequency of Communication with Friends and Family: Not on file  . Frequency of Social Gatherings with Friends and Family: Not on file  . Attends Religious Services: Not on file  . Active Member of Clubs or Organizations: Not on file  . Attends Archivist Meetings: Not on file  . Marital Status: Not on file     Family History:  The patient's family history includes Dementia in his mother.     Review of Systems:   Please see the history of present illness.     General:  No chills, fever, night sweats or weight changes. Positive for fatigue.  Cardiovascular:  No chest pain, dyspnea on exertion, edema, orthopnea, palpitations, paroxysmal nocturnal dyspnea. Dermatological: No rash, lesions/masses Respiratory: No cough, dyspnea Urologic: No hematuria, dysuria Abdominal:   No nausea, vomiting, diarrhea, bright red blood per rectum, melena, or hematemesis Neurologic:  No visual changes, wkns, changes in mental status. All other systems reviewed and are otherwise negative except as noted above.   Physical Exam:    VS:  Pulse 62   Ht 5\' 9"  (1.753 m)   Wt 169 lb (76.7 kg)   SpO2 98%   BMI 24.96 kg/m    General: Well developed, well nourished,male appearing in no acute distress. Head: Normocephalic, atraumatic. Neck: No carotid bruits. JVD not elevated.  Lungs: Respirations regular and unlabored, without wheezes or rales.  Heart: Regular rate and rhythm. No S3 or S4.  No murmur, no rubs, or gallops appreciated. Abdomen: Appears non-distended. No obvious abdominal masses. Msk:  Strength and tone appear normal for age. No obvious joint deformities or effusions. Extremities: No clubbing or cyanosis. No lower extremity edema.  Distal pedal pulses are 2+ bilaterally. Neuro: Alert and oriented X 3. Moves all extremities spontaneously. No focal deficits noted. Psych:  Responds to questions appropriately with a normal affect. Skin: No rashes or lesions noted  Wt Readings from Last 3 Encounters:  01/02/20 169 lb (76.7 kg)  12/31/19 169 lb 11.2 oz (77 kg)  12/05/19 160 lb 0.9 oz (72.6 kg)     Studies/Labs Reviewed:   EKG:  EKG is ordered today. The ekg ordered today demonstrates NSR, HR 62 with 1st degree AV Block.   Recent Labs: 12/03/2019: BUN 24; Creatinine, Ser 1.52; Hemoglobin 11.1; Platelets 132; Potassium 4.0; Sodium 136   Lipid Panel    Component Value Date/Time    CHOL 105 04/02/2016 0523   TRIG 54 04/02/2016 0523   HDL 51 04/02/2016 0523   CHOLHDL 2.1 04/02/2016 0523   VLDL 11 04/02/2016 0523  [place] 43 04/02/2016 0523    Additional studies/ records that were reviewed today include:   Cardiac Catheterization: 11/2014  1st Diag lesion, 80% stenosed.  Prox LAD lesion, 100% stenosed.  Ost Cx to Prox Cx lesion, 100% stenosed.  Mid RCA lesion, 100% stenosed.  SVG was injected is normal in caliber, and is anatomically normal.  SVG was injected is normal in caliber, and is anatomically normal.  LIMA was injected is normal in caliber, and is anatomically normal.  1st Mrg lesion, 90% stenosed.  Ost 2nd Diag to 2nd Diag lesion, 100% stenosed.   1. Severe 3 vessel obstructive CAD 2. Patent LIMA to the LAD 3. Patent free RIMA to the first OM. There is a severe stenosis in the proximal OM which limits flow into the distal LCx. 4. Patent SVG to PDA- this fills the entire RCA 5. Mildly elevated EDP.  Prior notes indicate a SVG to a diagonal. This was not seen. There was no aortic marker for this graft. The first diagonal has good flow from the native vessel. There are left to left collaterals from the OM 1 to a second diagonal. Given limitations of radial access I did not spend a lot of time searching for this graft. It appears that his circulation is well accounted for.   Plan: continue medical therapy. He is cleared to proceed with major vascular surgery from a cardiac standpoint and may stop Plavix one week prior.     Echocardiogram: 03/2016 Study Conclusions   - Left ventricle: The cavity size was mildly dilated. Wall  thickness was normal. Systolic function was mildly to moderately  reduced. The estimated ejection fraction was in the range of 40%  to 45%. There is akinesis of the inferolateral myocardium.  - Mitral valve: Calcified annulus. There was mild regurgitation.  - Left atrium: The atrium was moderately dilated.  -  Pulmonary arteries: Systolic pressure was moderately increased.  PA peak pressure: 51 mm Hg (S).   Impressions:   - Akinesis of the inferolateral wall with overall mild to moderate  LV dysfunction; mild MR; moderate LAE; mild TR; moderately  elevated pulmonary pressure.   Assessment:    1. Persistent atrial fibrillation ([place])   2. Coronary artery disease involving coronary bypass graft of native heart without angina pectoris   3. Chronic combined systolic (congestive) and diastolic (congestive) heart failure ([place])   4. PAD (peripheral artery disease) ([place])   5. Hyperlipidemia LDL goal <70      Plan:   In order of problems listed above:  1. Persistent Atrial Fibrillation - He is s/p DCCV in 11/2019 with return to NSR. In NSR by repeat EKG today. I reviewed with him the "irregular" rhythm noted on his BP machine could be secondary to PAC's (as noted on prior EKG's) or PVC's. Reviewed with Dr. Harl Bowie and will restart Amiodarone 200mg  daily as this appears to have worked in helping him maintain NSR. We discussed a monitor to assess for episodes of paroxysmal atrial fibrillation but he wishes to hold off on this for now. Continue Coreg 3.125mg  BID.  - He denies any evidence of active bleeding. Remains on Eliquis 5mg  BID for anticoagulation.   2. CAD - He is s/p CABG in 2005 with RIMA-OM, SVG-D1 and SVG-PDA with cath in 2016 showing patent grafts.  - He does report fatigue but denies any recent chest pain or dyspnea on exertion.  - Continue Coreg 3.125mg  BID. Will restart Crestor in place of Simvastatin. He is not  on ASA given the need for anticoagulation.   3. Chronic Combined Systolic and Diastolic CHF - EF was 24-23% by echo in 2018. He denies any recent orthopnea, PND or edema. If his fatigue does not improve, would recommend a repeat echo for reassessment. Remains on Coreg 3.125mg  BID and Losartan 50mg  daily.   4. PAD - He is s/p bilateral femoral endarterectomy and fem-fem  bypass in 2016. Followed by Vascular Surgery.  5. HLD  - Followed by PCP. He was previously on Simvastatin and this was discontinued at his prior appointment due to the interaction with Amiodarone. Will start Crestor 10mg  daily. He will need repeat FLP and LFT's in 6-8 weeks.    Medication Adjustments/Labs and Tests Ordered: Current medicines are reviewed at length with the patient today.  Concerns regarding medicines are outlined above.  Medication changes, Labs and Tests ordered today are listed in the Patient Instructions below. Patient Instructions  Medication Instructions:  Your physician has recommended you make the following change in your  medication:  Restart Potassium 10 meq Daily  Restart Amiodarone 200 mg Daily  Start Crestor 10 mg Daily   *If you need a refill on your cardiac medications before your next appointment, please call your pharmacy*   Lab Work: NONE   If you have labs (blood work) drawn today and your tests are completely normal, you will receive your results only by: Marland Kitchen MyChart Message (if you have MyChart) OR . A paper copy in the mail If you have any lab test that is abnormal or we need to change your treatment, we will call you to review the results.   Testing/Procedures: NONE    Follow-Up: At [facility], you and your health needs are our priority.  As part of our continuing mission to provide you with exceptional heart care, we have created designated Provider Care Teams.  These Care Teams include your primary Cardiologist (physician) and Advanced Practice Providers (APPs -  Physician Assistants and Nurse Practitioners) who all work together to provide you with the care you need, when you need it.  We recommend signing up for the patient portal called "MyChart".  Sign up information is provided on this After Visit Summary.  MyChart is used to connect with patients for Virtual Visits (Telemedicine).  Patients are able to view lab/test results,  encounter notes, upcoming appointments, etc.  Non-urgent messages can be sent to your provider as well.   To learn more about what you can do with MyChart, go to [email or web address].    Your next appointment:   2 week(s)  The format for your next appointment:   In Person  Provider:   Cristopher Peru, MD   Other Instructions Thank you for choosing [facility]!     Signed, Erma Heritage, PA-C  01/02/2020 8:44 PM    [place] [address] [place], [place] [address] Phone: [phone] Fax: [phone]

## 2020-01-02 NOTE — Patient Instructions (Signed)
Medication Instructions:  Your physician has recommended you make the following change in your  medication:  Restart Potassium 10 meq Daily  Restart Amiodarone 200 mg Daily  Start Crestor 10 mg Daily   *If you need a refill on your cardiac medications before your next appointment, please call your pharmacy*   Lab Work: NONE   If you have labs (blood work) drawn today and your tests are completely normal, you will receive your results only by: Marland Kitchen MyChart Message (if you have MyChart) OR . A paper copy in the mail If you have any lab test that is abnormal or we need to change your treatment, we will call you to review the results.   Testing/Procedures: NONE    Follow-Up: At [facility], you and your health needs are our priority.  As part of our continuing mission to provide you with exceptional heart care, we have created designated Provider Care Teams.  These Care Teams include your primary Cardiologist (physician) and Advanced Practice Providers (APPs -  Physician Assistants and Nurse Practitioners) who all work together to provide you with the care you need, when you need it.  We recommend signing up for the patient portal called "MyChart".  Sign up information is provided on this After Visit Summary.  MyChart is used to connect with patients for Virtual Visits (Telemedicine).  Patients are able to view lab/test results, encounter notes, upcoming appointments, etc.  Non-urgent messages can be sent to your provider as well.   To learn more about what you can do with MyChart, go to [email or web address].    Your next appointment:   2 week(s)  The format for your next appointment:   In Person  Provider:   Cristopher Peru, MD   Other Instructions Thank you for choosing [name]!

## 2020-01-06 ENCOUNTER — Encounter: Payer: Self-pay | Admitting: Cardiology

## 2020-01-15 ENCOUNTER — Ambulatory Visit: Payer: Medicare Other | Admitting: Internal Medicine

## 2020-01-15 ENCOUNTER — Encounter: Payer: Self-pay | Admitting: Internal Medicine

## 2020-01-15 ENCOUNTER — Other Ambulatory Visit: Payer: Self-pay

## 2020-01-15 VITALS — BP 122/70 | HR 62 | Ht 69.0 in | Wt 170.0 lb

## 2020-01-15 DIAGNOSIS — I480 Paroxysmal atrial fibrillation: Secondary | ICD-10-CM | POA: Diagnosis not present

## 2020-01-15 NOTE — Patient Instructions (Signed)
Medication Instructions:  Your physician recommends that you continue on your current medications as directed. Please refer to the Current Medication list given to you today.  Stop Taking Coreg   *If you need a refill on your cardiac medications before your next appointment, please call your pharmacy*   Lab Work: NONE   If you have labs (blood work) drawn today and your tests are completely normal, you will receive your results only by:  [place] (if you have MyChart) OR  A paper copy in the mail If you have any lab test that is abnormal or we need to change your treatment, we will call you to review the results.   Testing/Procedures: NONE    Follow-Up: At [facility], you and your health needs are our priority.  As part of our continuing mission to provide you with exceptional heart care, we have created designated Provider Care Teams.  These Care Teams include your primary Cardiologist (physician) and Advanced Practice Providers (APPs -  Physician Assistants and Nurse Practitioners) who all work together to provide you with the care you need, when you need it.  We recommend signing up for the patient portal called "MyChart".  Sign up information is provided on this After Visit Summary.  MyChart is used to connect with patients for Virtual Visits (Telemedicine).  Patients are able to view lab/test results, encounter notes, upcoming appointments, etc.  Non-urgent messages can be sent to your provider as well.   To learn more about what you can do with MyChart, go to [email or web address].    Your next appointment:   6 month(s)  The format for your next appointment:   In Person  Provider:   Cristopher Peru, MD   Other Instructions Thank you for choosing [facility]!

## 2020-01-15 NOTE — Progress Notes (Signed)
HPI [name] returns today for followup. He is a pleasant 79 yo man with persistent atrial fib, s/p DCCV, chronic amiodarone therapy, peripheral vascular disease, and HTN. Since his cardioversion, he notes improvement in his energy level. No palpitations.  Allergies  Allergen Reactions  . Hctz [Hydrochlorothiazide] Other (See Comments)    Hypokalemia      Current Outpatient Medications  Medication Sig Dispense Refill  . ALPRAZolam (XANAX) 1 MG tablet Take 1 mg by mouth daily as needed for anxiety.    Marland Kitchen amiodarone (PACERONE) 200 MG tablet Take 1 tablet (200 mg total) by mouth daily. 90 tablet 3  . apixaban (ELIQUIS) 5 MG TABS tablet Take 1 tablet (5 mg total) by mouth 2 (two) times daily. 60 tablet 0  . carvedilol (COREG) 3.125 MG tablet TAKE 1 TABLET (3.125 MG TOTAL) BY MOUTH 2 (TWO) TIMES DAILY WITH A MEAL. 60 tablet 3  . furosemide (LASIX) 20 MG tablet TAKE 2 TABLETS (40 MG TOTAL) BY MOUTH DAILY. 40 MG MON, WED & FRI 20MG  ON ALL OTHER DAYS 360 tablet 1  . glimepiride (AMARYL) 4 MG tablet Take 4 mg by mouth daily at 2 PM.     . iron polysaccharides (NIFEREX) 150 MG capsule Take 150 mg by mouth at bedtime.     Marland Kitchen losartan (COZAAR) 50 MG tablet Take 50 mg by mouth daily.    . nitroGLYCERIN (NITROSTAT) 0.4 MG SL tablet PLACE 1 TABLET (0.4 MG TOTAL) UNDER THE TONGUE EVERY 5 (FIVE) MINUTES AS NEEDED FOR CHEST PAIN. 25 tablet 2  . pioglitazone (ACTOS) 45 MG tablet Take 45 mg by mouth daily.     . potassium chloride (KLOR-CON) 10 MEQ tablet Take 1 tablet (10 mEq total) by mouth daily. 90 tablet 3  . rosuvastatin (CRESTOR) 10 MG tablet Take 1 tablet (10 mg total) by mouth daily. 90 tablet 3   No current facility-administered medications for this visit.     Past Medical History:  Diagnosis Date  . Acute inferior myocardial infarction (HCC)    hx  . Acute on chronic diastolic CHF (congestive heart failure), NYHA class 1 ([place]) 08/09/2014  . Anemia   . Atrial fibrillation ([place])   .  BPH (benign prostatic hyperplasia)   . CAD (coronary artery disease)    a. s/p CABG in 2005 with RIMA-OM, SVG-D1 and SVG-PDA b. cath in 2016 showing patent grafts  . Dyslipidemia   . History of blood transfusion    "I've had one; don't remember when"  . HTN (hypertension)   . Hyponatremia   . 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 Myocardial infarction [facility]) x2  1986, 2005  . 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 PAD (peripheral artery disease) ([place])   . Type II diabetes mellitus (HCC)     ROS:   All systems reviewed and negative except as noted in the HPI.   Past Surgical History:  Procedure Laterality Date  . CARDIAC CATHETERIZATION  12/23/2014   Procedure: Left Heart Cath and Cors/Grafts Angiography;  Surgeon: Peter M Martinique, MD;  Location: [place] [facility];  Service: Cardiovascular;;  . CARDIOVERSION N/A 05/05/2016   Procedure: CARDIOVERSION;  Surgeon: Lelon Perla, MD;  Location: [facility] ENDOSCOPY;  Service: Cardiovascular;  Laterality: N/A;  . CARDIOVERSION N/A 07/09/2019   Procedure: CARDIOVERSION;  Surgeon: Josue Hector, MD;  Location: [facility] ENDOSCOPY;  Service: Cardiovascular;  Laterality: N/A;  . CARDIOVERSION N/A 12/05/2019  Procedure: CARDIOVERSION;  Surgeon: Satira Sark, MD;  [facility];  Service: Cardiovascular;  Laterality: N/A;  . CATARACT EXTRACTION W/ INTRAOCULAR LENS  IMPLANT, BILATERAL Bilateral   . CORONARY ANGIOPLASTY WITH STENT PLACEMENT    . CORONARY ARTERY BYPASS GRAFT  1986; redo 2005   ; free Rima to OM, svg-diag,svg-pda  . ENDARTERECTOMY FEMORAL Bilateral 12/31/2014   Procedure: BILATERAL FEMORAL ENDARTERECTOMY;  Surgeon: Elam Dutch, MD;  Location: [place];  Service: Vascular;  Laterality: Bilateral;  . FEMORAL-FEMORAL BYPASS GRAFT Bilateral 12/31/2014   Procedure: FEMORAL-FEMORAL ARTERY BYPASS GRAFT;  Surgeon: Elam Dutch, MD;  Location: [place];  Service: Vascular;  Laterality: Bilateral;  . KYPHOPLASTY   02/29/2012   Procedure: KYPHOPLASTY;  Surgeon: Sinclair Ship, MD;  Location: [place];  Service: Orthopedics;  Laterality: Bilateral;  T 10 kyphoplasty  . LAPAROTOMY N/A 08/02/2014   Procedure: EXPLORATORY LAPAROTOMY LYSIS OF ADHESIONS;  Surgeon: Donnie Mesa, MD;  Location: [place];  Service: General;  Laterality: N/A;  . MELANOMA EXCISION     scalp  . PERIPHERAL VASCULAR CATHETERIZATION N/A 12/05/2014   Procedure: Abdominal Aortogram;  Surgeon: Elam Dutch, MD;  Location: [place] [facility];  Service: Cardiovascular;  Laterality: N/A;  . ROTATOR CUFF REPAIR Right   . TONSILLECTOMY       Family History  Problem Relation Age of Onset  . Dementia Mother      Social History   Socioeconomic History  . Marital status: Married    Spouse name: Not on file  . Number of children: 2  . Years of education: Not on file  . Highest education level: Not on file  Occupational History  . Occupation: Optician, dispensing: RETIRED  Tobacco Use  . Smoking status: Former Smoker    Packs/day: 2.00    Years: 25.00    Pack years: 50.00    Types: Cigarettes    Quit date: 02/22/1986    Years since quitting: 33.9  . Smokeless tobacco: Never Used  Vaping Use  . Vaping Use: Never used  Substance and Sexual Activity  . Alcohol use: Yes    Alcohol/week: 6.0 standard drinks    Types: 6 Cans of beer per week  . Drug use: No  . Sexual activity: Not Currently  Other Topics Concern  . Not on file  Social History Narrative  . Not on file   Social Determinants of Health   Financial Resource Strain:   . Difficulty of Paying Living Expenses: Not on file  Food Insecurity:   . Worried About Charity fundraiser in the Last Year: Not on file  . Ran Out of Food in the Last Year: Not on file  Transportation Needs:   . Lack of Transportation (Medical): Not on file  . Lack of Transportation (Non-Medical): Not on file  Physical Activity:   . Days of Exercise per Week: Not on file  . Minutes of  Exercise per Session: Not on file  Stress:   . Feeling of Stress : Not on file  Social Connections:   . Frequency of Communication with Friends and Family: Not on file  . Frequency of Social Gatherings with Friends and Family: Not on file  . Attends Religious Services: Not on file  . Active Member of Clubs or Organizations: Not on file  . Attends Archivist Meetings: Not on file  . Marital Status: Not on file  Intimate Partner Violence:   . Fear of  Current or Ex-Partner: Not on file  . Emotionally Abused: Not on file  . Physically Abused: Not on file  . Sexually Abused: Not on file     BP 122/70   Pulse 62   Ht 5\' 9"  (1.753 m)   Wt 170 lb (77.1 kg)   SpO2 97%   BMI 25.10 kg/m   Physical Exam:  Well appearing NAD HEENT: Unremarkable Neck:  No JVD, no thyromegally Lymphatics:  No adenopathy Back:  No CVA tenderness Lungs:  Clear HEART:  Regular rate rhythm, no murmurs, no rubs, no clicks Abd:  soft, positive bowel sounds, no organomegally, no rebound, no guarding Ext:  2 plus pulses, no edema, no cyanosis, no clubbing Skin:  No rashes no nodules Neuro:  CN II through XII intact, motor grossly intact  EKG - NSR with low atrial voltage  DEVICE  Normal device function.  See PaceArt for details.   Assess/Plan: 1. Persistent atrial fib - he is maintaining NSR. He will continue amio 200 daily. When I see him back in 6 months we will reduce his dose if he is maintaining NSR. 2. HTN - his bp is a little low and I have asked him to stop the coreg. 3. CAD - he denies anginal symptoms.  4. Dyslipidemia - he will continue crestor.  Carleene Overlie Shantell Belongia,MD

## 2020-02-29 NOTE — Progress Notes (Signed)
[name] Date of Birth: September 29, 1940 Medical Record #[id number]  History of Present Illness: [name] is seen for follow up Afib.  He has a history of coronary disease and is status post redo coronary bypass surgery in 2005 after emergent stenting of the left main. This included a free RIMA graft to the OM, SVG to diagonal, and SVG to PDA. LIMA to LAD was intact from original surgery. He has had an old anterior myocardial infarction.  In April 2016 he was admitted with SBO and underwent surgery with exploratory lap and lysis of adhesions. His post op course was complicated by marked volume overload due to IVF with over 20 lb weight gain. He was diuresed. Echo showed EF 45%.  In August 2016 he presented with progressive claudication in the left hip and leg. Dopplers showed severe PAD. Angiography was done by Dr. Oneida Alar and showed severe left iliac and bilateral common femoral disease. He had an abnormal Myoview study and cardiac cath was done and showed patent grafts.  He underwent surgery by Dr. Oneida Alar with bilateral femoral endarterectomy and fem- fem BPG.   Admit 01/04-01/07/18 w/ URI, CHF, new dx atrial fib. Rate control w/ Coreg & dig. Bradycardia w/ Cardizem. Started on Eliquis and Plavix discontinued. TSH nl, EF 40-45% CHA2DS2VASc=6 (age x 2, HTN, DM, CAD, CHF). He was anticoagulated for 4 weeks and underwent DCCV on 05/05/16. Losartan was held for elevated creatinine. This later returned to normal and losartan resumed at 50 mg daily.   He was seen in March of this year with complaints of increased LE edema. This resolved with lasix. Subsequently he followed up with [facility] on [facility]2019 and was noted to be in atrial fibrillation with slow ventricular response. His furosemide was increased to 40 mg Monday Wednesday Friday and he was instructed to take 20 mg on the other days his Lanoxin was stopped. he underwent DCCV on  07/09/2019. Unfortunately he had early recurrence of afib. He was  seen by Dr Lovena Le in June. Discussed AAD therapy with Tikosyn versus amiodarone. Not felt to be a candidate for ablation due to age and multiple co morbidities. Patient decided to try amiodarone and was started on po dose 200 mg bid. He underwent DCCV on 12/05/19. On follow up with Dr Lovena Le in October he was still in NSR.   LE arterial dopplers in April showed noncompressible vessels. Repeat in October at [facility] unchanged. Carotid dopplers showed 40-59% RICA stenosis. 7-10% on LICA.   On follow up today he is doing well. States he can tell a big difference in his energy when he is in NSR.   No chest pain or dyspnea.  He denies any claudication.  His LE edema is stable. Unable to tolerate compression hose due to allergic reaction.  No dizziness or orthopnea.   Current Outpatient Medications on File Prior to Visit  Medication Sig Dispense Refill  . ALPRAZolam (XANAX) 1 MG tablet Take 1 mg by mouth daily as needed for anxiety.    Marland Kitchen amiodarone (PACERONE) 200 MG tablet Take 1 tablet (200 mg total) by mouth daily. 90 tablet 3  . apixaban (ELIQUIS) 5 MG TABS tablet Take 1 tablet (5 mg total) by mouth 2 (two) times daily. 60 tablet 0  . furosemide (LASIX) 20 MG tablet TAKE 2 TABLETS (40 MG TOTAL) BY MOUTH DAILY. 40 MG MON, WED & FRI 20MG  ON ALL OTHER DAYS 360 tablet 1  . glimepiride (AMARYL) 4 MG tablet Take 4 mg by mouth  daily at 2 PM.     . iron polysaccharides (NIFEREX) 150 MG capsule Take 150 mg by mouth at bedtime.     Marland Kitchen losartan (COZAAR) 50 MG tablet Take 50 mg by mouth daily.    . nitroGLYCERIN (NITROSTAT) 0.4 MG SL tablet PLACE 1 TABLET (0.4 MG TOTAL) UNDER THE TONGUE EVERY 5 (FIVE) MINUTES AS NEEDED FOR CHEST PAIN. 25 tablet 2  . pioglitazone (ACTOS) 45 MG tablet Take 45 mg by mouth daily.     . potassium chloride (KLOR-CON) 10 MEQ tablet Take 1 tablet (10 mEq total) by mouth daily. 90 tablet 3  . rosuvastatin (CRESTOR) 10 MG tablet Take 1 tablet (10 mg total) by mouth daily. 90 tablet 3   No  current facility-administered medications on file prior to visit.    Allergies  Allergen Reactions  . Hctz [Hydrochlorothiazide] Other (See Comments)    Hypokalemia     Past Medical History:  Diagnosis Date  . Acute inferior myocardial infarction (HCC)    hx  . Acute on chronic diastolic CHF (congestive heart failure), NYHA class 1 ([place]) 08/09/2014  . Anemia   . Atrial fibrillation ([place])   . BPH (benign prostatic hyperplasia)   . CAD (coronary artery disease)    a. s/p CABG in 2005 with RIMA-OM, SVG-D1 and SVG-PDA b. cath in 2016 showing patent grafts  . Dyslipidemia   . History of blood transfusion    "I've had one; don't remember when"  . HTN (hypertension)   . Hyponatremia   . 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 Myocardial infarction [facility]) x2  1986, 2005  . 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 PAD (peripheral artery disease) ([place])   . Type II diabetes mellitus ([place])     Past Surgical History:  Procedure Laterality Date  . CARDIAC CATHETERIZATION  12/23/2014   Procedure: Left Heart Cath and Cors/Grafts Angiography;  Surgeon: Kalysta Kneisley M Martinique, MD;  Location: [place] [facility];  Service: Cardiovascular;;  . CARDIOVERSION N/A 05/05/2016   Procedure: CARDIOVERSION;  Surgeon: Lelon Perla, MD;  Location: [facility] ENDOSCOPY;  Service: Cardiovascular;  Laterality: N/A;  . CARDIOVERSION N/A 07/09/2019   Procedure: CARDIOVERSION;  Surgeon: Josue Hector, MD;  Location: [facility] ENDOSCOPY;  Service: Cardiovascular;  Laterality: N/A;  . CARDIOVERSION N/A 12/05/2019   Procedure: CARDIOVERSION;  Surgeon: Satira Sark, MD;  [facility];  Service: Cardiovascular;  Laterality: N/A;  . CATARACT EXTRACTION W/ INTRAOCULAR LENS  IMPLANT, BILATERAL Bilateral   . CORONARY ANGIOPLASTY WITH STENT PLACEMENT    . CORONARY ARTERY BYPASS GRAFT  1986; redo 2005   ; free Rima to OM, svg-diag,svg-pda  . ENDARTERECTOMY FEMORAL Bilateral 12/31/2014   Procedure: BILATERAL  FEMORAL ENDARTERECTOMY;  Surgeon: Elam Dutch, MD;  Location: [place];  Service: Vascular;  Laterality: Bilateral;  . FEMORAL-FEMORAL BYPASS GRAFT Bilateral 12/31/2014   Procedure: FEMORAL-FEMORAL ARTERY BYPASS GRAFT;  Surgeon: Elam Dutch, MD;  Location: [place];  Service: Vascular;  Laterality: Bilateral;  . KYPHOPLASTY  02/29/2012   Procedure: KYPHOPLASTY;  Surgeon: Sinclair Ship, MD;  Location: [place];  Service: Orthopedics;  Laterality: Bilateral;  T 10 kyphoplasty  . LAPAROTOMY N/A 08/02/2014   Procedure: EXPLORATORY LAPAROTOMY LYSIS OF ADHESIONS;  Surgeon: Donnie Mesa, MD;  Location: [place];  Service: General;  Laterality: N/A;  . MELANOMA EXCISION     scalp  . PERIPHERAL VASCULAR CATHETERIZATION N/A 12/05/2014   Procedure: Abdominal Aortogram;  Surgeon:  Elam Dutch, MD;  Location: [place] [facility];  Service: Cardiovascular;  Laterality: N/A;  . ROTATOR CUFF REPAIR Right   . TONSILLECTOMY      Social History   Tobacco Use  Smoking Status Former Smoker  . Packs/day: 2.00  . Years: 25.00  . Pack years: 50.00  . Types: Cigarettes  . Quit date: 02/22/1986  . Years since quitting: 34.0  Smokeless Tobacco Never Used    Social History   Substance and Sexual Activity  Alcohol Use Yes  . Alcohol/week: 6.0 standard drinks  . Types: 6 Cans of beer per week    Family History  Problem Relation Age of Onset  . Dementia Mother     Review of Systems: As noted in history of present illness.  All other systems were reviewed and are negative.  Physical Exam: BP (!) 117/46   Pulse 64   Ht 5\' 9"  (1.753 m)   Wt 169 lb 6.4 oz (76.8 kg)   SpO2 100%   BMI 25.02 kg/m  GENERAL:  Well appearing WM in NAD HEENT:  PERRL, EOMI, sclera are clear. Oropharynx is clear. NECK:  No jugular venous distention, carotid upstroke brisk and symmetric, right carotid bruit, no thyromegaly or adenopathy LUNGS:  Clear to auscultation bilaterally CHEST:  Unremarkable HEART:  RRR,  PMI  not displaced or sustained,S1 and S2 within normal limits, no S3, no S4: no clicks, no rubs, no murmurs ABD:  Soft, nontender. BS +, no masses or bruits. No hepatomegaly, no splenomegaly EXT:  2 + pulses throughout, tr-1+ edema, no cyanosis no clubbing SKIN:  Warm and dry.  No rashes NEURO:  Alert and oriented x 3. Cranial nerves II through XII intact. PSYCH:  Cognitively intact   LABORATORY DATA: Lab Results  Component Value Date   WBC 4.9 12/03/2019   HGB 11.1 (L) 12/03/2019   HCT 32.6 (L) 12/03/2019   PLT 132 (L) 12/03/2019   GLUCOSE 105 (H) 12/03/2019   CHOL 105 04/02/2016   TRIG 54 04/02/2016   HDL 51 04/02/2016   LDLCALC 43 04/02/2016   ALT 15 (L) 12/25/2014   AST 24 12/25/2014   NA 136 12/03/2019   K 4.0 12/03/2019   CL 102 12/03/2019   CREATININE 1.52 (H) 12/03/2019   BUN 24 (H) 12/03/2019   CO2 22 12/03/2019   TSH 1.009 03/31/2016   INR 1.9 (H) 12/05/2019   HGBA1C 6.9 (H) 12/03/2019   Dated 12/09/16: cholesterol 135, triglycerides 48, HDL 59, LDL 66. A1c 6.6%. Hgb 12. CMET and TSH normal Dated 07/11/19: A1c 6.6%. BUN 23. Creatinine 1.17. otherwise CMET normal. Cholesterol 139, triglycerides 40, HDL 78, LDL 43. Dig level 0.4 Dated 02/10/20: A1c 7.3%.   ECHO: 04/02/2016 - Left ventricle: The cavity size was mildly dilated. Wall thickness was normal. Systolic function was mildly to moderately reduced. The estimated ejection fraction was in the range of 40% to 45%. There is akinesis of the inferolateral myocardium. - Mitral valve: Calcified annulus. There was mild regurgitation. - Aortic valve: Indexed valve area (VTI): 1.11 cm^2/m^2. Peak velocity ratio of LVOT to aortic valve: 0.71. Mean gradient (S): 5 mm Hg. Peak gradient (S): 9 mm Hg. - Left atrium: The atrium was moderately dilated. - Pulmonary arteries: Systolic pressure was moderately increased. PA peak pressure: 51 mm Hg (S). Impressions: - Akinesis of the inferolateral wall with overall mild to  moderate LV dysfunction; mild MR; moderate LAE; mild TR; moderately elevated pulmonary pressure.   Assessment / Plan: 1.  Coronary disease status post redo CABG in 2005. Remote anterior myocardial infarction. Prior left main stent. Cardiac cath in September 2016 showed patent grafts. Continue medical therapy.   2. Chronic systolic/diastolic CHF EF 81-01%. He has stable class 2 symptoms. Clinically improved with restoration of NSR.  Continue current therapy with lasix, Coreg, and losartan  3. Atrial fibrillation s/p DCCV on 05/05/16 and again this year with ERAD. Now on amiodarone. [place] Vasc score of 6. Will continue Eliquis long term. He underwent DCCV in September and has been maintaining NSR so far.   4. Hyperlipidemia on Zocor. Excellent control  5. Severe PAD- S/p bilateral femoral endarterectomy and fem-fem bypass. Stressed importance of regular aerobic walking. Seen by [facility] in October and LE arterial dopplers and carotid dopplers were stable.   6. DM type 2  7. HTN

## 2020-03-05 ENCOUNTER — Ambulatory Visit: Payer: Medicare Other | Admitting: Cardiology

## 2020-03-05 ENCOUNTER — Other Ambulatory Visit: Payer: Self-pay

## 2020-03-05 ENCOUNTER — Encounter: Payer: Self-pay | Admitting: Cardiology

## 2020-03-05 VITALS — BP 117/46 | HR 64 | Ht 69.0 in | Wt 169.4 lb

## 2020-03-05 DIAGNOSIS — I5042 Chronic combined systolic (congestive) and diastolic (congestive) heart failure: Secondary | ICD-10-CM

## 2020-03-05 DIAGNOSIS — I480 Paroxysmal atrial fibrillation: Secondary | ICD-10-CM | POA: Diagnosis not present

## 2020-03-05 DIAGNOSIS — I25810 Atherosclerosis of coronary artery bypass graft(s) without angina pectoris: Secondary | ICD-10-CM

## 2020-03-05 DIAGNOSIS — I739 Peripheral vascular disease, unspecified: Secondary | ICD-10-CM | POA: Diagnosis not present

## 2020-03-29 ENCOUNTER — Other Ambulatory Visit: Payer: Self-pay | Admitting: Cardiology

## 2020-05-04 ENCOUNTER — Other Ambulatory Visit: Payer: Self-pay | Admitting: Cardiology

## 2020-06-09 ENCOUNTER — Other Ambulatory Visit: Payer: Self-pay

## 2020-06-09 DIAGNOSIS — I739 Peripheral vascular disease, unspecified: Secondary | ICD-10-CM

## 2020-06-09 DIAGNOSIS — I6523 Occlusion and stenosis of bilateral carotid arteries: Secondary | ICD-10-CM

## 2020-07-14 ENCOUNTER — Other Ambulatory Visit: Payer: Self-pay

## 2020-07-14 ENCOUNTER — Ambulatory Visit (INDEPENDENT_AMBULATORY_CARE_PROVIDER_SITE_OTHER)
Admission: RE | Admit: 2020-07-14 | Discharge: 2020-07-14 | Disposition: A | Discharge status: Home / Self Care | Payer: Medicare Other | Source: Ambulatory Visit | Attending: Physician Assistant | Admitting: Physician Assistant

## 2020-07-14 ENCOUNTER — Ambulatory Visit: Payer: Medicare Other | Admitting: Vascular Surgery

## 2020-07-14 ENCOUNTER — Encounter: Payer: Self-pay | Admitting: Vascular Surgery

## 2020-07-14 ENCOUNTER — Ambulatory Visit (HOSPITAL_COMMUNITY)
Admission: RE | Admit: 2020-07-14 | Discharge: 2020-07-14 | Disposition: A | Discharge status: Home / Self Care | Payer: Medicare Other | Source: Ambulatory Visit | Attending: Vascular Surgery | Admitting: Vascular Surgery

## 2020-07-14 VITALS — BP 126/39 | HR 53 | Temp 97.8°F | Resp 16 | Ht 69.0 in | Wt 159.9 lb

## 2020-07-14 DIAGNOSIS — I739 Peripheral vascular disease, unspecified: Secondary | ICD-10-CM

## 2020-07-14 DIAGNOSIS — I6523 Occlusion and stenosis of bilateral carotid arteries: Secondary | ICD-10-CM | POA: Diagnosis not present

## 2020-07-14 NOTE — Progress Notes (Signed)
VASCULAR AND VEIN SPECIALISTS OF [place]  ASSESSMENT / PLAN: [name] is a 80 y.o. male with atherosclerosis of native arteries of bilateral lower extremities status post bilateral common femoral artery endarterectomies and right-toleft femoral-femoral bypass with dacron 01/10/15. He is currently asymptomatic. He has asymptomatic mild bilateral carotid artery stenosis.   Patient counseled patients with asymptomatic peripheral arterial disease or claudication have a 1-2% risk of developing chronic limb threatening ischemia, but a 15-30% risk of mortality in the next 5 years. Intervention should only be considered for medically optimized patients with disabling symptoms. .  Recommend the following which can slow the progression of atherosclerosis and reduce the risk of major adverse cardiac / limb events:  Complete cessation from all tobacco products. Blood glucose control with goal A1c < 7%. Blood pressure control with goal blood pressure < 140/90 mmHg. Lipid reduction therapy with goal LDL-C <100 mg/dL (<70 if symptomatic from PAD).  Aspirin 81mg  PO QD.  Atorvastatin 40-80mg  PO QD (or other "high intensity" statin therapy).  Continue surveillance of bypass graft and carotid stenosis with yearly ABI, bypass duplex, and carotid duplex.    CHIEF COMPLAINT: surveillance of bypass  HISTORY OF PRESENT ILLNESS: [name] is a 80 y.o. male with known history of peripheral arterial disease.  He is status post bilateral common femoral endarterectomies and right left femoral-femoral bypass with Dacron 01/10/2015 by Dr. Oneida Alar.  He has done very well since this.  He can ambulate for 20 minutes at a time.  He does have some difficulty with instability.  He does not report claudication per se.  He denies ischemic rest pain.  He denies ischemic ulceration.  Past Medical History:  Diagnosis Date  . Acute inferior myocardial infarction (HCC)    hx  . Acute on chronic diastolic CHF (congestive  heart failure), NYHA class 1 ([place]) 08/09/2014  . Anemia   . Atrial fibrillation ([place])   . BPH (benign prostatic hyperplasia)   . CAD (coronary artery disease)    a. s/p CABG in 2005 with RIMA-OM, SVG-D1 and SVG-PDA b. cath in 2016 showing patent grafts  . Dyslipidemia   . History of blood transfusion    "I've had one; don't remember when"  . HTN (hypertension)   . Hyponatremia   . 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 Myocardial infarction [facility]) x2  1986, 2005  . 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 PAD (peripheral artery disease) ([place])   . Type II diabetes mellitus ([place])     Past Surgical History:  Procedure Laterality Date  . CARDIAC CATHETERIZATION  12/23/2014   Procedure: Left Heart Cath and Cors/Grafts Angiography;  Surgeon: Peter M Martinique, MD;  Location: [place] CV [facility];  Service: Cardiovascular;;  . CARDIOVERSION N/A 05/05/2016   Procedure: CARDIOVERSION;  Surgeon: Lelon Perla, MD;  Location: [facility] ENDOSCOPY;  Service: Cardiovascular;  Laterality: N/A;  . CARDIOVERSION N/A 07/09/2019   Procedure: CARDIOVERSION;  Surgeon: Josue Hector, MD;  Location: [facility] ENDOSCOPY;  Service: Cardiovascular;  Laterality: N/A;  . CARDIOVERSION N/A 12/05/2019   Procedure: CARDIOVERSION;  Surgeon: Satira Sark, MD;  [facility];  Service: Cardiovascular;  Laterality: N/A;  . CATARACT EXTRACTION W/ INTRAOCULAR LENS  IMPLANT, BILATERAL Bilateral   . CORONARY ANGIOPLASTY WITH STENT PLACEMENT    . CORONARY ARTERY BYPASS GRAFT  1986; redo 2005   ; free Rima to OM, svg-diag,svg-pda  . ENDARTERECTOMY FEMORAL Bilateral 12/31/2014   Procedure: BILATERAL  FEMORAL ENDARTERECTOMY;  Surgeon: Elam Dutch, MD;  Location: [place];  Service: Vascular;  Laterality: Bilateral;  . FEMORAL-FEMORAL BYPASS GRAFT Bilateral 12/31/2014   Procedure: FEMORAL-FEMORAL ARTERY BYPASS GRAFT;  Surgeon: Elam Dutch, MD;  Location: [place];  Service: Vascular;  Laterality: Bilateral;  .  KYPHOPLASTY  02/29/2012   Procedure: KYPHOPLASTY;  Surgeon: Sinclair Ship, MD;  Location: [place];  Service: Orthopedics;  Laterality: Bilateral;  T 10 kyphoplasty  . LAPAROTOMY N/A 08/02/2014   Procedure: EXPLORATORY LAPAROTOMY LYSIS OF ADHESIONS;  Surgeon: Donnie Mesa, MD;  Location: [place];  Service: General;  Laterality: N/A;  . MELANOMA EXCISION     scalp  . PERIPHERAL VASCULAR CATHETERIZATION N/A 12/05/2014   Procedure: Abdominal Aortogram;  Surgeon: Elam Dutch, MD;  Location: [place] CV LAB;  Service: Cardiovascular;  Laterality: N/A;  . ROTATOR CUFF REPAIR Right   . TONSILLECTOMY      Family History  Problem Relation Age of Onset  . Dementia Mother     Social History   Socioeconomic History  . Marital status: Married    Spouse name: Not on file  . Number of children: 2  . Years of education: Not on file  . Highest education level: Not on file  Occupational History  . Occupation: Optician, dispensing: RETIRED  Tobacco Use  . Smoking status: Former Smoker    Packs/day: 2.00    Years: 25.00    Pack years: 50.00    Types: Cigarettes    Quit date: 02/22/1986    Years since quitting: 34.4  . Smokeless tobacco: Never Used  Vaping Use  . Vaping Use: Never used  Substance and Sexual Activity  . Alcohol use: Yes    Alcohol/week: 6.0 standard drinks    Types: 6 Cans of beer per week  . Drug use: No  . Sexual activity: Not Currently  Other Topics Concern  . Not on file  Social History Narrative  . Not on file   Social Determinants of Health   Financial Resource Strain: Not on file  Food Insecurity: Not on file  Transportation Needs: Not on file  Physical Activity: Not on file  Stress: Not on file  Social Connections: Not on file  Intimate Partner Violence: Not on file    Allergies  Allergen Reactions  . Hctz [Hydrochlorothiazide] Other (See Comments)    Hypokalemia     Current Outpatient Medications  Medication Sig Dispense Refill  .  ALPRAZolam (XANAX) 1 MG tablet Take 1 mg by mouth daily as needed for anxiety.    Marland Kitchen amiodarone (PACERONE) 200 MG tablet Take 1 tablet (200 mg total) by mouth daily. 90 tablet 3  . apixaban (ELIQUIS) 5 MG TABS tablet Take 1 tablet (5 mg total) by mouth 2 (two) times daily. 60 tablet 0  . furosemide (LASIX) 20 MG tablet TAKE 2 TABLETS (40 MG TOTAL) BY MOUTH DAILY. 40 MG MON, WED & FRI 20MG  ON ALL OTHER DAYS 360 tablet 1  . glimepiride (AMARYL) 4 MG tablet Take 4 mg by mouth daily at 2 PM.     . iron polysaccharides (NIFEREX) 150 MG capsule Take 150 mg by mouth at bedtime.     Marland Kitchen losartan (COZAAR) 50 MG tablet Take 50 mg by mouth daily.    . pioglitazone (ACTOS) 45 MG tablet Take 45 mg by mouth daily.     . nitroGLYCERIN (NITROSTAT) 0.4 MG SL tablet PLACE 1 TABLET (0.4 MG TOTAL) UNDER THE TONGUE  EVERY 5 (FIVE) MINUTES AS NEEDED FOR CHEST PAIN. (Patient not taking: Reported on 07/14/2020) 25 tablet 2  . potassium chloride (KLOR-CON) 10 MEQ tablet Take 1 tablet (10 mEq total) by mouth daily. 90 tablet 3  . rosuvastatin (CRESTOR) 10 MG tablet Take 1 tablet (10 mg total) by mouth daily. 90 tablet 3   No current facility-administered medications for this visit.    REVIEW OF SYSTEMS:  [X]  denotes positive finding, [ ]  denotes negative finding Cardiac  Comments:  Chest pain or chest pressure:    Shortness of breath upon exertion:    Short of breath when lying flat:    Irregular heart rhythm:        Vascular    Pain in calf, thigh, or hip brought on by ambulation: x Pain in left hip with walking  Pain in feet at night that wakes you up from your sleep:     Blood clot in your veins:    Leg swelling:         Pulmonary    Oxygen at home:    Productive cough:     Wheezing:         Neurologic    Sudden weakness in arms or legs:  x Left leg feels "unsteady"  Sudden numbness in arms or legs:     Sudden onset of difficulty speaking or slurred speech:    Temporary loss of vision in one eye:      Problems with dizziness:         Gastrointestinal    Blood in stool:     Vomited blood:         Genitourinary    Burning when urinating:     Blood in urine:        Psychiatric    Major depression:         Hematologic    Bleeding problems:    Problems with blood clotting too easily:        Skin    Rashes or ulcers:        Constitutional    Fever or chills:      PHYSICAL EXAM  Vitals:   07/14/20 1414  BP: (!) 126/39  Pulse: (!) 53  Resp: 16  Temp: 97.8 F (36.6 C)  TempSrc: Temporal  SpO2: 98%  Weight: 159 lb 14.4 oz (72.5 kg)  Height: 5\' 9"  (1.753 m)    Constitutional: elderly. no distress. Appears well nourished.  Neurologic: CN intact. no focal findings. no sensory loss. Psychiatric: Mood and affect symmetric and appropriate. Eyes: No icterus. No conjunctival pallor. Ears, nose, throat: mucous membranes moist. Midline trachea.  Cardiac: regular rate and rhythm.  Respiratory: unlabored. Abdominal: soft, non-tender, non-distended.  Peripheral vascular:  No palpable pedal pulses  Feet are warm and well perfused Extremity: No edema. No cyanosis. No pallor.  Skin: No gangrene. No ulceration.  Lymphatic: No Stemmer's sign. No palpable lymphadenopathy.  PERTINENT LABORATORY AND RADIOLOGIC DATA  Most recent CBC CBC Latest Ref Rng & Units 12/03/2019 04/29/2016 04/03/2016  WBC 4.0 - 10.5 K/uL 4.9 4.3 5.2  Hemoglobin 13.0 - 17.0 g/dL 11.1(L) 10.6(L) 9.2(L)  Hematocrit 39.0 - 52.0 % 32.6(L) 31.8(L) 27.1(L)  Platelets 150 - 400 K/uL 132(L) 144 191     Most recent CMP CMP Latest Ref Rng & Units 12/03/2019 07/05/2019 06/07/2019  Glucose 70 - 99 mg/dL 105(H) 90 108(H)  BUN 8 - 23 mg/dL 24(H) 23 16  Creatinine 0.61 - 1.24 mg/dL 1.52(H) 1.28(H) 1.24  Sodium 135 - 145 mmol/L 136 138 142  Potassium 3.5 - 5.1 mmol/L 4.0 4.5 4.3  Chloride 98 - 111 mmol/L 102 101 103  CO2 22 - 32 mmol/L 22 24 22   Calcium 8.9 - 10.3 mg/dL 9.7 9.5 9.6  Total Protein 6.5 - 8.1 g/dL - - -   Total Bilirubin 0.3 - 1.2 mg/dL - - -  Alkaline Phos 38 - 126 U/L - - -  AST 15 - 41 U/L - - -  ALT 17 - 63 U/L - - -    Renal function CrCl cannot be calculated (Patient's most recent lab result is older than the maximum 21 days allowed.).  Hgb A1c MFr Bld (%)  Date Value  12/03/2019 6.9 (H)    LDL Cholesterol  Date Value Ref Range Status  04/02/2016 43 0 - 99 mg/dL Final    Comment:           Total Cholesterol/HDL:CHD Risk Coronary Heart Disease Risk Table                     Men   Women  1/2 Average Risk   3.4   3.3  Average Risk       5.0   4.4  2 X Average Risk   9.6   7.1  3 X Average Risk  23.4   11.0        Use the calculated Patient Ratio above and the CHD Risk Table to determine the patient's CHD Risk.        ATP III CLASSIFICATION (LDL):  <100     mg/dL   Optimal  100-129  mg/dL   Near or Above                    Optimal  130-159  mg/dL   Borderline  160-189  mg/dL   High  >190     mg/dL   Very High      Vascular Imaging: ABI 07/14/20 Non-compressible tibial vessels  Toe pressures >100 bilaterally  Fem-fem duplex 07/14/20 ~50% stenosis at inflow and outflow anastomoses  Carotid duplex 07/14/20 40-59% RICA stenosis 5-68% LICA stenosis   Yevonne Aline. Stanford Breed, MD [facility]in Specialists [facility] Phone Number: [phone] 07/14/2020 3:14 PM

## 2020-07-28 ENCOUNTER — Other Ambulatory Visit: Payer: Self-pay

## 2020-07-28 ENCOUNTER — Ambulatory Visit: Payer: Medicare Other | Admitting: Internal Medicine

## 2020-07-28 ENCOUNTER — Encounter: Payer: Self-pay | Admitting: Internal Medicine

## 2020-07-28 VITALS — BP 99/32 | HR 61 | Ht 69.0 in | Wt 163.4 lb

## 2020-07-28 DIAGNOSIS — I4819 Other persistent atrial fibrillation: Secondary | ICD-10-CM | POA: Diagnosis not present

## 2020-07-28 DIAGNOSIS — I25810 Atherosclerosis of coronary artery bypass graft(s) without angina pectoris: Secondary | ICD-10-CM | POA: Diagnosis not present

## 2020-07-28 NOTE — Progress Notes (Signed)
HPI [name] returns today for followup. He is a pleasant 80 yo man with persistent atrial fib, s/p DCCV, chronic amiodarone therapy, peripheral vascular disease, and HTN. Since his cardioversion, he notes improvement in his energy level. No palpitations. No cough. No edema. Allergies  Allergen Reactions  . Hctz [Hydrochlorothiazide] Other (See Comments)    Hypokalemia      Current Outpatient Medications  Medication Sig Dispense Refill  . ALPRAZolam (XANAX) 1 MG tablet Take 1 mg by mouth daily as needed for anxiety.    Marland Kitchen amiodarone (PACERONE) 200 MG tablet Take 1 tablet (200 mg total) by mouth daily. 90 tablet 3  . apixaban (ELIQUIS) 5 MG TABS tablet Take 1 tablet (5 mg total) by mouth 2 (two) times daily. 60 tablet 0  . furosemide (LASIX) 20 MG tablet TAKE 2 TABLETS (40 MG TOTAL) BY MOUTH DAILY. 40 MG MON, WED & FRI 20MG  ON ALL OTHER DAYS 360 tablet 1  . glimepiride (AMARYL) 4 MG tablet Take 4 mg by mouth daily at 2 PM.     . iron polysaccharides (NIFEREX) 150 MG capsule Take 150 mg by mouth at bedtime.     Marland Kitchen losartan (COZAAR) 50 MG tablet Take 50 mg by mouth daily.    . nitroGLYCERIN (NITROSTAT) 0.4 MG SL tablet PLACE 1 TABLET (0.4 MG TOTAL) UNDER THE TONGUE EVERY 5 (FIVE) MINUTES AS NEEDED FOR CHEST PAIN. 25 tablet 2  . pioglitazone (ACTOS) 45 MG tablet Take 45 mg by mouth daily.     . potassium chloride (KLOR-CON) 10 MEQ tablet Take 1 tablet (10 mEq total) by mouth daily. 90 tablet 3  . rosuvastatin (CRESTOR) 10 MG tablet Take 1 tablet (10 mg total) by mouth daily. 90 tablet 3   No current facility-administered medications for this visit.     Past Medical History:  Diagnosis Date  . Acute inferior myocardial infarction (HCC)    hx  . Acute on chronic diastolic CHF (congestive heart failure), NYHA class 1 ([place]) 08/09/2014  . Anemia   . Atrial fibrillation ([place])   . BPH (benign prostatic hyperplasia)   . CAD (coronary artery disease)    a. s/p CABG in 2005 with  RIMA-OM, SVG-D1 and SVG-PDA b. cath in 2016 showing patent grafts  . Dyslipidemia   . History of blood transfusion    "I've had one; don't remember when"  . HTN (hypertension)   . Hyponatremia   . 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 Myocardial infarction [facility]) x2  1986, 2005  . 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 PAD (peripheral artery disease) ([place])   . Type II diabetes mellitus (HCC)     ROS:   All systems reviewed and negative except as noted in the HPI.   Past Surgical History:  Procedure Laterality Date  . CARDIAC CATHETERIZATION  12/23/2014   Procedure: Left Heart Cath and Cors/Grafts Angiography;  Surgeon: Peter M Martinique, MD;  Location: [place] CV LAB;  Service: Cardiovascular;;  . CARDIOVERSION N/A 05/05/2016   Procedure: CARDIOVERSION;  Surgeon: Lelon Perla, MD;  Location: [facility] ENDOSCOPY;  Service: Cardiovascular;  Laterality: N/A;  . CARDIOVERSION N/A 07/09/2019   Procedure: CARDIOVERSION;  Surgeon: Josue Hector, MD;  Location: [facility] ENDOSCOPY;  Service: Cardiovascular;  Laterality: N/A;  . CARDIOVERSION N/A 12/05/2019   Procedure: CARDIOVERSION;  Surgeon: Satira Sark, MD;  [facility]O [facility];  Service: Cardiovascular;  Laterality: N/A;  . CATARACT  EXTRACTION W/ INTRAOCULAR LENS  IMPLANT, BILATERAL Bilateral   . CORONARY ANGIOPLASTY WITH STENT PLACEMENT    . CORONARY ARTERY BYPASS GRAFT  1986; redo 2005   ; free Rima to OM, svg-diag,svg-pda  . ENDARTERECTOMY FEMORAL Bilateral 12/31/2014   Procedure: BILATERAL FEMORAL ENDARTERECTOMY;  Surgeon: Elam Dutch, MD;  Location: [place];  Service: Vascular;  Laterality: Bilateral;  . FEMORAL-FEMORAL BYPASS GRAFT Bilateral 12/31/2014   Procedure: FEMORAL-FEMORAL ARTERY BYPASS GRAFT;  Surgeon: Elam Dutch, MD;  Location: [place];  Service: Vascular;  Laterality: Bilateral;  . KYPHOPLASTY  02/29/2012   Procedure: KYPHOPLASTY;  Surgeon: Sinclair Ship, MD;  Location: [place];  Service:  Orthopedics;  Laterality: Bilateral;  T 10 kyphoplasty  . LAPAROTOMY N/A 08/02/2014   Procedure: EXPLORATORY LAPAROTOMY LYSIS OF ADHESIONS;  Surgeon: Donnie Mesa, MD;  Location: [place];  Service: General;  Laterality: N/A;  . MELANOMA EXCISION     scalp  . PERIPHERAL VASCULAR CATHETERIZATION N/A 12/05/2014   Procedure: Abdominal Aortogram;  Surgeon: Elam Dutch, MD;  Location: [place] CV LAB;  Service: Cardiovascular;  Laterality: N/A;  . ROTATOR CUFF REPAIR Right   . TONSILLECTOMY       Family History  Problem Relation Age of Onset  . Dementia Mother      Social History   Socioeconomic History  . Marital status: Married    Spouse name: Not on file  . Number of children: 2  . Years of education: Not on file  . Highest education level: Not on file  Occupational History  . Occupation: Optician, dispensing: RETIRED  Tobacco Use  . Smoking status: Former Smoker    Packs/day: 2.00    Years: 25.00    Pack years: 50.00    Types: Cigarettes    Quit date: 02/22/1986    Years since quitting: 34.4  . Smokeless tobacco: Never Used  Vaping Use  . Vaping Use: Never used  Substance and Sexual Activity  . Alcohol use: Yes    Alcohol/week: 6.0 standard drinks    Types: 6 Cans of beer per week  . Drug use: No  . Sexual activity: Not Currently  Other Topics Concern  . Not on file  Social History Narrative  . Not on file   Social Determinants of Health   Financial Resource Strain: Not on file  Food Insecurity: Not on file  Transportation Needs: Not on file  Physical Activity: Not on file  Stress: Not on file  Social Connections: Not on file  Intimate Partner Violence: Not on file     Ht 5\' 9"  (1.753 m)   Wt 163 lb 6.4 oz (74.1 kg)   BMI 24.13 kg/m   Physical Exam:  Well appearing NAD HEENT: Unremarkable Neck:  No JVD, no thyromegally Lymphatics:  No adenopathy Back:  No CVA tenderness Lungs:  Clear with no wheezes HEART:  Regular rate rhythm, no murmurs,  no rubs, no clicks Abd:  soft, positive bowel sounds, no organomegally, no rebound, no guarding Ext:  2 plus pulses, no edema, no cyanosis, no clubbing Skin:  No rashes no nodules Neuro:  CN II through XII intact, motor grossly intact  DEVICE  Normal device function.  See PaceArt for details.   Assess/Plan: 1. Persistent atrial fib - he is maintaining NSR. He will continue amio 200 daily. When I see him back in 12 months we will reduce his dose if he is maintaining NSR. 2. HTN - his bp is a  little low and I have asked him to stop the coreg. 3. CAD - he denies anginal symptoms.  4. Dyslipidemia - he will continue crestor.  Kyle Overlie Sharri Loya,MD

## 2020-07-28 NOTE — Patient Instructions (Signed)
Medication Instructions:  Your physician recommends that you continue on your current medications as directed. Please refer to the Current Medication list given to you today.  *If you need a refill on your cardiac medications before your next appointment, please call your pharmacy*   Lab Work: NONE   If you have labs (blood work) drawn today and your tests are completely normal, you will receive your results only by: . MyChart Message (if you have MyChart) OR . A paper copy in the mail If you have any lab test that is abnormal or we need to change your treatment, we will call you to review the results.   Testing/Procedures: NONE    Follow-Up: At [facility], you and your health needs are our priority.  As part of our continuing mission to provide you with exceptional heart care, we have created designated Provider Care Teams.  These Care Teams include your primary Cardiologist (physician) and Advanced Practice Providers (APPs -  Physician Assistants and Nurse Practitioners) who all work together to provide you with the care you need, when you need it.  We recommend signing up for the patient portal called "MyChart".  Sign up information is provided on this After Visit Summary.  MyChart is used to connect with patients for Virtual Visits (Telemedicine).  Patients are able to view lab/test results, encounter notes, upcoming appointments, etc.  Non-urgent messages can be sent to your provider as well.   To learn more about what you can do with MyChart, go to [email or web address].    Your next appointment:   1 year(s)  The format for your next appointment:   In Person  Provider:   Gregg Taylor, MD   Other Instructions Thank you for choosing [facility]!    

## 2020-09-02 ENCOUNTER — Telehealth: Payer: Self-pay | Admitting: Internal Medicine

## 2020-09-02 ENCOUNTER — Other Ambulatory Visit: Payer: Self-pay | Admitting: General Practice

## 2020-09-02 ENCOUNTER — Other Ambulatory Visit: Payer: Self-pay | Admitting: Internal Medicine

## 2020-09-02 ENCOUNTER — Other Ambulatory Visit: Payer: Self-pay | Admitting: Cardiology

## 2020-09-02 DIAGNOSIS — I10 Essential (primary) hypertension: Secondary | ICD-10-CM

## 2020-09-02 MED ORDER — NITROGLYCERIN 0.4 MG SL SUBL: 0.4000 mg | SUBLINGUAL_TABLET | SUBLINGUAL | 2 refills | Status: DC | PRN

## 2020-09-02 NOTE — Telephone Encounter (Signed)
  *  STAT* If patient is at the pharmacy, call can be transferred to refill team.   1. Which medications need to be refilled? (please list name of each medication and dose if known) ALPRAZolam (XANAX) 1 MG tablet  2. Which pharmacy/location (including street and city if local pharmacy) is medication to be sent to? [id number] [place] [place] [address]  3. Do they need a 30 day or 90 day supply? 90 days

## 2020-09-02 NOTE — Telephone Encounter (Signed)
Called [name] to let him know that Dr. Lovena Le would not be able to refill his Xanax 1 mg tablet medication. [name] said it was a mix up and it should have gone to PCP.

## 2020-09-02 NOTE — Addendum Note (Signed)
Addended by: Alvin Critchley on: 09/02/2020 04:40 PM   Modules accepted: Orders

## 2020-09-02 NOTE — Telephone Encounter (Signed)
  *  STAT* If patient is at the pharmacy, call can be transferred to refill team.   1. Which medications need to be refilled? (please list name of each medication and dose if known)  nitroGLYCERIN (NITROSTAT) 0.4 MG SL tablet apixaban (ELIQUIS) 5 MG TABS tablet  2. Which pharmacy/location (including street and city if local pharmacy) is medication to be sent to? [id number] [place] [place] [facility] [address]  3. Do they need a 30 day or 90 day supply? 90 days

## 2020-09-02 NOTE — Telephone Encounter (Signed)
This is a [place] pt.  °

## 2020-09-02 NOTE — Telephone Encounter (Signed)
This is a [place] pt.  °

## 2020-09-02 NOTE — Telephone Encounter (Signed)
20m, 74.1kg, scr 1.52 12/03/19, lovw/taylor 07/28/20 pt request refill for eliquis 5mg  but only qualifies for 2.5mg  based on scr increase will route to pharmd pool

## 2020-09-03 MED ORDER — APIXABAN 5 MG PO TABS: 5.0000 mg | ORAL_TABLET | Freq: Two times a day (BID) | ORAL | 0 refills | Status: DC

## 2020-09-03 NOTE — Addendum Note (Signed)
Addended by: Allean Found on: 09/03/2020 01:09 PM   Modules accepted: Orders

## 2020-09-30 NOTE — Progress Notes (Signed)
[name] Date of Birth: 19-May-1940 Medical Record #[id number]  History of Present Illness: [name] is seen for follow up Afib.  He has a history of coronary disease and is status post redo coronary bypass surgery in 2005 after emergent stenting of the left main. This included a free RIMA graft to the OM, SVG to diagonal, and SVG to PDA. LIMA to LAD was intact from original surgery. He has had an old anterior myocardial infarction.  In April 2016 he was admitted with SBO and underwent surgery with exploratory lap and lysis of adhesions. His post op course was complicated by marked volume overload due to IVF with over 20 lb weight gain. He was diuresed. Echo showed EF 45%.  In August 2016 he presented with progressive claudication in the left hip and leg. Dopplers showed severe PAD. Angiography was done by Dr. Oneida Alar and showed severe left iliac and bilateral common femoral disease. He had an abnormal Myoview study and cardiac cath was done and showed patent grafts.  He underwent surgery by Dr. Oneida Alar with bilateral femoral endarterectomy and fem- fem BPG.   Admit 01/04-01/07/18 w/ URI, CHF, new dx atrial fib. Rate control w/ Coreg & dig. Bradycardia w/ Cardizem. Started on Eliquis and Plavix discontinued. TSH nl, EF 40-45% CHA2DS2VASc=6 (age x 2, HTN, DM, CAD, CHF). He was anticoagulated for 4 weeks and underwent DCCV on 05/05/16. Losartan was held for elevated creatinine. This later returned to normal and losartan resumed at 50 mg daily.   He was seen in March of this year with complaints of increased LE edema. This resolved with lasix. Subsequently he followed up with [name] PA-C on 06/25/2019 and was noted to be in atrial fibrillation with slow ventricular response.  His furosemide was increased to 40 mg Monday Wednesday Friday and he was instructed to take 20 mg on the other days his Lanoxin was stopped.  he underwent DCCV on  07/09/2019. Unfortunately he had early recurrence of afib. He was  seen by Dr Lovena Le in June. Discussed AAD therapy with Tikosyn versus amiodarone. Not felt to be a candidate for ablation due to age and multiple co morbidities. Patient decided to try amiodarone and was started on po dose 200 mg bid. He underwent DCCV on 12/05/19. On follow up with Dr Lovena Le in October he was still in NSR.   LE arterial dopplers in April showed noncompressible vessels. [facility]. Carotid dopplers showed 40-59% RICA stenosis. 4-09% on LICA.   On follow up today he is doing well. He hasn't been aware of any Afib.   No chest pain or dyspnea.  He denies any claudication.  His LE edema is stable. Unable to tolerate compression hose due to allergic reaction.  No dizziness or orthopnea. Complains that he bruises easily. Walks 20 minutes 3x/week.   Current Outpatient Medications on File Prior to Visit  Medication Sig Dispense Refill   ALPRAZolam (XANAX) 1 MG tablet Take 1 mg by mouth daily as needed for anxiety.     amiodarone (PACERONE) 200 MG tablet Take 1 tablet (200 mg total) by mouth daily. 90 tablet 3   furosemide (LASIX) 20 MG tablet TAKE 2 TABLETS (40 MG TOTAL) BY MOUTH DAILY. 40 MG MON, WED & FRI 20MG  ON ALL OTHER DAYS 360 tablet 1   glipiZIDE (GLUCOTROL) 5 MG tablet Take 5 mg by mouth daily.     iron polysaccharides (NIFEREX) 150 MG capsule Take 150 mg by mouth at bedtime.  losartan (COZAAR) 50 MG tablet Take 50 mg by mouth daily.     nitroGLYCERIN (NITROSTAT) 0.4 MG SL tablet Place 1 tablet (0.4 mg total) under the tongue every 5 (five) minutes as needed for chest pain. 25 tablet 2   pioglitazone (ACTOS) 45 MG tablet Take 45 mg by mouth daily.      potassium chloride (KLOR-CON) 10 MEQ tablet Take 1 tablet (10 mEq total) by mouth daily. 90 tablet 3   rosuvastatin (CRESTOR) 10 MG tablet Take 1 tablet (10 mg total) by mouth daily. 90 tablet 3   No current facility-administered medications on file prior to visit.    Allergies  Allergen Reactions    Hctz [Hydrochlorothiazide] Other (See Comments)    Hypokalemia     Past Medical History:  Diagnosis Date   Acute inferior myocardial infarction (HCC)    hx   Acute on chronic diastolic CHF (congestive heart failure), NYHA class 1 (HCC) 08/09/2014   Anemia    Atrial fibrillation (HCC)    BPH (benign prostatic hyperplasia)    CAD (coronary artery disease)    a. s/p CABG in 2005 with RIMA-OM, SVG-D1 and SVG-PDA b. 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"   Myocardial infarction ([place]) x2  1986, 2005   Osteoarthritis    IN FINGERS (03/31/2016)   PAD (peripheral artery disease) ([place])    Type II diabetes mellitus ([place])     Past Surgical History:  Procedure Laterality Date   CARDIAC CATHETERIZATION  12/23/2014   Procedure: Left Heart Cath and Cors/Grafts Angiography;  Surgeon: Brant Peets M Martinique, MD;  Location: [place] [facility];  Service: Cardiovascular;;   CARDIOVERSION N/A 05/05/2016   Procedure: CARDIOVERSION;  Surgeon: Lelon Perla, MD;  Location: [place];  Service: Cardiovascular;  Laterality: N/A;   CARDIOVERSION N/A 07/09/2019   Procedure: CARDIOVERSION;  Surgeon: Josue Hector, MD;  Location: [facility]COPY;  Service: Cardiovascular;  Laterality: N/A;   CARDIOVERSION N/A 12/05/2019   Procedure: CARDIOVERSION;  Surgeon: Satira Sark, MD;  [facility];  Service: Cardiovascular;  Laterality: N/A;   CATARACT EXTRACTION W/ INTRAOCULAR LENS  IMPLANT, BILATERAL Bilateral    CORONARY ANGIOPLASTY WITH [place]; redo 2005   ; free Rima to OM, svg-diag,svg-pda   ENDARTERECTOMY FEMORAL Bilateral 12/31/2014   Procedure: BILATERAL FEMORAL ENDARTERECTOMY;  Surgeon: Elam Dutch, MD;  Location: [place];  Service: Vascular;  Laterality: Bilateral;    FEMORAL-FEMORAL BYPASS GRAFT Bilateral 12/31/2014   Procedure: FEMORAL-FEMORAL ARTERY BYPASS GRAFT;  Surgeon: Elam Dutch, MD;  Location: [place];  Service: Vascular;  Laterality: Bilateral;   KYPHOPLASTY  02/29/2012   Procedure: KYPHOPLASTY;  Surgeon: Sinclair Ship, MD;  Location: [place];  Service: Orthopedics;  Laterality: Bilateral;  T 10 kyphoplasty   LAPAROTOMY N/A 08/02/2014   Procedure: EXPLORATORY LAPAROTOMY LYSIS OF ADHESIONS;  Surgeon: Donnie Mesa, MD;  Location: [place];  Service: General;  Laterality: N/A;   MELANOMA EXCISION     scalp   PERIPHERAL VASCULAR CATHETERIZATION N/A 12/05/2014   Procedure: Abdominal Aortogram;  Surgeon: Elam Dutch, MD;  Location: [place] [facility];  Service: Cardiovascular;  Laterality: N/A;   ROTATOR CUFF REPAIR Right    TONSILLECTOMY  Social History   Tobacco Use  Smoking Status Former   Packs/day: 2.00   Years: 25.00   Pack years: 50.00   Types: Cigarettes   Quit date: 02/22/1986   Years since quitting: 34.6  Smokeless Tobacco Never    Social History   Substance and Sexual Activity  Alcohol Use Yes   Alcohol/week: 6.0 standard drinks   Types: 6 Cans of beer per week    Family History  Problem Relation Age of Onset   Dementia Mother     Review of Systems: As noted in history of present illness.  All other systems were reviewed and are negative.  Physical Exam: BP 122/68   Pulse 66   Resp 18   Ht 5\' 9"  (1.753 m)   Wt 160 lb 12.8 oz (72.9 kg)   SpO2 97%   BMI 23.75 kg/m  GENERAL:  Well appearing WM in NAD HEENT:  PERRL, EOMI, sclera are clear. Oropharynx is clear. NECK:  No jugular venous distention, carotid upstroke brisk and symmetric, right carotid bruit, no thyromegaly or adenopathy LUNGS:  Clear to auscultation bilaterally CHEST:  Unremarkable HEART:  RRR,  PMI not displaced or sustained,S1 and S2 within normal limits, no S3, no S4: no clicks, no rubs, no murmurs ABD:  Soft, nontender. BS +, no  masses or bruits. No hepatomegaly, no splenomegaly EXT:  2 + pulses throughout, tr-1+ edema, no cyanosis no clubbing SKIN:  Warm and dry.  No rashes NEURO:  Alert and oriented x 3. Cranial nerves II through XII intact. PSYCH:  Cognitively intact   LABORATORY DATA: Lab Results  Component Value Date   WBC 4.9 12/03/2019   HGB 11.1 (L) 12/03/2019   HCT 32.6 (L) 12/03/2019   PLT 132 (L) 12/03/2019   GLUCOSE 105 (H) 12/03/2019   CHOL 105 04/02/2016   TRIG 54 04/02/2016   HDL 51 04/02/2016   LDLCALC 43 04/02/2016   ALT 15 (L) 12/25/2014   AST 24 12/25/2014   NA 136 12/03/2019   K 4.0 12/03/2019   CL 102 12/03/2019   CREATININE 1.52 (H) 12/03/2019   BUN 24 (H) 12/03/2019   CO2 22 12/03/2019   TSH 1.009 03/31/2016   INR 1.9 (H) 12/05/2019   HGBA1C 6.9 (H) 12/03/2019   Dated 12/09/16: cholesterol 135, triglycerides 48, HDL 59, LDL 66. A1c 6.6%. Hgb 12. CMET and TSH normal Dated 07/11/19: A1c 6.6%. BUN 23. Creatinine 1.17. otherwise CMET normal. Cholesterol 139, triglycerides 40, HDL 78, LDL 43. Dig level 0.4 Dated 02/10/20: A1c 7.3%.  Dated 08/05/20: A1c 6.5%  ECHO: 04/02/2016 - Left ventricle: The cavity size was mildly dilated. Wall   thickness was normal. Systolic function was mildly to moderately   reduced. The estimated ejection fraction was in the range of 40%   to 45%. There is akinesis of the inferolateral myocardium. - Mitral valve: Calcified annulus. There was mild regurgitation. - Aortic valve: Indexed valve area (VTI): 1.11 cm^2/m^2. Peak velocity ratio of LVOT to aortic valve: 0.71. Mean gradient (S): 5 mm Hg. Peak gradient (S): 9 mm Hg. - Left atrium: The atrium was moderately dilated. - Pulmonary arteries: Systolic pressure was moderately increased.   PA peak pressure: 51 mm Hg (S).  Impressions:  - Akinesis of the inferolateral wall with overall mild to moderate   LV dysfunction; mild MR; moderate LAE; mild TR; moderately   elevated pulmonary  pressure.   Assessment / Plan: 1. Coronary disease status post redo CABG in 2005. Remote anterior myocardial infarction.  Prior left main stent. Cardiac cath in September 2016 showed patent grafts. Continue medical therapy.   2. Chronic systolic/diastolic CHF EF 58-09%. He has stable class 2 symptoms. Clinically improved with restoration of NSR.  Continue current therapy with lasix, Coreg, and losartan. If symptoms worsen consider adding an SGLT 2 inhibitor.   3. Atrial fibrillation s/p DCCV on 05/05/16 and again this year with ERAD. Now on amiodarone. [place] Vasc score of 6. Will continue Eliquis long term. He underwent DCCV in September and has been maintaining NSR so far. Seen by Dr Lovena Le in May. Continue amiodarone 200 mg daily.  4. Hyperlipidemia on Zocor. Excellent control  5. Severe PAD- S/p bilateral femoral endarterectomy and fem-fem bypass. Stressed importance of regular aerobic walking. [facility] in October and LE arterial dopplers and carotid dopplers were stable.   6. DM type 2 on Actos and glimiperide. If CHF symptoms worsen would recommend stopping Actos and adding an SGLT 2 inhibitor.   7. HTN   He is due for lab work. Needs Chemistries including renal function, liver function and TSH. He would like to wait until he sees his PCP in November.   I will follow up in 6 months.

## 2020-10-02 ENCOUNTER — Other Ambulatory Visit: Payer: Self-pay

## 2020-10-02 ENCOUNTER — Ambulatory Visit (INDEPENDENT_AMBULATORY_CARE_PROVIDER_SITE_OTHER): Payer: Medicare Other | Admitting: Cardiology

## 2020-10-02 ENCOUNTER — Encounter: Payer: Self-pay | Admitting: Cardiology

## 2020-10-02 VITALS — BP 122/68 | HR 66 | Resp 18 | Ht 69.0 in | Wt 160.8 lb

## 2020-10-02 DIAGNOSIS — I739 Peripheral vascular disease, unspecified: Secondary | ICD-10-CM

## 2020-10-02 DIAGNOSIS — I25810 Atherosclerosis of coronary artery bypass graft(s) without angina pectoris: Secondary | ICD-10-CM

## 2020-10-02 DIAGNOSIS — I5042 Chronic combined systolic (congestive) and diastolic (congestive) heart failure: Secondary | ICD-10-CM | POA: Diagnosis not present

## 2020-10-02 DIAGNOSIS — I6523 Occlusion and stenosis of bilateral carotid arteries: Secondary | ICD-10-CM

## 2020-10-02 MED ORDER — APIXABAN 5 MG PO TABS: 5.0000 mg | ORAL_TABLET | Freq: Two times a day (BID) | ORAL | 3 refills | Status: DC

## 2020-10-19 ENCOUNTER — Telehealth: Payer: Self-pay | Admitting: Cardiology

## 2020-10-19 NOTE — Telephone Encounter (Signed)
Patient was contacted and made aware that we will be processing his copay.

## 2021-01-14 ENCOUNTER — Other Ambulatory Visit: Payer: Self-pay | Admitting: Student

## 2021-02-23 ENCOUNTER — Other Ambulatory Visit (HOSPITAL_COMMUNITY): Payer: Self-pay | Admitting: Surgery

## 2021-02-23 ENCOUNTER — Other Ambulatory Visit (HOSPITAL_COMMUNITY): Payer: Self-pay | Admitting: Family Medicine

## 2021-02-23 DIAGNOSIS — R059 Cough, unspecified: Secondary | ICD-10-CM

## 2021-05-18 NOTE — Progress Notes (Signed)
[name] Date of Birth: 11-May-1940 Medical Record #[id number]  History of Present Illness: [name] is seen for follow up Afib and CAD.  He has a history of coronary disease and is status post redo coronary bypass surgery in 2005 after emergent stenting of the left main. This included a free RIMA graft to the OM, SVG to diagonal, and SVG to PDA. LIMA to LAD was intact from original surgery. He has had an old anterior myocardial infarction.    In April 2016 he was admitted with SBO and underwent surgery with exploratory lap and lysis of adhesions. His post op course was complicated by marked volume overload due to IVF with over 20 lb weight gain. He was diuresed. Echo showed EF 45%.   In August 2016 he presented with progressive claudication in the left hip and leg. Dopplers showed severe PAD. Angiography was done by Dr. Oneida Alar and showed severe left iliac and bilateral common femoral disease. He had an abnormal Myoview study and cardiac cath was done and showed patent grafts.  He underwent surgery by Dr. Oneida Alar with bilateral femoral endarterectomy and fem- fem BPG.   Admit 01/04-01/07/18 w/ URI, CHF, new dx atrial fib. Rate control w/ Coreg & dig. Bradycardia w/ Cardizem. Started on Eliquis and Plavix discontinued. TSH nl, EF 40-45% CHA2DS2VASc=6 (age x 2, HTN, DM, CAD, CHF). He was anticoagulated for 4 weeks and underwent DCCV on 05/05/16.   Was seen on 06/25/2019 and was noted to be in atrial fibrillation with slow ventricular response.  His furosemide was increased to 40 mg Monday Wednesday Friday and he was instructed to take 20 mg on the other days his Lanoxin was stopped.  he underwent DCCV on  07/09/2019. Unfortunately he had early recurrence of afib. He was seen by Dr Lovena Le in June. Discussed AAD therapy with Tikosyn versus amiodarone. Not felt to be a candidate for ablation due to age and multiple co morbidities. Patient decided to try amiodarone and was started on po dose 200 mg bid.  He underwent DCCV on 12/05/19.   LE arterial dopplers in April 2022 showed noncompressible vessels. > 50% stenosis in femoral graft.  Carotid dopplers showed 40-59% RICA stenosis. 9-52% on LICA. Now followed by Dr Stanford Breed.  On follow up today he is doing well. He hasn't been aware of any Afib.   No chest pain or dyspnea.  He denies any claudication. His activity level has been less this winter.  His LE edema is stable. Unable to tolerate compression hose due to allergic reaction.  No dizziness or orthopnea. Complains that he bruises easily.   Current Outpatient Medications on File Prior to Visit  Medication Sig Dispense Refill   ALPRAZolam (XANAX) 1 MG tablet Take 1 mg by mouth daily as needed for anxiety.     apixaban (ELIQUIS) 5 MG TABS tablet Take 1 tablet (5 mg total) by mouth 2 (two) times daily. LABS NEEDED FOR FURTHER REFILLS 180 tablet 3   furosemide (LASIX) 20 MG tablet TAKE 2 TABLETS (40 MG TOTAL) BY MOUTH DAILY. 40 MG MON, WED & FRI 20MG  ON ALL OTHER DAYS 360 tablet 1   glimepiride (AMARYL) 4 MG tablet Take 4 mg by mouth daily.     iron polysaccharides (NIFEREX) 150 MG capsule Take 150 mg by mouth at bedtime.      losartan (COZAAR) 50 MG tablet Take 50 mg by mouth daily.     nitroGLYCERIN (NITROSTAT) 0.4 MG SL tablet Place 1 tablet (0.4 mg  total) under the tongue every 5 (five) minutes as needed for chest pain. 25 tablet 2   pioglitazone (ACTOS) 45 MG tablet Take 45 mg by mouth daily.      potassium chloride (KLOR-CON) 10 MEQ tablet TAKE 1 TABLET BY MOUTH EVERY DAY 90 tablet 3   rosuvastatin (CRESTOR) 10 MG tablet Take 1 tablet (10 mg total) by mouth daily. 90 tablet 3   No current facility-administered medications on file prior to visit.    Allergies  Allergen Reactions   Hctz [Hydrochlorothiazide] Other (See Comments)    Hypokalemia    Glipizide     Other reaction(s): Weak    Past Medical History:  Diagnosis Date   Acute inferior myocardial infarction (HCC)    hx   Acute  on chronic diastolic CHF (congestive heart failure), NYHA class 1 (HCC) 08/09/2014   Anemia    Atrial fibrillation (HCC)    BPH (benign prostatic hyperplasia)    CAD (coronary artery disease)    a. s/p CABG in 2005 with RIMA-OM, SVG-D1 and SVG-PDA b. 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"   Myocardial infarction ([place]) x2  1986, 2005   Osteoarthritis    IN FINGERS (03/31/2016)   PAD (peripheral artery disease) ([place])    Type II diabetes mellitus ([place])     Past Surgical History:  Procedure Laterality Date   CARDIAC CATHETERIZATION  12/23/2014   Procedure: Left Heart Cath and Cors/Grafts Angiography;  Surgeon: Shaquana Buel M Martinique, MD;  Location: [place] [facility];  Service: Cardiovascular;;   CARDIOVERSION N/A 05/05/2016   Procedure: CARDIOVERSION;  Surgeon: Lelon Perla, MD;  Location: [place];  Service: Cardiovascular;  Laterality: N/A;   CARDIOVERSION N/A 07/09/2019   Procedure: CARDIOVERSION;  Surgeon: Josue Hector, MD;  Location: [facility] ENDOSCOPY;  Service: Cardiovascular;  Laterality: N/A;   CARDIOVERSION N/A 12/05/2019   Procedure: CARDIOVERSION;  Surgeon: Satira Sark, MD;  [facility];  Service: Cardiovascular;  Laterality: N/A;   CATARACT EXTRACTION W/ INTRAOCULAR LENS  IMPLANT, BILATERAL Bilateral    CORONARY ANGIOPLASTY WITH [place]; redo 2005   ; free Rima to OM, svg-diag,svg-pda   ENDARTERECTOMY FEMORAL Bilateral 12/31/2014   Procedure: BILATERAL FEMORAL ENDARTERECTOMY;  Surgeon: Elam Dutch, MD;  Location: [place];  Service: Vascular;  Laterality: Bilateral;   FEMORAL-FEMORAL BYPASS GRAFT Bilateral 12/31/2014   Procedure: FEMORAL-FEMORAL ARTERY BYPASS GRAFT;  Surgeon: Elam Dutch, MD;  Location: [place];  Service: Vascular;  Laterality:  Bilateral;   KYPHOPLASTY  02/29/2012   Procedure: KYPHOPLASTY;  Surgeon: Sinclair Ship, MD;  Location: [place];  Service: Orthopedics;  Laterality: Bilateral;  T 10 kyphoplasty   LAPAROTOMY N/A 08/02/2014   Procedure: EXPLORATORY LAPAROTOMY LYSIS OF ADHESIONS;  Surgeon: Donnie Mesa, MD;  Location: [place];  Service: General;  Laterality: N/A;   MELANOMA EXCISION     scalp   PERIPHERAL VASCULAR CATHETERIZATION N/A 12/05/2014   Procedure: Abdominal Aortogram;  Surgeon: Elam Dutch, MD;  Location: [place] [facility];  Service: Cardiovascular;  Laterality: N/A;   ROTATOR CUFF REPAIR Right    TONSILLECTOMY      Social History   Tobacco Use  Smoking Status Former   Packs/day: 2.00  Years: 25.00   Pack years: 50.00   Types: Cigarettes   Quit date: 02/22/1986   Years since quitting: 35.2  Smokeless Tobacco Never    Social History   Substance and Sexual Activity  Alcohol Use Yes   Alcohol/week: 6.0 standard drinks   Types: 6 Cans of beer per week    Family History  Problem Relation Age of Onset   Dementia Mother     Review of Systems: As noted in history of present illness.  All other systems were reviewed and are negative.  Physical Exam: BP 130/70    Pulse (!) 55    Ht 5\' 9"  (1.753 m)    Wt 157 lb 6.4 oz (71.4 kg)    BMI 23.24 kg/m  GENERAL:  Well appearing WM in NAD HEENT:  PERRL, EOMI, sclera are clear. Oropharynx is clear. NECK:  No jugular venous distention, carotid upstroke brisk and symmetric, right carotid bruit, no thyromegaly or adenopathy LUNGS:  Clear to auscultation bilaterally CHEST:  Unremarkable HEART:  RRR,  PMI not displaced or sustained,S1 and S2 within normal limits, no S3, no S4: no clicks, no rubs, no murmurs ABD:  Soft, nontender. BS +, no masses or bruits. No hepatomegaly, no splenomegaly EXT:  2 + pulses throughout, tr-1+ edema, no cyanosis no clubbing SKIN:  Warm and dry.  No rashes NEURO:  Alert and oriented x 3. Cranial nerves II  through XII intact. PSYCH:  Cognitively intact   LABORATORY DATA: Lab Results  Component Value Date   WBC 4.9 12/03/2019   HGB 11.1 (L) 12/03/2019   HCT 32.6 (L) 12/03/2019   PLT 132 (L) 12/03/2019   GLUCOSE 105 (H) 12/03/2019   CHOL 105 04/02/2016   TRIG 54 04/02/2016   HDL 51 04/02/2016   LDLCALC 43 04/02/2016   ALT 15 (L) 12/25/2014   AST 24 12/25/2014   NA 136 12/03/2019   K 4.0 12/03/2019   CL 102 12/03/2019   CREATININE 1.52 (H) 12/03/2019   BUN 24 (H) 12/03/2019   CO2 22 12/03/2019   TSH 1.009 03/31/2016   INR 1.9 (H) 12/05/2019   HGBA1C 6.9 (H) 12/03/2019   Dated 12/09/16: cholesterol 135, triglycerides 48, HDL 59, LDL 66. A1c 6.6%. Hgb 12. CMET and TSH normal Dated 07/11/19: A1c 6.6%. BUN 23. Creatinine 1.17. otherwise CMET normal. Cholesterol 139, triglycerides 40, HDL 78, LDL 43. Dig level 0.4 Dated 02/10/20: A1c 7.3%.  Dated 08/05/20: A1c 6.5% Dated 02/17/21: cholesterol 148, triglycerides 53, HDL 90, LDL 47. Creatinine 1.64. otherwise CMET and CBC normal. A1c 6.9%  Ecg today shows Afib rate 55. Nonspecific TWA. I have personally reviewed and interpreted this study.   ECHO: 04/02/2016 - Left ventricle: The cavity size was mildly dilated. Wall   thickness was normal. Systolic function was mildly to moderately   reduced. The estimated ejection fraction was in the range of 40%   to 45%. There is akinesis of the inferolateral myocardium. - Mitral valve: Calcified annulus. There was mild regurgitation. - Aortic valve: Indexed valve area (VTI): 1.11 cm^2/m^2. Peak velocity ratio of LVOT to aortic valve: 0.71. Mean gradient (S): 5 mm Hg. Peak gradient (S): 9 mm Hg. - Left atrium: The atrium was moderately dilated. - Pulmonary arteries: Systolic pressure was moderately increased.   PA peak pressure: 51 mm Hg (S).  Impressions:  - Akinesis of the inferolateral wall with overall mild to moderate   LV dysfunction; mild MR; moderate LAE; mild TR; moderately   elevated  pulmonary pressure.  Assessment / Plan: 1. Coronary disease status post redo CABG in 2005. Remote anterior myocardial infarction. Prior left main stent. Cardiac cath in September 2016 showed patent grafts. Continue medical therapy. No significant anginal symptoms.  2. Chronic systolic/diastolic CHF EF 76-16%. He has stable class 2 symptoms. Continue current therapy with lasix, Coreg, and losartan. I would like to get him on an SGLT 2 inhibitor. The patient would like for me to discuss with Dr Harrington Challenger.   3. Atrial fibrillation s/p DCCV on 05/05/16 and again in 2021 with ERAD. Now on amiodarone with recurrent Afib today. Rate is well controlled. He is asymptomatic. [place] Vasc score of 6. Will continue Eliquis long term. Recommend stopping amiodarone at this point. Will continue with rate control strategy only.   4. Hyperlipidemia on Zocor. Excellent control  5. Severe PAD- S/p bilateral femoral endarterectomy and fem-fem bypass. Stressed importance of regular aerobic walking. [facility] by [facility] in April 2022 and LE arterial dopplers and carotid dopplers were stable.   6. DM type 2 on Actos and glimiperide. I would really like to stop the Actos and switch to a SGLT 2 inhibitor given CHF. Will send recommendation to Dr Harrington Challenger.   7. HTN   8. CKD stage 3b  I will follow up in 6 months.

## 2021-05-25 ENCOUNTER — Ambulatory Visit: Payer: Medicare Other | Admitting: Cardiology

## 2021-05-25 ENCOUNTER — Other Ambulatory Visit: Payer: Self-pay

## 2021-05-25 ENCOUNTER — Encounter: Payer: Self-pay | Admitting: Cardiology

## 2021-05-25 VITALS — BP 130/70 | HR 55 | Ht 69.0 in | Wt 157.4 lb

## 2021-05-25 DIAGNOSIS — E785 Hyperlipidemia, unspecified: Secondary | ICD-10-CM | POA: Diagnosis not present

## 2021-05-25 DIAGNOSIS — I5042 Chronic combined systolic (congestive) and diastolic (congestive) heart failure: Secondary | ICD-10-CM

## 2021-05-25 DIAGNOSIS — I25810 Atherosclerosis of coronary artery bypass graft(s) without angina pectoris: Secondary | ICD-10-CM

## 2021-05-25 DIAGNOSIS — I4819 Other persistent atrial fibrillation: Secondary | ICD-10-CM | POA: Diagnosis not present

## 2021-05-25 DIAGNOSIS — I739 Peripheral vascular disease, unspecified: Secondary | ICD-10-CM

## 2021-06-03 ENCOUNTER — Telehealth: Payer: Self-pay | Admitting: Cardiology

## 2021-06-03 NOTE — Telephone Encounter (Signed)
Patient called stating he was recently prescribed Jardiance, he states he can't afford it as it will cost him $47 a month and he is on a fixed income.  He is wondering if he could be put back on Actos the medication he was taking before.   ?

## 2021-06-03 NOTE — Telephone Encounter (Signed)
Patient stated he cannot afford Jardiance and returned it to his pharmacy. He is staying on actos. I explained the purpose of Jardiance to him and offered to send him an application for assistance. He has 32-monthsupply of actos. During the conversation, he siad he has a dark blotch on his left arm below the elbow. He believes his blood vessel "burst". He stated he has to be careful not to bang his arms or have any injuries because of taking eliquis. He denies having dark stools or bleeding with brushing teeth. Patient would like to have a return envelop to [place]. ?

## 2021-06-04 NOTE — Telephone Encounter (Signed)
Yes he should fill out patient assistance forms to see if we can get Jardiance as this is better for his heart and diabetes. ?OK to continue Actos for now. ? ?Burgandy Hackworth Martinique MD, [facility] ? ?

## 2021-06-25 MED ORDER — EMPAGLIFLOZIN 10 MG PO TABS: 10.0000 mg | ORAL_TABLET | Freq: Every day | ORAL | 3 refills | Status: DC

## 2021-07-06 NOTE — Telephone Encounter (Signed)
Called patient left message on personal voice mail I was calling to follow up to see if you received patient assistance forms for Jardiance.Advised to call me back. ?

## 2021-07-15 ENCOUNTER — Other Ambulatory Visit: Payer: Self-pay

## 2021-07-15 DIAGNOSIS — I739 Peripheral vascular disease, unspecified: Secondary | ICD-10-CM

## 2021-07-15 DIAGNOSIS — I6523 Occlusion and stenosis of bilateral carotid arteries: Secondary | ICD-10-CM

## 2021-07-19 NOTE — Progress Notes (Signed)
[facility] OF [place] ? ?ASSESSMENT / PLAN: ?[name] is a 81 y.o. male with atherosclerosis of native arteries of bilateral lower extremities status post bilateral common femoral artery endarterectomies and right-toleft femoral-femoral bypass with dacron 01/10/15. He is currently asymptomatic. He has asymptomatic mild bilateral carotid artery stenosis.  ? ?Patient counseled patients with asymptomatic peripheral arterial disease or claudication have a 1-2% risk of developing chronic limb threatening ischemia, but a 15-30% risk of mortality in the next 5 years. Intervention should only be considered for medically optimized patients with disabling symptoms. . ? ?Recommend the following which can slow the progression of atherosclerosis and reduce the risk of major adverse cardiac / limb events:  ?Complete cessation from all tobacco products. ?Blood glucose control with goal A1c < 7%. ?Blood pressure control with goal blood pressure < 140/90 mmHg. ?Lipid reduction therapy with goal LDL-C <100 mg/dL (<70 if symptomatic from PAD).  ?Aspirin '81mg'$  PO QD.  ?Atorvastatin 40-'80mg'$  PO QD (or other "high intensity" statin therapy). ? ?Duplex today shows possible inflow stenosis in R->L fem-fem bypass. Plan CT angiogram to plan intervention. Will call patient with results.  ?  ?CHIEF COMPLAINT: surveillance of bypass ? ?HISTORY OF PRESENT ILLNESS: ?[name] is a 81 y.o. male with known history of peripheral arterial disease.  He is status post bilateral common femoral endarterectomies and right left femoral-femoral bypass with Dacron 01/10/2015 by Dr. Oneida Alar.  He has done very well since this.  He can ambulate for 20 minutes at a time.  He does have some difficulty with instability.  He does not report claudication per se.  He denies ischemic rest pain.  He denies ischemic ulceration. ? ?07/20/21: Patient returns to clinic.  No new symptoms to report.  He is ambulating, but continues to have some  difficulty with instability.  No ulcers or ischemic rest pain. ? ?Past Medical History:  ?Diagnosis Date  ? Acute inferior myocardial infarction [facility])   ? hx  ? Acute on chronic diastolic CHF (congestive heart failure), NYHA class 1 ([place]) 08/09/2014  ? Anemia   ? Atrial fibrillation ([place])   ? BPH (benign prostatic hyperplasia)   ? CAD (coronary artery disease)   ? a. s/p CABG in 2005 with RIMA-OM, SVG-D1 and SVG-PDA b. cath in 2016 showing patent grafts  ? Dyslipidemia   ? History of blood transfusion   ? "I've had one; don't remember when"  ? HTN (hypertension)   ? Hyponatremia   ? 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 "scalp"  ? Myocardial infarction [facility]) x2  1986, 2005  ? Osteoarthritis   ? IN FINGERS (03/31/2016)  ? PAD (peripheral artery disease) ([place])   ? Type II diabetes mellitus ([place])   ? ? ?Past Surgical History:  ?Procedure Laterality Date  ? CARDIAC CATHETERIZATION  12/23/2014  ? Procedure: Left Heart Cath and Cors/Grafts Angiography;  Surgeon: Peter M Martinique, MD;  Location: [place] [facility];  Service: Cardiovascular;;  ? CARDIOVERSION N/A 05/05/2016  ? Procedure: CARDIOVERSION;  Surgeon: Lelon Perla, MD;  Location: [place];  Service: Cardiovascular;  Laterality: N/A;  ? CARDIOVERSION N/A 07/09/2019  ? Procedure: CARDIOVERSION;  Surgeon: Josue Hector, MD;  Location: [facility] ENDOSCOPY;  Service: Cardiovascular;  Laterality: N/A;  ? CARDIOVERSION N/A 12/05/2019  ? Procedure: CARDIOVERSION;  Surgeon: Satira Sark, MD;  [facility];  Service: Cardiovascular;  Laterality: N/A;  ? CATARACT EXTRACTION W/ INTRAOCULAR LENS  IMPLANT, BILATERAL Bilateral   ?  CORONARY ANGIOPLASTY WITH STENT PLACEMENT    ? CORONARY ARTERY BYPASS GRAFT  1986; redo 2005  ? ; free Rima to OM, svg-diag,svg-pda  ? ENDARTERECTOMY FEMORAL Bilateral 12/31/2014  ? Procedure: BILATERAL FEMORAL ENDARTERECTOMY;  Surgeon: Elam Dutch, MD;  Location: [place];  Service: Vascular;  Laterality: Bilateral;  ? FEMORAL-FEMORAL  BYPASS GRAFT Bilateral 12/31/2014  ? Procedure: FEMORAL-FEMORAL ARTERY BYPASS GRAFT;  Surgeon: Elam Dutch, MD;  Location: [place];  Service: Vascular;  Laterality: Bilateral;  ? KYPHOPLASTY  02/29/2012  ? Procedure: KYPHOPLASTY;  Surgeon: Sinclair Ship, MD;  Location: [place];  Service: Orthopedics;  Laterality: Bilateral;  T 10 kyphoplasty  ? LAPAROTOMY N/A 08/02/2014  ? Procedure: EXPLORATORY LAPAROTOMY LYSIS OF ADHESIONS;  Surgeon: Donnie Mesa, MD;  Location: [place];  Service: General;  Laterality: N/A;  ? MELANOMA EXCISION    ? scalp  ? PERIPHERAL VASCULAR CATHETERIZATION N/A 12/05/2014  ? Procedure: Abdominal Aortogram;  Surgeon: Elam Dutch, MD;  Location: [place] [facility];  Service: Cardiovascular;  Laterality: N/A;  ? ROTATOR CUFF REPAIR Right   ? TONSILLECTOMY    ? ? ?Family History  ?Problem Relation Age of Onset  ? Dementia Mother   ? ? ?Social History  ? ?Socioeconomic History  ? Marital status: Married  ?  Spouse name: Not on file  ? Number of children: 2  ? Years of education: Not on file  ? Highest education level: Not on file  ?Occupational History  ? Occupation: insurance  ?  Employer: RETIRED  ?Tobacco Use  ? Smoking status: Former  ?  Packs/day: 2.00  ?  Years: 25.00  ?  Pack years: 50.00  ?  Types: Cigarettes  ?  Quit date: 02/22/1986  ?  Years since quitting: 35.4  ? Smokeless tobacco: Never  ?Vaping Use  ? Vaping Use: Never used  ?Substance and Sexual Activity  ? Alcohol use: Yes  ?  Alcohol/week: 6.0 standard drinks  ?  Types: 6 Cans of beer per week  ? Drug use: No  ? Sexual activity: Not Currently  ?Other Topics Concern  ? Not on file  ?Social History Narrative  ? Not on file  ? ?Social Determinants of Health  ? ?Financial Resource Strain: Not on file  ?Food Insecurity: Not on file  ?Transportation Needs: Not on file  ?Physical Activity: Not on file  ?Stress: Not on file  ?Social Connections: Not on file  ?Intimate Partner Violence: Not on file  ? ? ?Allergies  ?Allergen  Reactions  ? Hctz [Hydrochlorothiazide] Other (See Comments)  ?  Hypokalemia ?  ? Glipizide   ?  Other reaction(s): Weak  ? ? ?Current Outpatient Medications  ?Medication Sig Dispense Refill  ? ALPRAZolam (XANAX) 1 MG tablet Take 1 mg by mouth daily as needed for anxiety.    ? apixaban (ELIQUIS) 5 MG TABS tablet Take 1 tablet (5 mg total) by mouth 2 (two) times daily. LABS NEEDED FOR FURTHER REFILLS 180 tablet 3  ? furosemide (LASIX) 20 MG tablet TAKE 2 TABLETS (40 MG TOTAL) BY MOUTH DAILY. 40 MG MON, WED & FRI '20MG'$  ON ALL OTHER DAYS 360 tablet 1  ? glimepiride (AMARYL) 4 MG tablet Take 4 mg by mouth daily.    ? iron polysaccharides (NIFEREX) 150 MG capsule Take 150 mg by mouth at bedtime.     ? losartan (COZAAR) 50 MG tablet Take 50 mg by mouth daily.    ? nitroGLYCERIN (NITROSTAT) 0.4 MG SL tablet  Place 1 tablet (0.4 mg total) under the tongue every 5 (five) minutes as needed for chest pain. 25 tablet 2  ? pioglitazone (ACTOS) 45 MG tablet Take 45 mg by mouth daily.     ? potassium chloride (KLOR-CON) 10 MEQ tablet TAKE 1 TABLET BY MOUTH EVERY DAY 90 tablet 3  ? empagliflozin (JARDIANCE) 10 MG TABS tablet Take 1 tablet (10 mg total) by mouth daily before breakfast. (Patient not taking: Reported on 07/20/2021) 90 tablet 3  ? rosuvastatin (CRESTOR) 10 MG tablet Take 1 tablet (10 mg total) by mouth daily. 90 tablet 3  ? ?No current facility-administered medications for this visit.  ? ? ?REVIEW OF SYSTEMS:  ?'[X]'$  denotes positive finding, '[ ]'$  denotes negative finding ?Cardiac  Comments:  ?Chest pain or chest pressure:    ?Shortness of breath upon exertion:    ?Short of breath when lying flat:    ?Irregular heart rhythm:    ?    ?Vascular    ?Pain in calf, thigh, or hip brought on by ambulation: x Pain in left hip with walking  ?Pain in feet at night that wakes you up from your sleep:     ?Blood clot in your veins:    ?Leg swelling:     ?    ?Pulmonary    ?Oxygen at home:    ?Productive cough:     ?Wheezing:     ?     ?Neurologic    ?Sudden weakness in arms or legs:  x Left leg feels "unsteady"  ?Sudden numbness in arms or legs:     ?Sudden onset of difficulty speaking or slurred speech:    ?Temporary loss of vision in one eye

## 2021-07-20 ENCOUNTER — Ambulatory Visit (HOSPITAL_COMMUNITY)
Admission: RE | Admit: 2021-07-20 | Discharge: 2021-07-20 | Disposition: A | Discharge status: Home / Self Care | Payer: Medicare Other | Source: Ambulatory Visit | Attending: Vascular Surgery | Admitting: Vascular Surgery

## 2021-07-20 ENCOUNTER — Ambulatory Visit: Payer: Medicare Other | Admitting: Vascular Surgery

## 2021-07-20 ENCOUNTER — Ambulatory Visit (INDEPENDENT_AMBULATORY_CARE_PROVIDER_SITE_OTHER)
Admission: RE | Admit: 2021-07-20 | Discharge: 2021-07-20 | Disposition: A | Discharge status: Home / Self Care | Payer: Medicare Other | Source: Ambulatory Visit | Attending: Vascular Surgery | Admitting: Vascular Surgery

## 2021-07-20 ENCOUNTER — Encounter: Payer: Self-pay | Admitting: Vascular Surgery

## 2021-07-20 ENCOUNTER — Other Ambulatory Visit: Payer: Self-pay

## 2021-07-20 VITALS — BP 140/58 | HR 55 | Temp 97.9°F | Resp 20 | Ht 69.0 in | Wt 153.0 lb

## 2021-07-20 DIAGNOSIS — I6523 Occlusion and stenosis of bilateral carotid arteries: Secondary | ICD-10-CM

## 2021-07-20 DIAGNOSIS — I739 Peripheral vascular disease, unspecified: Secondary | ICD-10-CM

## 2021-07-20 NOTE — Telephone Encounter (Signed)
Patient never returned call  

## 2021-07-23 ENCOUNTER — Other Ambulatory Visit: Payer: Self-pay

## 2021-07-23 DIAGNOSIS — I739 Peripheral vascular disease, unspecified: Secondary | ICD-10-CM

## 2021-07-30 ENCOUNTER — Ambulatory Visit (HOSPITAL_COMMUNITY)
Admission: RE | Admit: 2021-07-30 | Discharge: 2021-07-30 | Disposition: A | Discharge status: Home / Self Care | Payer: Medicare Other | Source: Ambulatory Visit | Attending: Vascular Surgery | Admitting: Vascular Surgery

## 2021-07-30 DIAGNOSIS — I739 Peripheral vascular disease, unspecified: Secondary | ICD-10-CM | POA: Insufficient documentation

## 2021-07-30 DIAGNOSIS — S22070A Wedge compression fracture of T9-T10 vertebra, initial encounter for closed fracture: Secondary | ICD-10-CM | POA: Diagnosis not present

## 2021-07-30 DIAGNOSIS — R531 Weakness: Secondary | ICD-10-CM | POA: Diagnosis not present

## 2021-07-30 DIAGNOSIS — K8020 Calculus of gallbladder without cholecystitis without obstruction: Secondary | ICD-10-CM | POA: Diagnosis not present

## 2021-07-30 DIAGNOSIS — I745 Embolism and thrombosis of iliac artery: Secondary | ICD-10-CM | POA: Diagnosis not present

## 2021-07-30 LAB — POCT I-STAT CREATININE: Creatinine, Ser: 1.7 mg/dL — ABNORMAL HIGH (ref 0.61–1.24)

## 2021-07-30 MED ORDER — IOHEXOL 350 MG/ML SOLN
125.0000 mL | Freq: Once | INTRAVENOUS | Status: AC | PRN
  Administered 2021-07-30: 100 mL via INTRAVENOUS

## 2021-08-04 ENCOUNTER — Ambulatory Visit: Payer: Medicare Other | Admitting: Internal Medicine

## 2021-08-04 ENCOUNTER — Encounter: Payer: Self-pay | Admitting: Internal Medicine

## 2021-08-04 VITALS — BP 100/56 | HR 63 | Ht 69.0 in | Wt 158.8 lb

## 2021-08-04 DIAGNOSIS — I480 Paroxysmal atrial fibrillation: Secondary | ICD-10-CM | POA: Diagnosis not present

## 2021-08-04 MED ORDER — APIXABAN 2.5 MG PO TABS: 2.5000 mg | ORAL_TABLET | Freq: Two times a day (BID) | ORAL | 11 refills | Status: DC

## 2021-08-04 NOTE — Patient Instructions (Signed)
Medication Instructions:  ?Your physician has recommended you make the following change in your medication:  ? ?Decrease Eliquis to 2.5 mg Two Times Daily  ? ?*If you need a refill on your cardiac medications before your next appointment, please call your pharmacy* ? ? ?Lab Work: ?NONE  ? ?If you have labs (blood work) drawn today and your tests are completely normal, you will receive your results only by: ?MyChart Message (if you have MyChart) OR ?A paper copy in the mail ?If you have any lab test that is abnormal or we need to change your treatment, we will call you to review the results. ? ? ?Testing/Procedures: ?NONE  ? ? ?Follow-Up: ?At [facility], you and your health needs are our priority.  As part of our continuing mission to provide you with exceptional heart care, we have created designated Provider Care Teams.  These Care Teams include your primary Cardiologist (physician) and Advanced Practice Providers (APPs -  Physician Assistants and Nurse Practitioners) who all work together to provide you with the care you need, when you need it. ? ?We recommend signing up for the patient portal called "MyChart".  Sign up information is provided on this After Visit Summary.  MyChart is used to connect with patients for Virtual Visits (Telemedicine).  Patients are able to view lab/test results, encounter notes, upcoming appointments, etc.  Non-urgent messages can be sent to your provider as well.   ?To learn more about what you can do with MyChart, go to [email or web address].   ? ?Your next appointment:   ?1 year(s) ? ?The format for your next appointment:   ?In Person ? ?Provider:   ?Cristopher Peru, MD  ? ? ?Other Instructions ?Thank you for choosing [facility]! ?  ? ?Important Information About Sugar ? ? ? ? ?  ?

## 2021-08-04 NOTE — Progress Notes (Signed)
? ? ? ? ?HPI ?Mr. Jilek returns today for followup. He is a pleasant 81 yo man with persistent atrial fib, s/p DCCV, chronic amiodarone therapy, peripheral vascular disease, and HTN. No palpitations. No cough. No edema.He was cardioverted over a year ago. In the interim he notes that he feels no different. His amio was stopped by Dr. Martinique. He has had some easy bruising.  ?Allergies  ?Allergen Reactions  ? Hctz [Hydrochlorothiazide] Other (See Comments)  ?  Hypokalemia ?  ? Glipizide   ?  Other reaction(s): Weak  ? ? ? ?Current Outpatient Medications  ?Medication Sig Dispense Refill  ? ALPRAZolam (XANAX) 1 MG tablet Take 1 mg by mouth daily as needed for anxiety.    ? apixaban (ELIQUIS) 5 MG TABS tablet Take 1 tablet (5 mg total) by mouth 2 (two) times daily. LABS NEEDED FOR FURTHER REFILLS 180 tablet 3  ? empagliflozin (JARDIANCE) 10 MG TABS tablet Take 1 tablet (10 mg total) by mouth daily before breakfast. (Patient not taking: Reported on 07/20/2021) 90 tablet 3  ? furosemide (LASIX) 20 MG tablet TAKE 2 TABLETS (40 MG TOTAL) BY MOUTH DAILY. 40 MG MON, WED & FRI '20MG'$  ON ALL OTHER DAYS 360 tablet 1  ? glimepiride (AMARYL) 4 MG tablet Take 4 mg by mouth daily.    ? iron polysaccharides (NIFEREX) 150 MG capsule Take 150 mg by mouth at bedtime.     ? losartan (COZAAR) 50 MG tablet Take 50 mg by mouth daily.    ? nitroGLYCERIN (NITROSTAT) 0.4 MG SL tablet Place 1 tablet (0.4 mg total) under the tongue every 5 (five) minutes as needed for chest pain. 25 tablet 2  ? pioglitazone (ACTOS) 45 MG tablet Take 45 mg by mouth daily.     ? potassium chloride (KLOR-CON) 10 MEQ tablet TAKE 1 TABLET BY MOUTH EVERY DAY 90 tablet 3  ? rosuvastatin (CRESTOR) 10 MG tablet Take 1 tablet (10 mg total) by mouth daily. 90 tablet 3  ? ?No current facility-administered medications for this visit.  ? ? ? ?Past Medical History:  ?Diagnosis Date  ? Acute inferior myocardial infarction [facility])   ? hx  ? Acute on chronic diastolic CHF (congestive  heart failure), NYHA class 1 ([place]) 08/09/2014  ? Anemia   ? Atrial fibrillation ([place])   ? BPH (benign prostatic hyperplasia)   ? CAD (coronary artery disease)   ? a. s/p CABG in 2005 with RIMA-OM, SVG-D1 and SVG-PDA b. cath in 2016 showing patent grafts  ? Dyslipidemia   ? History of blood transfusion   ? "I've had one; don't remember when"  ? HTN (hypertension)   ? Hyponatremia   ? 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 "scalp"  ? Myocardial infarction [facility]) x2  1986, 2005  ? Osteoarthritis   ? IN FINGERS (03/31/2016)  ? PAD (peripheral artery disease) ([place])   ? Type II diabetes mellitus ([place])   ? ? ?ROS: ? ? All systems reviewed and negative except as noted in the HPI. ? ? ?Past Surgical History:  ?Procedure Laterality Date  ? CARDIAC CATHETERIZATION  12/23/2014  ? Procedure: Left Heart Cath and Cors/Grafts Angiography;  Surgeon: Peter M Martinique, MD;  Location: [place] [facility];  Service: Cardiovascular;;  ? CARDIOVERSION N/A 05/05/2016  ? Procedure: CARDIOVERSION;  Surgeon: Lelon Perla, MD;  Location: [place];  Service: Cardiovascular;  Laterality: N/A;  ? CARDIOVERSION N/A 07/09/2019  ? Procedure: CARDIOVERSION;  Surgeon: Josue Hector, MD;  [facility] ENDOSCOPY;  Service: Cardiovascular;  Laterality: N/A;  ? CARDIOVERSION N/A 12/05/2019  ? Procedure: CARDIOVERSION;  Surgeon: Satira Sark, MD;  [facility];  Service: Cardiovascular;  Laterality: N/A;  ? CATARACT EXTRACTION W/ INTRAOCULAR LENS  IMPLANT, BILATERAL Bilateral   ? CORONARY ANGIOPLASTY WITH STENT PLACEMENT    ? CORONARY ARTERY BYPASS GRAFT  1986; redo 2005  ? ; free Rima to OM, svg-diag,svg-pda  ? ENDARTERECTOMY FEMORAL Bilateral 12/31/2014  ? Procedure: BILATERAL FEMORAL ENDARTERECTOMY;  Surgeon: Elam Dutch, MD;  Location: [place];  Service: Vascular;  Laterality: Bilateral;  ? FEMORAL-FEMORAL BYPASS GRAFT Bilateral 12/31/2014  ? Procedure: FEMORAL-FEMORAL ARTERY BYPASS GRAFT;  Surgeon: Elam Dutch, MD;  Location:  [place];  Service: Vascular;  Laterality: Bilateral;  ? KYPHOPLASTY  02/29/2012  ? Procedure: KYPHOPLASTY;  Surgeon: Sinclair Ship, MD;  Location: [place];  Service: Orthopedics;  Laterality: Bilateral;  T 10 kyphoplasty  ? LAPAROTOMY N/A 08/02/2014  ? Procedure: EXPLORATORY LAPAROTOMY LYSIS OF ADHESIONS;  Surgeon: Donnie Mesa, MD;  Location: [place];  Service: General;  Laterality: N/A;  ? MELANOMA EXCISION    ? scalp  ? PERIPHERAL VASCULAR CATHETERIZATION N/A 12/05/2014  ? Procedure: Abdominal Aortogram;  Surgeon: Elam Dutch, MD;  Location: [place] [facility];  Service: Cardiovascular;  Laterality: N/A;  ? ROTATOR CUFF REPAIR Right   ? TONSILLECTOMY    ? ? ? ?Family History  ?Problem Relation Age of Onset  ? Dementia Mother   ? ? ? ?Social History  ? ?Socioeconomic History  ? Marital status: Married  ?  Spouse name: Not on file  ? Number of children: 2  ? Years of education: Not on file  ? Highest education level: Not on file  ?Occupational History  ? Occupation: insurance  ?  Employer: RETIRED  ?Tobacco Use  ? Smoking status: Former  ?  Packs/day: 2.00  ?  Years: 25.00  ?  Pack years: 50.00  ?  Types: Cigarettes  ?  Quit date: 02/22/1986  ?  Years since quitting: 35.4  ? Smokeless tobacco: Never  ?Vaping Use  ? Vaping Use: Never used  ?Substance and Sexual Activity  ? Alcohol use: Yes  ?  Alcohol/week: 6.0 standard drinks  ?  Types: 6 Cans of beer per week  ? Drug use: No  ? Sexual activity: Not Currently  ?Other Topics Concern  ? Not on file  ?Social History Narrative  ? Not on file  ? ?Social Determinants of Health  ? ?Financial Resource Strain: Not on file  ?Food Insecurity: Not on file  ?Transportation Needs: Not on file  ?Physical Activity: Not on file  ?Stress: Not on file  ?Social Connections: Not on file  ?Intimate Partner Violence: Not on file  ? ? ? ?Ht '5\' 9"'$  (1.753 m)   Wt 158 lb 12.8 oz (72 kg)   BMI 23.45 kg/m?  ? ?Physical Exam: ? ?Well appearing NAD ?HEENT: Unremarkable ?Neck:  No JVD, no  thyromegally ?Lymphatics:  No adenopathy ?Back:  No CVA tenderness ?Lungs:  Clear with no wheezes ?HEART:  Regular rate rhythm, no murmurs, no rubs, no clicks ?Abd:  soft, positive bowel sounds, no organomegally, no rebound, no guarding ?Ext:  2 plus pulses, no edema, no cyanosis, no clubbing ?Skin:  No rashes no nodules ?Neuro:  CN II through XII intact, motor grossly intact ? ?EKG - probable sinus brady with PAC's. He has very low voltage making rhythm diagnosis more difficult  to be certain.  ? ?Assess/Plan:  ?1. Persistent atrial fib - he is maintaining probably maintaining NSR. He has stopped amio. I will ask him to undergo watchful waiting. If he feels worse, and is clearly back in atrial fib then I would rec restarting amio. If he feels no different then he will stay off of amio ?2. HTN - his bp is a little low and he will continue his current meds. ?3. CAD - he denies anginal symptoms.  ?4. Dyslipidemia - he will continue crestor. ?5. Coags - we will reduce his dose of eliquis to 2.5 mg twice daily. ?  ?Carleene Overlie Develle Sievers,MD ?

## 2021-08-09 DIAGNOSIS — Z743 Need for continuous supervision: Secondary | ICD-10-CM | POA: Diagnosis not present

## 2021-08-09 DIAGNOSIS — E162 Hypoglycemia, unspecified: Secondary | ICD-10-CM | POA: Diagnosis not present

## 2021-08-09 DIAGNOSIS — E161 Other hypoglycemia: Secondary | ICD-10-CM | POA: Diagnosis not present

## 2021-08-09 DIAGNOSIS — R42 Dizziness and giddiness: Secondary | ICD-10-CM | POA: Diagnosis not present

## 2021-08-09 DIAGNOSIS — R404 Transient alteration of awareness: Secondary | ICD-10-CM | POA: Diagnosis not present

## 2021-08-16 NOTE — Progress Notes (Unsigned)
CT angiogram reviewed in detail. While there is a size discrepancy going from right external iliac to the anastomosis of the right common femoral artery, I suspect this is mild aneurysmal change after right femoral endarterectomy.  No evidence of critical stenosis to the right limb of the femoral-femoral bypass graft. I recommended continued observation to the patient with ankle-brachial index and femoral-femoral bypass graft duplex again in 1 year. The patient is understanding and in agreement.  [name]. Stanford Breed, MD [facility] Phone Number: [phone] 08/17/2021 12:45 PM

## 2021-08-17 ENCOUNTER — Ambulatory Visit (INDEPENDENT_AMBULATORY_CARE_PROVIDER_SITE_OTHER): Payer: Medicare Other | Admitting: Vascular Surgery

## 2021-08-17 DIAGNOSIS — I739 Peripheral vascular disease, unspecified: Secondary | ICD-10-CM

## 2021-08-18 ENCOUNTER — Other Ambulatory Visit (HOSPITAL_COMMUNITY): Payer: Self-pay | Admitting: Family Medicine

## 2021-08-18 DIAGNOSIS — E1142 Type 2 diabetes mellitus with diabetic polyneuropathy: Secondary | ICD-10-CM | POA: Diagnosis not present

## 2021-08-18 DIAGNOSIS — E162 Hypoglycemia, unspecified: Secondary | ICD-10-CM | POA: Diagnosis not present

## 2021-08-18 DIAGNOSIS — G4710 Hypersomnia, unspecified: Secondary | ICD-10-CM | POA: Diagnosis not present

## 2021-08-18 DIAGNOSIS — R079 Chest pain, unspecified: Secondary | ICD-10-CM

## 2021-09-10 DIAGNOSIS — E46 Unspecified protein-calorie malnutrition: Secondary | ICD-10-CM | POA: Diagnosis not present

## 2021-09-10 DIAGNOSIS — R634 Abnormal weight loss: Secondary | ICD-10-CM | POA: Diagnosis not present

## 2021-09-14 ENCOUNTER — Other Ambulatory Visit (HOSPITAL_COMMUNITY): Payer: Self-pay | Admitting: Family Medicine

## 2021-09-14 DIAGNOSIS — R634 Abnormal weight loss: Secondary | ICD-10-CM

## 2021-09-20 ENCOUNTER — Ambulatory Visit (HOSPITAL_COMMUNITY)
Admission: RE | Admit: 2021-09-20 | Discharge: 2021-09-20 | Disposition: A | Discharge status: Home / Self Care | Payer: Medicare Other | Source: Ambulatory Visit | Attending: Family Medicine | Admitting: Family Medicine

## 2021-09-20 DIAGNOSIS — K4090 Unilateral inguinal hernia, without obstruction or gangrene, not specified as recurrent: Secondary | ICD-10-CM | POA: Diagnosis not present

## 2021-09-20 DIAGNOSIS — R634 Abnormal weight loss: Secondary | ICD-10-CM | POA: Diagnosis not present

## 2021-09-20 DIAGNOSIS — N281 Cyst of kidney, acquired: Secondary | ICD-10-CM | POA: Diagnosis not present

## 2021-09-20 DIAGNOSIS — J9811 Atelectasis: Secondary | ICD-10-CM | POA: Diagnosis not present

## 2021-09-20 DIAGNOSIS — K8020 Calculus of gallbladder without cholecystitis without obstruction: Secondary | ICD-10-CM | POA: Diagnosis not present

## 2021-09-20 LAB — POCT I-STAT CREATININE: Creatinine, Ser: 1.4 mg/dL — ABNORMAL HIGH (ref 0.61–1.24)

## 2021-09-20 MED ORDER — IOHEXOL 300 MG/ML  SOLN
100.0000 mL | Freq: Once | INTRAMUSCULAR | Status: AC | PRN
  Administered 2021-09-20: 80 mL via INTRAVENOUS

## 2021-09-21 DIAGNOSIS — H0102A Squamous blepharitis right eye, upper and lower eyelids: Secondary | ICD-10-CM | POA: Diagnosis not present

## 2021-09-21 DIAGNOSIS — H10413 Chronic giant papillary conjunctivitis, bilateral: Secondary | ICD-10-CM | POA: Diagnosis not present

## 2021-09-21 DIAGNOSIS — H02834 Dermatochalasis of left upper eyelid: Secondary | ICD-10-CM | POA: Diagnosis not present

## 2021-09-21 DIAGNOSIS — H02831 Dermatochalasis of right upper eyelid: Secondary | ICD-10-CM | POA: Diagnosis not present

## 2021-09-21 DIAGNOSIS — Z961 Presence of intraocular lens: Secondary | ICD-10-CM | POA: Diagnosis not present

## 2021-09-21 DIAGNOSIS — H31091 Other chorioretinal scars, right eye: Secondary | ICD-10-CM | POA: Diagnosis not present

## 2021-09-21 DIAGNOSIS — H0102B Squamous blepharitis left eye, upper and lower eyelids: Secondary | ICD-10-CM | POA: Diagnosis not present

## 2021-09-21 DIAGNOSIS — E119 Type 2 diabetes mellitus without complications: Secondary | ICD-10-CM | POA: Diagnosis not present

## 2021-09-21 DIAGNOSIS — H3561 Retinal hemorrhage, right eye: Secondary | ICD-10-CM | POA: Diagnosis not present

## 2021-09-21 DIAGNOSIS — H26493 Other secondary cataract, bilateral: Secondary | ICD-10-CM | POA: Diagnosis not present

## 2021-09-21 DIAGNOSIS — H353131 Nonexudative age-related macular degeneration, bilateral, early dry stage: Secondary | ICD-10-CM | POA: Diagnosis not present

## 2021-09-22 ENCOUNTER — Other Ambulatory Visit: Payer: Self-pay | Admitting: Cardiology

## 2021-09-23 ENCOUNTER — Encounter: Payer: Self-pay | Admitting: Physician Assistant

## 2021-09-27 DIAGNOSIS — E46 Unspecified protein-calorie malnutrition: Secondary | ICD-10-CM | POA: Diagnosis not present

## 2021-09-27 DIAGNOSIS — R935 Abnormal findings on diagnostic imaging of other abdominal regions, including retroperitoneum: Secondary | ICD-10-CM | POA: Diagnosis not present

## 2021-10-19 ENCOUNTER — Telehealth: Payer: Self-pay

## 2021-10-19 NOTE — Telephone Encounter (Signed)
   Pre-operative Risk Assessment    Patient Name: [name]  DOB: 07-26-1940 MRN: [id number]      Request for Surgical Clearance    Procedure:   Endoscopy  Date of Surgery:  Clearance 11/01/21                                 Surgeon:  Dr. Watt Climes Surgeon's Group or Practice Name:  [facility] Phone number:  [phone] Fax number:  [phone]   Type of Clearance Requested:   - Pharmacy:  Hold Apixaban (Eliquis)     Type of Anesthesia:   Propofol   Additional requests/questions:    Signed, Wonda Horner   10/19/2021, 12:13 PM

## 2021-10-19 NOTE — Telephone Encounter (Signed)
   Patient Name: [name]  DOB: 12/20/1940 MRN: [id number]  Primary Cardiologist: Peter Martinique, MD  Clinical pharmacist have reviewed the patient's past medical history, labs, and current medications as part of pre-operative protocol coverage.   The following recommendations have been made:  Patient with diagnosis of afib on Eliquis for anticoagulation.     Procedure: endoscopy Date of procedure: 11/01/21   CHA2DS2-VASc Score = 6  This indicates a 9.7% annual risk of stroke. The patient's score is based upon: CHF History: 1 HTN History: 1 Diabetes History: 1 Stroke History: 0 Vascular Disease History: 1 Age Score: 2 Gender Score: 0   CrCl 53m/min Platelet count 132K   Per office protocol, patient can hold Eliquis for 1-2 days prior to procedure.    I will route this recommendation to the requesting party via Epic fax function and remove from pre-op pool.  Please call with questions.  ELenna Sciara NP 10/19/2021, 1:39 PM

## 2021-10-19 NOTE — Telephone Encounter (Signed)
Patient with diagnosis of afib on Eliquis for anticoagulation.    Procedure: endoscopy Date of procedure: 11/01/21  CHA2DS2-VASc Score = 6  This indicates a 9.7% annual risk of stroke. The patient's score is based upon: CHF History: 1 HTN History: 1 Diabetes History: 1 Stroke History: 0 Vascular Disease History: 1 Age Score: 2 Gender Score: 0   CrCl 25m/min Platelet count 132K  Per office protocol, patient can hold Eliquis for 1-2 days prior to procedure.    **This guidance is not considered finalized until pre-operative APP has relayed final recommendations.**

## 2021-10-20 ENCOUNTER — Ambulatory Visit: Payer: Medicare Other | Admitting: Physician Assistant

## 2021-11-01 DIAGNOSIS — K449 Diaphragmatic hernia without obstruction or gangrene: Secondary | ICD-10-CM | POA: Diagnosis not present

## 2021-11-01 DIAGNOSIS — R634 Abnormal weight loss: Secondary | ICD-10-CM | POA: Diagnosis not present

## 2021-11-01 DIAGNOSIS — R933 Abnormal findings on diagnostic imaging of other parts of digestive tract: Secondary | ICD-10-CM | POA: Diagnosis not present

## 2021-11-01 DIAGNOSIS — K2940 Chronic atrophic gastritis without bleeding: Secondary | ICD-10-CM | POA: Diagnosis not present

## 2021-11-07 NOTE — Progress Notes (Unsigned)
[name] Date of Birth: 1940-09-29 Medical Record #[id number]  History of Present Illness: Mr. Boruff is seen for follow up Afib and CAD.  He has a history of coronary disease and is status post redo coronary bypass surgery in 2005 after emergent stenting of the left main. This included a free RIMA graft to the OM, SVG to diagonal, and SVG to PDA. LIMA to LAD was intact from original surgery. He has had an old anterior myocardial infarction.    In April 2016 he was admitted with SBO and underwent surgery with exploratory lap and lysis of adhesions. His post op course was complicated by marked volume overload due to IVF with over 20 lb weight gain. He was diuresed. Echo showed EF 45%.   In August 2016 he presented with progressive claudication in the left hip and leg. Dopplers showed severe PAD. Angiography was done by Dr. Oneida Alar and showed severe left iliac and bilateral common femoral disease. He had an abnormal Myoview study and cardiac cath was done and showed patent grafts.  He underwent surgery by Dr. Oneida Alar with bilateral femoral endarterectomy and fem- fem BPG.   Admit 01/04-01/07/18 w/ URI, CHF, new dx atrial fib. Rate control w/ Coreg & dig. Bradycardia w/ Cardizem. Started on Eliquis and Plavix discontinued. TSH nl, EF 40-45% CHA2DS2VASc=6 (age x 2, HTN, DM, CAD, CHF). He was anticoagulated for 4 weeks and underwent DCCV on 05/05/16.   Was seen on 06/25/2019 and was noted to be in atrial fibrillation with slow ventricular response.  His furosemide was increased to 40 mg Monday Wednesday Friday and he was instructed to take 20 mg on the other days his Lanoxin was stopped.  he underwent DCCV on  07/09/2019. Unfortunately he had early recurrence of afib. He was seen by Dr Lovena Le in June. Discussed AAD therapy with Tikosyn versus amiodarone. Not felt to be a candidate for ablation due to age and multiple co morbidities. Patient decided to try amiodarone and was started on po dose 200 mg bid.  He underwent DCCV on 12/05/19.   LE arterial dopplers in April 2022 showed noncompressible vessels. > 50% stenosis in femoral graft.  Carotid dopplers showed 40-59% RICA stenosis. 1-74% on LICA. Now followed by Dr Stanford Breed.  On his last visit he was back in Afib so we stopped his amiodarone. When seen back by Dr Lovena Le in May he was back in NSR. Suggested that if he did have symptomatic Afib would resume amiodarone.    Current Outpatient Medications on File Prior to Visit  Medication Sig Dispense Refill   ALPRAZolam (XANAX) 1 MG tablet Take 1 mg by mouth daily as needed for anxiety.     apixaban (ELIQUIS) 2.5 MG TABS tablet Take 1 tablet (2.5 mg total) by mouth 2 (two) times daily. 60 tablet 11   empagliflozin (JARDIANCE) 10 MG TABS tablet Take 1 tablet (10 mg total) by mouth daily before breakfast. (Patient not taking: Reported on 08/04/2021) 90 tablet 3   furosemide (LASIX) 20 MG tablet TAKE 2 TABLETS (40 MG TOTAL) BY MOUTH DAILY. 40 MG MON, WED & FRI '20MG'$  ON ALL OTHER DAYS 360 tablet 1   glimepiride (AMARYL) 4 MG tablet Take 4 mg by mouth daily.     iron polysaccharides (NIFEREX) 150 MG capsule Take 150 mg by mouth at bedtime.      losartan (COZAAR) 50 MG tablet Take 50 mg by mouth daily.     nitroGLYCERIN (NITROSTAT) 0.4 MG SL tablet PLACE 1 TABLET  UNDER THE TONGUE EVERY 5 MINUTES AS NEEDED FOR CHEST PAIN. 25 tablet 3   pioglitazone (ACTOS) 45 MG tablet Take 45 mg by mouth daily.      potassium chloride (KLOR-CON) 10 MEQ tablet TAKE 1 TABLET BY MOUTH EVERY DAY 90 tablet 3   rosuvastatin (CRESTOR) 10 MG tablet Take 1 tablet (10 mg total) by mouth daily. 90 tablet 3   No current facility-administered medications on file prior to visit.    Allergies  Allergen Reactions   Hctz [Hydrochlorothiazide] Other (See Comments)    Hypokalemia    Glipizide     Other reaction(s): Weak    Past Medical History:  Diagnosis Date   Acute inferior myocardial infarction (HCC)    hx   Acute on chronic  diastolic CHF (congestive heart failure), NYHA class 1 (HCC) 08/09/2014   Anemia    Atrial fibrillation (HCC)    BPH (benign prostatic hyperplasia)    CAD (coronary artery disease)    a. s/p CABG in 2005 with RIMA-OM, SVG-D1 and SVG-PDA b. 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"   Myocardial infarction ([place]) x2  1986, 2005   Osteoarthritis    IN FINGERS (03/31/2016)   PAD (peripheral artery disease) ([place])    Type II diabetes mellitus ([place])     Past Surgical History:  Procedure Laterality Date   CARDIAC CATHETERIZATION  12/23/2014   Procedure: Left Heart Cath and Cors/Grafts Angiography;  Surgeon: Latish Toutant M Martinique, MD;  Location: [place] [facility];  Service: Cardiovascular;;   CARDIOVERSION N/A 05/05/2016   Procedure: CARDIOVERSION;  Surgeon: Lelon Perla, MD;  Location: [place];  Service: Cardiovascular;  Laterality: N/A;   CARDIOVERSION N/A 07/09/2019   Procedure: CARDIOVERSION;  Surgeon: Josue Hector, MD;  Location[facility] ENDOSCOPY;  Service: Cardiovascular;  Laterality: N/A;   CARDIOVERSION N/A 12/05/2019   Procedure: CARDIOVERSION;  Surgeon: Satira Sark, MD;  [facility];  Service: Cardiovascular;  Laterality: N/A;   CATARACT EXTRACTION W/ INTRAOCULAR LENS  IMPLANT, BILATERAL Bilateral    CORONARY ANGIOPLASTY WITH [place]; redo 2005   ; free Rima to OM, svg-diag,svg-pda   ENDARTERECTOMY FEMORAL Bilateral 12/31/2014   Procedure: BILATERAL FEMORAL ENDARTERECTOMY;  Surgeon: Elam Dutch, MD;  Location: [place];  Service: Vascular;  Laterality: Bilateral;   FEMORAL-FEMORAL BYPASS GRAFT Bilateral 12/31/2014   Procedure: FEMORAL-FEMORAL ARTERY BYPASS GRAFT;  Surgeon: Elam Dutch, MD;  Location: [place];  Service: Vascular;  Laterality: Bilateral;    KYPHOPLASTY  02/29/2012   Procedure: KYPHOPLASTY;  Surgeon: Sinclair Ship, MD;  Location: [place];  Service: Orthopedics;  Laterality: Bilateral;  T 10 kyphoplasty   LAPAROTOMY N/A 08/02/2014   Procedure: EXPLORATORY LAPAROTOMY LYSIS OF ADHESIONS;  Surgeon: Donnie Mesa, MD;  Location: [place];  Service: General;  Laterality: N/A;   MELANOMA EXCISION     scalp   PERIPHERAL VASCULAR CATHETERIZATION N/A 12/05/2014   Procedure: Abdominal Aortogram;  Surgeon: Elam Dutch, MD;  Location: [place] [facility];  Service: Cardiovascular;  Laterality: N/A;   ROTATOR CUFF REPAIR Right    TONSILLECTOMY      Social History   Tobacco Use  Smoking Status Former   Packs/day: 2.00  Years: 25.00   Total pack years: 50.00   Types: Cigarettes   Quit date: 02/22/1986   Years since quitting: 35.7  Smokeless Tobacco Never    Social History   Substance and Sexual Activity  Alcohol Use Yes   Alcohol/week: 6.0 standard drinks of alcohol   Types: 6 Cans of beer per week    Family History  Problem Relation Age of Onset   Dementia Mother     Review of Systems: As noted in history of present illness.  All other systems were reviewed and are negative.  Physical Exam: There were no vitals taken for this visit. GENERAL:  Well appearing WM in NAD HEENT:  PERRL, EOMI, sclera are clear. Oropharynx is clear. NECK:  No jugular venous distention, carotid upstroke brisk and symmetric, right carotid bruit, no thyromegaly or adenopathy LUNGS:  Clear to auscultation bilaterally CHEST:  Unremarkable HEART:  RRR,  PMI not displaced or sustained,S1 and S2 within normal limits, no S3, no S4: no clicks, no rubs, no murmurs ABD:  Soft, nontender. BS +, no masses or bruits. No hepatomegaly, no splenomegaly EXT:  2 + pulses throughout, tr-1+ edema, no cyanosis no clubbing SKIN:  Warm and dry.  No rashes NEURO:  Alert and oriented x 3. Cranial nerves II through XII intact. PSYCH:  Cognitively  intact   LABORATORY DATA: Lab Results  Component Value Date   WBC 4.9 12/03/2019   HGB 11.1 (L) 12/03/2019   HCT 32.6 (L) 12/03/2019   PLT 132 (L) 12/03/2019   GLUCOSE 105 (H) 12/03/2019   CHOL 105 04/02/2016   TRIG 54 04/02/2016   HDL 51 04/02/2016   LDLCALC 43 04/02/2016   ALT 15 (L) 12/25/2014   AST 24 12/25/2014   NA 136 12/03/2019   K 4.0 12/03/2019   CL 102 12/03/2019   CREATININE 1.40 (H) 09/20/2021   BUN 24 (H) 12/03/2019   CO2 22 12/03/2019   TSH 1.009 03/31/2016   INR 1.9 (H) 12/05/2019   HGBA1C 6.9 (H) 12/03/2019   Dated 12/09/16: cholesterol 135, triglycerides 48, HDL 59, LDL 66. A1c 6.6%. Hgb 12. CMET and TSH normal Dated 07/11/19: A1c 6.6%. BUN 23. Creatinine 1.17. otherwise CMET normal. Cholesterol 139, triglycerides 40, HDL 78, LDL 43. Dig level 0.4 Dated 02/10/20: A1c 7.3%.  Dated 08/05/20: A1c 6.5% Dated 02/17/21: cholesterol 148, triglycerides 53, HDL 90, LDL 47. Creatinine 1.64. otherwise CMET and CBC normal. A1c 6.9% Dated 08/18/21: A1c 6.5% Dated 09/10/21: creatinine 1.4. GFR 50. Hgb 12.2. otherwise CMET, CBC, TSH normal.  Ecg today shows Afib rate 55. Nonspecific TWA. I have personally reviewed and interpreted this study.   ECHO: 04/02/2016 - Left ventricle: The cavity size was mildly dilated. Wall   thickness was normal. Systolic function was mildly to moderately   reduced. The estimated ejection fraction was in the range of 40%   to 45%. There is akinesis of the inferolateral myocardium. - Mitral valve: Calcified annulus. There was mild regurgitation. - Aortic valve: Indexed valve area (VTI): 1.11 cm^2/m^2. Peak velocity ratio of LVOT to aortic valve: 0.71. Mean gradient (S): 5 mm Hg. Peak gradient (S): 9 mm Hg. - Left atrium: The atrium was moderately dilated. - Pulmonary arteries: Systolic pressure was moderately increased.   PA peak pressure: 51 mm Hg (S).  Impressions:  - Akinesis of the inferolateral wall with overall mild to moderate   LV  dysfunction; mild MR; moderate LAE; mild TR; moderately   elevated pulmonary pressure.   Assessment / Plan:  1. Coronary disease status post redo CABG in 2005. Remote anterior myocardial infarction. Prior left main stent. Cardiac cath in September 2016 showed patent grafts. Continue medical therapy. No significant anginal symptoms.  2. Chronic systolic/diastolic CHF EF 16-10%. He has stable class 2 symptoms. Continue current therapy with lasix, Coreg, and losartan. I would like to get him on an SGLT 2 inhibitor. The patient would like for me to discuss with Dr Harrington Challenger.   3. Atrial fibrillation s/p DCCV on 05/05/16 and again in 2021 with ERAD. Now on amiodarone with recurrent Afib today. Rate is well controlled. He is asymptomatic. [place] Vasc score of 6. Will continue Eliquis long term. Recommend stopping amiodarone at this point. Will continue with rate control strategy only.   4. Hyperlipidemia on Zocor. Excellent control  5. Severe PAD- S/p bilateral femoral endarterectomy and fem-fem bypass. Stressed importance of regular aerobic walking. Seen by [facility] in April 2022 and LE arterial dopplers and carotid dopplers were stable.   6. DM type 2 on Actos and glimiperide. I would really like to stop the Actos and switch to a SGLT 2 inhibitor given CHF. Will send recommendation to Dr Harrington Challenger.   7. HTN   8. CKD stage 3b  I will follow up in 6 months.

## 2021-11-08 DIAGNOSIS — R634 Abnormal weight loss: Secondary | ICD-10-CM | POA: Diagnosis not present

## 2021-11-08 DIAGNOSIS — E1142 Type 2 diabetes mellitus with diabetic polyneuropathy: Secondary | ICD-10-CM | POA: Diagnosis not present

## 2021-11-11 ENCOUNTER — Encounter: Payer: Self-pay | Admitting: Cardiology

## 2021-11-11 ENCOUNTER — Ambulatory Visit: Payer: Medicare Other | Admitting: Cardiology

## 2021-11-11 VITALS — BP 128/76 | HR 69 | Ht 69.0 in | Wt 132.2 lb

## 2021-11-11 DIAGNOSIS — I739 Peripheral vascular disease, unspecified: Secondary | ICD-10-CM | POA: Diagnosis not present

## 2021-11-11 DIAGNOSIS — I25810 Atherosclerosis of coronary artery bypass graft(s) without angina pectoris: Secondary | ICD-10-CM | POA: Diagnosis not present

## 2021-11-11 DIAGNOSIS — I5042 Chronic combined systolic (congestive) and diastolic (congestive) heart failure: Secondary | ICD-10-CM | POA: Diagnosis not present

## 2021-11-11 DIAGNOSIS — I480 Paroxysmal atrial fibrillation: Secondary | ICD-10-CM | POA: Diagnosis not present

## 2021-11-22 ENCOUNTER — Other Ambulatory Visit: Payer: Self-pay | Admitting: Cardiology

## 2021-12-15 DIAGNOSIS — E46 Unspecified protein-calorie malnutrition: Secondary | ICD-10-CM | POA: Diagnosis not present

## 2021-12-15 DIAGNOSIS — K8020 Calculus of gallbladder without cholecystitis without obstruction: Secondary | ICD-10-CM | POA: Diagnosis not present

## 2022-03-09 ENCOUNTER — Telehealth: Payer: Self-pay | Admitting: Cardiology

## 2022-03-09 NOTE — Telephone Encounter (Signed)
Patient returning call.

## 2022-03-09 NOTE — Telephone Encounter (Signed)
Pt c/o medication issue:  1. Name of Medication:   empagliflozin (JARDIANCE) 10 MG TABS tablet    2. How are you currently taking this medication (dosage and times per day)?    3. Are you having a reaction (difficulty breathing--STAT)? no  4. What is your medication issue? Patient is in the donut whole unable to afford medication. Please advise

## 2022-03-09 NOTE — Telephone Encounter (Signed)
Attempted to reach patient. Left message  Could apply for assistance:  [email or web address]

## 2022-03-09 NOTE — Telephone Encounter (Signed)
Called patient, he is in the donut hole and the medication cost is too much.   I did give samples of Jardiance along with information for patient assistance.   Patient verbalized understanding, will come to pick up on Monday.

## 2022-03-14 DIAGNOSIS — Z0000 Encounter for general adult medical examination without abnormal findings: Secondary | ICD-10-CM | POA: Diagnosis not present

## 2022-03-14 DIAGNOSIS — D649 Anemia, unspecified: Secondary | ICD-10-CM | POA: Diagnosis not present

## 2022-03-14 DIAGNOSIS — E1142 Type 2 diabetes mellitus with diabetic polyneuropathy: Secondary | ICD-10-CM | POA: Diagnosis not present

## 2022-03-14 DIAGNOSIS — Z79899 Other long term (current) drug therapy: Secondary | ICD-10-CM | POA: Diagnosis not present

## 2022-03-14 DIAGNOSIS — Z6822 Body mass index (BMI) 22.0-22.9, adult: Secondary | ICD-10-CM | POA: Diagnosis not present

## 2022-03-14 DIAGNOSIS — E785 Hyperlipidemia, unspecified: Secondary | ICD-10-CM | POA: Diagnosis not present

## 2022-03-14 DIAGNOSIS — I739 Peripheral vascular disease, unspecified: Secondary | ICD-10-CM | POA: Diagnosis not present

## 2022-03-14 DIAGNOSIS — I10 Essential (primary) hypertension: Secondary | ICD-10-CM | POA: Diagnosis not present

## 2022-03-23 ENCOUNTER — Other Ambulatory Visit: Payer: Self-pay | Admitting: Cardiology

## 2022-05-06 NOTE — Progress Notes (Deleted)
[name] Date of Birth: 1941-02-08 Medical Record [id number]  History of Present Illness: [name] is seen for follow up Afib and CAD.  He has a history of coronary disease and is status post redo coronary bypass surgery in 2005 after emergent stenting of the left main. This included a free RIMA graft to the OM, SVG to diagonal, and SVG to PDA. LIMA to LAD was intact from original surgery. He has had an old anterior myocardial infarction.    In April 2016 he was admitted with SBO and underwent surgery with exploratory lap and lysis of adhesions. His post op course was complicated by marked volume overload due to IVF with over 20 lb weight gain. He was diuresed. Echo showed EF 45%.   In August 2016 he presented with progressive claudication in the left hip and leg. Dopplers showed severe PAD. Angiography was done by Dr. Oneida Alar and showed severe left iliac and bilateral common femoral disease. He had an abnormal Myoview study and cardiac cath was done and showed patent grafts.  He underwent surgery by Dr. Oneida Alar with bilateral femoral endarterectomy and fem- fem BPG.   Admit 01/04-01/07/18 w/ URI, CHF, new dx atrial fib. Rate control w/ Coreg & dig. Bradycardia w/ Cardizem. Started on Eliquis and Plavix discontinued. TSH nl, EF 40-45% CHA2DS2VASc=6 (age x 2, HTN, DM, CAD, CHF). He was anticoagulated for 4 weeks and underwent DCCV on 05/05/16.   Was seen on 06/25/2019 and was noted to be in atrial fibrillation with slow ventricular response.  His furosemide was increased to 40 mg Monday Wednesday Friday and he was instructed to take 20 mg on the other days his Lanoxin was stopped.  he underwent DCCV on  07/09/2019. Unfortunately he had early recurrence of afib. He was seen by Dr Lovena Le in June. Discussed AAD therapy with Tikosyn versus amiodarone. Not felt to be a candidate for ablation due to age and multiple co morbidities. Patient decided to try amiodarone and was started on po dose 200 mg bid.  He underwent DCCV on 12/05/19.   LE arterial dopplers in April 2022 showed noncompressible vessels. > 50% stenosis in femoral graft.  Carotid dopplers showed 40-59% RICA stenosis. [id number] on LICA. Now followed by Dr Stanford Breed. Was seen in April and there were elevated velocities noted. Subsequent CT scan done looked Ok.   When seen in February it appeared he was  in Afib with a slow response so we stopped his amiodarone. When seen back by Dr Lovena Le in May he was back in NSR. Suggested that if he did have symptomatic Afib would resume amiodarone.   He reports he had a 35 lb weight loss. Was having hypoglycemic spells. Actos and glimiperide were stopped and now on Jardiance only  Denies any palpitations, dizziness, chest pain or SOB.    Current Outpatient Medications on File Prior to Visit  Medication Sig Dispense Refill   ALPRAZolam (XANAX) 1 MG tablet Take 1 mg by mouth daily as needed for anxiety.     apixaban (ELIQUIS) 2.5 MG TABS tablet Take 1 tablet (2.5 mg total) by mouth 2 (two) times daily. 60 tablet 11   empagliflozin (JARDIANCE) 10 MG TABS tablet Take 1 tablet (10 mg total) by mouth daily before breakfast. 90 tablet 3   furosemide (LASIX) 20 MG tablet TAKE 2 TABLETS (40 MG TOTAL) BY MOUTH DAILY. 40 MG MON, WED & FRI 20MG ON ALL OTHER DAYS 180 tablet 3   iron polysaccharides (NIFEREX) 150 MG capsule  Take 150 mg by mouth at bedtime.      losartan (COZAAR) 50 MG tablet Take 50 mg by mouth daily.     nitroGLYCERIN (NITROSTAT) 0.4 MG SL tablet PLACE 1 TABLET UNDER THE TONGUE EVERY 5 MINUTES AS NEEDED FOR CHEST PAIN. 25 tablet 3   potassium chloride (KLOR-CON) 10 MEQ tablet TAKE 1 TABLET BY MOUTH EVERY DAY 90 tablet 3   rosuvastatin (CRESTOR) 10 MG tablet Take 1 tablet (10 mg total) by mouth daily. 90 tablet 3   No current facility-administered medications on file prior to visit.    Allergies  Allergen Reactions   Hctz [Hydrochlorothiazide] Other (See Comments)    Hypokalemia    Glipizide      Other reaction(s): Weak    Past Medical History:  Diagnosis Date   Acute inferior myocardial infarction (HCC)    hx   Acute on chronic diastolic CHF (congestive heart failure), NYHA class 1 (HCC) 08/09/2014   Anemia    Atrial fibrillation (HCC)    BPH (benign prostatic hyperplasia)    CAD (coronary artery disease)    a. s/p CABG in 2005 with RIMA-OM, SVG-D1 and SVG-PDA b. 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"   Myocardial infarction ([place]) x2  1986, 2005   Osteoarthritis    IN FINGERS (03/31/2016)   PAD (peripheral artery disease) ([place])    Type II diabetes mellitus ([place])     Past Surgical History:  Procedure Laterality Date   CARDIAC CATHETERIZATION  12/23/2014   Procedure: Left Heart Cath and Cors/Grafts Angiography;  Surgeon: Azelyn Batie M Martinique, MD;  Location: [place] [facility];  Service: Cardiovascular;;   CARDIOVERSION N/A 05/05/2016   Procedure: CARDIOVERSION;  Surgeon: Lelon Perla, MD;  Location: [place];  Service: Cardiovascular;  Laterality: N/A;   CARDIOVERSION N/A 07/09/2019   Procedure: CARDIOVERSION;  Surgeon: Josue Hector, MD;  Location: [facility] ENDOSCOPY;  Service: Cardiovascular;  Laterality: N/A;   CARDIOVERSION N/A 12/05/2019   Procedure: CARDIOVERSION;  Surgeon: Satira Sark, MD;  [facility];  Service: Cardiovascular;  Laterality: N/A;   CATARACT EXTRACTION W/ INTRAOCULAR LENS  IMPLANT, BILATERAL Bilateral    CORONARY ANGIOPLASTY WITH [place]; redo 2005   ; free Rima to OM, svg-diag,svg-pda   ENDARTERECTOMY FEMORAL Bilateral 12/31/2014   Procedure: BILATERAL FEMORAL ENDARTERECTOMY;  Surgeon: Elam Dutch, MD;  Location: [place];  Service: Vascular;  Laterality: Bilateral;   FEMORAL-FEMORAL BYPASS GRAFT Bilateral 12/31/2014    Procedure: FEMORAL-FEMORAL ARTERY BYPASS GRAFT;  Surgeon: Elam Dutch, MD;  Location: [place];  Service: Vascular;  Laterality: Bilateral;   KYPHOPLASTY  02/29/2012   Procedure: KYPHOPLASTY;  Surgeon: Sinclair Ship, MD;  Location: [place];  Service: Orthopedics;  Laterality: Bilateral;  T 10 kyphoplasty   LAPAROTOMY N/A 08/02/2014   Procedure: EXPLORATORY LAPAROTOMY LYSIS OF ADHESIONS;  Surgeon: Donnie Mesa, MD;  Location: [place];  Service: General;  Laterality: N/A;   MELANOMA EXCISION     scalp   PERIPHERAL VASCULAR CATHETERIZATION N/A 12/05/2014   Procedure: Abdominal Aortogram;  Surgeon: Elam Dutch, MD;  Location: [place] [facility];  Service: Cardiovascular;  Laterality: N/A;   ROTATOR CUFF REPAIR Right    TONSILLECTOMY  Social History   Tobacco Use  Smoking Status Former   Packs/day: 2.00   Years: 25.00   Total pack years: 50.00   Types: Cigarettes   Quit date: 02/22/1986   Years since quitting: 36.2  Smokeless Tobacco Never    Social History   Substance and Sexual Activity  Alcohol Use Yes   Alcohol/week: 6.0 standard drinks of alcohol   Types: 6 Cans of beer per week    Family History  Problem Relation Age of Onset   Dementia Mother     Review of Systems: As noted in history of present illness.  All other systems were reviewed and are negative.  Physical Exam: There were no vitals taken for this visit. GENERAL:  Well appearing WM in NAD HEENT:  PERRL, EOMI, sclera are clear. Oropharynx is clear. NECK:  No jugular venous distention, carotid upstroke brisk and symmetric, right carotid bruit, no thyromegaly or adenopathy LUNGS:  Clear to auscultation bilaterally CHEST:  Unremarkable HEART:  RRR,  PMI not displaced or sustained,S1 and S2 within normal limits, no S3, no S4: no clicks, no rubs, no murmurs ABD:  Soft, nontender. BS +, no masses or bruits. No hepatomegaly, no splenomegaly EXT:  2 + pulses throughout, no edema, no cyanosis no  clubbing SKIN:  Warm and dry.  No rashes NEURO:  Alert and oriented x 3. Cranial nerves II through XII intact. PSYCH:  Cognitively intact   LABORATORY DATA: Lab Results  Component Value Date   WBC 4.9 12/03/2019   HGB 11.1 (L) 12/03/2019   HCT 32.6 (L) 12/03/2019   PLT 132 (L) 12/03/2019   GLUCOSE 105 (H) 12/03/2019   CHOL 105 04/02/2016   TRIG 54 04/02/2016   HDL 51 04/02/2016   LDLCALC 43 04/02/2016   ALT 15 (L) 12/25/2014   AST 24 12/25/2014   NA 136 12/03/2019   K 4.0 12/03/2019   CL 102 12/03/2019   CREATININE 1.40 (H) 09/20/2021   BUN 24 (H) 12/03/2019   CO2 22 12/03/2019   TSH 1.009 03/31/2016   INR 1.9 (H) 12/05/2019   HGBA1C 6.9 (H) 12/03/2019   Dated 12/09/16: cholesterol 135, triglycerides 48, HDL 59, LDL 66. A1c 6.6%. Hgb 12. CMET and TSH normal Dated 07/11/19: A1c 6.6%. BUN 23. Creatinine 1.17. otherwise CMET normal. Cholesterol 139, triglycerides 40, HDL 78, LDL 43. Dig level 0.4 Dated 02/10/20: A1c 7.3%.  Dated 08/05/20: A1c 6.5% Dated 02/17/21: cholesterol 148, triglycerides 53, HDL 90, LDL 47. Creatinine 1.64. otherwise CMET and CBC normal. A1c 6.9% Dated 08/18/21: A1c 6.5% Dated 09/10/21: creatinine 1.4. GFR 50. Hgb 12.2. otherwise CMET, CBC, TSH normal. Dated 03/14/22: cholesterol 157, triglycerides 41, HDL 103, LDL 44, A1c 8.2%. creatinine 1.28. otherwise CMET and CBC normal.    ECHO: 04/02/2016 - Left ventricle: The cavity size was mildly dilated. Wall   thickness was normal. Systolic function was mildly to moderately   reduced. The estimated ejection fraction was in the range of 40%   to 45%. There is akinesis of the inferolateral myocardium. - Mitral valve: Calcified annulus. There was mild regurgitation. - Aortic valve: Indexed valve area (VTI): 1.11 cm^2/m^2. Peak velocity ratio of LVOT to aortic valve: 0.71. Mean gradient (S): 5 mm Hg. Peak gradient (S): 9 mm Hg. - Left atrium: The atrium was moderately dilated. - Pulmonary arteries: Systolic  pressure was moderately increased.   PA peak pressure: 51 mm Hg (S).  Impressions:  - Akinesis of the inferolateral wall with overall mild to moderate  LV dysfunction; mild MR; moderate LAE; mild TR; moderately   elevated pulmonary pressure.   Assessment / Plan: 1. Coronary disease status post redo CABG in 2005. Remote anterior myocardial infarction. Prior left main stent. Cardiac cath in September 2016 showed patent grafts. Continue medical therapy. No significant anginal symptoms.  2. Chronic systolic/diastolic CHF EF [id number]. He has stable class 2 symptoms. Continue current therapy with lasix, Coreg, and losartan. On Jardiance.   3. Atrial fibrillation s/p DCCV on 05/05/16 and again in 2021 with ERAD. Was on amiodarone but concern in Feb was his rate was slow and maybe recurrent Afib. Amiodarone was held. When seen by Dr Lovena Le in May Ecg showed NSR with long PR and rate 63. He is asymptomatic. [place] Vasc score of 6. Will continue Eliquis long term. Will continue with rate control strategy now. If symptomatic AFib recurs could resume amiodarone.   4. Hyperlipidemia on Zocor. Excellent control  5. Severe PAD- S/p bilateral femoral endarterectomy and fem-fem bypass. Stressed importance of regular aerobic walking. Seen by [facility] in April 2022 and LE arterial dopplers and carotid dopplers were stable. Repeat in April showed some increase in velocities but CTA was satisfactory.   6. DM type 2- significant weight loss and hypoglycemia. Now off  Actos and glimiperide. On Jardiance now and doing well.   7. HTN   8. CKD stage 3b  I will follow up in 6 months.

## 2022-05-12 ENCOUNTER — Ambulatory Visit: Payer: Medicare Other | Admitting: Cardiology

## 2022-05-12 ENCOUNTER — Telehealth: Payer: Self-pay

## 2022-05-12 NOTE — Telephone Encounter (Signed)
Called and left a detailed message asking patient to give office a call back to get his 2/15 appointment with Dr. Martinique rescheduled.

## 2022-05-12 NOTE — Telephone Encounter (Signed)
Left voicemail to reschedule patient from 2/15 appointment, provider is sick

## 2022-05-30 DIAGNOSIS — D225 Melanocytic nevi of trunk: Secondary | ICD-10-CM | POA: Diagnosis not present

## 2022-05-30 DIAGNOSIS — D2262 Melanocytic nevi of left upper limb, including shoulder: Secondary | ICD-10-CM | POA: Diagnosis not present

## 2022-05-30 DIAGNOSIS — D0439 Carcinoma in situ of skin of other parts of face: Secondary | ICD-10-CM | POA: Diagnosis not present

## 2022-05-30 DIAGNOSIS — D2272 Melanocytic nevi of left lower limb, including hip: Secondary | ICD-10-CM | POA: Diagnosis not present

## 2022-05-30 DIAGNOSIS — D2261 Melanocytic nevi of right upper limb, including shoulder: Secondary | ICD-10-CM | POA: Diagnosis not present

## 2022-05-30 DIAGNOSIS — D2271 Melanocytic nevi of right lower limb, including hip: Secondary | ICD-10-CM | POA: Diagnosis not present

## 2022-05-30 DIAGNOSIS — L821 Other seborrheic keratosis: Secondary | ICD-10-CM | POA: Diagnosis not present

## 2022-05-30 DIAGNOSIS — L905 Scar conditions and fibrosis of skin: Secondary | ICD-10-CM | POA: Diagnosis not present

## 2022-05-30 DIAGNOSIS — D485 Neoplasm of uncertain behavior of skin: Secondary | ICD-10-CM | POA: Diagnosis not present

## 2022-05-30 DIAGNOSIS — Z85820 Personal history of malignant melanoma of skin: Secondary | ICD-10-CM | POA: Diagnosis not present

## 2022-05-30 DIAGNOSIS — D692 Other nonthrombocytopenic purpura: Secondary | ICD-10-CM | POA: Diagnosis not present

## 2022-06-16 ENCOUNTER — Ambulatory Visit: Payer: Medicare Other | Attending: Cardiology | Admitting: Cardiology

## 2022-06-16 ENCOUNTER — Encounter: Payer: Self-pay | Admitting: General Practice

## 2022-06-16 VITALS — BP 120/60 | HR 70 | Ht 69.0 in | Wt 151.0 lb

## 2022-06-16 DIAGNOSIS — I480 Paroxysmal atrial fibrillation: Secondary | ICD-10-CM

## 2022-06-16 DIAGNOSIS — I5042 Chronic combined systolic (congestive) and diastolic (congestive) heart failure: Secondary | ICD-10-CM | POA: Diagnosis not present

## 2022-06-16 DIAGNOSIS — I25810 Atherosclerosis of coronary artery bypass graft(s) without angina pectoris: Secondary | ICD-10-CM | POA: Diagnosis not present

## 2022-06-16 DIAGNOSIS — I739 Peripheral vascular disease, unspecified: Secondary | ICD-10-CM | POA: Diagnosis not present

## 2022-06-16 DIAGNOSIS — E785 Hyperlipidemia, unspecified: Secondary | ICD-10-CM

## 2022-06-16 DIAGNOSIS — I6523 Occlusion and stenosis of bilateral carotid arteries: Secondary | ICD-10-CM

## 2022-06-16 DIAGNOSIS — I10 Essential (primary) hypertension: Secondary | ICD-10-CM | POA: Diagnosis not present

## 2022-06-16 NOTE — Patient Instructions (Signed)
Medication Instructions:  The current medical regimen is effective;  continue present plan and medications as directed. Please refer to the Current Medication list given to you today.  *If you need a refill on your cardiac medications before your next appointment, please call your pharmacy*  Lab Work: NONE If you have labs (blood work) drawn today and your tests are completely normal, you will receive your results only by:  [place] (if you have MyChart) OR A paper copy in the mail If you have any lab test that is abnormal or we need to change your treatment, we will call you to review the results.  Testing/Procedures: NONE  Follow-Up: At [facility], you and your health needs are our priority.  As part of our continuing mission to provide you with exceptional heart care, we have created designated Provider Care Teams.  These Care Teams include your primary Cardiologist (physician) and Advanced Practice Providers (APPs -  Physician Assistants and Nurse Practitioners) who all work together to provide you with the care you need, when you need it.  We recommend signing up for the patient portal called "MyChart".  Sign up information is provided on this After Visit Summary.  MyChart is used to connect with patients for Virtual Visits (Telemedicine).  Patients are able to view lab/test results, encounter notes, upcoming appointments, etc.  Non-urgent messages can be sent to your provider as well.   To learn more about what you can do with MyChart, go to [email or web address].    Your next appointment:   6 month(s)  Provider:   Peter Martinique, MD     Other Instructions

## 2022-06-16 NOTE — Progress Notes (Signed)
Cardiology Office Note:    Date:  06/16/2022   ID:  [name], DOB 1940-08-28, MRN [id number]  PCP:  Lawerance Cruel, [place] Providers Cardiologist:  Peter Martinique, MD     Referring MD: Lawerance Cruel, MD   Chief Complaint  Patient presents with   Follow-up    Weight loss concerns, no cardiac concerns    History of Present Illness:    [name] is a 82 y.o. male with a hx of A-fib, CAD s/p redo coronary bypass in 2005 after emergent stenting of the left main, s/p SBO in [id number], chronic diastolic heart failure.  In August 2016 he had progressive claudication of his left hip and leg, Dopplers completed showed severe PAD, he underwent bilateral femoral endarterectomy and fem-fem BPG by Dr. Oneida Alar.  He was admitted in 2018 with an upper respiratory infection, CHF, newly diagnosed with A-fib, CHA2DS2-VASc score of 6, started on Eliquis, underwent DCCV on 05/05/2016.  On June 25, 2019 he was noted to be in A-fib with a slow response, he underwent DCCV on 07/09/2019 and had an early recurrence of atrial fibrillation.  He was evaluated by EP and was not felt to be a good candidate for ablation due to age and comorbidities, he was started on amiodarone, and underwent DCCV again on 12/05/2019.  February 2023 he was in A-fib, slow rate, his amiodarone was stopped.  He was evaluated by EP in May 2023 and noted to be back in sinus rhythm.  Most recently he was evaluated by Dr. Martinique on 11/11/2021 and was doing well from a cardiac perspective.  He had recently had significant weight loss and was only needing Jardiance for his diabetes.  He presents today accompanied by his wife for follow-up of his atrial fibrillation and coronary artery disease.  He has been doing well from a cardiac perspective.  He has begun to put weight back on--which was felt to be due to to combination of antidiabetic medications.  He stopped his Jardiance as he felt this was contributing to his  weight loss as well. He denies chest pain, palpitations, dyspnea, pnd, orthopnea, n, v, dizziness, syncope, edema, weight gain, or early satiety.  He denies hematochezia, hematuria, hemoptysis.  Past Medical History:  Diagnosis Date   Acute inferior myocardial infarction (HCC)    hx   Acute on chronic diastolic CHF (congestive heart failure), NYHA class 1 ([place]) 08/09/2014   Anemia    Atrial fibrillation (HCC)    BPH (benign prostatic hyperplasia)    CAD (coronary artery disease)    a. s/p CABG in 2005 with RIMA-OM, SVG-D1 and SVG-PDA b. 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"   Myocardial infarction ([place]) x2  1986, 2005   Osteoarthritis    IN FINGERS (03/31/2016)   PAD (peripheral artery disease) ([place])    Type II diabetes mellitus ([place])     Past Surgical History:  Procedure Laterality Date   CARDIAC CATHETERIZATION  12/23/2014   Procedure: Left Heart Cath and Cors/Grafts Angiography;  Surgeon: Peter M Martinique, MD;  Location: [place] CV LAB;  Service: Cardiovascular;;   CARDIOVERSION N/A 05/05/2016   Procedure: CARDIOVERSION;  Surgeon: Lelon Perla, MD;  Location: [place];  Service: Cardiovascular;  Laterality:  N/A;   CARDIOVERSION N/A 07/09/2019   Procedure: CARDIOVERSION;  Surgeon: Josue Hector, MD;  Location: [facility] ENDOSCOPY;  Service: Cardiovascular;  Laterality: N/A;   CARDIOVERSION N/A 12/05/2019   Procedure: CARDIOVERSION;  Surgeon: Satira Sark, MD;  [facility];  Service: Cardiovascular;  Laterality: N/A;   CATARACT EXTRACTION W/ INTRAOCULAR LENS  IMPLANT, BILATERAL Bilateral    CORONARY ANGIOPLASTY WITH STENT PLACEMENT     CORONARY ARTERY BYPASS GRAFT  1986; redo 2005   ; free Rima to OM, svg-diag,svg-pda   ENDARTERECTOMY FEMORAL Bilateral 12/31/2014   Procedure: BILATERAL  FEMORAL ENDARTERECTOMY;  Surgeon: Elam Dutch, MD;  Location: [place];  Service: Vascular;  Laterality: Bilateral;   FEMORAL-FEMORAL BYPASS GRAFT Bilateral 12/31/2014   Procedure: FEMORAL-FEMORAL ARTERY BYPASS GRAFT;  Surgeon: Elam Dutch, MD;  Location: [place];  Service: Vascular;  Laterality: Bilateral;   KYPHOPLASTY  02/29/2012   Procedure: KYPHOPLASTY;  Surgeon: Sinclair Ship, MD;  Location: [place];  Service: Orthopedics;  Laterality: Bilateral;  T 10 kyphoplasty   LAPAROTOMY N/A 08/02/2014   Procedure: EXPLORATORY LAPAROTOMY LYSIS OF ADHESIONS;  Surgeon: Donnie Mesa, MD;  Location: [place];  Service: General;  Laterality: N/A;   MELANOMA EXCISION     scalp   PERIPHERAL VASCULAR CATHETERIZATION N/A 12/05/2014   Procedure: Abdominal Aortogram;  Surgeon: Elam Dutch, MD;  Location: [place] CV LAB;  Service: Cardiovascular;  Laterality: N/A;   ROTATOR CUFF REPAIR Right    TONSILLECTOMY      Current Medications: Current Meds  Medication Sig   ALPRAZolam (XANAX) 1 MG tablet Take 1 mg by mouth daily as needed for anxiety.   apixaban (ELIQUIS) 2.5 MG TABS tablet Take 1 tablet (2.5 mg total) by mouth 2 (two) times daily.   furosemide (LASIX) 20 MG tablet TAKE 2 TABLETS (40 MG TOTAL) BY MOUTH DAILY. 40 MG MON, WED & FRI 20MG  ON ALL OTHER DAYS   glimepiride (AMARYL) 4 MG tablet Take 4 mg by mouth daily with breakfast.   iron polysaccharides (NIFEREX) 150 MG capsule Take 150 mg by mouth at bedtime.    losartan (COZAAR) 50 MG tablet Take 50 mg by mouth daily.   nitroGLYCERIN (NITROSTAT) 0.4 MG SL tablet PLACE 1 TABLET UNDER THE TONGUE EVERY 5 MINUTES AS NEEDED FOR CHEST PAIN.   potassium chloride (KLOR-CON) 10 MEQ tablet TAKE 1 TABLET BY MOUTH EVERY DAY   rosuvastatin (CRESTOR) 10 MG tablet Take 1 tablet (10 mg total) by mouth daily.     Allergies:   Hctz [hydrochlorothiazide] and Glipizide   Social History   Socioeconomic History   Marital status: Married    Spouse name:  Not on file   Number of children: 2   Years of education: Not on file   Highest education level: Not on file  Occupational History   Occupation: insurance    Employer: RETIRED  Tobacco Use   Smoking status: Former    Packs/day: 2.00    Years: 25.00    Additional pack years: 0.00    Total pack years: 50.00    Types: Cigarettes    Quit date: 02/22/1986    Years since quitting: 36.3   Smokeless tobacco: Never  Vaping Use   Vaping Use: Never used  Substance and Sexual Activity   Alcohol use: Yes    Alcohol/week: 6.0 standard drinks of alcohol    Types: 6 Cans of beer per week   Drug use: No   Sexual activity: Not Currently  Other Topics Concern   Not on file  Social History Narrative   Not on file   Social Determinants of Health   Financial Resource Strain: Not on file  Food Insecurity: Not on file  Transportation Needs: Not on file  Physical Activity: Not on file  Stress: Not on file  Social Connections: Not on file     Family History: The patient's family history includes Dementia in his mother.  ROS:   Please see the history of present illness.     All other systems reviewed and are negative.  EKGs/Labs/Other Studies Reviewed:    The following studies were reviewed today:   EKG:  EKG is  ordered today.  The ekg ordered today demonstrates atrial fibrillation, heart rate 70 bpm.  Recent Labs: 09/20/2021: Creatinine, Ser 1.40  Recent Lipid Panel    Component Value Date/Time   CHOL 105 04/02/2016 0523   TRIG 54 04/02/2016 0523   HDL 51 04/02/2016 0523   CHOLHDL 2.1 04/02/2016 0523   VLDL 11 04/02/2016 0523   LDLCALC 43 04/02/2016 0523     Risk Assessment/Calculations:    CHA2DS2-VASc Score = 6   This indicates a 9.7% annual risk of stroke. The patient's score is based upon: CHF History: 1 HTN History: 1 Diabetes History: 1 Stroke History: 0 Vascular Disease History: 1 Age Score: 2 Gender Score: 0               Physical Exam:    VS:   BP 120/60 (BP Location: Right Arm, Patient Position: Sitting, Cuff Size: Normal)   Pulse 70   Ht 5\' 9"  (1.753 m)   Wt 151 lb (68.5 kg)   SpO2 98%   BMI 22.30 kg/m     Wt Readings from Last 3 Encounters:  06/16/22 151 lb (68.5 kg)  11/11/21 132 lb 3.2 oz (60 kg)  08/04/21 158 lb 12.8 oz (72 kg)     GEN:  Well nourished, well developed in no acute distress HEENT: Normal NECK: No JVD; No carotid bruits LYMPHATICS: No lymphadenopathy CARDIAC: Irregular rhythm noted, no murmurs, rubs, gallops RESPIRATORY:  Clear to auscultation without rales, wheezing or rhonchi  ABDOMEN: Soft, non-tender, non-distended MUSCULOSKELETAL: Trace edema at sock line; No deformity  SKIN: Warm and dry NEUROLOGIC:  Alert and oriented x 3 PSYCHIATRIC:  Normal affect   ASSESSMENT:    1. PAF (paroxysmal atrial fibrillation) ([place])   2. Coronary artery disease involving coronary bypass graft of native heart without angina pectoris   3. Hyperlipidemia LDL goal <70   4. Peripheral arterial disease ([place])   5. Chronic combined systolic (congestive) and diastolic (congestive) heart failure ([place])   6. Bilateral carotid artery stenosis   7. Essential hypertension    PLAN:    In order of problems listed above:  PAF -s/p DCCV in 2018 and again in 2021 early recurrence of AF.  EKG reveals atrial fibrillation today, rate controlled at 70 bpm.  Recent CBC, BMET stable in December 2023 continue Eliquis--dose reduction based on age and creatinine of greater than 1.5 on the last 2 out of 3 readings.  CAD -s/p redo CABG in 2005, remote anterior MI, prior left main stent.  LHC in 2016 showed patent grafts.Stable with no anginal symptoms. No indication for ischemic evaluation.  Continue Crestor, nitroglycerin as needed.  Chronic combined heart failure - echo in 2018 EF 40-45%, mild MR, elevated PA pressures.  Continue losartan, Lasix.  Was previously on Jardiance but he self discontinued this due  to weight loss and rising  cost of Jardiance.  Hypertension-blood pressure today is currently well-managed at 120/60, continue current antihypertensive medication regimen.  HLD - LDL on 03/14/2022 was well-managed at 44, continue Crestor.  Carotid artery disease - carotid US on 07/20/21 was unchanged from prior imaging, R ICA 40-59%; left ICA 1-39%. Managed by [facility].   PAD -denies claudication, managed by [facility].  Disposition-follow-up with Dr. Martinique in 6 months.          Medication Adjustments/Labs and Tests Ordered: Current medicines are reviewed at length with the patient today.  Concerns regarding medicines are outlined above.  Orders Placed This Encounter  Procedures   EKG 12-Lead   No orders of the defined types were placed in this encounter.   Patient Instructions  Medication Instructions:  The current medical regimen is effective;  continue present plan and medications as directed. Please refer to the Current Medication list given to you today.  *If you need a refill on your cardiac medications before your next appointment, please call your pharmacy*  Lab Work: NONE If you have labs (blood work) drawn today and your tests are completely normal, you will receive your results only by:  [place] (if you have MyChart) OR A paper copy in the mail If you have any lab test that is abnormal or we need to change your treatment, we will call you to review the results.  Testing/Procedures: NONE  Follow-Up: At [facility], you and your health needs are our priority.  As part of our continuing mission to provide you with exceptional heart care, we have created designated Provider Care Teams.  These Care Teams include your primary Cardiologist (physician) and Advanced Practice Providers (APPs -  Physician Assistants and Nurse Practitioners) who all work together to provide you with the care you need, when you need it.  We recommend signing up for the patient portal called "MyChart".  Sign up  information is provided on this After Visit Summary.  MyChart is used to connect with patients for Virtual Visits (Telemedicine).  Patients are able to view lab/test results, encounter notes, upcoming appointments, etc.  Non-urgent messages can be sent to your provider as well.   To learn more about what you can do with MyChart, go to [email or web address].    Your next appointment:   6 month(s)  Provider:   Peter Martinique, MD     Other Instructions    Signed, Trudi Ida, NP  06/16/2022 2:28 PM    [place]

## 2022-08-17 ENCOUNTER — Ambulatory Visit: Payer: Medicare Other | Attending: Internal Medicine | Admitting: Internal Medicine

## 2022-08-17 ENCOUNTER — Encounter: Payer: Self-pay | Admitting: Internal Medicine

## 2022-08-17 VITALS — BP 130/70 | HR 92 | Ht 68.0 in | Wt 163.0 lb

## 2022-08-17 DIAGNOSIS — I4819 Other persistent atrial fibrillation: Secondary | ICD-10-CM

## 2022-08-17 NOTE — Patient Instructions (Signed)
Medication Instructions:  Your physician recommends that you continue on your current medications as directed. Please refer to the Current Medication list given to you today.  *If you need a refill on your cardiac medications before your next appointment, please call your pharmacy*   Lab Work: NONE   If you have labs (blood work) drawn today and your tests are completely normal, you will receive your results only by: MyChart [place] (if you have MyChart) OR A paper copy in the mail If you have any lab test that is abnormal or we need to change your treatment, we will call you to review the results.   Testing/Procedures: NONE    Follow-Up: At [facility], you and your health needs are our priority.  As part of our continuing mission to provide you with exceptional heart care, we have created designated Provider Care Teams.  These Care Teams include your primary Cardiologist (physician) and Advanced Practice Providers (APPs -  Physician Assistants and Nurse Practitioners) who all work together to provide you with the care you need, when you need it.  We recommend signing up for the patient portal called "MyChart".  Sign up information is provided on this After Visit Summary.  MyChart is used to connect with patients for Virtual Visits (Telemedicine).  Patients are able to view lab/test results, encounter notes, upcoming appointments, etc.  Non-urgent messages can be sent to your provider as well.   To learn more about what you can do with MyChart, go to [email or web address].    Your next appointment:   1 year(s)  Provider:   Gregg Taylor, MD    Other Instructions Thank you for choosing [facility]!    

## 2022-08-17 NOTE — Progress Notes (Signed)
HPI [name] returns today for followup. He is a pleasant 82 yo man with persistent atrial fib, s/p DCCV, chronic amiodarone therapy which was stoped, peripheral vascular disease, and HTN. No palpitations. No cough. No edema.He has reverted back to atrial fib with a controlled VR.  Allergies  Allergen Reactions   Hctz [Hydrochlorothiazide] Other (See Comments)    Hypokalemia    Glipizide     Other reaction(s): Weak     Current Outpatient Medications  Medication Sig Dispense Refill   ALPRAZolam (XANAX) 1 MG tablet Take 1 mg by mouth daily as needed for anxiety.     apixaban (ELIQUIS) 2.5 MG TABS tablet Take 1 tablet (2.5 mg total) by mouth 2 (two) times daily. 60 tablet 11   furosemide (LASIX) 20 MG tablet TAKE 2 TABLETS (40 MG TOTAL) BY MOUTH DAILY. 40 MG MON, WED & FRI 20MG  ON ALL OTHER DAYS 180 tablet 3   glimepiride (AMARYL) 4 MG tablet Take 4 mg by mouth daily with breakfast.     iron polysaccharides (NIFEREX) 150 MG capsule Take 150 mg by mouth at bedtime.      losartan (COZAAR) 50 MG tablet Take 50 mg by mouth daily.     nitroGLYCERIN (NITROSTAT) 0.4 MG SL tablet PLACE 1 TABLET UNDER THE TONGUE EVERY 5 MINUTES AS NEEDED FOR CHEST PAIN. 25 tablet 3   potassium chloride (KLOR-CON) 10 MEQ tablet TAKE 1 TABLET BY MOUTH EVERY DAY 90 tablet 3   rosuvastatin (CRESTOR) 10 MG tablet Take 1 tablet (10 mg total) by mouth daily. 90 tablet 3   No current facility-administered medications for this visit.     Past Medical History:  Diagnosis Date   Acute inferior myocardial infarction (HCC)    hx   Acute on chronic diastolic CHF (congestive heart failure), NYHA class 1 (HCC) 08/09/2014   Anemia    Atrial fibrillation (HCC)    BPH (benign prostatic hyperplasia)    CAD (coronary artery disease)    a. s/p CABG in 2005 with RIMA-OM, SVG-D1 and SVG-PDA b. 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   Past Surgical History:  Procedure Laterality Date   CARDIAC CATHETERIZATION  12/23/2014   Procedure: Left Heart Cath and Cors/Grafts Angiography;  Surgeon: Peter M Swaziland, MD;  Location: [facility] INVASIVE CV LAB;  Service: Cardiovascular;;   CARDIOVERSION N/A 05/05/2016   Procedure: CARDIOVERSION;  Surgeon: Lewayne Bunting, MD;  Location: [facility] ENDOSCOPY;  Service: Cardiovascular;  Laterality: N/A;   CARDIOVERSION N/A 07/09/2019   Procedure: CARDIOVERSION;  Surgeon: Wendall Stade, MD;  Location[facility] ENDOSCOPY;  Service: Cardiovascular;  Laterality: N/A;   CARDIOVERSION N/A 12/05/2019   Procedure: CARDIOVERSION;  Surgeon: Jonelle Sidle, MD;  [facility];  Service: Cardiovascular;  Laterality: N/A;   CATARACT EXTRACTION W/ INTRAOCULAR LENS  IMPLANT, BILATERAL Bilateral    CORONARY ANGIOPLASTY WITH STENT PLACEMENT     CORONARY ARTERY BYPASS GRAFT  1986; redo 2005   ;  free Rima to OM, svg-diag,svg-pda   ENDARTERECTOMY FEMORAL Bilateral 12/31/2014   Procedure: BILATERAL FEMORAL ENDARTERECTOMY;  Surgeon: Sherren Kerns, MD;  Location: [facility] OR;  Service: Vascular;  Laterality: Bilateral;   FEMORAL-FEMORAL BYPASS GRAFT Bilateral 12/31/2014   Procedure: FEMORAL-FEMORAL ARTERY BYPASS GRAFT;  Surgeon: Sherren Kerns, MD;  Location: [facility] OR;  Service: Vascular;  Laterality: Bilateral;   KYPHOPLASTY  02/29/2012   Procedure: KYPHOPLASTY;  Surgeon: Emilee Hero, MD;  Location: [facility] OR;  Service: Orthopedics;  Laterality: Bilateral;  T 10 kyphoplasty   LAPAROTOMY N/A 08/02/2014   Procedure: EXPLORATORY LAPAROTOMY LYSIS OF ADHESIONS;  Surgeon: Manus Rudd, MD;   Location: [facility] OR;  Service: General;  Laterality: N/A;   MELANOMA EXCISION     scalp   PERIPHERAL VASCULAR CATHETERIZATION N/A 12/05/2014   Procedure: Abdominal Aortogram;  Surgeon: Sherren Kerns, MD;  Location: [facility] INVASIVE CV LAB;  Service: Cardiovascular;  Laterality: N/A;   ROTATOR CUFF REPAIR Right    TONSILLECTOMY       Family History  Problem Relation Age of Onset   Dementia Mother      Social History   Socioeconomic History   Marital status: Married    Spouse name: Not on file   Number of children: 2   Years of education: Not on file   Highest education level: Not on file  Occupational History   Occupation: insurance    Employer: RETIRED  Tobacco Use   Smoking status: Former    Packs/day: 2.00    Years: 25.00    Additional pack years: 0.00    Total pack years: 50.00    Types: Cigarettes    Quit date: 02/22/1986    Years since quitting: 36.5   Smokeless tobacco: Never  Vaping Use   Vaping Use: Never used  Substance and Sexual Activity   Alcohol use: Yes    Alcohol/week: 6.0 standard drinks of alcohol    Types: 6 Cans of beer per week   Drug use: No   Sexual activity: Not Currently  Other Topics Concern   Not on file  Social History Narrative   Not on file   Social Determinants of Health   Financial Resource Strain: Not on file  Food Insecurity: Not on file  Transportation Needs: Not on file  Physical Activity: Not on file  Stress: Not on file  Social Connections: Not on file  Intimate Partner Violence: Not on file     BP 130/70 (BP Location: Right Arm, Patient Position: Sitting, Cuff Size: Normal)   Pulse 92   Ht 5\' 8"  (1.727 m)   Wt 163 lb (73.9 kg)   SpO2 97%   BMI 24.78 kg/m   Physical Exam:  Well appearing NAD HEENT: Unremarkable Neck:  No JVD, no thyromegally Lymphatics:  No adenopathy Back:  No CVA tenderness Lungs:  Clear with no wheezes HEART:  Regular rate rhythm, no murmurs, no rubs, no clicks Abd:  soft, positive bowel  sounds, no organomegally, no rebound, no guarding Ext:  2 plus pulses, no edema, no cyanosis, no clubbing Skin:  No rashes no nodules Neuro:  CN II through XII intact, motor grossly intact  Assess/Plan: 1. Persistent atrial fib - he has stopped amio and is now back in atrial fib. He will undergo watchful waiting. If he feels worse, we can see him back to discuss other options.  2. HTN - his bp is ok and he will continue his current meds. 3. CAD - he denies anginal  symptoms.  4. Dyslipidemia - he will continue crestor. 5. Coags - we will continue his dose of eliquis to 2.5 mg twice daily.   Sharlot Gowda Gabriel [place],MD

## 2022-08-30 ENCOUNTER — Other Ambulatory Visit: Payer: Self-pay | Admitting: *Deleted

## 2022-08-30 DIAGNOSIS — I739 Peripheral vascular disease, unspecified: Secondary | ICD-10-CM

## 2022-09-06 ENCOUNTER — Ambulatory Visit: Payer: Medicare Other | Admitting: Physician Assistant

## 2022-09-06 ENCOUNTER — Ambulatory Visit (HOSPITAL_COMMUNITY)
Admission: RE | Admit: 2022-09-06 | Discharge: 2022-09-06 | Disposition: A | Discharge status: Home / Self Care | Payer: Medicare Other | Source: Ambulatory Visit | Attending: Vascular Surgery | Admitting: Vascular Surgery

## 2022-09-06 ENCOUNTER — Ambulatory Visit (INDEPENDENT_AMBULATORY_CARE_PROVIDER_SITE_OTHER)
Admission: RE | Admit: 2022-09-06 | Discharge: 2022-09-06 | Disposition: A | Discharge status: Home / Self Care | Payer: Medicare Other | Source: Ambulatory Visit | Attending: Vascular Surgery | Admitting: Vascular Surgery

## 2022-09-06 VITALS — BP 119/48 | HR 62 | Temp 98.3°F | Resp 18 | Ht 69.0 in | Wt 161.3 lb

## 2022-09-06 DIAGNOSIS — I739 Peripheral vascular disease, unspecified: Secondary | ICD-10-CM

## 2022-09-06 DIAGNOSIS — I6523 Occlusion and stenosis of bilateral carotid arteries: Secondary | ICD-10-CM | POA: Diagnosis not present

## 2022-09-06 LAB — VAS US ABI WITH/WO TBI
Left ABI: 1
Right ABI: 0.7

## 2022-09-06 NOTE — Progress Notes (Signed)
Office Note     CC:  follow up Requesting Provider:  Daisy Floro, MD  HPI: [name] is a 82 y.o. (1940/07/03) male who presents for surveillance of PAD.  He has history of bilateral common femoral artery endarterectomies with right to left femoral to femoral bypass by Dr. Darrick Penna on 01/10/2015.  He denies any claudication, rest pain, or tissue loss of bilateral lower extremity.  He is ambulatory with a cane however has some difficulty due to instability.  He is a former smoker.  He is on Eliquis for atrial fibrillation.  He takes a statin daily.   Past Medical History:  Diagnosis Date   Acute inferior myocardial infarction (HCC)    hx   Acute on chronic diastolic CHF (congestive heart failure), NYHA class 1 (HCC) 08/09/2014   Anemia    Atrial fibrillation (HCC)    BPH (benign prostatic hyperplasia)    CAD (coronary artery disease)    a. s/p CABG in 2005 with RIMA-OM, SVG-D1 and SVG-PDA b. 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"   Myocardial infarction (HCC) x2  1986, 2005   Osteoarthritis    IN FINGERS (03/31/2016)   PAD (peripheral artery disease) (HCC)    Type II diabetes mellitus (HCC)     Past Surgical History:  Procedure Laterality Date   CARDIAC CATHETERIZATION  12/23/2014   Procedure: Left Heart Cath and Cors/Grafts Angiography;  Surgeon: Peter M Swaziland, MD;  Location: [facility] INVASIVE CV LAB;  Service: Cardiovascular;;   CARDIOVERSION N/A 05/05/2016   Procedure: CARDIOVERSION;  Surgeon: Lewayne Bunting, MD;  Location: [facility] ENDOSCOPY;  Service: Cardiovascular;  Laterality: N/A;   CARDIOVERSION N/A 07/09/2019   Procedure: CARDIOVERSION;  Surgeon: Wendall Stade, MD;  Location: [facility] ENDOSCOPY;  Service: Cardiovascular;  Laterality: N/A;   CARDIOVERSION N/A 12/05/2019   Procedure: CARDIOVERSION;  Surgeon:  Jonelle Sidle, MD;  Location: [facility] ENDO SUITE;  Service: Cardiovascular;  Laterality: N/A;   CATARACT EXTRACTION W/ INTRAOCULAR LENS  IMPLANT, BILATERAL Bilateral    CORONARY ANGIOPLASTY WITH STENT PLACEMENT     CORONARY ARTERY BYPASS GRAFT  1986; redo 2005   ; free Rima to OM, svg-diag,svg-pda   ENDARTERECTOMY FEMORAL Bilateral 12/31/2014   Procedure: BILATERAL FEMORAL ENDARTERECTOMY;  Surgeon: Sherren Kerns, MD;  Location: [facility] OR;  Service: Vascular;  Laterality: Bilateral;   FEMORAL-FEMORAL BYPASS GRAFT Bilateral 12/31/2014   Procedure: FEMORAL-FEMORAL ARTERY BYPASS GRAFT;  Surgeon: Sherren Kerns, MD;  Location: [facility] OR;  Service: Vascular;  Laterality: Bilateral;   KYPHOPLASTY  02/29/2012   Procedure: KYPHOPLASTY;  Surgeon: Emilee Hero, MD;  Location: [facility] OR;  Service: Orthopedics;  Laterality: Bilateral;  T 10 kyphoplasty   LAPAROTOMY N/A 08/02/2014   Procedure: EXPLORATORY LAPAROTOMY LYSIS OF ADHESIONS;  Surgeon: Manus Rudd, MD;  Location: [facility] OR;  Service: General;  Laterality: N/A;   MELANOMA EXCISION     scalp   PERIPHERAL VASCULAR CATHETERIZATION N/A 12/05/2014   Procedure: Abdominal Aortogram;  Surgeon: Sherren Kerns, MD;  Location: [facility] INVASIVE CV LAB;  Service: Cardiovascular;  Laterality: N/A;   ROTATOR CUFF REPAIR Right    TONSILLECTOMY      Social History   Socioeconomic History   Marital status: Married    Spouse name: Not on file  Number of children: 2   Years of education: Not on file   Highest education level: Not on file  Occupational History   Occupation: insurance    Employer: RETIRED  Tobacco Use   Smoking status: Former    Packs/day: 2.00    Years: 25.00    Additional pack years: 0.00    Total pack years: 50.00    Types: Cigarettes    Quit date: 02/22/1986    Years since quitting: 36.5   Smokeless tobacco: Never  Vaping Use   Vaping Use: Never used  Substance and Sexual Activity   Alcohol use: Yes    Alcohol/week: 6.0 standard drinks  of alcohol    Types: 6 Cans of beer per week   Drug use: No   Sexual activity: Not Currently  Other Topics Concern   Not on file  Social History Narrative   Not on file   Social Determinants of Health   Financial Resource Strain: Not on file  Food Insecurity: Not on file  Transportation Needs: Not on file  Physical Activity: Not on file  Stress: Not on file  Social Connections: Not on file  Intimate Partner Violence: Not on file    Family History  Problem Relation Age of Onset   Dementia Mother     Current Outpatient Medications  Medication Sig Dispense Refill   ALPRAZolam (XANAX) 1 MG tablet Take 1 mg by mouth daily as needed for anxiety.     apixaban (ELIQUIS) 2.5 MG TABS tablet Take 1 tablet (2.5 mg total) by mouth 2 (two) times daily. 60 tablet 11   furosemide (LASIX) 20 MG tablet TAKE 2 TABLETS (40 MG TOTAL) BY MOUTH DAILY. 40 MG MON, WED & FRI 20MG  ON ALL OTHER DAYS 180 tablet 3   glimepiride (AMARYL) 4 MG tablet Take 4 mg by mouth daily with breakfast.     iron polysaccharides (NIFEREX) 150 MG capsule Take 150 mg by mouth at bedtime.      losartan (COZAAR) 50 MG tablet Take 50 mg by mouth daily.     nitroGLYCERIN (NITROSTAT) 0.4 MG SL tablet PLACE 1 TABLET UNDER THE TONGUE EVERY 5 MINUTES AS NEEDED FOR CHEST PAIN. 25 tablet 3   potassium chloride (KLOR-CON) 10 MEQ tablet TAKE 1 TABLET BY MOUTH EVERY DAY 90 tablet 3   rosuvastatin (CRESTOR) 10 MG tablet Take 1 tablet (10 mg total) by mouth daily. 90 tablet 3   No current facility-administered medications for this visit.    Allergies  Allergen Reactions   Hctz [Hydrochlorothiazide] Other (See Comments)    Hypokalemia    Glipizide     Other reaction(s): Weak     REVIEW OF SYSTEMS:   [X]  denotes positive finding, [ ]  denotes negative finding Cardiac  Comments:  Chest pain or chest pressure:    Shortness of breath upon exertion:    Short of breath when lying flat:    Irregular heart rhythm:        Vascular     Pain in calf, thigh, or hip brought on by ambulation:    Pain in feet at night that wakes you up from your sleep:     Blood clot in your veins:    Leg swelling:         Pulmonary    Oxygen at home:    Productive cough:     Wheezing:         Neurologic    Sudden weakness in arms or legs:  Sudden numbness in arms or legs:     Sudden onset of difficulty speaking or slurred speech:    Temporary loss of vision in one eye:     Problems with dizziness:         Gastrointestinal    Blood in stool:     Vomited blood:         Genitourinary    Burning when urinating:     Blood in urine:        Psychiatric    Major depression:         Hematologic    Bleeding problems:    Problems with blood clotting too easily:        Skin    Rashes or ulcers:        Constitutional    Fever or chills:      PHYSICAL EXAMINATION:  Vitals:   09/06/22 1500  BP: (!) 119/48  Pulse: 62  Resp: 18  Temp: 98.3 F (36.8 C)  TempSrc: Temporal  SpO2: 96%  Weight: 161 lb 4.8 oz (73.2 kg)  Height: 5\' 9"  (1.753 m)    General:  WDWN in NAD; vital signs documented above Gait: Not observed HENT: WNL, normocephalic Pulmonary: normal non-labored breathing , without Rales, rhonchi,  wheezing Cardiac: regular HR Abdomen: soft, NT, no masses Skin: without rashes Vascular Exam/Pulses: brisk DP and PT by doppler bilaterally Extremities: without ischemic changes, without Gangrene , without cellulitis; without open wounds; varicose veins of left lower leg with stasis pigmentation of both lower legs and pitting edema of the ankles Musculoskeletal: no muscle wasting or atrophy  Neurologic: A&O X 3 Psychiatric:  The pt has Normal affect.   Non-Invasive Vascular Imaging:   Duplex again suggests inflow stenosis in the right iliac and right common femoral anastomosis  ABI/TBIToday's ABIToday's TBIPrevious ABIPrevious TBI  +-------+-----------+-----------+------------+------------+  Right 0.70        0.22       0.73        0.27          +-------+-----------+-----------+------------+------------+  Left  1.0        0.22       1.0         0.31          +-------+-----------+-----------+------------+------------+      ASSESSMENT/PLAN:: 82 y.o. male here for follow up for surveillance of PAD with history of right to left femoral to femoral bypass graft with bilateral common femoral artery endarterectomies  -Subjectively, the patient does not have any claudication, rest pain, or tissue loss of bilateral lower extremities.  Duplex again suggests proximal anastomosis versus inflow stenosis however CTA performed last year demonstrates widely patent inflow and anastomosis.  We will repeat duplex with ABI in 1 year.  We will also repeat carotid duplex at that time since it was not repeated this year.  He will continue his statin daily.  He knows to call/return office sooner with any questions or concerns.  I also encouraged him to continue to walk is much as possible.   Emilie Rutter, PA-C [facility] [phone]  Clinic MD:   Lenell Antu

## 2022-09-09 DIAGNOSIS — E1142 Type 2 diabetes mellitus with diabetic polyneuropathy: Secondary | ICD-10-CM | POA: Diagnosis not present

## 2022-09-09 DIAGNOSIS — I700 Atherosclerosis of aorta: Secondary | ICD-10-CM | POA: Diagnosis not present

## 2022-09-09 DIAGNOSIS — E1122 Type 2 diabetes mellitus with diabetic chronic kidney disease: Secondary | ICD-10-CM | POA: Diagnosis not present

## 2022-09-09 DIAGNOSIS — I480 Paroxysmal atrial fibrillation: Secondary | ICD-10-CM | POA: Diagnosis not present

## 2022-09-09 DIAGNOSIS — N1831 Chronic kidney disease, stage 3a: Secondary | ICD-10-CM | POA: Diagnosis not present

## 2022-09-09 DIAGNOSIS — D6869 Other thrombophilia: Secondary | ICD-10-CM | POA: Diagnosis not present

## 2022-09-09 DIAGNOSIS — E1151 Type 2 diabetes mellitus with diabetic peripheral angiopathy without gangrene: Secondary | ICD-10-CM | POA: Diagnosis not present

## 2022-09-09 DIAGNOSIS — M542 Cervicalgia: Secondary | ICD-10-CM | POA: Diagnosis not present

## 2022-09-09 DIAGNOSIS — Z6824 Body mass index (BMI) 24.0-24.9, adult: Secondary | ICD-10-CM | POA: Diagnosis not present

## 2022-09-09 DIAGNOSIS — I6523 Occlusion and stenosis of bilateral carotid arteries: Secondary | ICD-10-CM | POA: Diagnosis not present

## 2022-09-09 DIAGNOSIS — R799 Abnormal finding of blood chemistry, unspecified: Secondary | ICD-10-CM | POA: Diagnosis not present

## 2022-09-19 ENCOUNTER — Other Ambulatory Visit: Payer: Self-pay

## 2022-09-19 DIAGNOSIS — I739 Peripheral vascular disease, unspecified: Secondary | ICD-10-CM

## 2022-09-19 DIAGNOSIS — I6523 Occlusion and stenosis of bilateral carotid arteries: Secondary | ICD-10-CM

## 2022-10-04 DIAGNOSIS — H02831 Dermatochalasis of right upper eyelid: Secondary | ICD-10-CM | POA: Diagnosis not present

## 2022-10-04 DIAGNOSIS — H0102B Squamous blepharitis left eye, upper and lower eyelids: Secondary | ICD-10-CM | POA: Diagnosis not present

## 2022-10-04 DIAGNOSIS — Z961 Presence of intraocular lens: Secondary | ICD-10-CM | POA: Diagnosis not present

## 2022-10-04 DIAGNOSIS — H31091 Other chorioretinal scars, right eye: Secondary | ICD-10-CM | POA: Diagnosis not present

## 2022-10-04 DIAGNOSIS — H02834 Dermatochalasis of left upper eyelid: Secondary | ICD-10-CM | POA: Diagnosis not present

## 2022-10-04 DIAGNOSIS — H26493 Other secondary cataract, bilateral: Secondary | ICD-10-CM | POA: Diagnosis not present

## 2022-10-04 DIAGNOSIS — H353131 Nonexudative age-related macular degeneration, bilateral, early dry stage: Secondary | ICD-10-CM | POA: Diagnosis not present

## 2022-10-04 DIAGNOSIS — H0102A Squamous blepharitis right eye, upper and lower eyelids: Secondary | ICD-10-CM | POA: Diagnosis not present

## 2022-10-04 DIAGNOSIS — H5051 Esophoria: Secondary | ICD-10-CM | POA: Diagnosis not present

## 2022-10-04 DIAGNOSIS — E119 Type 2 diabetes mellitus without complications: Secondary | ICD-10-CM | POA: Diagnosis not present

## 2022-10-04 DIAGNOSIS — H10413 Chronic giant papillary conjunctivitis, bilateral: Secondary | ICD-10-CM | POA: Diagnosis not present

## 2022-10-18 ENCOUNTER — Ambulatory Visit (HOSPITAL_COMMUNITY): Payer: Medicare Other | Admitting: Physical Therapy

## 2022-10-18 ENCOUNTER — Telehealth: Payer: Self-pay | Admitting: Cardiology

## 2022-10-18 NOTE — Telephone Encounter (Signed)
He's on the wrong dose of Eliquis. Incorrect staff sent in rx last time with a year's worth of refills which is not office policy. SCr in [facility] from Dec 2023 was < 1.5 and so was SCr from June 2023. He should be on Eliquis 5mg  BID. Can give 1 week of samples but will need long term solution if he will not qualify for patient assistance. Other options are noted below, agree aspirin is not an appropriate substitute.  Xarelto: Similar to Eliquis, but it is taken just once a day. The drug company offers a program called Xarelto with Me Coverage Gap Support. It runs from April 1-December 31. Patients in the coverage gap can get Xarelto for $89 (for 30 days) or $250 (for 90 days). They can call to enroll at: 888-XARELTO [phone])  Dabigatran: Generic of Pradaxa. GoodRx coupon bypasses insurance and brings cost down to ~$70/month if insurance doesn't cover it at a better rate.  Warfarin: Generic and cheap alternative, however it requires frequent in office lab monitoring. It's less effective than the above options and has more food and drug interactions. There may be a copay associated with lab monitoring appts.

## 2022-10-18 NOTE — Telephone Encounter (Signed)
Patient calling the office for samples of medication:   1.  What medication and dosage are you requesting samples for?   apixaban (ELIQUIS) 2.5 MG TABS tablet    2.  Are you currently out of this medication? Yes   Pt c/o medication issue:  1. Name of Medication: apixaban (ELIQUIS) 2.5 MG TABS tablet   2. How are you currently taking this medication (dosage and times per day)?   Take 1 tablet (2.5 mg total) by mouth 2 (two) times daily.    3. Are you having a reaction (difficulty breathing--STAT)? No  4. What is your medication issue? Pt states that he is currently in the "donut hole" for medication. He would like to know if he is able to be changed to Aspirin instead. Please advise

## 2022-10-18 NOTE — Telephone Encounter (Signed)
Patient states he would not qualify for patient assistance. Advised asa is not an alternative but there are others and will get recommendation from provider.  He ask for samples in meantime , he is on the 2.5 mg dose and leaving town soon

## 2022-10-18 NOTE — Telephone Encounter (Signed)
Pharmacist states patient was given wrong dose and patient should be on Eliquis 5mg . Please advise and update med list and will give samples accordingly

## 2022-10-19 MED ORDER — APIXABAN 5 MG PO TABS: 5.0000 mg | ORAL_TABLET | Freq: Two times a day (BID) | ORAL | 0 refills | Status: DC

## 2022-10-19 NOTE — Telephone Encounter (Signed)
Dr Swaziland verifies patient dose of Eliquis at 5mg .  Samples pulled  accordingly and at front desk for pick up.  Application in bag as well.

## 2022-10-20 ENCOUNTER — Other Ambulatory Visit: Payer: Self-pay

## 2022-10-20 ENCOUNTER — Ambulatory Visit (HOSPITAL_COMMUNITY): Payer: Medicare Other

## 2022-10-20 ENCOUNTER — Ambulatory Visit (HOSPITAL_COMMUNITY): Payer: Medicare Other | Attending: Family Medicine

## 2022-10-20 DIAGNOSIS — M542 Cervicalgia: Secondary | ICD-10-CM | POA: Insufficient documentation

## 2022-10-20 DIAGNOSIS — R293 Abnormal posture: Secondary | ICD-10-CM | POA: Insufficient documentation

## 2022-10-20 NOTE — Therapy (Addendum)
OUTPATIENT PHYSICAL THERAPY CERVICAL EVALUATION   Patient Name: JATIN KINGRY MRN: [id number] DOB:1940-05-01, 82 y.o., male Today's Date: 10/20/2022  END OF SESSION:   10/20/22 1257  PT Visits / Re-Eval  Visit Number 1  Number of Visits 4  Date for PT Re-Evaluation 11/17/22  Authorization  Authorization Type [employer]  PT Time Calculation  PT Start Time 1300  PT Stop Time 1340  PT Time Calculation (min) 40 min  PT - End of Session  Activity Tolerance Patient tolerated treatment well  Behavior During Therapy WFL for tasks assessed/performed    Past Medical History:  Diagnosis Date   Acute inferior myocardial infarction (HCC)    hx   Acute on chronic diastolic CHF (congestive heart failure), NYHA class 1 (HCC) 08/09/2014   Anemia    Atrial fibrillation (HCC)    BPH (benign prostatic hyperplasia)    CAD (coronary artery disease)    a. s/p CABG in 2005 with RIMA-OM, SVG-D1 and SVG-PDA b. 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"   Myocardial infarction (HCC) x2  1986, 2005   Osteoarthritis    IN FINGERS (03/31/2016)   PAD (peripheral artery disease) (HCC)    Type II diabetes mellitus (HCC)    Past Surgical History:  Procedure Laterality Date   CARDIAC CATHETERIZATION  12/23/2014   Procedure: Left Heart Cath and Cors/Grafts Angiography;  Surgeon: Peter M Swaziland, MD;  Location: [facility] INVASIVE CV LAB;  Service: Cardiovascular;;   CARDIOVERSION N/A 05/05/2016   Procedure: CARDIOVERSION;  Surgeon: Lewayne Bunting, MD;  Location: [facility] ENDOSCOPY;  Service: Cardiovascular;  Laterality: N/A;   CARDIOVERSION N/A 07/09/2019   Procedure: CARDIOVERSION;  Surgeon: Wendall Stade, MD;  Location: [facility] ENDOSCOPY;  Service: Cardiovascular;  Laterality: N/A;   CARDIOVERSION N/A 12/05/2019   Procedure: CARDIOVERSION;  Surgeon:  Jonelle Sidle, MD;  Location: [facility] ENDO SUITE;  Service: Cardiovascular;  Laterality: N/A;   CATARACT EXTRACTION W/ INTRAOCULAR LENS  IMPLANT, BILATERAL Bilateral    CORONARY ANGIOPLASTY WITH STENT PLACEMENT     CORONARY ARTERY BYPASS GRAFT  1986; redo 2005   ; free Rima to OM, svg-diag,svg-pda   ENDARTERECTOMY FEMORAL Bilateral 12/31/2014   Procedure: BILATERAL FEMORAL ENDARTERECTOMY;  Surgeon: Sherren Kerns, MD;  Location: [facility] OR;  Service: Vascular;  Laterality: Bilateral;   FEMORAL-FEMORAL BYPASS GRAFT Bilateral 12/31/2014   Procedure: FEMORAL-FEMORAL ARTERY BYPASS GRAFT;  Surgeon: Sherren Kerns, MD;  Location: [facility] OR;  Service: Vascular;  Laterality: Bilateral;   KYPHOPLASTY  02/29/2012   Procedure: KYPHOPLASTY;  Surgeon: Emilee Hero, MD;  Location: [facility] OR;  Service: Orthopedics;  Laterality: Bilateral;  T 10 kyphoplasty   LAPAROTOMY N/A 08/02/2014   Procedure: EXPLORATORY LAPAROTOMY LYSIS OF ADHESIONS;  Surgeon: Manus Rudd, MD;  Location: [facility] OR;  Service: General;  Laterality: N/A;   MELANOMA EXCISION     scalp   PERIPHERAL VASCULAR CATHETERIZATION N/A 12/05/2014   Procedure: Abdominal Aortogram;  Surgeon: Sherren Kerns, MD;  Location: [facility] INVASIVE CV LAB;  Service: Cardiovascular;  Laterality: N/A;   ROTATOR CUFF REPAIR Right    TONSILLECTOMY     Patient Active Problem List   Diagnosis Date Noted   Persistent atrial fibrillation (HCC)    Edema of both legs 06/25/2019   Cardiomyopathy (HCC) 06/25/2019  Chronic anticoagulation 06/25/2019   Hyperlipidemia LDL goal <70    Murmur, cardiac    PAF (paroxysmal atrial fibrillation) (HCC) 03/31/2016   Atrial fibrillation with RVR (HCC) 03/31/2016   Anemia 03/19/2015   Altered blood in stool 03/19/2015   Peripheral vascular disease (HCC) 03/19/2015   Hypo-osmolality and hyponatremia 03/19/2015   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   Osteoarthritis 03/19/2015   Type 2 diabetes mellitus with diabetic  polyneuropathy (HCC) 03/19/2015   Abnormal nuclear stress test 12/23/2014   Anxiety disorder 10/22/2014   Chronic combined systolic and diastolic heart failure (HCC) 08/09/2014   CAP (community acquired pneumonia) 07/29/2014   Hypokalemia 07/29/2014   SBO (small bowel obstruction) (HCC) 07/25/2014   Partial small bowel obstruction (HCC) 07/25/2014   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   CAD (coronary artery disease)    History of acute inferior wall MI    Diabetes mellitus (HCC)    Essential hypertension    Dyslipidemia     PCP: Daisy Floro, MD  REFERRING PROVIDER: Daisy Floro, MD  REFERRING DIAG: M54.2 (ICD-10-CM) - Cervicalgia  THERAPY DIAG:  Neck pain - Plan: PT plan of care cert/re-cert  Abnormal posture  Rationale for Evaluation and Treatment: Rehabilitation  ONSET DATE: 3 months or so ago  SUBJECTIVE:                                                                                                                                                                                                         SUBJECTIVE STATEMENT: Lost a significant amount of weight and felt he lost muscle as well.  Feels he "lost muscle between the bones of my neck" Hand dominance: Right  PERTINENT HISTORY:  DM type 2  PAIN:  Are you having pain? Yes: NPRS scale: 0-3/10 Pain location: back of neck; 2 or 3 times a day Pain description: "pops" Aggravating factors: cervical flexion and extension Relieving factors: unknown  PRECAUTIONS: None      WEIGHT BEARING RESTRICTIONS: No  FALLS:  Has patient fallen in last 6 months? No  OCCUPATION: retired  PLOF: Independent  PATIENT GOALS: learn some exercises for your neck  NEXT MD VISIT: every 6 monts  OBJECTIVE:   DIAGNOSTIC FINDINGS:  None noted  PATIENT SURVEYS:  FOTO 63  COGNITION: Overall cognitive status: Within functional limits for tasks assessed  SENSATION: WFL  POSTURE: rounded  shoulders and forward head  PALPATION: Non tender on palpation   CERVICAL ROM:   Active ROM AROM (deg) eval  Flexion 37  Extension 43*  Right  lateral flexion 30  Left lateral flexion 23  Right rotation 62  Left rotation 58*  Shoulder flexion Wfl bilaterally   (Blank rows = not tested)  UPPER EXTREMITY MMT's  Active MMT's Right eval Left eval  Shoulder flexion 5 4+  Shoulder extension    Shoulder abduction 5 4+  Shoulder adduction    Shoulder extension    Shoulder internal rotation    Shoulder external rotation    Elbow flexion 5 5  Elbow extension 5 5  Wrist flexion    Wrist extension    Wrist ulnar deviation    Wrist radial deviation    Wrist pronation    Wrist supination     (Blank rows = not tested)   TODAY'S TREATMENT:                                                                                                                              DATE: 10/20/22 physical therapy evaluation and HEP instruction   PATIENT EDUCATION:  Education details: Patient educated on exam findings, POC, scope of PT, HEP, and what to expect next visit. Person educated: Patient Education method: Explanation, Demonstration, and Handouts Education comprehension: verbalized understanding, returned demonstration, verbal cues required, and tactile cues required  HOME EXERCISE PROGRAM: Access Code: [email or web address] URL: [email or web address] Date: 10/20/2022 Prepared by: [facility] - Rehab  Exercises - Seated Cervical Retraction  - 2 x daily - 7 x weekly - 1 sets - 10 reps - Seated Cervical Retraction and Extension  - 2 x daily - 7 x weekly - 1 sets - 10 reps - Seated Scapular Retraction  - 2 x daily - 7 x weekly - 2 sets - 10 reps - Scapular Retraction with Resistance  - 2 x daily - 7 x weekly - 2 sets - 10 reps - Correct Seated Posture  - 1 x daily - 7 x weekly - 1 sets - 1 reps  ASSESSMENT:  CLINICAL IMPRESSION: Patient is a 82 y.o. male who was seen today for physical  therapy evaluation and treatment for M54.2 (ICD-10-CM) - Cervicalgia.  Patient presents with pain limited deficits in UE strength, ROM, posturing, activity tolerance, gait, balance, and functional mobility with ADL. Patient is having to modify and restrict ADL as indicated by outcome measure score as well as subjective information and objective measures which is affecting overall participation. Patient will benefit from skilled physical therapy in order to improve function and reduce impairment.  OBJECTIVE IMPAIRMENTS: decreased activity tolerance, decreased mobility, decreased ROM, decreased strength, hypomobility, increased fascial restrictions, impaired perceived functional ability, impaired flexibility, and pain.   ACTIVITY LIMITATIONS: carrying, lifting, and sitting  PARTICIPATION LIMITATIONS: meal prep, cleaning, laundry, driving, and community activity  REHAB POTENTIAL: Good  CLINICAL DECISION MAKING: Stable/uncomplicated  EVALUATION COMPLEXITY: Low   GOALS: Goals reviewed with patient? No  SHORT TERM GOALS: Target date: 11/03/2022  patient will be independent with initial HEP  Baseline:  Goal status: INITIAL  2.  Patient will self report 30% improvement to improve tolerance for functional activity  Baseline:  Goal status: INITIAL   LONG TERM GOALS: Target date: 11/07/2022  Patient will be independent in self management strategies to improve quality of life and functional outcomes. Baseline:  Goal status: INITIAL  2.  Patient will self report 50% improvement to improve tolerance for functional activity  Baseline:  Goal status: INITIAL  3.  Patient will improve FOTO score by 10 points to demonstrate improved perceived functional mobility  Baseline:  Goal status: INITIAL  4.  Patient will improve cervical mobility by 20 degrees total to improve ability to scan for safety Baseline:  Goal status: INITIAL  5.  Patient will demonstrate good sitting posture without  cues for 15 min to ride in a car without neck pain Baseline:  Goal status: INITIAL    PLAN:  PT FREQUENCY: 1x/week  PT DURATION: 4 weeks  PLANNED INTERVENTIONS: Therapeutic exercises, Therapeutic activity, Neuromuscular re-education, Balance training, Gait training, Patient/Family education, Joint manipulation, Joint mobilization, Stair training, Orthotic/Fit training, DME instructions, Aquatic Therapy, Dry Needling, Electrical stimulation, Spinal manipulation, Spinal mobilization, Cryotherapy, Moist heat, Compression bandaging, scar mobilization, Splintting, Taping, Traction, Ultrasound, Ionotophoresis 4mg /ml Dexamethasone, and Manual therapy   PLAN FOR NEXT SESSION: Review HEP and goals; cervical mobility; postural strengthening; update HEP each visit as he is only 1x a week.     1:42 PM, 10/20/22 Janiylah Hannis Small Chance Karam MPT [facility] [place] [phone]

## 2022-11-01 ENCOUNTER — Ambulatory Visit (HOSPITAL_COMMUNITY): Payer: Medicare Other | Attending: Family Medicine | Admitting: Physical Therapy

## 2022-11-01 DIAGNOSIS — M542 Cervicalgia: Secondary | ICD-10-CM | POA: Diagnosis not present

## 2022-11-01 DIAGNOSIS — R293 Abnormal posture: Secondary | ICD-10-CM | POA: Insufficient documentation

## 2022-11-01 NOTE — Therapy (Addendum)
OUTPATIENT PHYSICAL THERAPY CERVICAL Treatment   Patient Name: [name] MRN: [id number] DOB:07-11-1940, 82 y.o., male Today's Date: 11/01/2022  END OF SESSION:  PT End of Session - 11/01/22 0945     Visit Number 2   Number of Visits 4    Date for PT Re-Evaluation 11/20/22    Authorization Type [employer] Medicare    PT Start Time 0906    PT Stop Time 0945    PT Time Calculation (min) 39 min    Activity Tolerance Patient tolerated treatment well    Behavior During Therapy WFL for tasks assessed/performed             Past Medical History:  Diagnosis Date   Acute inferior myocardial infarction (HCC)    hx   Acute on chronic diastolic CHF (congestive heart failure), NYHA class 1 (HCC) 08/09/2014   Anemia    Atrial fibrillation (HCC)    BPH (benign prostatic hyperplasia)    CAD (coronary artery disease)    a. s/p CABG in 2005 with RIMA-OM, SVG-D1 and SVG-PDA b. 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"   Myocardial infarction (HCC) x2  1986, 2005   Osteoarthritis    IN FINGERS (03/31/2016)   PAD (peripheral artery disease) (HCC)    Type II diabetes mellitus (HCC)    Past Surgical History:  Procedure Laterality Date   CARDIAC CATHETERIZATION  12/23/2014   Procedure: Left Heart Cath and Cors/Grafts Angiography;  Surgeon: Peter M Swaziland, MD;  [facility]: [facility] IN[facility];  Service: Cardiovascular;;   CARDIOVERSION N/A 05/05/2016   Procedure: CARDIOVERSION;  Surgeon: Lewayne Bunting, MD;  Location: [facility] ENDOSCOPY;  Service: Cardiovascular;  Laterality: N/A;   CARDIOVERSION N/A 07/09/2019   Procedure: CARDIOVERSION;  Surgeon: Wendall Stade, MD;  Location: [facility] ENDOSCOPY;  Service: Cardiovascular;  Laterality: N/A;   CARDIOVERSION N/A 12/05/2019   Procedure: CARDIOVERSION;  Surgeon: Jonelle Sidle, MD;   Location: [facility] ENDO SUITE;  Service: Cardiovascular;  Laterality: N/A;   CATARACT EXTRACTION W/ INTRAOCULAR LENS  IMPLANT, BILATERAL Bilateral    CORONARY ANGIOPLASTY WITH STENT PLACEMENT     CORONARY ARTERY BYPASS GRAFT  1986; redo 2005   ; free Rima to OM, svg-diag,svg-pda   ENDARTERECTOMY FEMORAL Bilateral 12/31/2014   Procedure: BILATERAL FEMORAL ENDARTERECTOMY;  Surgeon: Sherren Kerns, MD;  Location: [facility] OR;  Service: Vascular;  Laterality: Bilateral;   FEMORAL-FEMORAL BYPASS GRAFT Bilateral 12/31/2014   Procedure: FEMORAL-FEMORAL ARTERY BYPASS GRAFT;  Surgeon: Sherren Kerns, MD;  Location: [facility] OR;  Service: Vascular;  Laterality: Bilateral;   KYPHOPLASTY  02/29/2012   Procedure: KYPHOPLASTY;  Surgeon: Emilee Hero, MD;  Location: [facility] OR;  Service: Orthopedics;  Laterality: Bilateral;  T 10 kyphoplasty   LAPAROTOMY N/A 08/02/2014   Procedure: EXPLORATORY LAPAROTOMY LYSIS OF ADHESIONS;  Surgeon: Manus Rudd, MD;  Location: [facility] OR;  Service: General;  Laterality: N/A;   MELANOMA EXCISION     scalp   PERIPHERAL VASCULAR CATHETERIZATION N/A 12/05/2014   Procedure: Abdominal Aortogram;  Surgeon: Sherren Kerns, MD;  Location: [facility] INVASIVE CV LAB;  Service: Cardiovascular;  Laterality: N/A;   ROTATOR CUFF REPAIR Right    TONSILLECTOMY     Patient Active Problem List   Diagnosis Date Noted   Persistent atrial fibrillation (  HCC)    Edema of both legs 06/25/2019   Cardiomyopathy (HCC) 06/25/2019   Chronic anticoagulation 06/25/2019   Hyperlipidemia LDL goal <70    Murmur, cardiac    PAF (paroxysmal atrial fibrillation) (HCC) 03/31/2016   Atrial fibrillation with RVR (HCC) 03/31/2016   Anemia 03/19/2015   Altered blood in stool 03/19/2015   Peripheral vascular disease (HCC) 03/19/2015   Hypo-osmolality and hyponatremia 03/19/2015   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   Osteoarthritis 03/19/2015   Type 2 diabetes mellitus with diabetic polyneuropathy (HCC) 03/19/2015    Abnormal nuclear stress test 12/23/2014   Anxiety disorder 10/22/2014   Chronic combined systolic and diastolic heart failure (HCC) 08/09/2014   CAP (community acquired pneumonia) 07/29/2014   Hypokalemia 07/29/2014   SBO (small bowel obstruction) (HCC) 07/25/2014   Partial small bowel obstruction (HCC) 07/25/2014   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   CAD (coronary artery disease)    History of acute inferior wall MI    Diabetes mellitus (HCC)    Essential hypertension    Dyslipidemia     PCP: Daisy Floro, MD  REFERRING PROVIDER: Daisy Floro, MD  REFERRING DIAG: M54.2 (ICD-10-CM) - Cervicalgia  THERAPY DIAG:  Neck pain  Abnormal posture  Rationale for Evaluation and Treatment: Rehabilitation  ONSET DATE: 3 months or so ago  SUBJECTIVE:                                                                                                                                                                                                         SUBJECTIVE STATEMENT:  PT states that he went to the beach and so he has not really done many of his exercises.  PERTINENT HISTORY:  DM type 2  PAIN:  Are you having pain? Yes: NPRS scale: 0-3/10 Pain location: back of neck; 2 or 3 times a day Pain description: "pops" Aggravating factors: cervical flexion and extension Relieving factors: unknown  PRECAUTIONS: None      WEIGHT BEARING RESTRICTIONS: No  FALLS:  Has patient fallen in last 6 months? No  OCCUPATION: retired  PLOF: Independent  PATIENT GOALS: learn some exercises for your neck  NEXT MD VISIT: every 6 monts  OBJECTIVE:   DIAGNOSTIC FINDINGS:  None noted  PATIENT SURVEYS:  FOTO 63  COGNITION: Overall cognitive status: Within functional limits for tasks assessed  SENSATION: WFL  POSTURE: rounded shoulders and forward head  PALPATION: Non tender on palpation   CERVICAL ROM:   Active ROM AROM (deg) eval  Flexion 37   Extension 43*  Right lateral flexion 30  Left lateral flexion 23  Right rotation 62  Left rotation 58*  Shoulder flexion Wfl bilaterally   (Blank rows = not tested)  UPPER EXTREMITY MMT's  Active MMT's Right eval Left eval  Shoulder flexion 5 4+  Shoulder extension    Shoulder abduction 5 4+  Shoulder adduction    Shoulder extension    Shoulder internal rotation    Shoulder external rotation    Elbow flexion 5 5  Elbow extension 5 5  Wrist flexion    Wrist extension    Wrist ulnar deviation    Wrist radial deviation    Wrist pronation    Wrist supination     (Blank rows = not tested)   TODAY'S TREATMENT:                                                                                                                              DATE:  11/01/22 Cervical and thoracic excursions. Shoulder rolls x 10 Scapular retraction x 10 Green theraband: Rows x 10  Scapular retraction x 10   Education on the importance of posture.       10/20/22 physical therapy evaluation and HEP instruction   PATIENT EDUCATION:  Education details: Patient educated on exam findings, POC, scope of PT, HEP, and what to expect next visit. Person educated: Patient Education method: Explanation, Demonstration, and Handouts Education comprehension: verbalized understanding, returned demonstration, verbal cues required, and tactile cues required  HOME EXERCISE PROGRAM: Access Code: [email or web address] URL: [email or web address] 11/01/22:cervical and thoracic excursions   Date: 10/20/2022 Prepared by: [facility] - Rehab  Exercises - Seated Cervical Retraction  - 2 x daily - 7 x weekly - 1 sets - 10 reps - Seated Cervical Retraction and Extension  - 2 x daily - 7 x weekly - 1 sets - 10 reps - Seated Scapular Retraction  - 2 x daily - 7 x weekly - 2 sets - 10 reps - Scapular Retraction with Resistance  - 2 x daily - 7 x weekly - 2 sets - 10 reps - Correct Seated Posture  - 1 x daily - 7 x weekly - 1  sets - 1 reps  ASSESSMENT:  CLINICAL IMPRESSION: Pt evaluation and goals reviewed with patient.  Reviewed pt HEP as well as adding excursions for improved ROM.  Pt will continues to have decreased ROM, strength and postural dysfunction and will benefit from skilled PT.      Patient is a 82 y.o. male who was seen today for physical therapy evaluation and treatment for M54.2 (ICD-10-CM) - Cervicalgia.  Patient presents with pain limited deficits in UE strength, ROM, posturing, activity tolerance, gait, balance, and functional mobility with ADL. Patient is having to modify and restrict ADL as indicated by outcome measure score as well as subjective information and objective measures which is affecting overall participation. Patient will benefit from skilled physical therapy in order to improve function and reduce impairment.  OBJECTIVE IMPAIRMENTS: decreased activity tolerance, decreased mobility, decreased ROM, decreased strength, hypomobility, increased fascial restrictions, impaired perceived functional ability, impaired flexibility, and pain.   ACTIVITY LIMITATIONS: carrying, lifting, and sitting  PARTICIPATION LIMITATIONS: meal prep, cleaning, laundry, driving, and community activity  REHAB POTENTIAL: Good  CLINICAL DECISION MAKING: Stable/uncomplicated  EVALUATION COMPLEXITY: Low   GOALS: Goals reviewed with patient? No  SHORT TERM GOALS: Target date: 11/03/2022  patient will be independent with initial HEP  Baseline:  Goal status: on-going   2.  Patient will self report 30% improvement to improve tolerance for functional activity  Baseline:  Goal status:  on-going    LONG TERM GOALS: Target date: 11/07/2022  Patient will be independent in self management strategies to improve quality of life and functional outcomes. Baseline:  Goal status:  on-going   2.  Patient will self report 50% improvement to improve tolerance for functional activity  Baseline:  Goal status:   on-going   3.  Patient will improve FOTO score by 10 points to demonstrate improved perceived functional mobility  Baseline:  Goal status:  on-going   4.  Patient will improve cervical mobility by 20 degrees total to improve ability to scan for safety Baseline:  Goal status:  on-going   5.  Patient will demonstrate good sitting posture without cues for 15 min to ride in a car without neck pain Baseline:  Goal status: on-going     PLAN:  PT FREQUENCY: 1x/week  PT DURATION: 4 weeks  PLANNED INTERVENTIONS: Therapeutic exercises, Therapeutic activity, Neuromuscular re-education, Balance training, Gait training, Patient/Family education, Joint manipulation, Joint mobilization, Stair training, Orthotic/Fit training, DME instructions, Aquatic Therapy, Dry Needling, Electrical stimulation, Spinal manipulation, Spinal mobilization, Cryotherapy, Moist heat, Compression bandaging, scar mobilization, Splintting, Taping, Traction, Ultrasound, Ionotophoresis 4mg /ml Dexamethasone, and Manual therapy   PLAN FOR NEXT SESSION:  postural strengthening;  add theraband scapular retraction and isometric exercises.  update HEP each visit as he is only 1x a week.     9:45 AM, 11/01/22 Virgina Organ, PT CLT [phone]

## 2022-11-09 ENCOUNTER — Ambulatory Visit (HOSPITAL_COMMUNITY): Payer: Medicare Other | Admitting: Physical Therapy

## 2022-11-09 DIAGNOSIS — M542 Cervicalgia: Secondary | ICD-10-CM

## 2022-11-09 DIAGNOSIS — R293 Abnormal posture: Secondary | ICD-10-CM

## 2022-11-09 NOTE — Therapy (Signed)
OUTPATIENT PHYSICAL THERAPY CERVICAL Treatment   Patient Name: [name] MRN: [id number] DOB:03-09-1941, 82 y.o., male Today's Date: 11/09/2022  END OF SESSION:  PT End of Session - 11/09/22 1606     Visit Number 3    Number of Visits 4    Date for PT Re-Evaluation 11/20/22    Authorization Type [employer] Medicare    PT Start Time 1606    PT Stop Time 1645    PT Time Calculation (min) 39 min    Activity Tolerance Patient tolerated treatment well    Behavior During Therapy WFL for tasks assessed/performed                  Past Medical History:  Diagnosis Date   Acute inferior myocardial infarction (HCC)    hx   Acute on chronic diastolic CHF (congestive heart failure), NYHA class 1 (HCC) 08/09/2014   Anemia    Atrial fibrillation (HCC)    BPH (benign prostatic hyperplasia)    CAD (coronary artery disease)    a. s/p CABG in 2005 with RIMA-OM, SVG-D1 and SVG-PDA b. 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"   Myocardial infarction (HCC) x2  1986, 2005   Osteoarthritis    IN FINGERS (03/31/2016)   PAD (peripheral artery disease) (HCC)    Type II diabetes mellitus (HCC)    Past Surgical History:  Procedure Laterality Date   CARDIAC CATHETERIZATION  12/23/2014   Procedure: Left Heart Cath and Cors/Grafts Angiography;  Surgeon: Peter M Swaziland, MD;  Location: [facility] INVAS[facility] CV LAB;  Service: Cardiovascular;;   CARDIOVERSION N/A 05/05/2016   Procedure: CARDIOVERSION;  Surgeon: Lewayne Bunting, MD;  Location: [facility] ENDOSCOPY;  Service: Cardiovascular;  Laterality: N/A;   CARDIOVERSION N/A 07/09/2019   Procedure: CARDIOVERSION;  Surgeon: Wendall Stade, MD;  Location: [facility] ENDOSCOPY;  Service: Cardiovascular;  Laterality: N/A;   CARDIOVERSION N/A 12/05/2019   Procedure: CARDIOVERSION;  Surgeon: Jonelle Sidle, MD;   Location: [facility] ENDO SUITE;  Service: Cardiovascular;  Laterality: N/A;   CATARACT EXTRACTION W/ INTRAOCULAR LENS  IMPLANT, BILATERAL Bilateral    CORONARY ANGIOPLASTY WITH STENT PLACEMENT     CORONARY ARTERY BYPASS GRAFT  1986; redo 2005   ; free Rima to OM, svg-diag,svg-pda   ENDARTERECTOMY FEMORAL Bilateral 12/31/2014   Procedure: BILATERAL FEMORAL ENDARTERECTOMY;  Surgeon: Sherren Kerns, MD;  Location: [facility] OR;  Service: Vascular;  Laterality: Bilateral;   FEMORAL-FEMORAL BYPASS GRAFT Bilateral 12/31/2014   Procedure: FEMORAL-FEMORAL ARTERY BYPASS GRAFT;  Surgeon: Sherren Kerns, MD;  Location: [facility] OR;  Service: Vascular;  Laterality: Bilateral;   KYPHOPLASTY  02/29/2012   Procedure: KYPHOPLASTY;  Surgeon: Emilee Hero, MD;  Location: [facility] OR;  Service: Orthopedics;  Laterality: Bilateral;  T 10 kyphoplasty   LAPAROTOMY N/A 08/02/2014   Procedure: EXPLORATORY LAPAROTOMY LYSIS OF ADHESIONS;  Surgeon: Manus Rudd, MD;  Location: [facility] OR;  Service: General;  Laterality: N/A;   MELANOMA EXCISION     scalp   PERIPHERAL VASCULAR CATHETERIZATION N/A 12/05/2014   Procedure: Abdominal Aortogram;  Surgeon: Sherren Kerns, MD;  [facility] INVASIVE CV LAB;  Service: Cardiovascular;  Laterality: N/A;   ROTATOR CUFF REPAIR Right    TONSILLECTOMY     Patient Active Problem List   Diagnosis Date  Noted   Persistent atrial fibrillation (HCC)    Edema of both legs 06/25/2019   Cardiomyopathy (HCC) 06/25/2019   Chronic anticoagulation 06/25/2019   Hyperlipidemia LDL goal <70    Murmur, cardiac    PAF (paroxysmal atrial fibrillation) (HCC) 03/31/2016   Atrial fibrillation with RVR (HCC) 03/31/2016   Anemia 03/19/2015   Altered blood in stool 03/19/2015   Peripheral vascular disease (HCC) 03/19/2015   Hypo-osmolality and hyponatremia 03/19/2015   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   Osteoarthritis 03/19/2015   Type 2 diabetes mellitus with diabetic polyneuropathy (HCC) 03/19/2015    Abnormal nuclear stress test 12/23/2014   Anxiety disorder 10/22/2014   Chronic combined systolic and diastolic heart failure (HCC) 08/09/2014   CAP (community acquired pneumonia) 07/29/2014   Hypokalemia 07/29/2014   SBO (small bowel obstruction) (HCC) 07/25/2014   Partial small bowel obstruction (HCC) 07/25/2014   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   CAD (coronary artery disease)    History of acute inferior wall MI    Diabetes mellitus (HCC)    Essential hypertension    Dyslipidemia     PCP: Daisy Floro, MD  REFERRING PROVIDER: Daisy Floro, MD  REFERRING DIAG: M54.2 (ICD-10-CM) - Cervicalgia  THERAPY DIAG:  Neck pain  Abnormal posture  Rationale for Evaluation and Treatment: Rehabilitation  ONSET DATE: 3 months or so ago  SUBJECTIVE:                                                                                                                                                                                                         SUBJECTIVE STATEMENT:  PT states that he has been doing his exercises, he gets some pain when he looks to the left at times.   PERTINENT HISTORY:  DM type 2  PAIN:  Are you having pain? Yes: NPRS scale: 0/10 Pain location: back of neck; 2 or 3 times a day Pain description: "pops" Aggravating factors: cervical flexion and extension Relieving factors: unknown  PRECAUTIONS: None      WEIGHT BEARING RESTRICTIONS: No  FALLS:  Has patient fallen in last 6 months? No  OCCUPATION: retired  PLOF: Independent  PATIENT GOALS: learn some exercises for your neck  NEXT MD VISIT: every 6 monts  OBJECTIVE:   DIAGNOSTIC FINDINGS:  None noted  PATIENT SURVEYS:  FOTO 63  COGNITION: Overall cognitive status: Within functional limits for tasks assessed  SENSATION: WFL  POSTURE: rounded shoulders and forward head  PALPATION: Non tender on palpation   CERVICAL ROM:   Active ROM AROM (  deg) eval  11/09/22  Flexion 37 45  Extension 43* 50  Right lateral flexion 30 35  Left lateral flexion 23 35  Right rotation 62 70  Left rotation 58* 70   Shoulder flexion Wfl bilaterally    (Blank rows = not tested)  UPPER EXTREMITY MMT's  Active MMT's Right eval Left eval  Shoulder flexion 5 4+  Shoulder extension    Shoulder abduction 5 4+  Shoulder adduction    Shoulder extension    Shoulder internal rotation    Shoulder external rotation    Elbow flexion 5 5  Elbow extension 5 5  Wrist flexion    Wrist extension    Wrist ulnar deviation    Wrist radial deviation    Wrist pronation    Wrist supination     (Blank rows = not tested)   TODAY'S TREATMENT:                                                                                                                              DATE:  11/09/22 Postural exercises: thera- band Scapular retraction x 15 Rows x 15 Shoulder extension x 15 Cervical isometric exercises Trap stretch x 30" x 2 Wall push up  11/01/22 Cervical and thoracic excursions. Shoulder rolls x 10 Scapular retraction x 10 Green theraband: Rows x 10  Scapular retraction x 10   Education on the importance of posture.       10/20/22 physical therapy evaluation and HEP instruction   PATIENT EDUCATION:  Education details: Patient educated on exam findings, POC, scope of PT, HEP, and what to expect next visit. Person educated: Patient Education method: Explanation, Demonstration, and Handouts Education comprehension: verbalized understanding, returned demonstration, verbal cues required, and tactile cues required  HOME EXERCISE PROGRAM: Access Code: [email or web address] URL: [email or web address] 11/01/22:cervical and thoracic excursions   Date: 10/20/2022 Prepared by: [facility] - Rehab  Exercises - Seated Cervical Retraction  - 2 x daily - 7 x weekly - 1 sets - 10 reps - Seated Cervical Retraction and Extension  - 2 x daily - 7 x weekly - 1 sets - 10 reps -  Seated Scapular Retraction  - 2 x daily - 7 x weekly - 2 sets - 10 reps - Scapular Retraction with Resistance  - 2 x daily - 7 x weekly - 2 sets - 10 reps - Correct Seated Posture  - 1 x daily - 7 x weekly - 1 sets - 1 reps 11/09/22 - Seated Isometric Cervical Extension  - 2 x daily - 7 x weekly - 1 sets - 10 reps - 5" hold - Seated Isometric Cervical Sidebending  - 2 x daily - 7 x weekly - 1 sets - 10 reps - 5" hold ASSESSMENT:  CLINICAL IMPRESSION:  Measured cervical ROM with good gains.  ROM is wfl now.   Reviewed pt HEP as well as adding isometrics for improved strength.  Pt will continues to have decreased  strength and postural dysfunction and will benefit from skilled PT.      Patient is a 82 y.o. male who was seen today for physical therapy evaluation and treatment for M54.2 (ICD-10-CM) - Cervicalgia.  Patient presents with pain limited deficits in UE strength, ROM, posturing, activity tolerance, gait, balance, and functional mobility with ADL. Patient is having to modify and restrict ADL as indicated by outcome measure score as well as subjective information and objective measures which is affecting overall participation. Patient will benefit from skilled physical therapy in order to improve function and reduce impairment.  OBJECTIVE IMPAIRMENTS: decreased activity tolerance, decreased mobility, decreased ROM, decreased strength, hypomobility, increased fascial restrictions, impaired perceived functional ability, impaired flexibility, and pain.   ACTIVITY LIMITATIONS: carrying, lifting, and sitting  PARTICIPATION LIMITATIONS: meal prep, cleaning, laundry, driving, and community activity  REHAB POTENTIAL: Good  CLINICAL DECISION MAKING: Stable/uncomplicated  EVALUATION COMPLEXITY: Low   GOALS: Goals reviewed with patient? No  SHORT TERM GOALS: Target date: 11/03/2022  patient will be independent with initial HEP  Baseline:  Goal status: on-going   2.  Patient will self  report 30% improvement to improve tolerance for functional activity  Baseline:  Goal status:  on-going    LONG TERM GOALS: Target date: 11/07/2022  Patient will be independent in self management strategies to improve quality of life and functional outcomes. Baseline:  Goal status:  on-going   2.  Patient will self report 50% improvement to improve tolerance for functional activity  Baseline:  Goal status:  on-going   3.  Patient will improve FOTO score by 10 points to demonstrate improved perceived functional mobility  Baseline:  Goal status:  on-going   4.  Patient will improve cervical mobility by 20 degrees total to improve ability to scan for safety Baseline:  Goal status:  on-going   5.  Patient will demonstrate good sitting posture without cues for 15 min to ride in a car without neck pain Baseline:  Goal status: on-going     PLAN:  PT FREQUENCY: 1x/week  PT DURATION: 4 weeks  PLANNED INTERVENTIONS: Therapeutic exercises, Therapeutic activity, Neuromuscular re-education, Balance training, Gait training, Patient/Family education, Joint manipulation, Joint mobilization, Stair training, Orthotic/Fit training, DME instructions, Aquatic Therapy, Dry Needling, Electrical stimulation, Spinal manipulation, Spinal mobilization, Cryotherapy, Moist heat, Compression bandaging, scar mobilization, Splintting, Taping, Traction, Ultrasound, Ionotophoresis 4mg /ml Dexamethasone, and Manual therapy   PLAN FOR NEXT SESSION:  postural strengthening;  add theraband scapular retraction and isometric exercises.  update HEP each visit as he is only 1x a week.     4:46 PM, 11/09/22 Virgina Organ, PT CLT [phone]

## 2022-11-11 ENCOUNTER — Telehealth: Payer: Self-pay | Admitting: Cardiology

## 2022-11-11 MED ORDER — NITROGLYCERIN 0.4 MG SL SUBL: 0.4000 mg | SUBLINGUAL_TABLET | SUBLINGUAL | 7 refills | Status: DC | PRN

## 2022-11-11 NOTE — Telephone Encounter (Signed)
Pt's medication was sent to pt's pharmacy as requested. Confirmation recieved.  °

## 2022-11-11 NOTE — Telephone Encounter (Signed)
*  STAT* If patient is at the pharmacy, call can be transferred to refill team.   1. Which medications need to be refilled? (please list name of each medication and dose if known)  nitroGLYCERIN (NITROSTAT) 0.4 MG SL tablet    2. Would you like to learn more about the convenience, safety, & potential cost savings by using the [facility]?    3. Are you open to using the [facility] (Type [facility].  ).   4. Which pharmacy/location (including street and city if local pharmacy) is medication to be sent to? [id number] - [place], [place] - [address]    5. Do they need a 30 day or 90 day supply? 30 day  Patient states he is out.

## 2022-11-16 ENCOUNTER — Telehealth: Payer: Self-pay

## 2022-11-16 ENCOUNTER — Ambulatory Visit (HOSPITAL_COMMUNITY): Payer: Medicare Other | Admitting: Physical Therapy

## 2022-11-16 DIAGNOSIS — R293 Abnormal posture: Secondary | ICD-10-CM | POA: Diagnosis not present

## 2022-11-16 DIAGNOSIS — M542 Cervicalgia: Secondary | ICD-10-CM

## 2022-11-16 NOTE — Therapy (Signed)
OUTPATIENT PHYSICAL THERAPY CERVICAL Treatment   Patient Name: ADRIANNE HULME MRN: [id number] DOB:05-Jul-1940, 82 y.o., male Today's Date: 11/16/2022  END OF SESSION:  PT End of Session - 11/16/22 1641     Visit Number 4    Number of Visits 8    Date for PT Re-Evaluation 12/16/22    Authorization Type [employer] Medicare    PT Start Time 1602    PT Stop Time 1640    PT Time Calculation (min) 38 min    Activity Tolerance Patient tolerated treatment well    Behavior During Therapy WFL for tasks assessed/performed                   Past Medical History:  Diagnosis Date   Acute inferior myocardial infarction (HCC)    hx   Acute on chronic diastolic CHF (congestive heart failure), NYHA class 1 (HCC) 08/09/2014   Anemia    Atrial fibrillation (HCC)    BPH (benign prostatic hyperplasia)    CAD (coronary artery disease)    a. s/p CABG in 2005 with RIMA-OM, SVG-D1 and SVG-PDA b. 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"   Myocardial infarction (HCC) x2  1986, 2005   Osteoarthritis    IN FINGERS (03/31/2016)   PAD (peripheral artery disease) (HCC)    Type II diabetes mellitus (HCC)    Past Surgical History:  Procedure Laterality Date   CARDIAC CATHETERIZATION  12/23/2014   Procedure: Left Heart Cath and Cors/Grafts Angiography;  Surgeon: Peter M Swaziland, MD;  Location: [facility] INVASIVE CV LAB;  Service: Cardiovascular;;   CARDIOVERSION N/A 05/05/2016   Procedure: CARDIOVERSION;  Surgeon: Lewayne Bunting, MD;  Location: [facility] ENDOSCOPY;  Service: Cardiovascular;  Laterality: N/A;   CARDIOVERSION N/A 07/09/2019   Procedure: CARDIOVERSION;  Surgeon: Wendall Stade, MD;  Location: [facility] ENDOSCOPY;  Service: Cardiovascular;  Laterality: N/A;   CARDIOVERSION N/A 12/05/2019   Procedure: CARDIOVERSION;  Surgeon: Jonelle Sidle,  MD;  Location: [facility] ENDO SUITE;  Service: Cardiovascular;  Laterality: N/A;   CATARACT EXTRACTION W/ INTRAOCULAR LENS  IMPLANT, BILATERAL Bilateral    CORONARY ANGIOPLASTY WITH STENT PLACEMENT     CORONARY ARTERY BYPASS GRAFT  1986; redo 2005   ; free Rima to OM, svg-diag,svg-pda   ENDARTERECTOMY FEMORAL Bilateral 12/31/2014   Procedure: BILATERAL FEMORAL ENDARTERECTOMY;  Surgeon: Sherren Kerns, MD;  Location: [facility] OR;  Service: Vascular;  Laterality: Bilateral;   FEMORAL-FEMORAL BYPASS GRAFT Bilateral 12/31/2014   Procedure: FEMORAL-FEMORAL ARTERY BYPASS GRAFT;  Surgeon: Sherren Kerns, MD;  Location: [facility] OR;  Service: Vascular;  Laterality: Bilateral;   KYPHOPLASTY  02/29/2012   Procedure: KYPHOPLASTY;  Surgeon: Emilee Hero, MD;  Location: [facility] OR;  Service: Orthopedics;  Laterality: Bilateral;  T 10 kyphoplasty   LAPAROTOMY N/A 08/02/2014   Procedure: EXPLORATORY LAPAROTOMY LYSIS OF ADHESIONS;  Surgeon: Manus Rudd, MD;  Location: [facility] OR;  Service: General;  Laterality: N/A;   MELANOMA EXCISION     scalp   PERIPHERAL VASCULAR CATHETERIZATION N/A 12/05/2014   Procedure: Abdominal Aortogram;  Surgeon: Sherren Kerns, MD;  Location: [facility] INVASIVE CV LAB;  Service: Cardiovascular;  Laterality: N/A;   ROTATOR CUFF REPAIR Right    TONSILLECTOMY     Patient Active Problem List   Diagnosis  Date Noted   Persistent atrial fibrillation (HCC)    Edema of both legs 06/25/2019   Cardiomyopathy (HCC) 06/25/2019   Chronic anticoagulation 06/25/2019   Hyperlipidemia LDL goal <70    Murmur, cardiac    PAF (paroxysmal atrial fibrillation) (HCC) 03/31/2016   Atrial fibrillation with RVR (HCC) 03/31/2016   Anemia 03/19/2015   Altered blood in stool 03/19/2015   Peripheral vascular disease (HCC) 03/19/2015   Hypo-osmolality and hyponatremia 03/19/2015   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   Osteoarthritis 03/19/2015   Type 2 diabetes mellitus with diabetic polyneuropathy (HCC) 03/19/2015    Abnormal nuclear stress test 12/23/2014   Anxiety disorder 10/22/2014   Chronic combined systolic and diastolic heart failure (HCC) 08/09/2014   CAP (community acquired pneumonia) 07/29/2014   Hypokalemia 07/29/2014   SBO (small bowel obstruction) (HCC) 07/25/2014   Partial small bowel obstruction (HCC) 07/25/2014   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   CAD (coronary artery disease)    History of acute inferior wall MI    Diabetes mellitus (HCC)    Essential hypertension    Dyslipidemia     PCP: Daisy Floro, MD  REFERRING PROVIDER: Daisy Floro, MD  REFERRING DIAG: M54.2 (ICD-10-CM) - Cervicalgia  THERAPY DIAG:  Neck pain  Abnormal posture  Rationale for Evaluation and Treatment: Rehabilitation  ONSET DATE: 3 months or so ago  SUBJECTIVE:                                                                                                                                                                                                         SUBJECTIVE STATEMENT:  Mr. Lafazia states that he feels that therapy has helped but he still has pain in his neck.  He has "clicking" in his neck still there.  He still has some pain when he turns his neck.     PAIN:  Are you having pain? Yes: NPRS scale: 0/10 Pain location: back of neck; 2 or 3 times a day Pain description: "pops" Aggravating factors: cervical flexion and extension Relieving factors: unknown  PRECAUTIONS: None      WEIGHT BEARING RESTRICTIONS: No  FALLS:  Has patient fallen in last 6 months? No  OCCUPATION: retired  PLOF: Independent  PATIENT GOALS: learn some exercises for your neck  NEXT MD VISIT: every 6 monts  OBJECTIVE:   DIAGNOSTIC FINDINGS:  None noted  PATIENT SURVEYS:  FOTO 63 11/16/22: 66  COGNITION: Overall cognitive status: Within functional limits for tasks assessed  SENSATION: WFL  POSTURE: rounded shoulders and forward head  PALPATION: Non tender  on palpation   CERVICAL ROM:   Active ROM AROM (deg) eval 11/09/22 11/16/22  Flexion 37 45 55  Extension 43* 50 55  Right lateral flexion 30 35 35  Left lateral flexion 23 35 35  Right rotation 62 70 80  Left rotation 58* 70  70  Shoulder flexion Wfl bilaterally     (Blank rows = not tested)  UPPER EXTREMITY MMT's  Active MMT's Right eval Left eval Left 8/21/  Shoulder flexion 5 4+ 5-  Shoulder extension     Shoulder abduction 5 4+ 5-  Shoulder adduction     Shoulder extension     Shoulder internal rotation     Shoulder external rotation     Elbow flexion 5 5   Elbow extension 5 5   Wrist flexion     Wrist extension     Wrist ulnar deviation     Wrist radial deviation     Wrist pronation     Wrist supination      (Blank rows = not tested)   TODAY'S TREATMENT:                                                                                                                              DATE:  11/16/22 Supine: Cervical retraction x 10  Cervical isometric for extension, Rt and Lt side bend x 10 Cervical rotation x 5  Manual for jt mobs and efflurage to improve mobility and pain Sitting: AROM x 5   11/09/22 Postural exercises: thera- band Scapular retraction x 15 Rows x 15 Shoulder extension x 15 Cervical isometric exercises Trap stretch x 30" x 2 Wall push up  11/01/22 Cervical and thoracic excursions. Shoulder rolls x 10 Scapular retraction x 10 Green theraband: Rows x 10  Scapular retraction x 10   Education on the importance of posture.       10/20/22 physical therapy evaluation and HEP instruction   PATIENT EDUCATION:  Education details: Patient educated on exam findings, POC, scope of PT, HEP, and what to expect next visit. Person educated: Patient Education method: Explanation, Demonstration, and Handouts Education comprehension: verbalized understanding, returned demonstration, verbal cues required, and tactile cues required  HOME EXERCISE  PROGRAM: Access Code: [email or web address] URL: [email or web address] 11/01/22:cervical and thoracic excursions   Date: 10/20/2022 Prepared by: [facility] - Rehab  Exercises - Seated Cervical Retraction  - 2 x daily - 7 x weekly - 1 sets - 10 reps - Seated Cervical Retraction and Extension  - 2 x daily - 7 x weekly - 1 sets - 10 reps - Seated Scapular Retraction  - 2 x daily - 7 x weekly - 2 sets - 10 reps - Scapular Retraction with Resistance  - 2 x daily - 7 x weekly - 2 sets - 10 reps - Correct Seated Posture  - 1 x daily - 7 x weekly - 1 sets - 1 reps 11/09/22 - Seated Isometric  Cervical Extension  - 2 x daily - 7 x weekly - 1 sets - 10 reps - 5" hold - Seated Isometric Cervical Sidebending  - 2 x daily - 7 x weekly - 1 sets - 10 reps - 5" hold ASSESSMENT:  CLINICAL IMPRESSION:  Reviewed goals and Measured cervical ROM with continued gains.  Strength of LT shoulder slightly improved.  PT is still having discomfort in the Lt cervical area with rotation.  PT continues to have decreased  strength and postural dysfunction and will benefit from continued  skilled PT.      Patient is a 82 y.o. male who was seen today for physical therapy evaluation and treatment for M54.2 (ICD-10-CM) - Cervicalgia.  Patient presents with pain limited deficits in UE strength, ROM, posturing, activity tolerance, gait, balance, and functional mobility with ADL. Patient is having to modify and restrict ADL as indicated by outcome measure score as well as subjective information and objective measures which is affecting overall participation. Patient will benefit from skilled physical therapy in order to improve function and reduce impairment.  OBJECTIVE IMPAIRMENTS: decreased activity tolerance, decreased mobility, decreased ROM, decreased strength, hypomobility, increased fascial restrictions, impaired perceived functional ability, impaired flexibility, and pain.   ACTIVITY LIMITATIONS: carrying, lifting, and  sitting  PARTICIPATION LIMITATIONS: meal prep, cleaning, laundry, driving, and community activity  REHAB POTENTIAL: Good  CLINICAL DECISION MAKING: Stable/uncomplicated  EVALUATION COMPLEXITY: Low   GOALS: Goals reviewed with patient? No  SHORT TERM GOALS: Target date: 11/03/2022  patient will be independent with initial HEP  Baseline:  Goal status: met  2.  Patient will self report 30% improvement to improve tolerance for functional activity  Baseline:  Goal status:  partially met   LONG TERM GOALS: Target date: 11/07/2022  Patient will be independent in self management strategies to improve quality of life and functional outcomes. Baseline:  Goal status:  on-going   2.  Patient will self report 50% improvement to improve tolerance for functional activity  Baseline:  Goal status:  on-going   3.  Patient will improve FOTO score by 10 points to demonstrate improved perceived functional mobility  Baseline:  Goal status:  on-going   4.  Patient will improve cervical mobility by 20 degrees total to improve ability to scan for safety Baseline:  Goal status:  on-going   5.  Patient will demonstrate good sitting posture without cues for 15 min to ride in a car without neck pain Baseline:  Goal status: on-going     PLAN:  PT FREQUENCY: 1x/week  PT DURATION: 4 weeks requesting an additional 4 weeks for a total of 8 treatments.   PLANNED INTERVENTIONS: Therapeutic exercises, Therapeutic activity, Neuromuscular re-education, Balance training, Gait training, Patient/Family education, Joint manipulation, Joint mobilization, Stair training, Orthotic/Fit training, DME instructions, Aquatic Therapy, Dry Needling, Electrical stimulation, Spinal manipulation, Spinal mobilization, Cryotherapy, Moist heat, Compression bandaging, scar mobilization, Splintting, Taping, Traction, Ultrasound, Ionotophoresis 4mg /ml Dexamethasone, and Manual therapy   PLAN FOR NEXT SESSION:  postural  strengthening;  add theraband scapular retraction and isometric exercises.  update HEP each visit as he is only 1x a week.     4:47 PM, 11/16/22 Virgina Organ, PT CLT [phone]

## 2022-11-16 NOTE — Telephone Encounter (Signed)
Call to patient regarding samples.  Samples for Eliquis and application left at front desk on 7/23 with no pickup.  Need to verify patient taking the medication and if samples are needed

## 2022-11-16 NOTE — Telephone Encounter (Signed)
Patient states it is "Out of my league and I can't take it." He then realizes confused of what medication referring to.   Verified he is on Eliquis 5 mg Twice a day. He states the last time he filled, he was in the doughnut hole. Advised that is what the samples and application are for.  He goes on about taking the 2.5 mg and the doughnut hole again.  He is very confused.  Determined he is still taking 2.5 mg and states just got a 3 month supply. He cannot seem to understand he needs to take the 5 mg even though discussed last call and this one.  He has an appt September and will need guidance to try to get him to take correct dose

## 2022-11-23 ENCOUNTER — Ambulatory Visit (HOSPITAL_COMMUNITY): Payer: Medicare Other | Admitting: Physical Therapy

## 2022-11-23 DIAGNOSIS — R293 Abnormal posture: Secondary | ICD-10-CM

## 2022-11-23 DIAGNOSIS — M542 Cervicalgia: Secondary | ICD-10-CM | POA: Diagnosis not present

## 2022-11-23 NOTE — Therapy (Signed)
OUTPATIENT PHYSICAL THERAPY CERVICAL Treatment/Discharge   Patient Name: [name] MRN: [id number] DOB:Aug 28, 1940, 82 y.o., male Today's Date: 11/23/2022 PHYSICAL THERAPY DISCHARGE SUMMARY  Visits from Start of Care: 6  Current functional level related to goals / functional outcomes: Goals met   Remaining deficits: none   Education / Equipment: HEP   Patient agrees to discharge. Patient goals were met. Patient is being discharged due to meeting the stated rehab goals.  END OF SESSION:  PT End of Session - 11/23/22 1655     Visit Number 5    Number of Visits 5   Date for PT Re-Evaluation 12/16/22    Authorization Type [employer] Medicare    PT Start Time 1615    PT Stop Time 1655    PT Time Calculation (min) 40 min    Activity Tolerance Patient tolerated treatment well    Behavior During Therapy WFL for tasks assessed/performed                   Past Medical History:  Diagnosis Date   Acute inferior myocardial infarction (HCC)    hx   Acute on chronic diastolic CHF (congestive heart failure), NYHA class 1 (HCC) 08/09/2014   Anemia    Atrial fibrillation (HCC)    BPH (benign prostatic hyperplasia)    CAD (coronary artery disease)    a. s/p CABG in 2005 with RIMA-OM, SVG-D1 and SVG-PDA b. 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"   Myocardial infarction (HCC) x2  1986, 2005   Osteoarthritis    IN FINGERS (03/31/2016)   PAD (peripheral artery disease) (HCC)    Type II diabetes mellitus (HCC)    Past Surgical History:  Procedure Laterality Date   CARDIAC CATHETERIZATION  12/23/2014   Procedure: Left Heart Cath and Cors/Grafts Angiography;  Surgeon: Peter M Swaziland, MD;  Location: [facility] INVASIVE CV LAB;  Service: Cardiovascular;;   CARDIOVERSION N/A 05/05/2016   Procedure: CARDIOVERSION;  Surgeon:  Lewayne Bunting, MD;  Location: [facility] ENDOSCOPY;  Service: Cardiovascular;  Laterality: N/A;   CARDIOVERSION N/A 07/09/2019   Procedure: CARDIOVERSION;  Surgeon: Wendall Stade, MD;  Location: [facility] ENDOSCOPY;  Service: Cardiovascular;  Laterality: N/A;   CARDIOVERSION N/A 12/05/2019   Procedure: CARDIOVERSION;  Surgeon: Jonelle Sidle, MD;  Location: [facility] ENDO SUITE;  Service: Cardiovascular;  Laterality: N/A;   CATARACT EXTRACTION W/ INTRAOCULAR LENS  IMPLANT, BILATERAL Bilateral    CORONARY ANGIOPLASTY WITH STENT PLACEMENT     CORONARY ARTERY BYPASS GRAFT  1986; redo 2005   ; free Rima to OM, svg-diag,svg-pda   ENDARTERECTOMY FEMORAL Bilateral 12/31/2014   Procedure: BILATERAL FEMORAL ENDARTERECTOMY;  Surgeon: Sherren Kerns, MD;  Location: [facility] OR;  Service: Vascular;  Laterality: Bilateral;   FEMORAL-FEMORAL BYPASS GRAFT Bilateral 12/31/2014   Procedure: FEMORAL-FEMORAL ARTERY BYPASS GRAFT;  Surgeon: Sherren Kerns, MD;  Location: [facility] OR;  Service: Vascular;  Laterality: Bilateral;   KYPHOPLASTY  02/29/2012   Procedure: KYPHOPLASTY;  Surgeon: Emilee Hero, MD;  Location: [facility] OR;  Service: Orthopedics;  Laterality: Bilateral;  T 10 kyphoplasty   LAPAROTOMY N/A 08/02/2014   Procedure: EXPLORATORY LAPAROTOMY LYSIS OF ADHESIONS;  Surgeon: Manus Rudd, MD;  Location: [facility] OR;  Service: General;  Laterality: N/A;   MELANOMA EXCISION  scalp   PERIPHERAL VASCULAR CATHETERIZATION N/A 12/05/2014   Procedure: Abdominal Aortogram;  Surgeon: Sherren Kerns, MD;  Location: [facility] INVASIVE CV LAB;  Service: Cardiovascular;  Laterality: N/A;   ROTATOR CUFF REPAIR Right    TONSILLECTOMY     Patient Active Problem List   Diagnosis Date Noted   Persistent atrial fibrillation (HCC)    Edema of both legs 06/25/2019   Cardiomyopathy (HCC) 06/25/2019   Chronic anticoagulation 06/25/2019   Hyperlipidemia LDL goal <70    Murmur, cardiac    PAF (paroxysmal atrial fibrillation) (HCC) 03/31/2016   Atrial  fibrillation with RVR (HCC) 03/31/2016   Anemia 03/19/2015   Altered blood in stool 03/19/2015   Peripheral vascular disease (HCC) 03/19/2015   Hypo-osmolality and hyponatremia 03/19/2015   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   Osteoarthritis 03/19/2015   Type 2 diabetes mellitus with diabetic polyneuropathy (HCC) 03/19/2015   Abnormal nuclear stress test 12/23/2014   Anxiety disorder 10/22/2014   Chronic combined systolic and diastolic heart failure (HCC) 08/09/2014   CAP (community acquired pneumonia) 07/29/2014   Hypokalemia 07/29/2014   SBO (small bowel obstruction) (HCC) 07/25/2014   Partial small bowel obstruction (HCC) 07/25/2014   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   CAD (coronary artery disease)    History of acute inferior wall MI    Diabetes mellitus (HCC)    Essential hypertension    Dyslipidemia     PCP: Daisy Floro, MD  REFERRING PROVIDER: Daisy Floro, MD  REFERRING DIAG: M54.2 (ICD-10-CM) - Cervicalgia  THERAPY DIAG:  Neck pain  Abnormal posture  Rationale for Evaluation and Treatment: Rehabilitation  ONSET DATE: 3 months or so ago  SUBJECTIVE:                                                                                                                                                                                                         SUBJECTIVE STATEMENT:  PT states that he has very little if any pain .  He has been doing his exercises.    PAIN:  Are you having pain? Yes: NPRS scale: 0/10 Pain location: back of neck; 2 or 3 times a day Pain description: "pops" Aggravating factors: cervical flexion and extension Relieving factors: unknown  PRECAUTIONS: None      WEIGHT BEARING RESTRICTIONS: No  FALLS:  Has patient fallen in last 6 months? No  OCCUPATION: retired  PLOF: Independent  PATIENT GOALS: learn some exercises for your neck  NEXT MD VISIT: every 6 monts  OBJECTIVE:   DIAGNOSTIC  FINDINGS:  None noted  PATIENT SURVEYS:  FOTO 63 11/16/22: 66  COGNITION: Overall cognitive status: Within functional limits for tasks assessed  SENSATION: WFL  POSTURE: rounded shoulders and forward head  PALPATION: Non tender on palpation   CERVICAL ROM:   Active ROM AROM (deg) eval 11/09/22 11/16/22  Flexion 37 45 55  Extension 43* 50 55  Right lateral flexion 30 35 35  Left lateral flexion 23 35 35  Right rotation 62 70 80  Left rotation 58* 70  70  Shoulder flexion Wfl bilaterally     (Blank rows = not tested)  UPPER EXTREMITY MMT's  Active MMT's Right eval Left eval Left 8/21/  Shoulder flexion 5 4+ 5-  Shoulder extension     Shoulder abduction 5 4+ 5-  Shoulder adduction     Shoulder extension     Shoulder internal rotation     Shoulder external rotation     Elbow flexion 5 5   Elbow extension 5 5   Wrist flexion     Wrist extension     Wrist ulnar deviation     Wrist radial deviation     Wrist pronation     Wrist supination      (Blank rows = not tested)   TODAY'S TREATMENT:                                                                                                                              DATE:  11/23/22 Supine: Decompressive theraband exercises: Pull over, the side pull, the sash and arm rotation all x 10 with red therabandl Postural exercises scapular retraction and rows  Manual for jt mobs and efflurage to improve mobility and pain 11/16/22 Supine: Cervical retraction x 10  Cervical isometric for extension, Rt and Lt side bend x 10 Cervical rotation x 5  Manual for jt mobs and efflurage to improve mobility and pain Sitting: AROM x 5   11/09/22 Postural exercises: thera- band Scapular retraction x 15 Rows x 15 Shoulder extension x 15 Cervical isometric exercises Trap stretch x 30" x 2 Wall push up  11/01/22 Cervical and thoracic excursions. Shoulder rolls x 10 Scapular retraction x 10 Green theraband: Rows x 10   Scapular retraction x 10   Education on the importance of posture.       10/20/22 physical therapy evaluation and HEP instruction   PATIENT EDUCATION:  Education details: Patient educated on exam findings, POC, scope of PT, HEP, and what to expect next visit. Person educated: Patient Education method: Explanation, Demonstration, and Handouts Education comprehension: verbalized understanding, returned demonstration, verbal cues required, and tactile cues required  HOME EXERCISE PROGRAM: Access Code: [email or web address] URL: [email or web address] 11/01/22:cervical and thoracic excursions   Date: 10/20/2022 Prepared by: [facility] - Rehab  Exercises - Seated Cervical Retraction  - 2 x daily - 7 x weekly - 1 sets - 10 reps - Seated Cervical Retraction and Extension  - 2 x daily - 7  x weekly - 1 sets - 10 reps - Seated Scapular Retraction  - 2 x daily - 7 x weekly - 2 sets - 10 reps - Scapular Retraction with Resistance  - 2 x daily - 7 x weekly - 2 sets - 10 reps - Correct Seated Posture  - 1 x daily - 7 x weekly - 1 sets - 1 reps 11/09/22 - Seated Isometric Cervical Extension  - 2 x daily - 7 x weekly - 1 sets - 10 reps - 5" hold - Seated Isometric Cervical Sidebending  - 2 x daily - 7 x weekly - 1 sets - 10 reps - 5" hold 11/23/22 red theraband  Decompressive theraband exercises: Pull over, the side pull, the sash and arm rotation all x 10 with red therabandl Postural exercises scapular retraction and rows  ASSESSMENT:  CLINICAL IMPRESSION:  Pt has had no pain since last treatment.  He feels if he continues his HEP he will be fine and is ready for discharge.   Patient is a 82 y.o. male who was seen today for physical therapy evaluation and treatment for M54.2 (ICD-10-CM) - Cervicalgia.  Patient presents with pain limited deficits in UE strength, ROM, posturing, activity tolerance, gait, balance, and functional mobility with ADL. Patient is having to modify and restrict ADL as indicated by  outcome measure score as well as subjective information and objective measures which is affecting overall participation. Patient will benefit from skilled physical therapy in order to improve function and reduce impairment.  OBJECTIVE IMPAIRMENTS: decreased activity tolerance, decreased mobility, decreased ROM, decreased strength, hypomobility, increased fascial restrictions, impaired perceived functional ability, impaired flexibility, and pain.   ACTIVITY LIMITATIONS: carrying, lifting, and sitting  PARTICIPATION LIMITATIONS: meal prep, cleaning, laundry, driving, and community activity  REHAB POTENTIAL: Good  CLINICAL DECISION MAKING: Stable/uncomplicated  EVALUATION COMPLEXITY: Low   GOALS: Goals reviewed with patient? No  SHORT TERM GOALS: Target date: 11/03/2022  patient will be independent with initial HEP  Baseline:  Goal status: met  2.  Patient will self report 30% improvement to improve tolerance for functional activity  Baseline:  Goal status:met   LONG TERM GOALS: Target date: 11/07/2022  Patient will be independent in self management strategies to improve quality of life and functional outcomes. Baseline:  Goal status:  met  2.  Patient will self report 50% improvement to improve tolerance for functional activity  Baseline:  Goal status:  met  3.  Patient will improve FOTO score by 10 points to demonstrate improved perceived functional mobility  Baseline:  Goal status:  on-going   4.  Patient will improve cervical mobility by 20 degrees total to improve ability to scan for safety Baseline:  Goal status: met  5.  Patient will demonstrate good sitting posture without cues for 15 min to ride in a car without neck pain Baseline:  Goal status: on-going     PLAN:  PT FREQUENCY: 1x/week  PT DURATION: 4 weeks requesting an additional 4 weeks for a total of 8 treatments.   PLANNED INTERVENTIONS: Therapeutic exercises, Therapeutic activity, Neuromuscular  re-education, Balance training, Gait training, Patient/Family education, Joint manipulation, Joint mobilization, Stair training, Orthotic/Fit training, DME instructions, Aquatic Therapy, Dry Needling, Electrical stimulation, Spinal manipulation, Spinal mobilization, Cryotherapy, Moist heat, Compression bandaging, scar mobilization, Splintting, Taping, Traction, Ultrasound, Ionotophoresis 4mg /ml Dexamethasone, and Manual therapy   PLAN FOR NEXT SESSION:  postural strengthening;  add theraband scapular retraction and isometric exercises.  update HEP each visit as he is only 1x  a week.     6:15 PM, 11/23/22 Virgina Organ, PT CLT [phone]

## 2022-11-29 ENCOUNTER — Ambulatory Visit (HOSPITAL_COMMUNITY): Payer: Medicare Other | Admitting: Physical Therapy

## 2022-11-30 NOTE — Progress Notes (Unsigned)
[name] Date of Birth: [birth year] Medical Record #[id number]  History of Present Illness: [name] is seen for follow up Afib and CAD.  He has a history of coronary disease and is status post redo coronary bypass surgery in 2005 after emergent stenting of the left main. This included a free RIMA graft to the OM, SVG to diagonal, and SVG to PDA. LIMA to LAD was intact from original surgery. He has had an old anterior myocardial infarction.    In April 2016 he was admitted with SBO and underwent surgery with exploratory lap and lysis of adhesions. His post op course was complicated by marked volume overload due to IVF with over 20 lb weight gain. He was diuresed. Echo showed EF 45%.   In August 2016 he presented with progressive claudication in the left hip and leg. Dopplers showed severe PAD. Angiography was done by Dr. Darrick Penna and showed severe left iliac and bilateral common femoral disease. He had an abnormal Myoview study and cardiac cath was done and showed patent grafts.  He underwent surgery by Dr. Darrick Penna with bilateral femoral endarterectomy and fem- fem BPG.   Admit 01/04-01/07/18 w/ URI, CHF, new dx atrial fib. Rate control w/ Coreg & dig. Bradycardia w/ Cardizem. Started on Eliquis and Plavix discontinued. TSH nl, EF 40-45% CHA2DS2VASc=6 (age x 2, HTN, DM, CAD, CHF). He was anticoagulated for 4 weeks and underwent DCCV on 05/05/16.   Was seen on 06/25/2019 and was noted to be in atrial fibrillation with slow ventricular response.  His furosemide was increased to 40 mg Monday Wednesday Friday and he was instructed to take 20 mg on the other days his Lanoxin was stopped.  he underwent DCCV on  07/09/2019. Unfortunately he had early recurrence of afib. He was seen by Dr Ladona Ridgel in June. Discussed AAD therapy with Tikosyn versus amiodarone. Not felt to be a candidate for ablation due to age and multiple co morbidities. Patient decided to try amiodarone and was started on po dose 200 mg bid.  He underwent DCCV on 12/05/19.   LE arterial dopplers in April 2022 showed noncompressible vessels. > 50% stenosis in femoral graft.  Carotid dopplers showed 40-59% RICA stenosis. 1-39% on LICA. Now followed by Dr Lenell Antu. Was seen in April and there were elevated velocities noted. Subsequent CT scan done looked Ok.   When seen in February it appeared he was  in Afib with a slow response so we stopped his amiodarone. When seen back by Dr Ladona Ridgel in May he was back in NSR. Suggested that if he did have symptomatic Afib would resume amiodarone.   He reports he had a 35 lb weight loss. Was having hypoglycemic spells. Actos and glimiperide were stopped. He later stopped Jardiance as well due to weight loss and cost.   Denies any palpitations, dizziness, chest pain or SOB.    Current Outpatient Medications on File Prior to Visit  Medication Sig Dispense Refill   ALPRAZolam (XANAX) 1 MG tablet Take 1 mg by mouth daily as needed for anxiety.     apixaban (ELIQUIS) 5 MG TABS tablet Take 1 tablet (5 mg total) by mouth 2 (two) times daily. 28 tablet 0   furosemide (LASIX) 20 MG tablet TAKE 2 TABLETS (40 MG TOTAL) BY MOUTH DAILY. 40 MG MON, WED & FRI 20MG  ON ALL OTHER DAYS 180 tablet 3   glimepiride (AMARYL) 4 MG tablet Take 4 mg by mouth daily with breakfast.     iron polysaccharides (NIFEREX)  150 MG capsule Take 150 mg by mouth at bedtime.      nitroGLYCERIN (NITROSTAT) 0.4 MG SL tablet Place 1 tablet (0.4 mg total) under the tongue every 5 (five) minutes as needed for chest pain. 25 tablet 7   potassium chloride (KLOR-CON) 10 MEQ tablet TAKE 1 TABLET BY MOUTH EVERY DAY 90 tablet 3   losartan (COZAAR) 50 MG tablet Take 50 mg by mouth daily. (Patient not taking: Reported on 12/06/2022)     rosuvastatin (CRESTOR) 10 MG tablet Take 1 tablet (10 mg total) by mouth daily. 90 tablet 3   No current facility-administered medications on file prior to visit.    Allergies  Allergen Reactions   Hctz  [Hydrochlorothiazide] Other (See Comments)    Hypokalemia    Glipizide     Other reaction(s): Weak    Past Medical History:  Diagnosis Date   Acute inferior myocardial infarction (HCC)    hx   Acute on chronic diastolic CHF (congestive heart failure), NYHA class 1 (HCC) 08/09/2014   Anemia    Atrial fibrillation (HCC)    BPH (benign prostatic hyperplasia)    CAD (coronary artery disease)    a. s/p CABG in 2005 with RIMA-OM, SVG-D1 and SVG-PDA b. 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"   Myocardial infarction (HCC) x2  1986, 2005   Osteoarthritis    IN FINGERS (03/31/2016)   PAD (peripheral artery disease) (HCC)    Type II diabetes mellitus (HCC)     Past Surgical History:  Procedure Laterality Date   CARDIAC CATHETERIZATION  12/23/2014   Procedure: Left Heart Cath and Cors/Grafts Angiography;  Surgeon: Jennea Rager M [place], MD;  Location: [facility] INVASIVE CV LAB;  Service: Cardiovascular;;   CARDIOVERSION N/A 05/05/2016   Procedure: CARDIOVERSION;  Surgeon: Lewayne Bunting, MD;  Location: [facility] ENDOSCOPY;  Service: Cardiovascular;  Laterality: N/A;   CARDIOVERSION N/A 07/09/2019   Procedure: CARDIOVERSION;  Surgeon: Wendall Stade, MD;  Location: [facility] ENDOSCOPY;  Service: Cardiovascular;  Laterality: N/A;   CARDIOVERSION N/A 12/05/2019   Procedure: CARDIOVERSION;  Surgeon: Jonelle Sidle, MD;  Location: [facility] ENDO SUITE;  Service: Cardiovascular;  Laterality: N/A;   CATARACT EXTRACTION W/ INTRAOCULAR LENS  IMPLANT, BILATERAL Bilateral    CORONARY ANGIOPLASTY WITH STENT PLACEMENT     CORONARY ARTERY BYPASS GRAFT  1986; redo 2005   ; free Rima to OM, svg-diag,svg-pda   ENDARTERECTOMY FEMORAL Bilateral 12/31/2014   Procedure: BILATERAL FEMORAL ENDARTERECTOMY;  Surgeon: Sherren Kerns, MD;  Location: [facility] OR;  Service: Vascular;   Laterality: Bilateral;   FEMORAL-FEMORAL BYPASS GRAFT Bilateral 12/31/2014   Procedure: FEMORAL-FEMORAL ARTERY BYPASS GRAFT;  Surgeon: Sherren Kerns, MD;  Location: [facility] OR;  Service: Vascular;  Laterality: Bilateral;   KYPHOPLASTY  02/29/2012   Procedure: KYPHOPLASTY;  Surgeon: Emilee Hero, MD;  Location: [facility] OR;  Service: Orthopedics;  Laterality: Bilateral;  T 10 kyphoplasty   LAPAROTOMY N/A 08/02/2014   Procedure: EXPLORATORY LAPAROTOMY LYSIS OF ADHESIONS;  Surgeon: Manus Rudd, MD;  Location: [facility] OR;  Service: General;  Laterality: N/A;   MELANOMA EXCISION     scalp   PERIPHERAL VASCULAR CATHETERIZATION N/A 12/05/2014   Procedure: Abdominal Aortogram;  Surgeon: Sherren Kerns, MD;  Location: [facility] INVASIVE CV LAB;  Service: Cardiovascular;  Laterality: N/A;   ROTATOR CUFF REPAIR Right    TONSILLECTOMY      Social History   Tobacco Use  Smoking Status Former   Current packs/day: 0.00   Average packs/day: 2.0 packs/day for 25.0 years (50.0 ttl pk-yrs)   Types: Cigarettes   Start date: 02/22/1961   Quit date: 02/22/1986   Years since quitting: 36.8  Smokeless Tobacco Never    Social History   Substance and Sexual Activity  Alcohol Use Yes   Alcohol/week: 6.0 standard drinks of alcohol   Types: 6 Cans of beer per week    Family History  Problem Relation Age of Onset   Dementia Mother     Review of Systems: As noted in history of present illness.  All other systems were reviewed and are negative.  Physical Exam: BP 118/72   Pulse 78   Ht 5\' 8"  (1.727 m)   Wt 161 lb 3.2 oz (73.1 kg)   SpO2 95%   BMI 24.51 kg/m  GENERAL:  Well appearing WM in NAD HEENT:  PERRL, EOMI, sclera are clear. Oropharynx is clear. NECK:  No jugular venous distention, carotid upstroke brisk and symmetric, right carotid bruit, no thyromegaly or adenopathy LUNGS:  Clear to auscultation bilaterally CHEST:  Unremarkable HEART:  IRRR,  PMI not displaced or sustained,S1 and S2 within normal  limits, no S3, no S4: no clicks, no rubs, no murmurs ABD:  Soft, nontender. BS +, no masses or bruits. No hepatomegaly, no splenomegaly EXT:  2 + pulses throughout, no edema, no cyanosis no clubbing SKIN:  Warm and dry.  No rashes NEURO:  Alert and oriented x 3. Cranial nerves II through XII intact. PSYCH:  Cognitively intact   LABORATORY DATA: Lab Results  Component Value Date   WBC 4.9 12/03/2019   HGB 11.1 (L) 12/03/2019   HCT 32.6 (L) 12/03/2019   PLT 132 (L) 12/03/2019   GLUCOSE 105 (H) 12/03/2019   CHOL 105 04/02/2016   TRIG 54 04/02/2016   HDL 51 04/02/2016   LDLCALC 43 04/02/2016   ALT 15 (L) 12/25/2014   AST 24 12/25/2014   NA 136 12/03/2019   K 4.0 12/03/2019   CL 102 12/03/2019   CREATININE 1.40 (H) 09/20/2021   BUN 24 (H) 12/03/2019   CO2 22 12/03/2019   TSH 1.009 03/31/2016   INR 1.9 (H) 12/05/2019   HGBA1C 6.9 (H) 12/03/2019   Dated 12/09/16: cholesterol 135, triglycerides 48, HDL 59, LDL 66. A1c 6.6%. Hgb 12. CMET and TSH normal Dated 07/11/19: A1c 6.6%. BUN 23. Creatinine 1.17. otherwise CMET normal. Cholesterol 139, triglycerides 40, HDL 78, LDL 43. Dig level 0.4 Dated 02/10/20: A1c 7.3%.  Dated 08/05/20: A1c 6.5% Dated 02/17/21: cholesterol 148, triglycerides 53, HDL 90, LDL 47. Creatinine 1.64. otherwise CMET and CBC normal. A1c 6.9% Dated 08/18/21: A1c 6.5% Dated 09/10/21: creatinine 1.4. GFR 50. Hgb 12.2. otherwise CMET, CBC, TSH normal. Dated 03/14/22: cholesterol 157, triglycerides 41, HDL 103, LDL 44. Creatinine 1.28. otherwise CMET normal Dated 09/09/22 A1c 6.6%  EKG Interpretation Date/Time:  Tuesday December 06 2022 13:52:31 EDT Ventricular Rate:  78 PR Interval:    QRS Duration:  86 QT Interval:  368 QTC Calculation: 419 R Axis:   55  Text Interpretation: Atrial fibrillation with a competing junctional pacemaker Minimal voltage criteria for LVH, may be normal variant ( R in aVL ) Possible Inferior infarct , age undetermined Nonspecific ST and  T wave abnormality When compared with ECG of 05-Dec-2019 10:21, QRS duration  has decreased Borderline criteria for Inferior infarct are now Present QT has shortened Confirmed by [place], Demetrie Borge [phone]) on 12/06/2022 2:13:22 PM   ECHO: 04/02/2016 - Left ventricle: The cavity size was mildly dilated. Wall   thickness was normal. Systolic function was mildly to moderately   reduced. The estimated ejection fraction was in the range of 40%   to 45%. There is akinesis of the inferolateral myocardium. - Mitral valve: Calcified annulus. There was mild regurgitation. - Aortic valve: Indexed valve area (VTI): 1.11 cm^2/m^2. Peak velocity ratio of LVOT to aortic valve: 0.71. Mean gradient (S): 5 mm Hg. Peak gradient (S): 9 mm Hg. - Left atrium: The atrium was moderately dilated. - Pulmonary arteries: Systolic pressure was moderately increased.   PA peak pressure: 51 mm Hg (S).  Impressions:  - Akinesis of the inferolateral wall with overall mild to moderate   LV dysfunction; mild MR; moderate LAE; mild TR; moderately   elevated pulmonary pressure.   Assessment / Plan: 1. Coronary disease status post redo CABG in 2005. Remote anterior myocardial infarction. Prior left main stent. Cardiac cath in September 2016 showed patent grafts. Continue medical therapy. No significant anginal symptoms.  2. 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 He has stable class 2 symptoms. Continue current therapy with lasix, and losartan. Jardiance discontinued due to profound weight loss.  3. Atrial fibrillation s/p DCCV on 05/05/16 and again in 2021 with ERAD. Was on amiodarone but concern in Feb was his rate was slow and maybe recurrent Afib. Amiodarone was held. When seen by Dr Ladona Ridgel in May Ecg showed NSR with long PR and rate 63. He is asymptomatic. [place] Vasc score of 6. Will continue Eliquis long term. Will continue with rate control strategy now.   4. Hyperlipidemia on Zocor. Excellent control. Last LDL 44.   5. Severe  PAD- S/p bilateral femoral endarterectomy and fem-fem bypass. Stressed importance of regular aerobic walking. Seen by [facility] in April 2022 and LE arterial dopplers and carotid dopplers were stable. Repeat in April showed some increase in velocities but CTA was satisfactory.   6. DM type 2- significant weight loss and hypoglycemia. Now off  Actos and glimiperide. Also discontinued Jardiance. Last A1c 6.6%.   7. HTN   8. CKD stage 3b  I will follow up in 6 months.

## 2022-12-06 ENCOUNTER — Ambulatory Visit: Payer: Medicare Other | Attending: Cardiology | Admitting: Cardiology

## 2022-12-06 ENCOUNTER — Encounter: Payer: Self-pay | Admitting: Cardiology

## 2022-12-06 VITALS — BP 118/72 | HR 78 | Ht 68.0 in | Wt 161.2 lb

## 2022-12-06 DIAGNOSIS — I4819 Other persistent atrial fibrillation: Secondary | ICD-10-CM | POA: Diagnosis not present

## 2022-12-06 DIAGNOSIS — I5042 Chronic combined systolic (congestive) and diastolic (congestive) heart failure: Secondary | ICD-10-CM | POA: Diagnosis not present

## 2022-12-06 DIAGNOSIS — I25810 Atherosclerosis of coronary artery bypass graft(s) without angina pectoris: Secondary | ICD-10-CM

## 2022-12-06 DIAGNOSIS — I6523 Occlusion and stenosis of bilateral carotid arteries: Secondary | ICD-10-CM

## 2022-12-06 MED ORDER — APIXABAN 5 MG PO TABS: 5.0000 mg | ORAL_TABLET | Freq: Two times a day (BID) | ORAL | Status: DC

## 2022-12-06 NOTE — Patient Instructions (Signed)
Medication Instructions:  No Changes *If you need a refill on your cardiac medications before your next appointment, please call your pharmacy*   Lab Work: No Labs If you have labs (blood work) drawn today and your tests are completely normal, you will receive your results only by: MyChart Message (if you have MyChart) OR A paper copy in the mail If you have any lab test that is abnormal or we need to change your treatment, we will call you to review the results.   Testing/Procedures: No Testing   Follow-Up: At [facility], you and your health needs are our priority.  As part of our continuing mission to provide you with exceptional heart care, we have created designated Provider Care Teams.  These Care Teams include your primary Cardiologist (physician) and Advanced Practice Providers (APPs -  Physician Assistants and Nurse Practitioners) who all work together to provide you with the care you need, when you need it.  We recommend signing up for the patient portal called "MyChart".  Sign up information is provided on this After Visit Summary.  MyChart is used to connect with patients for Virtual Visits (Telemedicine).  Patients are able to view lab/test results, encounter notes, upcoming appointments, etc.  Non-urgent messages can be sent to your provider as well.   To learn more about what you can do with MyChart, go to [email or web address].    Your next appointment:   6 month(s)  Provider:   Peter Jordan, MD      

## 2022-12-25 ENCOUNTER — Other Ambulatory Visit: Payer: Self-pay | Admitting: Cardiology

## 2023-01-02 ENCOUNTER — Telehealth: Payer: Self-pay | Admitting: Cardiology

## 2023-01-02 NOTE — Telephone Encounter (Signed)
Call to patient and rings busy

## 2023-01-02 NOTE — Telephone Encounter (Signed)
Patient is asking that a written prescription for  apixaban (ELIQUIS) 5 MG TABS tablet , be mailed to him. Please advise

## 2023-01-03 MED ORDER — APIXABAN 5 MG PO TABS: 5.0000 mg | ORAL_TABLET | Freq: Two times a day (BID) | ORAL | 3 refills | Status: DC

## 2023-01-03 NOTE — Telephone Encounter (Signed)
Patient states that he is on 5 mg but another doctor changed him to 2.5.  He wants to stay on 5 mg.  Advised we did discuss this previously and he needs to be on the 5 mg tablet Twice a day.  He ask if we can give him a printed prescription through the mail for the 5 mg tablet please. He [facility] not [facility] to [facility]  If unable to send through  the mail then will notify patient  ( may LM if he does not answer and send Rx  [facility] [place]

## 2023-01-03 NOTE — Telephone Encounter (Signed)
Pt calling back to speak with the nurse about the phone note from yesterday. Please advise

## 2023-01-03 NOTE — Telephone Encounter (Signed)
Eliquis 5 mg 90 day prescription sent mail order pharmacy.

## 2023-01-04 NOTE — Telephone Encounter (Signed)
Had advised that we would mail his prescription,  He states understanding and will look for it next week

## 2023-01-04 NOTE — Telephone Encounter (Signed)
Patient calling for update. Please advise  

## 2023-01-05 MED ORDER — APIXABAN 5 MG PO TABS: 5.0000 mg | ORAL_TABLET | Freq: Two times a day (BID) | ORAL | 3 refills | Status: DC

## 2023-01-05 NOTE — Addendum Note (Signed)
Addended by: Neoma Laming on: 01/05/2023 04:12 PM   Modules accepted: Orders

## 2023-01-06 NOTE — Telephone Encounter (Signed)
Eliquis 5 mg written prescription mailed to patient's home.

## 2023-01-23 DIAGNOSIS — Z23 Encounter for immunization: Secondary | ICD-10-CM | POA: Diagnosis not present

## 2023-03-16 DIAGNOSIS — N1831 Chronic kidney disease, stage 3a: Secondary | ICD-10-CM | POA: Diagnosis not present

## 2023-03-16 DIAGNOSIS — I480 Paroxysmal atrial fibrillation: Secondary | ICD-10-CM | POA: Diagnosis not present

## 2023-03-16 DIAGNOSIS — D649 Anemia, unspecified: Secondary | ICD-10-CM | POA: Diagnosis not present

## 2023-03-16 DIAGNOSIS — D696 Thrombocytopenia, unspecified: Secondary | ICD-10-CM | POA: Diagnosis not present

## 2023-03-16 DIAGNOSIS — I700 Atherosclerosis of aorta: Secondary | ICD-10-CM | POA: Diagnosis not present

## 2023-03-16 DIAGNOSIS — E1142 Type 2 diabetes mellitus with diabetic polyneuropathy: Secondary | ICD-10-CM | POA: Diagnosis not present

## 2023-03-16 DIAGNOSIS — D509 Iron deficiency anemia, unspecified: Secondary | ICD-10-CM | POA: Diagnosis not present

## 2023-03-16 DIAGNOSIS — E785 Hyperlipidemia, unspecified: Secondary | ICD-10-CM | POA: Diagnosis not present

## 2023-03-16 DIAGNOSIS — Z0000 Encounter for general adult medical examination without abnormal findings: Secondary | ICD-10-CM | POA: Diagnosis not present

## 2023-03-16 DIAGNOSIS — I10 Essential (primary) hypertension: Secondary | ICD-10-CM | POA: Diagnosis not present

## 2023-03-16 DIAGNOSIS — E1151 Type 2 diabetes mellitus with diabetic peripheral angiopathy without gangrene: Secondary | ICD-10-CM | POA: Diagnosis not present

## 2023-04-12 ENCOUNTER — Other Ambulatory Visit: Payer: Self-pay | Admitting: Cardiology

## 2023-04-12 ENCOUNTER — Other Ambulatory Visit (HOSPITAL_COMMUNITY): Payer: Self-pay

## 2023-04-12 ENCOUNTER — Telehealth: Payer: Self-pay | Admitting: Cardiology

## 2023-04-12 MED ORDER — POTASSIUM CHLORIDE ER 10 MEQ PO TBCR
10.0000 meq | EXTENDED_RELEASE_TABLET | Freq: Every day | ORAL | 2 refills | Status: DC
  Filled 2023-04-12: qty 90, 90d supply, fill #0

## 2023-04-12 NOTE — Telephone Encounter (Signed)
*  STAT* If patient is at the pharmacy, call can be transferred to refill team.   1. Which medications need to be refilled? (please list name of each medication and dose if known)   potassium chloride  (KLOR-CON ) 10 MEQ tablet   2. Would you like to learn more about the convenience, safety, & potential cost savings by using the [facility]?   3. Are you open to using the [facility] (Type [facility]. ).  4. Which pharmacy/location (including street and city if local pharmacy) is medication to be sent to?  [id number] - [place], [place] - [address]   5. Do they need a 30 day or 90 day supply?   90 day  Patient stated he is almost out of this medication.

## 2023-04-14 ENCOUNTER — Telehealth: Payer: Self-pay | Admitting: Cardiology

## 2023-04-14 MED ORDER — POTASSIUM CHLORIDE ER 10 MEQ PO TBCR: 10.0000 meq | EXTENDED_RELEASE_TABLET | Freq: Every day | ORAL | 2 refills | Status: DC

## 2023-04-14 NOTE — Telephone Encounter (Signed)
Pt c/o medication issue:  1. Name of Medication: potassium chloride (KLOR-CON) 10 MEQ tablet   2. How are you currently taking this medication (dosage and times per day)? As written  3. Are you having a reaction (difficulty breathing--STAT)? no  4. What is your medication issue? Pt medication was sent to [facility] should have went to   [id number] [place], [place] - [address] Phone: [phone]  Fax: [phone]

## 2023-04-17 ENCOUNTER — Other Ambulatory Visit (HOSPITAL_COMMUNITY): Payer: Self-pay

## 2023-05-07 ENCOUNTER — Other Ambulatory Visit: Payer: Self-pay | Admitting: Cardiology

## 2023-06-11 NOTE — Progress Notes (Unsigned)
 [name] Date of Birth: Sep 14, 1940 Medical Record #[id number]  History of Present Illness: [name] is seen for follow up Afib and CAD.  He has a history of coronary disease and is status post redo coronary bypass surgery in 2005 after emergent stenting of the left main. This included a free RIMA graft to the OM, SVG to diagonal, and SVG to PDA. LIMA to LAD was intact from original surgery. He has had an old anterior myocardial infarction.    In April 2016 he was admitted with SBO and underwent surgery with exploratory lap and lysis of adhesions. His post op course was complicated by marked volume overload due to IVF with over 20 lb weight gain. He was diuresed. Echo showed EF 45%.   In August 2016 he presented with progressive claudication in the left hip and leg. Dopplers showed severe PAD. Angiography was done by Dr. Darrick Penna and showed severe left iliac and bilateral common femoral disease. He had an abnormal Myoview study and cardiac cath was done and showed patent grafts.  He underwent surgery by Dr. Darrick Penna with bilateral femoral endarterectomy and fem- fem BPG.   Admit 01/04-01/07/18 w/ URI, CHF, new dx atrial fib. Rate control w/ Coreg & dig. Bradycardia w/ Cardizem. Started on Eliquis and Plavix discontinued. TSH nl, EF 40-45% CHA2DS2VASc=6 (age x 2, HTN, DM, CAD, CHF). He was anticoagulated for 4 weeks and underwent DCCV on 05/05/16.   Was seen on 06/25/2019 and was noted to be in atrial fibrillation with slow ventricular response.  His furosemide was increased to 40 mg Monday Wednesday Friday and he was instructed to take 20 mg on the other days his Lanoxin was stopped.  he underwent DCCV on  07/09/2019. Unfortunately he had early recurrence of afib. He was seen by Dr Ladona Ridgel in June. Discussed AAD therapy with Tikosyn versus amiodarone. Not felt to be a candidate for ablation due to age and multiple co morbidities. Patient decided to try amiodarone and was started on po dose 200 mg bid.  He underwent DCCV on 12/05/19.   LE arterial dopplers in April 2022 showed noncompressible vessels. > 50% stenosis in femoral graft.  Carotid dopplers showed 40-59% RICA stenosis. 1-39% on LICA. Now followed by Dr Lenell Antu. Was seen in April and there were elevated velocities noted. Subsequent CT scan done looked Ok.   When seen in February it appeared he was  in Afib with a slow response so we stopped his amiodarone. When seen back by Dr Ladona Ridgel in May he was back in NSR. Suggested that if he did have symptomatic Afib would resume amiodarone.   He reports he had a 35 lb weight loss. Was having hypoglycemic spells. Actos and glimiperide were stopped. He later stopped Jardiance as well due to weight loss and cost. He is back on glimepiride only. He has gained some weight back.   On follow up today he has gained some weight back but not muscle strength. Noted left leg weaker than right. No leg pain. Minimal chest discomfort intermittent. No SOB. No swelling. He is noting some obstructive urinary symptoms- difficulty starting urination and incomplete emptying.    Current Outpatient Medications on File Prior to Visit  Medication Sig Dispense Refill   ALPRAZolam (XANAX) 1 MG tablet Take 1 mg by mouth daily as needed for anxiety.     apixaban (ELIQUIS) 5 MG TABS tablet Take 1 tablet (5 mg total) by mouth 2 (two) times daily.     apixaban (ELIQUIS) 5  MG TABS tablet Take 1 tablet (5 mg total) by mouth 2 (two) times daily. 180 tablet 3   furosemide (LASIX) 20 MG tablet TAKE 2 TABLETS (40 MG TOTAL) BY MOUTH ON MONDAY, WEDNESDAY & FRIDAY THEN 1 TABLET ON ALL OTHER DAYS 120 tablet 2   glimepiride (AMARYL) 4 MG tablet Take 4 mg by mouth daily with breakfast.     iron polysaccharides (NIFEREX) 150 MG capsule Take 150 mg by mouth at bedtime.      losartan (COZAAR) 50 MG tablet Take 50 mg by mouth daily.     nitroGLYCERIN (NITROSTAT) 0.4 MG SL tablet Place 1 tablet (0.4 mg total) under the tongue every 5 (five)  minutes as needed for chest pain. 25 tablet 7   potassium chloride (KLOR-CON) 10 MEQ tablet Take 1 tablet (10 mEq total) by mouth daily. 90 tablet 2   rosuvastatin (CRESTOR) 10 MG tablet Take 1 tablet (10 mg total) by mouth daily. 90 tablet 3   No current facility-administered medications on file prior to visit.    Allergies  Allergen Reactions   Hctz [Hydrochlorothiazide] Other (See Comments)    Hypokalemia    Glipizide     Other reaction(s): Weak    Past Medical History:  Diagnosis Date   Acute inferior myocardial infarction (HCC)    hx   Acute on chronic diastolic CHF (congestive heart failure), NYHA class 1 (HCC) 08/09/2014   Anemia    Atrial fibrillation (HCC)    BPH (benign prostatic hyperplasia)    CAD (coronary artery disease)    a. s/p CABG in 2005 with RIMA-OM, SVG-D1 and SVG-PDA b. 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"   Myocardial infarction (HCC) x2  1986, 2005   Osteoarthritis    IN FINGERS (03/31/2016)   PAD (peripheral artery disease) (HCC)    Type II diabetes mellitus (HCC)     Past Surgical History:  Procedure Laterality Date   CARDIAC CATHETERIZATION  12/23/2014   Procedure: Left Heart Cath and Cors/Grafts Angiography;  Surgeon: Luverna Degenhart M Swaz[place], MD;  Location: [facility] INVASIVE CV LAB;  Service: Cardiovascular;;   CARDIOVERSION N/A 05/05/2016   Procedure: CARDIOVERSION;  Surgeon: Lewayne Bunting, MD;  Location: [facility] ENDOSCOPY;  Service: Cardiovascular;  Laterality: N/A;   CARDIOVERSION N/A 07/09/2019   Procedure: CARDIOVERSION;  Surgeon: Wendall Stade, MD;  Location: [facility] ENDOSCOPY;  Service: Cardiovascular;  Laterality: N/A;   CARDIOVERSION N/A 12/05/2019   Procedure: CARDIOVERSION;  Surgeon: Jonelle Sidle, MD;  Location: [facility] ENDO SUITE;  Service: Cardiovascular;  Laterality: N/A;    CATARACT EXTRACTION W/ INTRAOCULAR LENS  IMPLANT, BILATERAL Bilateral    CORONARY ANGIOPLASTY WITH STENT PLACEMENT     CORONARY ARTERY BYPASS GRAFT  1986; redo 2005   ; free Rima to OM, svg-diag,svg-pda   ENDARTERECTOMY FEMORAL Bilateral 12/31/2014   Procedure: BILATERAL FEMORAL ENDARTERECTOMY;  Surgeon: Sherren Kerns, MD;  Location: [facility] OR;  Service: Vascular;  Laterality: Bilateral;   FEMORAL-FEMORAL BYPASS GRAFT Bilateral 12/31/2014   Procedure: FEMORAL-FEMORAL ARTERY BYPASS GRAFT;  Surgeon: Sherren Kerns, MD;  Location: [facility] OR;  Service: Vascular;  Laterality: Bilateral;   KYPHOPLASTY  02/29/2012   Procedure: KYPHOPLASTY;  Surgeon: Emilee Hero, MD;  Location: [facility] OR;  Service: Orthopedics;  Laterality: Bilateral;  T 10  kyphoplasty   LAPAROTOMY N/A 08/02/2014   Procedure: EXPLORATORY LAPAROTOMY LYSIS OF ADHESIONS;  Surgeon: Manus Rudd, MD;  Location: [facility] OR;  Service: General;  Laterality: N/A;   MELANOMA EXCISION     scalp   PERIPHERAL VASCULAR CATHETERIZATION N/A 12/05/2014   Procedure: Abdominal Aortogram;  Surgeon: Sherren Kerns, MD;  Location: [facility] INVASIVE CV LAB;  Service: Cardiovascular;  Laterality: N/A;   ROTATOR CUFF REPAIR Right    TONSILLECTOMY      Social History   Tobacco Use  Smoking Status Former   Current packs/day: 0.00   Average packs/day: 2.0 packs/day for 25.0 years (50.0 ttl pk-yrs)   Types: Cigarettes   Start date: 02/22/1961   Quit date: 02/22/1986   Years since quitting: 37.3  Smokeless Tobacco Never    Social History   Substance and Sexual Activity  Alcohol Use Yes   Alcohol/week: 6.0 standard drinks of alcohol   Types: 6 Cans of beer per week    Family History  Problem Relation Age of Onset   Dementia Mother     Review of Systems: As noted in history of present illness.  All other systems were reviewed and are negative.  Physical Exam: BP (!) 142/70 (BP Location: Left Arm, Patient Position: Sitting, Cuff Size: Normal)   Pulse  67   Ht 5\' 8"  (1.727 m)   Wt 170 lb 6.4 oz (77.3 kg)   SpO2 94%   BMI 25.91 kg/m  GENERAL:  Well appearing WM in NAD HEENT:  PERRL, EOMI, sclera are clear. Oropharynx is clear. NECK:  No jugular venous distention, carotid upstroke brisk and symmetric, right carotid bruit, no thyromegaly or adenopathy LUNGS:  Clear to auscultation bilaterally CHEST:  Unremarkable HEART:  IRRR,  PMI not displaced or sustained,S1 and S2 within normal limits, no S3, no S4: no clicks, no rubs, no murmurs ABD:  Soft, nontender. BS +, no masses or bruits. No hepatomegaly, no splenomegaly EXT:  2 + pulses throughout, no edema, no cyanosis no clubbing SKIN:  Warm and dry.  No rashes NEURO:  Alert and oriented x 3. Cranial nerves II through XII intact. PSYCH:  Cognitively intact   LABORATORY DATA: Lab Results  Component Value Date   WBC 4.9 12/03/2019   HGB 11.1 (L) 12/03/2019   HCT 32.6 (L) 12/03/2019   PLT 132 (L) 12/03/2019   GLUCOSE 105 (H) 12/03/2019   CHOL 105 04/02/2016   TRIG 54 04/02/2016   HDL 51 04/02/2016   LDLCALC 43 04/02/2016   ALT 15 (L) 12/25/2014   AST 24 12/25/2014   NA 136 12/03/2019   K 4.0 12/03/2019   CL 102 12/03/2019   CREATININE 1.40 (H) 09/20/2021   BUN 24 (H) 12/03/2019   CO2 22 12/03/2019   TSH 1.009 03/31/2016   INR 1.9 (H) 12/05/2019   HGBA1C 6.9 (H) 12/03/2019   Dated 12/09/16: cholesterol 135, triglycerides 48, HDL 59, LDL 66. A1c 6.6%. Hgb 12. CMET and TSH normal Dated 07/11/19: A1c 6.6%. BUN 23. Creatinine 1.17. otherwise CMET normal. Cholesterol 139, triglycerides 40, HDL 78, LDL 43. Dig level 0.4 Dated 02/10/20: A1c 7.3%.  Dated 08/05/20: A1c 6.5% Dated 02/17/21: cholesterol 148, triglycerides 53, HDL 90, LDL 47. Creatinine 1.64. otherwise CMET and CBC normal. A1c 6.9% Dated 08/18/21: A1c 6.5% Dated 09/10/21: creatinine 1.4. GFR 50. Hgb 12.2. otherwise CMET, CBC, TSH normal. Dated 03/14/22: cholesterol 157, triglycerides 41, HDL 103, LDL 44. Creatinine 1.28.  otherwise CMET normal Dated 09/09/22 A1c 6.6% Dated 03/16/23: A1c 7.6%,  cholesterol 130, triglycerides 55, HDL 70, LDL 49.normal CBC and CMET      ECHO: 04/02/2016 - Left ventricle: The cavity size was mildly dilated. Wall   thickness was normal. Systolic function was mildly to moderately   reduced. The estimated ejection fraction was in the range of 40%   to 45%. There is akinesis of the inferolateral myocardium. - Mitral valve: Calcified annulus. There was mild regurgitation. - Aortic valve: Indexed valve area (VTI): 1.11 cm^2/m^2. Peak velocity ratio of LVOT to aortic valve: 0.71. Mean gradient (S): 5 mm Hg. Peak gradient (S): 9 mm Hg. - Left atrium: The atrium was moderately dilated. - Pulmonary arteries: Systolic pressure was moderately increased.   PA peak pressure: 51 mm Hg (S).  Impressions:  - Akinesis of the inferolateral wall with overall mild to moderate   LV dysfunction; mild MR; moderate LAE; mild TR; moderately   elevated pulmonary pressure.   Assessment / Plan: 1. Coronary disease status post redo CABG in 2005. Remote anterior myocardial infarction. Prior left main stent. Cardiac cath in September 2016 showed patent grafts. Class 1 angina. Continue medical therapy  2. 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 He has stable class 1-2 symptoms. Not volume overloaded.  Continue current therapy with lasix, and losartan. Jardiance discontinued due to profound weight loss.  3. Atrial fibrillation s/p DCCV on 05/05/16 and again in 2021 with ERAD. Was on amiodarone but concern in Feb was his rate was slow and maybe recurrent Afib. Amiodarone was held. Subsequent follow up in NSR. He is asymptomatic. [place] Vasc score of 6. Will continue Eliquis long term. Will continue with rate control strategy now.   4. Hyperlipidemia on Zocor. Excellent control. Last LDL 49  5. Severe PAD- S/p bilateral femoral endarterectomy and fem-fem bypass. Stressed importance of regular aerobic walking.  Seen by [facility] in April 2022 and LE arterial dopplers and carotid dopplers were stable. Repeat in April showed some increase in velocities but CTA was satisfactory.   6. DM type 2- significant weight loss and hypoglycemia. Now off  Actos and Jardiance. Last A1c 7.6%.   7. HTN   8. CKD stage 3b  9. Obstructive urinary symptoms - recommend he follow up with urology  I will follow up in 6 months.

## 2023-06-16 ENCOUNTER — Encounter: Payer: Self-pay | Admitting: Cardiology

## 2023-06-16 ENCOUNTER — Ambulatory Visit: Payer: Medicare Other | Attending: Cardiology | Admitting: Cardiology

## 2023-06-16 VITALS — BP 142/70 | HR 67 | Ht 68.0 in | Wt 170.4 lb

## 2023-06-16 DIAGNOSIS — I4819 Other persistent atrial fibrillation: Secondary | ICD-10-CM | POA: Diagnosis not present

## 2023-06-16 DIAGNOSIS — I5042 Chronic combined systolic (congestive) and diastolic (congestive) heart failure: Secondary | ICD-10-CM

## 2023-06-16 DIAGNOSIS — I739 Peripheral vascular disease, unspecified: Secondary | ICD-10-CM | POA: Diagnosis not present

## 2023-06-16 DIAGNOSIS — I10 Essential (primary) hypertension: Secondary | ICD-10-CM | POA: Diagnosis not present

## 2023-06-16 DIAGNOSIS — I25810 Atherosclerosis of coronary artery bypass graft(s) without angina pectoris: Secondary | ICD-10-CM | POA: Diagnosis not present

## 2023-06-16 NOTE — Patient Instructions (Signed)
 Medication Instructions:  Continue all medications *If you need a refill on your cardiac medications before your next appointment, please call your pharmacy*   Lab Work: None ordered   Testing/Procedures: None ordered   Follow-Up: At [facility], you and your health needs are our priority.  As part of our continuing mission to provide you with exceptional heart care, we have created designated Provider Care Teams.  These Care Teams include your primary Cardiologist (physician) and Advanced Practice Providers (APPs -  Physician Assistants and Nurse Practitioners) who all work together to provide you with the care you need, when you need it.  We recommend signing up for the patient portal called "MyChart".  Sign up information is provided on this After Visit Summary.  MyChart is used to connect with patients for Virtual Visits (Telemedicine).  Patients are able to view lab/test results, encounter notes, upcoming appointments, etc.  Non-urgent messages can be sent to your provider as well.   To learn more about what you can do with MyChart, go to [email or web address].    Your next appointment:  6 months  Call in June to schedule Sept appointment     Provider:  Dr.Jordan

## 2023-07-03 DIAGNOSIS — Z85828 Personal history of other malignant neoplasm of skin: Secondary | ICD-10-CM | POA: Diagnosis not present

## 2023-07-03 DIAGNOSIS — L821 Other seborrheic keratosis: Secondary | ICD-10-CM | POA: Diagnosis not present

## 2023-07-03 DIAGNOSIS — D0439 Carcinoma in situ of skin of other parts of face: Secondary | ICD-10-CM | POA: Diagnosis not present

## 2023-07-03 DIAGNOSIS — Z85820 Personal history of malignant melanoma of skin: Secondary | ICD-10-CM | POA: Diagnosis not present

## 2023-07-03 DIAGNOSIS — L570 Actinic keratosis: Secondary | ICD-10-CM | POA: Diagnosis not present

## 2023-07-03 DIAGNOSIS — D692 Other nonthrombocytopenic purpura: Secondary | ICD-10-CM | POA: Diagnosis not present

## 2023-07-03 DIAGNOSIS — D2272 Melanocytic nevi of left lower limb, including hip: Secondary | ICD-10-CM | POA: Diagnosis not present

## 2023-07-03 DIAGNOSIS — D2261 Melanocytic nevi of right upper limb, including shoulder: Secondary | ICD-10-CM | POA: Diagnosis not present

## 2023-07-03 DIAGNOSIS — D485 Neoplasm of uncertain behavior of skin: Secondary | ICD-10-CM | POA: Diagnosis not present

## 2023-07-03 DIAGNOSIS — D2271 Melanocytic nevi of right lower limb, including hip: Secondary | ICD-10-CM | POA: Diagnosis not present

## 2023-07-03 DIAGNOSIS — D225 Melanocytic nevi of trunk: Secondary | ICD-10-CM | POA: Diagnosis not present

## 2023-07-03 DIAGNOSIS — L905 Scar conditions and fibrosis of skin: Secondary | ICD-10-CM | POA: Diagnosis not present

## 2023-07-18 ENCOUNTER — Encounter: Payer: Self-pay | Admitting: Urology

## 2023-07-18 ENCOUNTER — Ambulatory Visit: Admitting: Urology

## 2023-07-18 VITALS — BP 132/81 | HR 94

## 2023-07-18 DIAGNOSIS — N401 Enlarged prostate with lower urinary tract symptoms: Secondary | ICD-10-CM

## 2023-07-18 DIAGNOSIS — N138 Other obstructive and reflux uropathy: Secondary | ICD-10-CM

## 2023-07-18 LAB — URINALYSIS, ROUTINE W REFLEX MICROSCOPIC
Bilirubin, UA: NEGATIVE
Glucose, UA: NEGATIVE
Ketones, UA: NEGATIVE
Leukocytes,UA: NEGATIVE
Nitrite, UA: NEGATIVE
Protein,UA: NEGATIVE
RBC, UA: NEGATIVE
Specific Gravity, UA: 1.01 (ref 1.005–1.030)
Urobilinogen, Ur: 1 mg/dL (ref 0.2–1.0)
pH, UA: 6 (ref 5.0–7.5)

## 2023-07-18 LAB — BLADDER SCAN AMB NON-IMAGING: Scan Result: 81

## 2023-07-18 MED ORDER — TAMSULOSIN HCL 0.4 MG PO CAPS: 0.4000 mg | ORAL_CAPSULE | Freq: Every day | ORAL | 11 refills | Status: DC

## 2023-07-18 NOTE — Progress Notes (Signed)
 Name: [name] DOB: [birth year] MRN: [id number]  History of Present Illness: [name] is a 83 y.o. male who presents today as a new patient at [facility] [place]. All available relevant medical records have been reviewed. He is accompanied by his wife [name]. GU History includes: 1. BPH with LUTS. - He denies prior GU surgery / bladder outlet procedure(s). - No PSA results found per chart review.  Today: He reports urinary hesitancy, slow weak urinary stream, urinary urgency, frequency, nocturia x2-4, and occasional terminal dribbling. Denies urge incontinence, dysuria, gross hematuria, straining to void, or sensations of bladder incomplete emptying.   Denies significant caffeine intake.   Denies history of recent or recurrent UTI.  Denies constipation.   Medications: Current Outpatient Medications  Medication Sig Dispense Refill   tamsulosin  (FLOMAX ) 0.4 MG CAPS capsule Take 1 capsule (0.4 mg total) by mouth daily. 30 capsule 11   ALPRAZolam  (XANAX ) 1 MG tablet Take 1 mg by mouth daily as needed for anxiety.     apixaban  (ELIQUIS ) 5 MG TABS tablet Take 1 tablet (5 mg total) by mouth 2 (two) times daily. 180 tablet 3   furosemide  (LASIX ) 20 MG tablet TAKE 2 TABLETS (40 MG TOTAL) BY MOUTH ON MONDAY, WEDNESDAY & FRIDAY THEN 1 TABLET ON ALL OTHER DAYS 120 tablet 2   glimepiride  (AMARYL ) 4 MG tablet Take 4 mg by mouth daily with breakfast.     iron  polysaccharides (NIFEREX) 150 MG capsule Take 150 mg by mouth at bedtime.      losartan  (COZAAR ) 50 MG tablet Take 50 mg by mouth daily.     nitroGLYCERIN  (NITROSTAT ) 0.4 MG SL tablet Place 1 tablet (0.4 mg total) under the tongue every 5 (five) minutes as needed for chest pain. 25 tablet 7   potassium chloride  (KLOR-CON ) 10 MEQ tablet Take 1 tablet (10 mEq total) by mouth daily. 90 tablet 2   rosuvastatin  (CRESTOR ) 10 MG tablet Take 1 tablet (10 mg total) by mouth daily. 90 tablet 3   No current facility-administered  medications for this visit.    Allergies: No Active Allergies   Past Medical History:  Diagnosis Date   Acute inferior myocardial infarction (HCC)    hx   Acute on chronic diastolic CHF (congestive heart failure), NYHA class 1 (HCC) 08/09/2014   Anemia    Atrial fibrillation (HCC)    BPH (benign prostatic hyperplasia)    CAD (coronary artery disease)    a. s/p CABG in 2005 with RIMA-OM, SVG-D1 and SVG-PDA b. 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"   Myocardial infarction (HCC) x2  1986, 2005   Osteoarthritis    IN FINGERS (03/31/2016)   PAD (peripheral artery disease) (HCC)    Type II diabetes mellitus (HCC)    Past Surgical History:  Procedure Laterality Date   CARDIAC CATHETERIZATION  12/23/2014   Procedure: Left Heart Cath and Cors/Grafts Angiography;  Surgeon: Peter M Swaziland, MD;  Location: [facility] INVASIVE CV LAB;  Service: Cardiovascular;;   CARDIOVERSION N/A 05/05/2016   Procedure: CARDIOVERSION;  Surgeon: Lenise Quince, MD;  Location: [facility] ENDOSCOPY;  Service: Cardiovascular;  Laterality: N/A;   CARDIOVERSION N/A 07/09/2019   Procedure: CARDIOVERSION;  Surgeon: Loyde Rule, MD;  Location: [facility] ENDOSCOPY;  Service: Cardiovascular;  Laterality: N/A;   CARDIOVERSION N/A 12/05/2019   Procedure: CARDIOVERSION;  Surgeon: Gerard Knight, MD;  Location: [facility] ENDO SUITE;  Service: Cardiovascular;  Laterality: N/A;   CATARACT EXTRACTION W/ INTRAOCULAR LENS  IMPLANT, BILATERAL Bilateral    CORONARY ANGIOPLASTY WITH STENT PLACEMENT     CORONARY ARTERY BYPASS GRAFT  1986; redo 2005   ; free Rima to OM, svg-diag,svg-pda   ENDARTERECTOMY FEMORAL Bilateral 12/31/2014   Procedure: BILATERAL FEMORAL ENDARTERECTOMY;  Surgeon: Richrd Char, MD;  Location: [facility] OR;  Service: Vascular;  Laterality: Bilateral;    FEMORAL-FEMORAL BYPASS GRAFT Bilateral 12/31/2014   Procedure: FEMORAL-FEMORAL ARTERY BYPASS GRAFT;  Surgeon: Richrd Char, MD;  Location: [facility] OR;  Service: Vascular;  Laterality: Bilateral;   KYPHOPLASTY  02/29/2012   Procedure: KYPHOPLASTY;  Surgeon: Estevan Helper, MD;  Location: [facility] OR;  Service: Orthopedics;  Laterality: Bilateral;  T 10 kyphoplasty   LAPAROTOMY N/A 08/02/2014   Procedure: EXPLORATORY LAPAROTOMY LYSIS OF ADHESIONS;  Surgeon: Dareen Ebbing, MD;  Location: [facility] OR;  Service: General;  Laterality: N/A;   MELANOMA EXCISION     scalp   PERIPHERAL VASCULAR CATHETERIZATION N/A 12/05/2014   Procedure: Abdominal Aortogram;  Surgeon: Richrd Char, MD;  Location: [facility] INVASIVE CV LAB;  Service: Cardiovascular;  Laterality: N/A;   ROTATOR CUFF REPAIR Right    TONSILLECTOMY     Family History  Problem Relation Age of Onset   Dementia Mother    Social History   Socioeconomic History   Marital status: Married    Spouse name: Not on file   Number of children: 2   Years of education: Not on file   Highest education level: Not on file  Occupational History   Occupation: insurance    Employer: RETIRED  Tobacco Use   Smoking status: Former    Current packs/day: 0.00    Average packs/day: 2.0 packs/day for 25.0 years (50.0 ttl pk-yrs)    Types: Cigarettes    Start date: 02/22/1961    Quit date: 02/22/1986    Years since quitting: 37.4   Smokeless tobacco: Never  Vaping Use   Vaping status: Never Used  Substance and Sexual Activity   Alcohol use: Yes    Alcohol/week: 6.0 standard drinks of alcohol    Types: 6 Cans of beer per week   Drug use: No   Sexual activity: Not Currently  Other Topics Concern   Not on file  Social History Narrative   Not on file   Social Drivers of Health   Financial Resource Strain: Not on file  Food Insecurity: Not on file  Transportation Needs: Not on file  Physical Activity: Not on file  Stress: Not on file  Social Connections:  Not on file  Intimate Partner Violence: Not on file    SUBJECTIVE  Review of Systems Constitutional: Patient denies any unintentional weight loss or change in strength lntegumentary: Patient denies any rashes or pruritus Cardiovascular: Patient denies chest pain or syncope Respiratory: Patient denies shortness of breath Gastrointestinal: Patient denies constipation Musculoskeletal: Patient denies muscle cramps or weakness Neurologic: Patient denies convulsions or seizures Allergic/Immunologic: Patient denies recent allergic reaction(s) Hematologic/Lymphatic: Patient denies bleeding tendencies Endocrine: Patient denies heat/cold intolerance  GU: As per HPI.  OBJECTIVE Vitals:   07/18/23 1139 07/18/23 1140  BP: 132/81 132/81  Pulse: 94 94   There is no height or weight on file to calculate BMI.  Physical Examination Constitutional: No obvious distress; patient is non-toxic appearing  Cardiovascular: No visible  lower extremity edema.  Respiratory: The patient does not have audible wheezing/stridor; respirations do not appear labored  Gastrointestinal: Abdomen non-distended Musculoskeletal: Normal ROM of UEs  Skin: No obvious rashes/open sores  Neurologic: CN 2-12 grossly intact Psychiatric: Answered questions appropriately with normal affect  Hematologic/Lymphatic/Immunologic: No obvious bruises or sites of spontaneous bleeding  UA: negative  PVR: 81 ml  ASSESSMENT Benign prostatic hyperplasia with urinary obstruction - Plan: Urinalysis, Routine w reflex microscopic, BLADDER SCAN AMB NON-IMAGING, tamsulosin  (FLOMAX ) 0.4 MG CAPS capsule  After careful review of all available data and history and physical examination, it is felt that this patient has symptomatic BPH/BOO. We discussed recommendation to start Flomax  (Tamsulosin ) 0.4 mg daily; reviewed mechanism of action and potential side effects. We agreed to plan for follow up in 4-6 weeks for recheck or sooner if needed.  Patient verbalized understanding of and agreement with current plan. All questions were answered.  PLAN Advised the following: Start Flomax  (Tamsulosin ) 0.4 mg daily. Return in about 4 weeks (around 08/15/2023) for UA, PVR, & f/u with Griselda Lederer NP.  Orders Placed This Encounter  Procedures   Urinalysis, Routine w reflex microscopic   BLADDER SCAN AMB NON-IMAGING    It has been explained that the patient is to follow regularly with their PCP in addition to all other providers involved in their care and to follow instructions provided by these respective offices. Patient advised to contact urology clinic if any urologic-pertaining questions, concerns, new symptoms or problems arise in the interim period.  There are no Patient Instructions on file for this visit.  Electronically signed by:  Lauretta Ponto, MSN, FNP-C, CUNP 07/18/2023 1:02 PM

## 2023-08-14 NOTE — Progress Notes (Signed)
 Name: [name] DOB: 24-Mar-1941 MRN: [id number]  History of Present Illness: [name] is a 83 y.o. male who presents today for follow up visit at [facility]. He is accompanied by his wife [place]. GU History includes: 1. BPH with LUTS (urinary hesitancy, slow weak urinary stream, urgency, frequency, nocturia x2-4, and occasional terminal dribbling). - Denies prior GU surgery / bladder outlet procedure(s). - No PSA results found per chart review.  At initial visit on 07/18/2023: The plan was: Start Flomax  (Tamsulosin ) 0.4 mg daily.  Today: He reports improvement of his urinary symptoms since starting Flomax  daily. States his urinary stream is stronger and he reports decreased urinary urgency, frequency, nocturia, hesitancy. Denies straining to void or sensations of incomplete emptying.    Medications: Current Outpatient Medications  Medication Sig Dispense Refill   ALPRAZolam  (XANAX ) 1 MG tablet Take 1 mg by mouth daily as needed for anxiety.     apixaban  (ELIQUIS ) 5 MG TABS tablet Take 1 tablet (5 mg total) by mouth 2 (two) times daily. 180 tablet 3   furosemide  (LASIX ) 20 MG tablet TAKE 2 TABLETS (40 MG TOTAL) BY MOUTH ON MONDAY, WEDNESDAY & FRIDAY THEN 1 TABLET ON ALL OTHER DAYS 120 tablet 2   glimepiride  (AMARYL ) 4 MG tablet Take 4 mg by mouth daily with breakfast.     iron  polysaccharides (NIFEREX) 150 MG capsule Take 150 mg by mouth at bedtime.     losartan  (COZAAR ) 50 MG tablet Take 50 mg by mouth daily.     nitroGLYCERIN  (NITROSTAT ) 0.4 MG SL tablet Place 1 tablet (0.4 mg total) under the tongue every 5 (five) minutes as needed for chest pain. 25 tablet 7   potassium chloride  (KLOR-CON ) 10 MEQ tablet Take 1 tablet (10 mEq total) by mouth daily. 90 tablet 2   tamsulosin  (FLOMAX ) 0.4 MG CAPS capsule Take 1 capsule (0.4 mg total) by mouth daily. 30 capsule 11   rosuvastatin  (CRESTOR ) 10 MG tablet Take 1 tablet (10 mg total) by mouth daily. 90 tablet 3    No current facility-administered medications for this visit.    Allergies: No Active Allergies  Past Medical History:  Diagnosis Date   Acute inferior myocardial infarction (HCC)    hx   Acute on chronic diastolic CHF (congestive heart failure), NYHA class 1 (HCC) 08/09/2014   Anemia    Atrial fibrillation (HCC)    BPH (benign prostatic hyperplasia)    CAD (coronary artery disease)    a. s/p CABG in 2005 with RIMA-OM, SVG-D1 and SVG-PDA b. 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"   Myocardial infarction (HCC) x2  1986, 2005   Osteoarthritis    IN FINGERS (03/31/2016)   PAD (peripheral artery disease) (HCC)    Type II diabetes mellitus (HCC)    Past Surgical History:  Procedure Laterality Date   CARDIAC CATHETERIZATION  12/23/2014   Procedure: Left Heart Cath and Cors/Grafts Angiography;  Surgeon: Peter M Swaziland, MD;  Location: [facility] INVASIVE CV LAB;  Service: Cardiovascular;;   CARDIOVERSION N/A 05/05/2016   Procedure: CARDIOVERSION;  Surgeon: Lenise Quince, MD;  Location: [facility] ENDOSCOPY;  Service: Cardiovascular;  Laterality: N/A;   CARDIOVERSION N/A 07/09/2019   Procedure: CARDIOVERSION;  Surgeon: Loyde Rule, MD;  Location: [facility] ENDOSCOPY;  Service: Cardiovascular;  Laterality: N/A;   CARDIOVERSION N/A 12/05/2019   Procedure: CARDIOVERSION;  Surgeon: Gerard Knight, MD;  Location: [facility] ENDO SUITE;  Service: Cardiovascular;  Laterality: N/A;   CATARACT EXTRACTION W/ INTRAOCULAR LENS  IMPLANT, BILATERAL Bilateral    CORONARY ANGIOPLASTY WITH STENT PLACEMENT     CORONARY ARTERY BYPASS GRAFT  1986; redo 2005   ; free Rima to OM, svg-diag,svg-pda   ENDARTERECTOMY FEMORAL Bilateral 12/31/2014   Procedure: BILATERAL FEMORAL ENDARTERECTOMY;  Surgeon: Richrd Char, MD;  Location: [facility] OR;  Service: Vascular;   Laterality: Bilateral;   FEMORAL-FEMORAL BYPASS GRAFT Bilateral 12/31/2014   Procedure: FEMORAL-FEMORAL ARTERY BYPASS GRAFT;  Surgeon: Richrd Char, MD;  Location: [facility] OR;  Service: Vascular;  Laterality: Bilateral;   KYPHOPLASTY  02/29/2012   Procedure: KYPHOPLASTY;  Surgeon: Estevan Helper, MD;  Location: [facility] OR;  Service: Orthopedics;  Laterality: Bilateral;  T 10 kyphoplasty   LAPAROTOMY N/A 08/02/2014   Procedure: EXPLORATORY LAPAROTOMY LYSIS OF ADHESIONS;  Surgeon: Dareen Ebbing, MD;  Location: [facility] OR;  Service: General;  Laterality: N/A;   MELANOMA EXCISION     scalp   PERIPHERAL VASCULAR CATHETERIZATION N/A 12/05/2014   Procedure: Abdominal Aortogram;  Surgeon: Richrd Char, MD;  Location: [facility] INVASIVE CV LAB;  Service: Cardiovascular;  Laterality: N/A;   ROTATOR CUFF REPAIR Right    TONSILLECTOMY     Family History  Problem Relation Age of Onset   Dementia Mother    Social History   Socioeconomic History   Marital status: Married    Spouse name: Not on file   Number of children: 2   Years of education: Not on file   Highest education level: Not on file  Occupational History   Occupation: insurance    Employer: RETIRED  Tobacco Use   Smoking status: Former    Current packs/day: 0.00    Average packs/day: 2.0 packs/day for 25.0 years (50.0 ttl pk-yrs)    Types: Cigarettes    Start date: 02/22/1961    Quit date: 02/22/1986    Years since quitting: 37.5   Smokeless tobacco: Never  Vaping Use   Vaping status: Never Used  Substance and Sexual Activity   Alcohol use: Yes    Alcohol/week: 6.0 standard drinks of alcohol    Types: 6 Cans of beer per week   Drug use: No   Sexual activity: Not Currently  Other Topics Concern   Not on file  Social History Narrative   Not on file   Social Drivers of Health   Financial Resource Strain: Not on file  Food Insecurity: Not on file  Transportation Needs: Not on file  Physical Activity: Not on file  Stress: Not on  file  Social Connections: Not on file  Intimate Partner Violence: Not on file    Review of Systems Constitutional: Patient denies any unintentional weight loss or change in strength lntegumentary: Patient denies any rashes or pruritus Cardiovascular: Patient denies chest pain or syncope Respiratory: Patient denies shortness of breath Musculoskeletal: Patient denies muscle cramps or weakness Neurologic: Patient denies convulsions or seizures Allergic/Immunologic: Patient denies recent allergic reaction(s) Hematologic/Lymphatic: Patient denies bleeding tendencies Endocrine: Patient denies heat/cold intolerance  GU: As per HPI.  OBJECTIVE Vitals:   08/15/23 1159  BP: (!) 152/106  Pulse: 88   There is no height or weight on file to calculate BMI.  Physical Examination Constitutional: No obvious distress; patient is non-toxic appearing  Cardiovascular: No visible lower extremity edema.  Respiratory: The  patient does not have audible wheezing/stridor; respirations do not appear labored  Gastrointestinal: Abdomen non-distended Musculoskeletal: Normal ROM of UEs  Skin: No obvious rashes/open sores  Neurologic: CN 2-12 grossly intact Psychiatric: Answered questions appropriately with normal affect  Hematologic/Lymphatic/Immunologic: No obvious bruises or sites of spontaneous bleeding  UA: negative  PVR: 39 ml  ASSESSMENT Benign prostatic hyperplasia with urinary obstruction - Plan: Urinalysis, Routine w reflex microscopic, BLADDER SCAN AMB NON-IMAGING  His urinary symptoms and PVR are improved on Flomax  (Tamsulosin ) 0.4 mg daily, however he is interested in trying Flomax  (Tamsulosin ) 0.4 mg twice per day to see if further optimization is possible. We agreed to trial that and will plan for follow up in 4 weeks for recheck. For elevated blood pressure he was advised to consult with his PCP. Patient verbalized understanding of and agreement with current plan. All questions were  answered.  PLAN Advised the following: 1. Flomax  (Tamsulosin ) 0.4 mg two times per day.  2. Return in about 4 weeks (around 09/12/2023) for UA, PVR, & f/u with Griselda Lederer NP.  Orders Placed This Encounter  Procedures   Urinalysis, Routine w reflex microscopic   BLADDER SCAN AMB NON-IMAGING    It has been explained that the patient is to follow regularly with their PCP in addition to all other providers involved in their care and to follow instructions provided by these respective offices. Patient advised to contact urology clinic if any urologic-pertaining questions, concerns, new symptoms or problems arise in the interim period.  There are no Patient Instructions on file for this visit.  Electronically signed by:  Lauretta Ponto, FNP   08/15/23    12:22 PM

## 2023-08-15 ENCOUNTER — Ambulatory Visit: Admitting: Urology

## 2023-08-15 ENCOUNTER — Encounter: Payer: Self-pay | Admitting: Urology

## 2023-08-15 VITALS — BP 152/106 | HR 88

## 2023-08-15 DIAGNOSIS — N138 Other obstructive and reflux uropathy: Secondary | ICD-10-CM

## 2023-08-15 DIAGNOSIS — N401 Enlarged prostate with lower urinary tract symptoms: Secondary | ICD-10-CM

## 2023-08-15 LAB — URINALYSIS, ROUTINE W REFLEX MICROSCOPIC
Bilirubin, UA: NEGATIVE
Glucose, UA: NEGATIVE
Ketones, UA: NEGATIVE
Leukocytes,UA: NEGATIVE
Nitrite, UA: NEGATIVE
Protein,UA: NEGATIVE
RBC, UA: NEGATIVE
Specific Gravity, UA: 1.01 (ref 1.005–1.030)
Urobilinogen, Ur: 1 mg/dL (ref 0.2–1.0)
pH, UA: 6 (ref 5.0–7.5)

## 2023-08-15 LAB — BLADDER SCAN AMB NON-IMAGING: Scan Result: 39

## 2023-09-04 DIAGNOSIS — E785 Hyperlipidemia, unspecified: Secondary | ICD-10-CM | POA: Diagnosis not present

## 2023-09-04 DIAGNOSIS — R198 Other specified symptoms and signs involving the digestive system and abdomen: Secondary | ICD-10-CM | POA: Diagnosis not present

## 2023-09-04 DIAGNOSIS — E1142 Type 2 diabetes mellitus with diabetic polyneuropathy: Secondary | ICD-10-CM | POA: Diagnosis not present

## 2023-09-04 DIAGNOSIS — I10 Essential (primary) hypertension: Secondary | ICD-10-CM | POA: Diagnosis not present

## 2023-09-19 NOTE — Progress Notes (Unsigned)
 Name: [name] DOB: 10-20-1940 MRN: [id number]  History of Present Illness: [name] is a 83 y.o. male who presents today for follow up visit at [facility].  Relevant History includes: 1. BPH with LUTS (urinary hesitancy, slow weak urinary stream, urgency, frequency, nocturia x2-4, and occasional terminal dribbling). - Denies prior GU surgery / bladder outlet procedure(s). - No PSA results found per chart review.    At last visit on 08/15/2023: Urinary symptoms improved on Flomax  0.4 mg daily, however he is interested in trying Flomax  (Tamsulosin ) 0.4 mg twice per day to see if further optimization is possible. We agreed to trial that and will plan for follow up in 4 weeks for recheck.  Today: He reports that his urinary symptoms are even better with taking Flomax  twice per day. He is now able to void only 1x/night on average compared to up to 4x/night previously. He reports adequately strong urinary stream, still having some terminal dribbling. Denies bothersome urgency, frequency, hesitancy, straining to void, or sensations of incomplete emptying.   Medications: Current Outpatient Medications  Medication Sig Dispense Refill   ALPRAZolam  (XANAX ) 1 MG tablet Take 1 mg by mouth daily as needed for anxiety.     apixaban  (ELIQUIS ) 5 MG TABS tablet Take 1 tablet (5 mg total) by mouth 2 (two) times daily. 180 tablet 3   furosemide  (LASIX ) 20 MG tablet TAKE 2 TABLETS (40 MG TOTAL) BY MOUTH ON MONDAY, WEDNESDAY & FRIDAY THEN 1 TABLET ON ALL OTHER DAYS 120 tablet 2   glimepiride  (AMARYL ) 4 MG tablet Take 4 mg by mouth daily with breakfast.     iron  polysaccharides (NIFEREX) 150 MG capsule Take 150 mg by mouth at bedtime.     losartan  (COZAAR ) 50 MG tablet Take 50 mg by mouth daily.     nitroGLYCERIN  (NITROSTAT ) 0.4 MG SL tablet Place 1 tablet (0.4 mg total) under the tongue every 5 (five) minutes as needed for chest pain. 25 tablet 7   potassium chloride  (KLOR-CON ) 10  MEQ tablet Take 1 tablet (10 mEq total) by mouth daily. 90 tablet 2   rosuvastatin  (CRESTOR ) 10 MG tablet Take 1 tablet (10 mg total) by mouth daily. (Patient not taking: Reported on 09/20/2023) 90 tablet 3   tamsulosin  (FLOMAX ) 0.4 MG CAPS capsule Take 1 capsule (0.4 mg total) by mouth in the morning and at bedtime. 180 capsule 3   No current facility-administered medications for this visit.    Allergies: No Active Allergies  Past Medical History:  Diagnosis Date   Acute inferior myocardial infarction (HCC)    hx   Acute on chronic diastolic CHF (congestive heart failure), NYHA class 1 (HCC) 08/09/2014   Anemia    Atrial fibrillation (HCC)    BPH (benign prostatic hyperplasia)    CAD (coronary artery disease)    a. s/p CABG in 2005 with RIMA-OM, SVG-D1 and SVG-PDA b. 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   Myocardial infarction (HCC) x2  1986, 2005   Osteoarthritis    IN FINGERS (03/31/2016)   PAD (peripheral artery disease) (HCC)    Type II diabetes mellitus (HCC)    Past Surgical History:  Procedure Laterality Date   CARDIAC CATHETERIZATION  12/23/2014   Procedure: Left Heart Cath and  Cors/Grafts Angiography;  Surgeon: Peter M Swaziland, MD;  Location: [facility] INVASIVE CV LAB;  Service: Cardiovascular;;   CARDIOVERSION N/A 05/05/2016   Procedure: CARDIOVERSION;  Surgeon: Redell GORMAN Shallow, MD;  Location: [facility] ENDOSCOPY;  Service: Cardiovascular;  Laterality: N/A;   CARDIOVERSION N/A 07/09/2019   Procedure: CARDIOVERSION;  Surgeon: Delford Maude BROCKS, MD;  Location: [facility] ENDOSCOPY;  Service: Cardiovascular;  Laterality: N/A;   CARDIOVERSION N/A 12/05/2019   Procedure: CARDIOVERSION;  Surgeon: Debera Jayson MATSU, MD;  Location: [facility] ENDO SUITE;  Service: Cardiovascular;  Laterality: N/A;   CATARACT EXTRACTION W/ INTRAOCULAR LENS   IMPLANT, BILATERAL Bilateral    CORONARY ANGIOPLASTY WITH STENT PLACEMENT     CORONARY ARTERY BYPASS GRAFT  1986; redo 2005   ; free Rima to OM, svg-diag,svg-pda   ENDARTERECTOMY FEMORAL Bilateral 12/31/2014   Procedure: BILATERAL FEMORAL ENDARTERECTOMY;  Surgeon: Carlin FORBES Haddock, MD;  Location: [facility] OR;  Service: Vascular;  Laterality: Bilateral;   FEMORAL-FEMORAL BYPASS GRAFT Bilateral 12/31/2014   Procedure: FEMORAL-FEMORAL ARTERY BYPASS GRAFT;  Surgeon: Carlin FORBES Haddock, MD;  Location: [facility] OR;  Service: Vascular;  Laterality: Bilateral;   KYPHOPLASTY  02/29/2012   Procedure: KYPHOPLASTY;  Surgeon: Oneil Rodgers Priestly, MD;  Location: [facility] OR;  Service: Orthopedics;  Laterality: Bilateral;  T 10 kyphoplasty   LAPAROTOMY N/A 08/02/2014   Procedure: EXPLORATORY LAPAROTOMY LYSIS OF ADHESIONS;  Surgeon: Donnice Lima, MD;  Location: [facility] OR;  Service: General;  Laterality: N/A;   MELANOMA EXCISION     scalp   PERIPHERAL VASCULAR CATHETERIZATION N/A 12/05/2014   Procedure: Abdominal Aortogram;  Surgeon: Carlin FORBES Haddock, MD;  Location: [facility] INVASIVE CV LAB;  Service: Cardiovascular;  Laterality: N/A;   ROTATOR CUFF REPAIR Right    TONSILLECTOMY     Family History  Problem Relation Age of Onset   Dementia Mother    Social History   Socioeconomic History   Marital status: Married    Spouse name: Not on file   Number of children: 2   Years of education: Not on file   Highest education level: Not on file  Occupational History   Occupation: insurance    Employer: RETIRED  Tobacco Use   Smoking status: Former    Current packs/day: 0.00    Average packs/day: 2.0 packs/day for 25.0 years (50.0 ttl pk-yrs)    Types: Cigarettes    Start date: 02/22/1961    Quit date: 02/22/1986    Years since quitting: 37.6   Smokeless tobacco: Never  Vaping Use   Vaping status: Never Used  Substance and Sexual Activity   Alcohol use: Yes    Alcohol/week: 6.0 standard drinks of alcohol    Types: 6 Cans of beer  per week   Drug use: No   Sexual activity: Not Currently  Other Topics Concern   Not on file  Social History Narrative   Not on file   Social Drivers of Health   Financial Resource Strain: Not on file  Food Insecurity: Not on file  Transportation Needs: Not on file  Physical Activity: Not on file  Stress: Not on file  Social Connections: Not on file  Intimate Partner Violence: Not on file    Review of Systems Constitutional: Patient denies any unintentional weight loss or change in strength lntegumentary: Patient denies any rashes or pruritus Cardiovascular: Patient denies chest pain or syncope Respiratory: Patient denies shortness of breath Gastrointestinal: Patient denies nausea, vomiting, constipation, or diarrhea  Musculoskeletal: Patient denies muscle cramps or weakness Neurologic: Patient denies convulsions  or seizures Allergic/Immunologic: Patient denies recent allergic reaction(s) Hematologic/Lymphatic: Patient denies bleeding tendencies Endocrine: Patient denies heat/cold intolerance  GU: As per HPI.  OBJECTIVE Vitals:   09/20/23 1311  BP: (!) 118/50  Pulse: (!) 47   There is no height or weight on file to calculate BMI.  Physical Examination Constitutional: No obvious distress; patient is non-toxic appearing  Cardiovascular: No visible lower extremity edema.  Respiratory: The patient does not have audible wheezing/stridor; respirations do not appear labored  Gastrointestinal: Abdomen non-distended Musculoskeletal: Normal ROM of UEs  Skin: No obvious rashes/open sores  Neurologic: CN 2-12 grossly intact Psychiatric: Answered questions appropriately with normal affect  Hematologic/Lymphatic/Immunologic: No obvious bruises or sites of spontaneous bleeding  UA: did not void PVR: 134 ml  ASSESSMENT Benign prostatic hyperplasia with urinary obstruction - Plan: Urinalysis, Routine w reflex microscopic, BLADDER SCAN AMB NON-IMAGING, tamsulosin  (FLOMAX ) 0.4 MG  CAPS capsule  He is doing well on Flomax  0.4 mg twice daily and requests to continue that; refills sent. Discussed double voiding for terminal dribbling. We agreed to plan for follow up in 6 months or sooner if needed. Patient verbalized understanding of and agreement with current plan. All questions were answered.  PLAN Advised the following: 1. Continue Flomax  0.4 mg twice daily.  2. Return in about 6 months (around 03/21/2024) for BPH, with UA & PVR.  Orders Placed This Encounter  Procedures   Urinalysis, Routine w reflex microscopic   BLADDER SCAN AMB NON-IMAGING    It has been explained that the patient is to follow regularly with their PCP in addition to all other providers involved in their care and to follow instructions provided by these respective offices. Patient advised to contact urology clinic if any urologic-pertaining questions, concerns, new symptoms or problems arise in the interim period.  There are no Patient Instructions on file for this visit.  Electronically signed by:  Lauraine JAYSON Oz, FNP   09/20/23    1:34 PM

## 2023-09-20 ENCOUNTER — Ambulatory Visit: Admitting: Urology

## 2023-09-20 ENCOUNTER — Encounter: Payer: Self-pay | Admitting: Urology

## 2023-09-20 VITALS — BP 118/50 | HR 47

## 2023-09-20 DIAGNOSIS — N401 Enlarged prostate with lower urinary tract symptoms: Secondary | ICD-10-CM

## 2023-09-20 DIAGNOSIS — N138 Other obstructive and reflux uropathy: Secondary | ICD-10-CM | POA: Diagnosis not present

## 2023-09-20 MED ORDER — TAMSULOSIN HCL 0.4 MG PO CAPS: 0.4000 mg | ORAL_CAPSULE | Freq: Two times a day (BID) | ORAL | 3 refills | Status: AC

## 2023-09-20 NOTE — Progress Notes (Signed)
 Bladder Scan completed today.  Patient can not void prior to the bladder scan. Bladder scan result: 134  Performed By: Exie DASEN. CMA

## 2023-10-31 ENCOUNTER — Other Ambulatory Visit: Payer: Self-pay

## 2023-10-31 MED ORDER — POTASSIUM CHLORIDE ER 10 MEQ PO TBCR: 10.0000 meq | EXTENDED_RELEASE_TABLET | Freq: Every day | ORAL | 2 refills | Status: AC

## 2023-11-09 ENCOUNTER — Other Ambulatory Visit: Payer: Self-pay | Admitting: General Surgery

## 2023-11-09 ENCOUNTER — Telehealth: Payer: Self-pay

## 2023-11-09 DIAGNOSIS — K432 Incisional hernia without obstruction or gangrene: Secondary | ICD-10-CM

## 2023-11-09 DIAGNOSIS — Z7902 Long term (current) use of antithrombotics/antiplatelets: Secondary | ICD-10-CM | POA: Diagnosis not present

## 2023-11-09 DIAGNOSIS — Z01818 Encounter for other preprocedural examination: Secondary | ICD-10-CM

## 2023-11-09 DIAGNOSIS — E1169 Type 2 diabetes mellitus with other specified complication: Secondary | ICD-10-CM | POA: Diagnosis not present

## 2023-11-09 DIAGNOSIS — I480 Paroxysmal atrial fibrillation: Secondary | ICD-10-CM | POA: Diagnosis not present

## 2023-11-09 DIAGNOSIS — Z951 Presence of aortocoronary bypass graft: Secondary | ICD-10-CM | POA: Diagnosis not present

## 2023-11-09 NOTE — Telephone Encounter (Signed)
   Pre-operative Risk Assessment    Patient Name: [name]  DOB: 12/08/1940 MRN: [id number]   Date of last office visit: 06/16/23 PETER SWAZILAND, MD Date of next office visit: 12/14/23 PETER SWAZILAND, MD   Request for Surgical Clearance    Procedure:  HERNIA REPAIR SURGERY  Date of Surgery:  Clearance TBD                                Surgeon:  CAMELLIA BLUSH, MD Surgeon's Group or Practice Name:  [facility] Phone number:  [phone] Fax number:  [phone]  ATTN: LEITA CORONA, CMA   Type of Clearance Requested:   - Medical  - Pharmacy:  Hold Apixaban  (Eliquis )     Type of Anesthesia:  General    Additional requests/questions:    Signed, Lucie DELENA Ku   11/09/2023, 5:10 PM

## 2023-11-10 NOTE — Telephone Encounter (Signed)
 I will forward back to preop for further recommendations if the pt will require an appt in office or tele preop appt after labs have been done. Appt if needed will be schedule in ample time after labs have been done to allow time for the results to come before the appt.

## 2023-11-10 NOTE — Telephone Encounter (Signed)
 Patient needs CBC and BMET for pharmacy clearance please order. Thank you.  KL

## 2023-11-13 NOTE — Telephone Encounter (Signed)
 Tried to call the pt to set up labs needed for preop and schedule a tele preop appt as well. Line was busy.

## 2023-11-15 ENCOUNTER — Ambulatory Visit
Admission: RE | Admit: 2023-11-15 | Discharge: 2023-11-15 | Disposition: A | Discharge status: Home / Self Care | Source: Ambulatory Visit | Attending: General Surgery | Admitting: General Surgery

## 2023-11-15 DIAGNOSIS — K439 Ventral hernia without obstruction or gangrene: Secondary | ICD-10-CM | POA: Diagnosis not present

## 2023-11-15 DIAGNOSIS — K4020 Bilateral inguinal hernia, without obstruction or gangrene, not specified as recurrent: Secondary | ICD-10-CM | POA: Diagnosis not present

## 2023-11-15 DIAGNOSIS — K432 Incisional hernia without obstruction or gangrene: Secondary | ICD-10-CM

## 2023-11-15 DIAGNOSIS — K8020 Calculus of gallbladder without cholecystitis without obstruction: Secondary | ICD-10-CM | POA: Diagnosis not present

## 2023-11-15 MED ORDER — IOPAMIDOL (ISOVUE-300) INJECTION 61%
80.0000 mL | Freq: Once | INTRAVENOUS | Status: AC | PRN
  Administered 2023-11-15: 80 mL via INTRAVENOUS

## 2023-11-29 NOTE — Telephone Encounter (Signed)
 BMET/CBC orders have been placed and release for [facility].

## 2023-11-29 NOTE — Addendum Note (Signed)
 Addended by: Allyanna Appleman M on: 11/29/2023 02:49 PM   Modules accepted: Orders

## 2023-11-29 NOTE — Telephone Encounter (Signed)
 I will update all parties of upcoming appt 12/14/23 with Dr. Swaziland.

## 2023-11-29 NOTE — Telephone Encounter (Signed)
 I sent a secure chat to preop APP Josefa Beauvais, FNP: just RICK pt has in office appt 9/18 with Swaziland , we have tried to reach the pt to set up labs and tele , but he has not called back. Proc is TBD. do you think pt can see MD for preop and I can see if he can come in sooner for the labs to be ready for in office appt?   Josefa Beauvais, FNP: yes, that sounds good to me   I will call the pt and see if he can come in and get labs done in time for the in office appt with Dr. Swaziland 12/14/23. Left message for pt to call back to set up labs about 1 week sooner before his appt 12/14/23 with Dr. Swaziland.

## 2023-11-29 NOTE — Telephone Encounter (Signed)
 Pt aware to go get labs done prior to his appt 9/18 (at least 3-4 days prior). Per secure chat, labs will be placed today. He states he will report to either [address] or [place] closer for him.

## 2023-11-29 NOTE — Telephone Encounter (Signed)
 Niels can you please follow up with the patient to complete lab work. Once completed they will need telephone appt. Needs CBC and BMET per pharmacy.

## 2023-12-04 ENCOUNTER — Other Ambulatory Visit (HOSPITAL_COMMUNITY)
Admission: RE | Admit: 2023-12-04 | Discharge: 2023-12-04 | Disposition: A | Discharge status: Home / Self Care | Source: Ambulatory Visit | Attending: Cardiology | Admitting: Cardiology

## 2023-12-04 ENCOUNTER — Ambulatory Visit: Payer: Self-pay | Admitting: Pharmacist Clinician (PhC)/ Clinical Pharmacy Specialist

## 2023-12-04 DIAGNOSIS — Z01818 Encounter for other preprocedural examination: Secondary | ICD-10-CM | POA: Insufficient documentation

## 2023-12-04 LAB — CBC
HCT: 37.4 % — ABNORMAL LOW (ref 39.0–52.0)
Hemoglobin: 13.1 g/dL (ref 13.0–17.0)
MCH: 32.9 pg (ref 26.0–34.0)
MCHC: 35 g/dL (ref 30.0–36.0)
MCV: 94 fL (ref 80.0–100.0)
Platelets: 125 K/uL — ABNORMAL LOW (ref 150–400)
RBC: 3.98 MIL/uL — ABNORMAL LOW (ref 4.22–5.81)
RDW: 12.4 % (ref 11.5–15.5)
WBC: 6.9 K/uL (ref 4.0–10.5)
nRBC: 0 % (ref 0.0–0.2)

## 2023-12-04 LAB — BASIC METABOLIC PANEL WITH GFR
Anion gap: 13 (ref 5–15)
BUN: 18 mg/dL (ref 8–23)
CO2: 21 mmol/L — ABNORMAL LOW (ref 22–32)
Calcium: 9.3 mg/dL (ref 8.9–10.3)
Chloride: 102 mmol/L (ref 98–111)
Creatinine, Ser: 1.07 mg/dL (ref 0.61–1.24)
GFR, Estimated: >60 mL/min (ref >60)
Glucose, Bld: 165 mg/dL — ABNORMAL HIGH (ref 70–99)
Potassium: 3.8 mmol/L (ref 3.5–5.1)
Sodium: 136 mmol/L (ref 135–145)

## 2023-12-04 NOTE — Telephone Encounter (Signed)
 Patient with diagnosis of atrial fibrillation on Eliquis  for anticoagulation.    Procedure:  HERNIA REPAIR SURGERY   Date of Surgery:  Clearance TBD      CHA2DS2-VASc Score = 6   This indicates a 9.7% annual risk of stroke. The patient's score is based upon: CHF History: 1 HTN History: 1 Diabetes History: 1 Stroke History: 0 Vascular Disease History: 1 Age Score: 2 Gender Score: 0    CrCl 57 Platelet count 123  Patient has not had an Afib/aflutter ablation or Watchman within the last 3 months or DCCV within the last 30 days   Per office protocol, patient can hold Eliquis  for 2 days prior to procedure.   Patient will not need bridging with Lovenox  (enoxaparin ) around procedure.  **This guidance is not considered finalized until pre-operative APP has relayed final recommendations.**

## 2023-12-08 ENCOUNTER — Other Ambulatory Visit: Payer: Self-pay | Admitting: Cardiology

## 2023-12-08 DIAGNOSIS — I480 Paroxysmal atrial fibrillation: Secondary | ICD-10-CM

## 2023-12-08 NOTE — Progress Notes (Signed)
 [name] Date of Birth: [birth year] Medical Record #[id number]  History of Present Illness: [name] is seen for follow up Afib and CAD.  He has a history of coronary disease and is status post redo coronary bypass surgery in 2005 after emergent stenting of the left main. This included a free RIMA graft to the OM, SVG to diagonal, and SVG to PDA. LIMA to LAD was intact from original surgery. He has had an old anterior myocardial infarction.    In April 2016 he was admitted with SBO and underwent surgery with exploratory lap and lysis of adhesions. His post op course was complicated by marked volume overload due to IVF with over 20 lb weight gain. He was diuresed. Echo showed EF 45%.   In August 2016 he presented with progressive claudication in the left hip and leg. Dopplers showed severe PAD. Angiography was done by Dr. Harvey and showed severe left iliac and bilateral common femoral disease. He had an abnormal Myoview study and cardiac cath was done and showed patent grafts.  He underwent surgery by Dr. Harvey with bilateral femoral endarterectomy and fem- fem BPG.   Admit 01/04-01/07/18 w/ URI, CHF, new dx atrial fib. Rate control w/ Coreg  & dig. Bradycardia w/ Cardizem . Started on Eliquis  and Plavix  discontinued. TSH nl, EF 40-45% CHA2DS2VASc=6 (age x 2, HTN, DM, CAD, CHF). He was anticoagulated for 4 weeks and underwent DCCV on 05/05/16.   Was seen on 06/25/2019 and was noted to be in atrial fibrillation with slow ventricular response.  His furosemide  was increased to 40 mg Monday Wednesday Friday and he was instructed to take 20 mg on the other days his Lanoxin  was stopped.  he underwent DCCV on  07/09/2019. Unfortunately he had early recurrence of afib. He was seen by Dr Waddell in June. Discussed AAD therapy with Tikosyn versus amiodarone . Not felt to be a candidate for ablation due to age and multiple co morbidities. Patient decided to try amiodarone  and was started on po dose 200 mg bid.  He underwent DCCV on 12/05/19.   LE arterial dopplers in April 2022 showed noncompressible vessels. > 50% stenosis in femoral graft.  Carotid dopplers showed 40-59% RICA stenosis. 1-39% on LICA. Now followed by Dr Magda. Was seen in April and there were elevated velocities noted. Subsequent CT scan done looked Ok.   When seen in February it appeared he was  in Afib with a slow response so we stopped his amiodarone . He later went back into Afib with controlled rate. No symptoms.   He reports he had a 35 lb weight loss. Was having hypoglycemic spells. Actos  and glimiperide were stopped. He later stopped Jardiance  as well due to weight loss and cost. He is back on glimepiride  only. He has gained some weight back.   He has seen general surgery for consideration of an incisional hernia repair. This is really asymptomatic. CT showed hernias to be fat filled.   He currently denies any increase chest pain, dyspnea, palpitation. No TIA or stroke symptoms. He is very sedentary mostly watching TV.    Current Outpatient Medications on File Prior to Visit  Medication Sig Dispense Refill   ALPRAZolam  (XANAX ) 1 MG tablet Take 1 mg by mouth daily as needed for anxiety.     apixaban  (ELIQUIS ) 5 MG TABS tablet TAKE 1 TABLET BY MOUTH 2 TIMES A DAY 180 tablet 2   furosemide  (LASIX ) 20 MG tablet TAKE 2 TABLETS (40 MG TOTAL) BY MOUTH ON MONDAY, WEDNESDAY &  FRIDAY THEN 1 TABLET ON ALL OTHER DAYS 120 tablet 2   glimepiride  (AMARYL ) 4 MG tablet Take 4 mg by mouth daily with breakfast.     iron  polysaccharides (NIFEREX) 150 MG capsule Take 150 mg by mouth at bedtime.     losartan  (COZAAR ) 50 MG tablet Take 50 mg by mouth daily.     nitroGLYCERIN  (NITROSTAT ) 0.4 MG SL tablet Place 1 tablet (0.4 mg total) under the tongue every 5 (five) minutes as needed for chest pain. 75 tablet 1   potassium chloride  (KLOR-CON ) 10 MEQ tablet Take 1 tablet (10 mEq total) by mouth daily. 90 tablet 2   rosuvastatin  (CRESTOR ) 10 MG tablet  Take 1 tablet (10 mg total) by mouth daily. 90 tablet 3   tamsulosin  (FLOMAX ) 0.4 MG CAPS capsule Take 1 capsule (0.4 mg total) by mouth in the morning and at bedtime. 180 capsule 3   Nutritional Supplements (GLUCERNA 1.0 CAL) LIQD in the morning.     No current facility-administered medications on file prior to visit.    No Known Allergies   Past Medical History:  Diagnosis Date   Acute inferior myocardial infarction (HCC)    hx   Acute on chronic diastolic CHF (congestive heart failure), NYHA class 1 (HCC) 08/09/2014   Anemia    Atrial fibrillation (HCC)    BPH (benign prostatic hyperplasia)    CAD (coronary artery disease)    a. s/p CABG in 2005 with RIMA-OM, SVG-D1 and SVG-PDA b. 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   Myocardial infarction (HCC) x2  1986, 2005   Osteoarthritis    IN FINGERS (03/31/2016)   PAD (peripheral artery disease) (HCC)    Type II diabetes mellitus (HCC)     Past Surgical History:  Procedure Laterality Date   CARDIAC CATHETERIZATION  12/23/2014   Procedure: Left Heart Cath and Cors/Grafts Angiography;  Surgeon: Genesia Caslin M Swaziland, MD;  Location: [facility] INVASIVE CV LAB;  Service: Cardiovascular;;   CARDIOVERSION N/A 05/05/2016   Procedure: CARDIOVERSION;  Surgeon: Redell GORMAN Shallow, MD;  Location: [facility] ENDOSCOPY;  Service: Cardiovascular;  Laterality: N/A;   CARDIOVERSION N/A 07/09/2019   Procedure: CARDIOVERSION;  Surgeon: Delford Maude BROCKS, MD;  Location: [facility] ENDOSCOPY;  Service: Cardiovascular;  Laterality: N/A;   CARDIOVERSION N/A 12/05/2019   Procedure: CARDIOVERSION;  Surgeon: Debera Jayson MATSU, MD;  Location: [facility] ENDO SUITE;  Service: Cardiovascular;  Laterality: N/A;   CATARACT EXTRACTION W/ INTRAOCULAR LENS  IMPLANT, BILATERAL Bilateral    CORONARY ANGIOPLASTY WITH STENT PLACEMENT      CORONARY ARTERY BYPASS GRAFT  1986; redo 2005   ; free Rima to OM, svg-diag,svg-pda   ENDARTERECTOMY FEMORAL Bilateral 12/31/2014   Procedure: BILATERAL FEMORAL ENDARTERECTOMY;  Surgeon: Carlin FORBES Haddock, MD;  Location[facility] OR;  Service: Vascular;  Laterality: Bilateral;   FEMORAL-FEMORAL BYPASS GRAFT Bilateral 12/31/2014   Procedure: FEMORAL-FEMORAL ARTERY BYPASS GRAFT;  Surgeon: Carlin FORBES Haddock, MD;  Location[facility];  Service: Vascular;  Laterality: Bilateral;   KYPHOPLASTY  02/29/2012   Procedure: KYPHOPLASTY;  Surgeon: Oneil Rodgers Priestly, MD;  Location: [facility] OR;  Service: Orthopedics;  Laterality: Bilateral;  T 10 kyphoplasty   LAPAROTOMY N/A 08/02/2014   Procedure: EXPLORATORY LAPAROTOMY LYSIS OF ADHESIONS;  Surgeon: Donnice Lima, MD;  Location: [facility] OR;  Service: General;  Laterality: N/A;   MELANOMA EXCISION     scalp   PERIPHERAL VASCULAR CATHETERIZATION N/A 12/05/2014   Procedure: Abdominal Aortogram;  Surgeon: Carlin FORBES Haddock, MD;  Location: [facility] INVASIVE CV LAB;  Service: Cardiovascular;  Laterality: N/A;   ROTATOR CUFF REPAIR Right    TONSILLECTOMY      Social History   Tobacco Use  Smoking Status Former   Current packs/day: 0.00   Average packs/day: 2.0 packs/day for 25.0 years (50.0 ttl pk-yrs)   Types: Cigarettes   Start date: 02/22/1961   Quit date: 02/22/1986   Years since quitting: 37.8  Smokeless Tobacco Never    Social History   Substance and Sexual Activity  Alcohol Use Yes   Alcohol/week: 6.0 standard drinks of alcohol   Types: 6 Cans of beer per week    Family History  Problem Relation Age of Onset   Dementia Mother     Review of Systems: As noted in history of present illness.  All other systems were reviewed and are negative.  Physical Exam: BP 110/60 (Cuff Size: Normal)   Pulse 64   Ht 5' 8 (1.727 m)   Wt 163 lb (73.9 kg)   SpO2 95%   BMI 24.78 kg/m  GENERAL:  Well appearing WM in NAD HEENT:  PERRL, EOMI, sclera are clear. Oropharynx is  clear. NECK:  No jugular venous distention, carotid upstroke brisk and symmetric, right carotid bruit, no thyromegaly or adenopathy LUNGS:  Clear to auscultation bilaterally CHEST:  Unremarkable HEART:  IRRR,  PMI not displaced or sustained,S1 and S2 within normal limits, no S3, no S4: no clicks, no rubs, no murmurs ABD:  Soft, nontender. BS +, no masses or bruits. No hepatomegaly, no splenomegaly EXT:  2 + pulses throughout, no edema, no cyanosis no clubbing SKIN:  Warm and dry.  No rashes NEURO:  Alert and oriented x 3. Cranial nerves II through XII intact. PSYCH:  Cognitively intact   LABORATORY DATA: Lab Results  Component Value Date   WBC 6.9 12/04/2023   HGB 13.1 12/04/2023   HCT 37.4 (L) 12/04/2023   PLT 125 (L) 12/04/2023   GLUCOSE 165 (H) 12/04/2023   CHOL 105 04/02/2016   TRIG 54 04/02/2016   HDL 51 04/02/2016   LDLCALC 43 04/02/2016   ALT 15 (L) 12/25/2014   AST 24 12/25/2014   NA 136 12/04/2023   K 3.8 12/04/2023   CL 102 12/04/2023   CREATININE 1.07 12/04/2023   BUN 18 12/04/2023   CO2 21 (L) 12/04/2023   TSH 1.009 03/31/2016   INR 1.9 (H) 12/05/2019   HGBA1C 6.9 (H) 12/03/2019   Dated 12/09/16: cholesterol 135, triglycerides 48, HDL 59, LDL 66. A1c 6.6%. Hgb 12. CMET and TSH normal Dated 07/11/19: A1c 6.6%. BUN 23. Creatinine 1.17. otherwise CMET normal. Cholesterol 139, triglycerides 40, HDL 78, LDL 43. Dig level 0.4 Dated 02/10/20: A1c 7.3%.  Dated 08/05/20: A1c 6.5% Dated 02/17/21: cholesterol 148, triglycerides 53, HDL 90, LDL 47. Creatinine 1.64. otherwise CMET and CBC normal. A1c 6.9% Dated 08/18/21: A1c 6.5% Dated 09/10/21: creatinine 1.4. GFR 50. Hgb 12.2. otherwise CMET, CBC, TSH normal. Dated 03/14/22: cholesterol 157, triglycerides 41, HDL 103, LDL 44. Creatinine 1.28. otherwise CMET normal Dated 09/09/22 A1c 6.6% Dated 03/16/23: A1c 7.6%, cholesterol 130, triglycerides 55, HDL 70, LDL 49.normal CBC and CMET  EKG Interpretation Date/Time:  Thursday  December 14 2023 16:32:32 EDT Ventricular Rate:  64 PR Interval:    QRS Duration:  88 QT  Interval:  428 QTC Calculation: 441 R Axis:   43  Text Interpretation: Junctional rhythm ST & T wave abnormality, consider lateral ischemia When compared with ECG of 06-Dec-2022 13:52, Junctional rhythm has replaced Atrial fibrillation Borderline criteria for Inferior infarct are no longer Present Nonspecific T wave abnormality no longer evident in Inferior leads T wave inversion less evident in Anterolateral leads Confirmed by [place], Friedrich Harriott [phone]) on 12/14/2023 4:52:27 PM   ECHO: 04/02/2016 - Left ventricle: The cavity size was mildly dilated. Wall   thickness was normal. Systolic function was mildly to moderately   reduced. The estimated ejection fraction was in the range of 40%   to 45%. There is akinesis of the inferolateral myocardium. - Mitral valve: Calcified annulus. There was mild regurgitation. - Aortic valve: Indexed valve area (VTI): 1.11 cm^2/m^2. Peak velocity ratio of LVOT to aortic valve: 0.71. Mean gradient (S): 5 mm Hg. Peak gradient (S): 9 mm Hg. - Left atrium: The atrium was moderately dilated. - Pulmonary arteries: Systolic pressure was moderately increased.   PA peak pressure: 51 mm Hg (S).  Impressions:  - Akinesis of the inferolateral wall with overall mild to moderate   LV dysfunction; mild MR; moderate LAE; mild TR; moderately   elevated pulmonary pressure.   Assessment / Plan: 1. Coronary disease status post redo CABG in 2005. Remote anterior myocardial infarction. Prior left main stent. Cardiac cath in September 2016 showed patent grafts. Class 1 angina. Continue medical therapy.   2. 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 He has stable class 1-2 symptoms. Not volume overloaded.  Continue current therapy with lasix . Patient stopped losartan  on his own due to low BP. Jardiance  discontinued due to profound weight loss.  3. Atrial fibrillation s/p DCCV on 05/05/16 and  again in 2021 with ERAD. Recurrent Afib off amiodarone - now with junctional rhythm with normal HR. Asymptomatic. ITALY Vasc score of 6. Will continue Eliquis  long term. Will continue with rate control strategy now.   4. Hyperlipidemia on Zocor . Excellent control. Last LDL 49  5. Severe PAD- S/p bilateral femoral endarterectomy and fem-fem bypass. Stressed importance of regular aerobic walking. Seen by [facility] in past  but patient really not interested in regular follow up unless he has a problem.  6. DM type 2- significant weight loss and hypoglycemia. Now off  Actos  and Jardiance .   7. HTN controlled.   8. CKD stage 3b  9. Incisional abdominal hernia. I recommend he leave this alone. Hernia is fat filled so risk of incarcerated bowel is low. He is asymptomatic. Given age and history of CAD, CHF, PAD, CKD, Afib his surgical risk is not favorable. He also has poor functional status.   I will follow up in 6 months.

## 2023-12-08 NOTE — Telephone Encounter (Signed)
 Eliquis  5mg  refill request received. Patient is 83 years old, weight-77.3kg, Crea-1.07 on 12/04/23, Diagnosis-Afib, and last seen by Dr. Swaziland on 06/16/23. Dose is appropriate based on dosing criteria. Will send in refill to requested pharmacy.

## 2023-12-11 ENCOUNTER — Other Ambulatory Visit: Payer: Self-pay | Admitting: Cardiology

## 2023-12-11 ENCOUNTER — Telehealth: Payer: Self-pay | Admitting: Cardiology

## 2023-12-11 MED ORDER — NITROGLYCERIN 0.4 MG SL SUBL: 0.4000 mg | SUBLINGUAL_TABLET | SUBLINGUAL | 1 refills | Status: DC | PRN

## 2023-12-11 NOTE — Telephone Encounter (Signed)
 Pt's medication was sent to pt's pharmacy as requested. Confirmation received.

## 2023-12-11 NOTE — Telephone Encounter (Signed)
*  STAT* If patient is at the pharmacy, call can be transferred to refill team.   1. Which medications need to be refilled? (please list name of each medication and dose if known) nitroGLYCERIN (NITROSTAT) 0.4 MG SL tablet  2. Which pharmacy/location (including street and city if local pharmacy) is medication to be sent to? [id number] - [place],  - [address]  3. Do they need a 30 day or 90 day supply? 90   

## 2023-12-14 ENCOUNTER — Ambulatory Visit: Attending: Cardiology | Admitting: Cardiology

## 2023-12-14 ENCOUNTER — Encounter: Payer: Self-pay | Admitting: Cardiology

## 2023-12-14 VITALS — BP 110/60 | HR 64 | Ht 68.0 in | Wt 163.0 lb

## 2023-12-14 DIAGNOSIS — I5042 Chronic combined systolic (congestive) and diastolic (congestive) heart failure: Secondary | ICD-10-CM | POA: Diagnosis not present

## 2023-12-14 DIAGNOSIS — I739 Peripheral vascular disease, unspecified: Secondary | ICD-10-CM

## 2023-12-14 DIAGNOSIS — I25708 Atherosclerosis of coronary artery bypass graft(s), unspecified, with other forms of angina pectoris: Secondary | ICD-10-CM | POA: Diagnosis not present

## 2023-12-14 DIAGNOSIS — I4819 Other persistent atrial fibrillation: Secondary | ICD-10-CM | POA: Diagnosis not present

## 2023-12-14 NOTE — Patient Instructions (Signed)

## 2023-12-18 ENCOUNTER — Telehealth: Payer: Self-pay | Admitting: Cardiology

## 2023-12-18 NOTE — Telephone Encounter (Signed)
 Pt c/o medication issue:  1. Name of Medication:   nitroGLYCERIN  (NITROSTAT ) 0.4 MG SL tablet    2. How are you currently taking this medication (dosage and times per day)?   Place 1 tablet (0.4 mg total) under the tongue every 5 (five) minutes as needed for chest pain.    3. Are you having a reaction (difficulty breathing--STAT)? No  4. What is your medication issue? Pt is confused about why his nitroglycerin  was filled for a 90 day supply instead of a 30 dya supply. Please advise.

## 2023-12-18 NOTE — Telephone Encounter (Signed)
 Spoke with the patient who states that he was given 3 bottles of nitroglycerin  from his pharmacy. Advised that we received a request for a 90 day supply so this is why it was sent that way. Patient verbalized understanding.

## 2023-12-21 ENCOUNTER — Telehealth: Payer: Self-pay | Admitting: Internal Medicine

## 2023-12-21 NOTE — Telephone Encounter (Signed)
 Patient had recall for 07/2023 w/Dr. Waddell, appt was cancelled in November. Per [facility], CHERYL J Pt does not need to follow-up w/Dr. Waddell. Deleting recall.

## 2024-01-01 ENCOUNTER — Other Ambulatory Visit: Payer: Self-pay | Admitting: Cardiology

## 2024-02-13 ENCOUNTER — Ambulatory Visit: Admitting: Internal Medicine

## 2024-03-03 ENCOUNTER — Other Ambulatory Visit: Payer: Self-pay | Admitting: Cardiology

## 2024-03-12 ENCOUNTER — Ambulatory Visit: Admitting: Urology

## 2024-06-12 ENCOUNTER — Ambulatory Visit: Admitting: Cardiology
